# Patient Record
Sex: Female | Born: 1950 | Race: White | Hispanic: No | Marital: Married | Smoking: Current every day smoker
Health system: Southern US, Community
[De-identification: ages and names within clinical notes are randomized; demographics above are authoritative.]

## PROBLEM LIST (undated history)

## (undated) DIAGNOSIS — I809 Phlebitis and thrombophlebitis of unspecified site: Secondary | ICD-10-CM

## (undated) DIAGNOSIS — K4090 Unilateral inguinal hernia, without obstruction or gangrene, not specified as recurrent: Secondary | ICD-10-CM

## (undated) DIAGNOSIS — J449 Chronic obstructive pulmonary disease, unspecified: Secondary | ICD-10-CM

## (undated) DIAGNOSIS — R1011 Right upper quadrant pain: Secondary | ICD-10-CM

## (undated) DIAGNOSIS — Z952 Presence of prosthetic heart valve: Secondary | ICD-10-CM

## (undated) DIAGNOSIS — F419 Anxiety disorder, unspecified: Secondary | ICD-10-CM

## (undated) DIAGNOSIS — I10 Essential (primary) hypertension: Secondary | ICD-10-CM

## (undated) DIAGNOSIS — K760 Fatty (change of) liver, not elsewhere classified: Secondary | ICD-10-CM

## (undated) DIAGNOSIS — D649 Anemia, unspecified: Secondary | ICD-10-CM

## (undated) DIAGNOSIS — R011 Cardiac murmur, unspecified: Secondary | ICD-10-CM

## (undated) DIAGNOSIS — N393 Stress incontinence (female) (male): Secondary | ICD-10-CM

## (undated) DIAGNOSIS — Z720 Tobacco use: Secondary | ICD-10-CM

## (undated) DIAGNOSIS — M1990 Unspecified osteoarthritis, unspecified site: Secondary | ICD-10-CM

## (undated) DIAGNOSIS — I350 Nonrheumatic aortic (valve) stenosis: Secondary | ICD-10-CM

## (undated) DIAGNOSIS — J309 Allergic rhinitis, unspecified: Secondary | ICD-10-CM

## (undated) DIAGNOSIS — I499 Cardiac arrhythmia, unspecified: Secondary | ICD-10-CM

## (undated) HISTORY — PX: KNEE ARTHROSCOPY: SUR90

## (undated) HISTORY — DX: Stress incontinence (female) (male): N39.3

## (undated) HISTORY — PX: LAPAROSCOPIC HYSTERECTOMY: SHX1926

## (undated) HISTORY — DX: Cardiac murmur, unspecified: R01.1

## (undated) HISTORY — DX: Unspecified osteoarthritis, unspecified site: M19.90

## (undated) HISTORY — PX: OTHER SURGICAL HISTORY: SHX169

## (undated) HISTORY — PX: FOOT SURGERY: SHX648

## (undated) HISTORY — DX: Allergic rhinitis, unspecified: J30.9

## (undated) HISTORY — DX: Phlebitis and thrombophlebitis of unspecified site: I80.9

## (undated) HISTORY — PX: VARICOSE VEIN SURGERY: SHX832

## (undated) HISTORY — PX: ABDOMINAL HYSTERECTOMY: SHX81

## (undated) HISTORY — PX: BREAST BIOPSY: SHX20

## (undated) HISTORY — PX: EYE SURGERY: SHX253

---

## 2001-09-01 ENCOUNTER — Encounter (HOSPITAL_COMMUNITY): Admission: RE | Admit: 2001-09-01 | Discharge: 2001-10-01 | Payer: Self-pay | Admitting: *Deleted

## 2001-12-05 ENCOUNTER — Encounter (HOSPITAL_COMMUNITY): Admission: RE | Admit: 2001-12-05 | Discharge: 2002-01-04 | Payer: Self-pay | Admitting: Orthopedic Surgery

## 2002-01-03 ENCOUNTER — Encounter (HOSPITAL_COMMUNITY): Admission: RE | Admit: 2002-01-03 | Discharge: 2002-02-02 | Payer: Self-pay | Admitting: Orthopedic Surgery

## 2002-02-06 ENCOUNTER — Ambulatory Visit (HOSPITAL_COMMUNITY): Admission: RE | Admit: 2002-02-06 | Discharge: 2002-02-06 | Payer: Self-pay | Admitting: Orthopedic Surgery

## 2003-12-16 ENCOUNTER — Ambulatory Visit: Payer: Self-pay | Admitting: Unknown Physician Specialty

## 2003-12-18 ENCOUNTER — Ambulatory Visit: Payer: Self-pay | Admitting: Internal Medicine

## 2004-01-13 ENCOUNTER — Other Ambulatory Visit: Payer: Self-pay

## 2004-01-21 ENCOUNTER — Ambulatory Visit: Payer: Self-pay | Admitting: Obstetrics & Gynecology

## 2015-06-11 ENCOUNTER — Ambulatory Visit (INDEPENDENT_AMBULATORY_CARE_PROVIDER_SITE_OTHER): Payer: Self-pay | Admitting: Family Medicine

## 2015-06-11 ENCOUNTER — Encounter: Payer: Self-pay | Admitting: Family Medicine

## 2015-06-11 VITALS — BP 134/86 | HR 85 | Temp 98.4°F | Ht 67.5 in | Wt 213.0 lb

## 2015-06-11 DIAGNOSIS — Z87891 Personal history of nicotine dependence: Secondary | ICD-10-CM | POA: Insufficient documentation

## 2015-06-11 DIAGNOSIS — L989 Disorder of the skin and subcutaneous tissue, unspecified: Secondary | ICD-10-CM

## 2015-06-11 DIAGNOSIS — R600 Localized edema: Secondary | ICD-10-CM

## 2015-06-11 DIAGNOSIS — R51 Headache: Secondary | ICD-10-CM

## 2015-06-11 DIAGNOSIS — R519 Headache, unspecified: Secondary | ICD-10-CM | POA: Insufficient documentation

## 2015-06-11 DIAGNOSIS — G44229 Chronic tension-type headache, not intractable: Secondary | ICD-10-CM

## 2015-06-11 DIAGNOSIS — R011 Cardiac murmur, unspecified: Secondary | ICD-10-CM

## 2015-06-11 DIAGNOSIS — Z720 Tobacco use: Secondary | ICD-10-CM

## 2015-06-11 DIAGNOSIS — C4492 Squamous cell carcinoma of skin, unspecified: Secondary | ICD-10-CM | POA: Insufficient documentation

## 2015-06-11 DIAGNOSIS — N393 Stress incontinence (female) (male): Secondary | ICD-10-CM

## 2015-06-11 DIAGNOSIS — I872 Venous insufficiency (chronic) (peripheral): Secondary | ICD-10-CM | POA: Insufficient documentation

## 2015-06-11 NOTE — Assessment & Plan Note (Signed)
Reports this is been stable. Asymptomatic. We'll request records from prior physician.

## 2015-06-11 NOTE — Assessment & Plan Note (Signed)
Lesion on left lower extremity were some for actinic keratoses. Discussed dermatology referral if she wants to defer this until she has insurance on July 1. She will return in July for reevaluation and referral.

## 2015-06-11 NOTE — Assessment & Plan Note (Signed)
Minimal issue. Discussed Kegel exercises.

## 2015-06-11 NOTE — Patient Instructions (Signed)
Nice to meet you. The swelling that you get in your legs is likely related to venous insufficiency. You should try to wear compression stockings if you can tolerate. You should also try to prop your legs up as much as possible. He should start doing Kegel exercises for your stress incontinence. Once you get insurance we will refer you to dermatology for evaluation of your skin lesion. We will also obtain lab work when you get insurance. If you develop worsening swelling, pain in her calves, worsening headaches, numbness, weakness, vision changes, or any new or changing symptoms please seek medical attention.

## 2015-06-11 NOTE — Assessment & Plan Note (Signed)
Not interested in quitting. She will cut down on her own.

## 2015-06-11 NOTE — Assessment & Plan Note (Signed)
Chronic tension-type headaches. Neurologically intact. Not worsened recently. No change in headaches. She'll continue to monitor. Given return precautions.

## 2015-06-11 NOTE — Progress Notes (Signed)
Patient ID: Donna Rios, female   DOB: 21-Jun-1950, 65 y.o.   MRN: [id number]  Donna Rumps, MD Phone: [phone]  [name] is a 65 y.o. female who presents today for new patient visit.  Tobacco abuse: Smokes about a pack a day. Has tried quitting in the past gets ulcers in her mouth when she quits. Is not interested in quitting now though is interested in cutting back. Wants to do this on her own. Has tried patches and other nicotine replacement. Not interested in Chantix.  Left lower extremity skin lesion: Patient notes there is been a dry rough patch on the left lower outer aspect of her left leg for the last 1-2 years. Has not grown. Also notes likely seborrheic keratoses on her left back. Occasionally bleeds if irritated. Does place moisturizer on it.  Notes chronic intermittent left foot and ankle swelling following having 7 surgeries on her feet and ankles. Notes goes down with propping her legs up and at night. No orthopnea or PND. No history of DVT. Does have a history of phlebitis and had surgery on her right leg for this. No calf pain or unilateral calf swelling. Does not want further workup of this at this time.  Does report an occasional tension headache or sinus headache. Has had these for many years. No issues since she was recently treated for an upper respiratory infection. Typically gets them late summer due to the heat. No numbness, weakness, or vision changes.  Does report stress incontinence with coughing. Also had recent vaginal discharge relating to antibiotic use and was treated with Diflucan. No vaginal discharge at this time.  Patient additionally has a history of a heart murmur. She has had an echo previously. No lightheadedness, shortness of breath, or chest pain. States it has been stable for over many years.  Active Ambulatory Problems    Diagnosis Date Noted  . Bilateral lower extremity edema 06/11/2015  . Stress incontinence 06/11/2015  . Skin lesion  06/11/2015  . Tobacco abuse 06/11/2015  . Chronic headaches 06/11/2015  . Heart murmur 06/11/2015   Resolved Ambulatory Problems    Diagnosis Date Noted  . No Resolved Ambulatory Problems   Past Medical History  Diagnosis Date  . Arthritis   . Allergic rhinitis   . Phlebitis     Family History  Problem Relation Age of Onset  . Alcoholism    . Arthritis    . Lung cancer    . Heart disease    . Stroke    . Hypertension    . Diabetes      Social History   Social History  . Marital Status: Married    Spouse Name: N/A  . Number of Children: N/A  . Years of Education: N/A   Occupational History  . Not on file.   Social History Main Topics  . Smoking status: Current Every Day Smoker  . Smokeless tobacco: Not on file  . Alcohol Use: No  . Drug Use: No  . Sexual Activity: Not on file   Other Topics Concern  . Not on file   Social History Narrative  . No narrative on file    ROS  General:  Negative for nexplained weight loss, fever Skin: Negative for new or changing mole, sore that won't heal HEENT: Negative for trouble hearing, trouble seeing, ringing in ears, mouth sores, hoarseness, change in voice, dysphagia. CV:  Positive for edema, Negative for chest pain, dyspnea, palpitations Resp: Positive for cough, negative  for dyspnea, hemoptysis GI: Negative for nausea, vomiting, diarrhea, constipation, abdominal pain, melena, hematochezia. GU: Positive for stress incontinence, vaginal discharge, Negative for dysuria, urinary hesitance, hematuria, polyuria, sexual difficulty, lumps in testicle or breasts MSK: Negative for muscle cramps or aches, joint pain or swelling Neuro: Positive for headaches, negative for weakness, numbness, dizziness, passing out/fainting Psych: Negative for depression, anxiety, memory problems  Objective  Physical Exam Filed Vitals:   06/11/15 0926  BP: 134/86  Pulse: 85  Temp: 98.4 F (36.9 C)    BP Readings from Last 3  Encounters:  06/11/15 134/86   Wt Readings from Last 3 Encounters:  06/11/15 213 lb (96.616 kg)    Physical Exam  Constitutional: She is well-developed, well-nourished, and in no distress.  HENT:  Head: Normocephalic and atraumatic.  Right Ear: External ear normal.  Left Ear: External ear normal.  Mouth/Throat: Oropharynx is clear and moist. No oropharyngeal exudate.  Eyes: Conjunctivae are normal. Pupils are equal, round, and reactive to light.  Neck: Neck supple.  Cardiovascular: Normal rate and regular rhythm.   Murmur heard.  Systolic murmur is present with a grade of 3/6   No diastolic murmur is present  Pulmonary/Chest: Effort normal and breath sounds normal.  Abdominal: Soft. Bowel sounds are normal. She exhibits no distension. There is no tenderness. There is no rebound and no guarding.  Musculoskeletal:  Left ankle with 1+ pitting edema, right ankle with trace edema, ankles are nontender, hemosiderin deposition rash noted in bilateral ankles left greater than right No calf tenderness or swelling, no calf cords  Lymphadenopathy:    She has no cervical adenopathy.  Neurological: She is alert. Gait normal.  Skin: Skin is warm and dry. She is not diaphoretic.     Psychiatric: Mood and affect normal.     Assessment/Plan:   Bilateral lower extremity edema Bilateral lower extremity edema left greater than right. Suspect venous insufficiency given characteristic skin changes. Left ankle and foot could be related to prior surgeries. No CHF symptoms. Doubt DVT given bilateral swelling and lack of calf symptoms. Once patient obtains insurance we will proceed with further laboratory workup of this to ensure no other causes. She defers this workup at this time due to cost concerns.  Stress incontinence Minimal issue. Discussed Kegel exercises.  Skin lesion Lesion on left lower extremity were some for actinic keratoses. Discussed dermatology referral if she wants to defer this  until she has insurance on July 1. She will return in July for reevaluation and referral.  Tobacco abuse Not interested in quitting. She will cut down on her own.  Chronic headaches Chronic tension-type headaches. Neurologically intact. Not worsened recently. No change in headaches. She'll continue to monitor. Given return precautions.  Heart murmur Reports this is been stable. Asymptomatic. We'll request records from prior physician.    Donna Rumps, MD [place]

## 2015-06-11 NOTE — Assessment & Plan Note (Signed)
Bilateral lower extremity edema left greater than right. Suspect venous insufficiency given characteristic skin changes. Left ankle and foot could be related to prior surgeries. No CHF symptoms. Doubt DVT given bilateral swelling and lack of calf symptoms. Once patient obtains insurance we will proceed with further laboratory workup of this to ensure no other causes. She defers this workup at this time due to cost concerns.

## 2015-06-11 NOTE — Progress Notes (Signed)
Pre visit review using our clinic review tool, if applicable. No additional management support is needed unless otherwise documented below in the visit note. 

## 2015-08-06 ENCOUNTER — Ambulatory Visit (INDEPENDENT_AMBULATORY_CARE_PROVIDER_SITE_OTHER): Payer: Medicare Other | Admitting: Family Medicine

## 2015-08-06 ENCOUNTER — Encounter: Payer: Self-pay | Admitting: Family Medicine

## 2015-08-06 VITALS — BP 118/66 | HR 87 | Temp 98.2°F | Ht 67.5 in | Wt 208.2 lb

## 2015-08-06 DIAGNOSIS — R600 Localized edema: Secondary | ICD-10-CM | POA: Diagnosis not present

## 2015-08-06 DIAGNOSIS — L989 Disorder of the skin and subcutaneous tissue, unspecified: Secondary | ICD-10-CM | POA: Diagnosis not present

## 2015-08-06 DIAGNOSIS — Z13220 Encounter for screening for lipoid disorders: Secondary | ICD-10-CM | POA: Diagnosis not present

## 2015-08-06 DIAGNOSIS — E669 Obesity, unspecified: Secondary | ICD-10-CM

## 2015-08-06 DIAGNOSIS — J309 Allergic rhinitis, unspecified: Secondary | ICD-10-CM

## 2015-08-06 NOTE — Assessment & Plan Note (Signed)
Unsure of cause. Possibly benign though has had significant sun exposure in the past. We will refer to dermatology.

## 2015-08-06 NOTE — Patient Instructions (Signed)
Nice to see you. We will have a return for fasting lab work to evaluate your edema. We will refer you to dermatology for your skin lesion on her leg. You should add Flonase 2 sprays each nostril daily to your Claritin. She should take Claritin daily. If you develop shortness of breath, cough productive of blood, fevers, or any new or changing symptoms please seek medical attention.

## 2015-08-06 NOTE — Progress Notes (Signed)
Pre visit review using our clinic review tool, if applicable. No additional management support is needed unless otherwise documented below in the visit note. 

## 2015-08-06 NOTE — Assessment & Plan Note (Signed)
Suspect upper respiratory symptoms are related to allergic rhinitis. Doubt bacterial cause given persistence and lack of severe symptoms and fever. We'll treat with Flonase. Can continue Claritin as needed. Advised if Claritin dries her out too much she should let us know we can try an alternative medication. She will continue to monitor.

## 2015-08-06 NOTE — Assessment & Plan Note (Signed)
Suspect related to venous insufficiency given hemosiderin changes. No CHF symptoms. We'll obtain lab work to evaluate further now that she has insurance. She will return for fasting labs. Given return precautions.

## 2015-08-06 NOTE — Progress Notes (Signed)
Patient ID: Donna Rios, female   DOB: Sep 29, 1950, 65 y.o.   MRN: [id number]  [name], MD Phone: [phone]  Donna Rios is a 65 y.o. female who presents today for follow-up.  Patient notes a skin lesion on her left lower outer leg is the same. No pain. It is dry and crusty. Notes she had severe sun exposure when younger.  Bilateral lower extremity edema: Still some. She is watching her salt intake. Gets her feet propped up. No shortness of breath or orthopnea. Needs lab work today.  Recurrent upper respiratory issues: Patient notes persistent sinus pressure and drainage. Notes this is only mild sinus pressure. Some clear drainage out of her right eye as well. Was prescribed eyedrops by another doctor to treat the eye drainage. Green stuff out of her nose. Has been intermittently going on for 3 months. Previously treated with antibiotics. Taking Claritin intermittently. Some cough. Some sneezing.  PMH: Smoker   ROS see history of present illness  Objective  Physical Exam Filed Vitals:   08/06/15 0951  BP: 118/66  Pulse: 87  Temp: 98.2 F (36.8 C)    BP Readings from Last 3 Encounters:  08/06/15 118/66  06/11/15 134/86   Wt Readings from Last 3 Encounters:  08/06/15 208 lb 3.2 oz (94.439 kg)  06/11/15 213 lb (96.616 kg)    Physical Exam  Constitutional: She is well-developed, well-nourished, and in no distress.  HENT:  Head: Normocephalic and atraumatic.  Right Ear: External ear normal.  Left Ear: External ear normal.  Mouth/Throat: Oropharynx is clear and moist. No oropharyngeal exudate.  Normal TMs bilaterally  Eyes: Conjunctivae are normal. Pupils are equal, round, and reactive to light. Right eye exhibits no discharge. Left eye exhibits no discharge.  Cardiovascular: Normal rate, regular rhythm and normal heart sounds.   Trace edema bilaterally  Pulmonary/Chest: Effort normal and breath sounds normal.  Neurological: She is alert. Gait normal.  Skin:  Skin is warm and dry. She is not diaphoretic.  Left lateral lower leg with 1-2 cm raised patch that is crusty and mildly erythematous, nontender, Hemosiderin deposition changes to the skin medially above bilateral ankles worse on the left compared to the right     Assessment/Plan: Please see individual problem list.  Bilateral lower extremity edema Suspect related to venous insufficiency given hemosiderin changes. No CHF symptoms. We'll obtain lab work to evaluate further now that she has insurance. She will return for fasting labs. Given return precautions.  Skin lesion Unsure of cause. Possibly benign though has had significant sun exposure in the past. We will refer to dermatology.  Allergic rhinitis Suspect upper respiratory symptoms are related to allergic rhinitis. Doubt bacterial cause given persistence and lack of severe symptoms and fever. We'll treat with Flonase. Can continue Claritin as needed. Advised if Claritin dries her out too much she should let us know we can try an alternative medication. She will continue to monitor.    Orders Placed This Encounter  Procedures  . Lipid Profile    Standing Status: Future     Number of Occurrences:      Standing Expiration Date: 08/05/2016  . Comp Met (CMET)    Standing Status: Future     Number of Occurrences:      Standing Expiration Date: 08/05/2016  . CBC    Standing Status: Future     Number of Occurrences:      Standing Expiration Date: 08/05/2016  . TSH    Standing Status: Future  Number of Occurrences:      Standing Expiration Date: 08/05/2016  . HgB A1c    Standing Status: Future     Number of Occurrences:      Standing Expiration Date: 08/05/2016  . Ambulatory referral to Dermatology    Referral Priority:  Routine    Referral Type:  Consultation    Referral Reason:  Specialty Services Required    Requested Specialty:  Dermatology    Number of Visits Requested:  1    [name], MD [place]

## 2015-08-08 ENCOUNTER — Other Ambulatory Visit (INDEPENDENT_AMBULATORY_CARE_PROVIDER_SITE_OTHER): Payer: Medicare Other

## 2015-08-08 DIAGNOSIS — R600 Localized edema: Secondary | ICD-10-CM | POA: Diagnosis not present

## 2015-08-08 DIAGNOSIS — E669 Obesity, unspecified: Secondary | ICD-10-CM

## 2015-08-08 DIAGNOSIS — Z13220 Encounter for screening for lipoid disorders: Secondary | ICD-10-CM

## 2015-08-08 LAB — CBC
HCT: 39.2 % (ref 36.0–46.0)
Hemoglobin: 13.2 g/dL (ref 12.0–15.0)
MCHC: 33.6 g/dL (ref 30.0–36.0)
MCV: 90.7 fl (ref 78.0–100.0)
Platelets: 265 10*3/uL (ref 150.0–400.0)
RBC: 4.32 Mil/uL (ref 3.87–5.11)
RDW: 12.9 % (ref 11.5–15.5)
WBC: 10.9 10*3/uL — ABNORMAL HIGH (ref 4.0–10.5)

## 2015-08-08 LAB — COMPREHENSIVE METABOLIC PANEL
ALK PHOS: 103 U/L (ref 39–117)
ALT: 15 U/L (ref 0–35)
AST: 16 U/L (ref 0–37)
Albumin: 4 g/dL (ref 3.5–5.2)
BILIRUBIN TOTAL: 0.3 mg/dL (ref 0.2–1.2)
BUN: 14 mg/dL (ref 6–23)
CO2: 27 mEq/L (ref 19–32)
CREATININE: 0.72 mg/dL (ref 0.40–1.20)
Calcium: 9.6 mg/dL (ref 8.4–10.5)
Chloride: 101 mEq/L (ref 96–112)
GFR: 86.42 mL/min (ref >60.00)
GLUCOSE: 101 mg/dL — AB (ref 70–99)
Potassium: 4.1 mEq/L (ref 3.5–5.1)
SODIUM: 137 meq/L (ref 135–145)
TOTAL PROTEIN: 7.1 g/dL (ref 6.0–8.3)

## 2015-08-08 LAB — LIPID PANEL
Cholesterol: 267 mg/dL — ABNORMAL HIGH (ref 0–200)
HDL: 45.4 mg/dL (ref >39.00)
NONHDL: 221.79
TRIGLYCERIDES: 308 mg/dL — AB (ref 0.0–149.0)
Total CHOL/HDL Ratio: 6
VLDL: 61.6 mg/dL — ABNORMAL HIGH (ref 0.0–40.0)

## 2015-08-08 LAB — HEMOGLOBIN A1C: Hgb A1c MFr Bld: 5.9 % (ref 4.6–6.5)

## 2015-08-08 LAB — LDL CHOLESTEROL, DIRECT: LDL DIRECT: 187 mg/dL

## 2015-08-08 LAB — TSH: TSH: 1.44 u[IU]/mL (ref 0.35–4.50)

## 2015-08-13 ENCOUNTER — Telehealth: Payer: Self-pay | Admitting: Family Medicine

## 2015-08-13 NOTE — Telephone Encounter (Signed)
She will need evaluation in the office to determine if she requires an antibiotic. Thanks.

## 2015-08-13 NOTE — Telephone Encounter (Signed)
Please advise 

## 2015-08-13 NOTE — Telephone Encounter (Signed)
Patient needs an appt schedule for treatment, thanks

## 2015-08-13 NOTE — Telephone Encounter (Signed)
The patient called to inform the physician that she would like to do diet and exercise to control her cholesterol and she will need another round of antibiotics for her upper respiratory infection . She informed me that she has a low grade temp and the Flonase and Claritin helps with her cough , but she thinks that the URI is the cause for her elevated white cell count.  If the physician calls an antibiotic to the pharmacy she stated that she will need something for yeast.

## 2015-08-15 ENCOUNTER — Ambulatory Visit (INDEPENDENT_AMBULATORY_CARE_PROVIDER_SITE_OTHER): Payer: Medicare Other | Admitting: Family Medicine

## 2015-08-15 ENCOUNTER — Encounter: Payer: Self-pay | Admitting: Family Medicine

## 2015-08-15 VITALS — BP 122/72 | HR 77 | Temp 98.5°F | Wt 208.0 lb

## 2015-08-15 DIAGNOSIS — D72829 Elevated white blood cell count, unspecified: Secondary | ICD-10-CM | POA: Diagnosis not present

## 2015-08-15 DIAGNOSIS — R350 Frequency of micturition: Secondary | ICD-10-CM | POA: Diagnosis not present

## 2015-08-15 DIAGNOSIS — J309 Allergic rhinitis, unspecified: Secondary | ICD-10-CM

## 2015-08-15 LAB — POCT URINALYSIS DIPSTICK
Bilirubin, UA: NEGATIVE
Glucose, UA: NEGATIVE
KETONES UA: NEGATIVE
Leukocytes, UA: NEGATIVE
Nitrite, UA: NEGATIVE
PH UA: 7
Protein, UA: NEGATIVE
SPEC GRAV UA: 1.01
Urobilinogen, UA: 0.2

## 2015-08-15 LAB — URINALYSIS, MICROSCOPIC ONLY

## 2015-08-15 LAB — CBC
HEMATOCRIT: 40 % (ref 36.0–46.0)
Hemoglobin: 13.4 g/dL (ref 12.0–15.0)
MCHC: 33.6 g/dL (ref 30.0–36.0)
MCV: 90.9 fl (ref 78.0–100.0)
Platelets: 282 10*3/uL (ref 150.0–400.0)
RBC: 4.4 Mil/uL (ref 3.87–5.11)
RDW: 13.1 % (ref 11.5–15.5)
WBC: 10.4 10*3/uL (ref 4.0–10.5)

## 2015-08-15 NOTE — Assessment & Plan Note (Signed)
I still suspect patient's upper respiratory symptoms are likely related to allergic rhinitis. They have improved with Flonase and she has not been taking Claritin consistently. Discussed taking Flonase on a full stomach to see if that will help with the nausea. She had a benign abdominal exam. Benign upper respiratory exam. Benign lung exam. We will recheck her CBC given her leukocytosis. If white blood cell count is more elevated could consider treating with antibiotics for possible bacterial component. She will continue to monitor her symptoms and take Claritin daily. In return precautions.

## 2015-08-15 NOTE — Progress Notes (Signed)
  Donna Rumps, MD Phone: [phone]  Donna Rios is a 65 y.o. female who presents today for follow-up.  Patient notes cough has improved. Minimal phlegm. Some rhinorrhea that this is improved. Congestion is improved. Sore throat is resolved. States temperatures run as high as 61F and she feels this is a fever for her. She notes the Flonase has made her nauseous after using it. No vomiting or diarrhea. No abdominal pain. She reports having had 2 rounds of antibiotics previously. Had minimal leukocytosis on last check. We will recheck that today. At the end of the visit she reports that she has maybe had some increased urinary frequency and urgency and wonders if she needs her urine checked. No dysuria.  ROS see history of present illness  Objective  Physical Exam Filed Vitals:   08/15/15 1043  BP: 122/72  Pulse: 77  Temp: 98.5 F (36.9 C)    BP Readings from Last 3 Encounters:  08/15/15 122/72  08/06/15 118/66  06/11/15 134/86   Wt Readings from Last 3 Encounters:  08/15/15 208 lb (94.348 kg)  08/06/15 208 lb 3.2 oz (94.439 kg)  06/11/15 213 lb (96.616 kg)    Physical Exam  Constitutional: She is well-developed, well-nourished, and in no distress.  HENT:  Head: Normocephalic and atraumatic.  Right Ear: External ear normal.  Left Ear: External ear normal.  Mouth/Throat: Oropharynx is clear and moist. No oropharyngeal exudate.  Normal TMs bilaterally  Eyes: Conjunctivae are normal. Pupils are equal, round, and reactive to light.  Cardiovascular: Normal rate and regular rhythm.   Pulmonary/Chest: Effort normal and breath sounds normal.  Abdominal: Soft. Bowel sounds are normal. She exhibits no distension. There is no tenderness. There is no rebound and no guarding.  Neurological: She is alert. Gait normal.  Skin: Skin is warm and dry. She is not diaphoretic.     Assessment/Plan: Please see individual problem list.  Allergic rhinitis I still suspect patient's  upper respiratory symptoms are likely related to allergic rhinitis. They have improved with Flonase and she has not been taking Claritin consistently. Discussed taking Flonase on a full stomach to see if that will help with the nausea. She had a benign abdominal exam. Benign upper respiratory exam. Benign lung exam. We will recheck her CBC given her leukocytosis. If white blood cell count is more elevated could consider treating with antibiotics for possible bacterial component. She will continue to monitor her symptoms and take Claritin daily. In return precautions.  Urine frequency Patient with possible increased urinary frequency recently. We will check a UA and call her with the results. If signs of infection would consider antibiotic therapy.    Orders Placed This Encounter  Procedures  . CBC  . POCT Urinalysis Dipstick    Donna Rumps, MD [place]

## 2015-08-15 NOTE — Patient Instructions (Signed)
Nice to see you. I would ask that you eat immediately before using her Flonase to see if this also with the nausea. Please take Claritin daily. If you develop fevers, worsening congestion, abdominal pain, vomiting, diarrhea, or any new or changing symptoms please seek medical attention.

## 2015-08-15 NOTE — Addendum Note (Signed)
Addended by: Frutoso Chase A on: 08/15/2015 02:23 PM   Modules accepted: Orders

## 2015-08-15 NOTE — Assessment & Plan Note (Signed)
Patient with possible increased urinary frequency recently. We will check a UA and call her with the results. If signs of infection would consider antibiotic therapy.

## 2015-08-17 LAB — URINE CULTURE: Organism ID, Bacteria: 10000

## 2015-09-17 DIAGNOSIS — D489 Neoplasm of uncertain behavior, unspecified: Secondary | ICD-10-CM | POA: Diagnosis not present

## 2015-09-17 DIAGNOSIS — D485 Neoplasm of uncertain behavior of skin: Secondary | ICD-10-CM | POA: Diagnosis not present

## 2015-09-17 DIAGNOSIS — I781 Nevus, non-neoplastic: Secondary | ICD-10-CM | POA: Diagnosis not present

## 2015-09-17 DIAGNOSIS — L821 Other seborrheic keratosis: Secondary | ICD-10-CM | POA: Diagnosis not present

## 2015-09-17 DIAGNOSIS — L988 Other specified disorders of the skin and subcutaneous tissue: Secondary | ICD-10-CM | POA: Diagnosis not present

## 2015-11-06 ENCOUNTER — Ambulatory Visit (INDEPENDENT_AMBULATORY_CARE_PROVIDER_SITE_OTHER): Payer: Medicare Other | Admitting: Family Medicine

## 2015-11-06 ENCOUNTER — Encounter: Payer: Self-pay | Admitting: Family Medicine

## 2015-11-06 DIAGNOSIS — C44729 Squamous cell carcinoma of skin of left lower limb, including hip: Secondary | ICD-10-CM

## 2015-11-06 DIAGNOSIS — R600 Localized edema: Secondary | ICD-10-CM

## 2015-11-06 DIAGNOSIS — Z23 Encounter for immunization: Secondary | ICD-10-CM | POA: Diagnosis not present

## 2015-11-06 DIAGNOSIS — R7303 Prediabetes: Secondary | ICD-10-CM | POA: Diagnosis not present

## 2015-11-06 NOTE — Progress Notes (Signed)
Pre visit review using our clinic review tool, if applicable. No additional management support is needed unless otherwise documented below in the visit note. 

## 2015-11-06 NOTE — Patient Instructions (Signed)
Nice to see you. Please monitor the biopsy site on your left leg. If you develop increasing redness, warmth, tenderness, fevers, chills, or any new symptoms please let us and your dermatologist know. Please continue to work on diet and exercise. You should prop your legs up to help with the swelling.

## 2015-11-06 NOTE — Progress Notes (Signed)
  [name], MD Phone: [phone]  Donna Rios is a 65 y.o. female who presents today for follow-up.  Squamous cell carcinoma of the skin: Patient found to have this on her left lower leg at the site of concern from previous visit. Notes they removed it. They're doing fluorouracil for this. Notes some discomfort at the site. Some irritation. No spreading redness. No warmth. No fevers. She sees them in late November for follow-up.  Prediabetes: Patient notes no polyuria or polydipsia. She's walking lots. She does try to contain her diet better. Eating less sweets.  Venous insufficiency: Patient notes still gets some swelling in her legs. Notes it does improve with propping her legs up. Also worsens with salty food intake. No shortness of breath or orthopnea.  PMH: Smoker   ROS see history of present illness  Objective  Physical Exam Vitals:   11/06/15 0910  BP: 128/84  Pulse: 78  Temp: 98.5 F (36.9 C)    BP Readings from Last 3 Encounters:  11/06/15 128/84  08/15/15 122/72  08/06/15 118/66   Wt Readings from Last 3 Encounters:  11/06/15 206 lb 9.6 oz (93.7 kg)  08/15/15 208 lb (94.3 kg)  08/06/15 208 lb 3.2 oz (94.4 kg)    Physical Exam  Constitutional: She is well-developed, well-nourished, and in no distress.  Cardiovascular: Normal rate, regular rhythm and normal heart sounds.   Pulmonary/Chest: Effort normal and breath sounds normal.  Neurological: She is alert. Gait normal.  Skin: Skin is warm and dry.  Left lateral lower leg with nickel to quarter-sized area of biopsy with scab and cream on it, there is some mild erythema immediately surrounding this with no tenderness or induration     Assessment/Plan: Please see individual problem list.  Squamous cell carcinoma of skin Found on recent biopsy. She's currently completing fluorouracil treatment. She'll finish this and follow-up with dermatology as scheduled. Advised to monitor for signs of infection  and if these occur she'll seek medical attention.  Venous insufficiency Swelling in her legs likely related to venous insufficiency given hemosiderin changes. No CHF symptoms. Lab work previously with no cause. Advised on propping her legs up. Given open lesion on leg compression stockings are not a good idea. She'll continue to monitor at this time.  Prediabetes A1c 5.9 on last check. Asymptomatic. Advised on continued diet and exercise.   Orders Placed This Encounter  Procedures  . Flu vaccine HIGH DOSE PF    [name], MD [place]

## 2015-11-06 NOTE — Assessment & Plan Note (Signed)
Swelling in her legs likely related to venous insufficiency given hemosiderin changes. No CHF symptoms. Lab work previously with no cause. Advised on propping her legs up. Given open lesion on leg compression stockings are not a good idea. She'll continue to monitor at this time.

## 2015-11-06 NOTE — Assessment & Plan Note (Addendum)
Found on recent biopsy. She's currently completing fluorouracil treatment. She'll finish this and follow-up with dermatology as scheduled. Advised to monitor for signs of infection and if these occur she'll seek medical attention.

## 2015-11-06 NOTE — Assessment & Plan Note (Signed)
A1c 5.9 on last check. Asymptomatic. Advised on continued diet and exercise.

## 2015-12-10 ENCOUNTER — Telehealth: Payer: Self-pay | Admitting: *Deleted

## 2015-12-10 NOTE — Telephone Encounter (Signed)
Spoke with patient and she stated that the site where the dermatology did the procedure has got red with some heat in it. She stated that she has had some pain at the sight, but ibuprofen relieves the pain. There is no drainage at the site. I have offered the patient an appointment for today, but patient has stated that she is in [place] so can not come in. I have scheduled her for tomorrow at 8:30 AM with Dr. [name]. I advised patient that she should be seen today to have the site evaluated per Dr. [name]. Patient stated that she would rather just come see Dr. [name] tomorrow. I advised that if it continues to get worse she would need to be evaluated before then. I explained that there is [facility] offices in [facility] and patient stated that her husband is in [place] and would rather wait til in the morning.

## 2015-12-10 NOTE — Telephone Encounter (Signed)
Agree with this. I advised CMA to tell the patient she should be evaluated today.

## 2015-12-10 NOTE — Telephone Encounter (Signed)
Pt was advised to call the office if her left leg was not improving, She requested a call to discuss the issue . Pt contact [phone]

## 2015-12-11 ENCOUNTER — Ambulatory Visit (INDEPENDENT_AMBULATORY_CARE_PROVIDER_SITE_OTHER): Payer: Medicare Other | Admitting: Family Medicine

## 2015-12-11 ENCOUNTER — Encounter: Payer: Self-pay | Admitting: Family Medicine

## 2015-12-11 VITALS — BP 120/84 | HR 82 | Temp 98.4°F | Wt 211.2 lb

## 2015-12-11 DIAGNOSIS — L03116 Cellulitis of left lower limb: Secondary | ICD-10-CM

## 2015-12-11 DIAGNOSIS — J020 Streptococcal pharyngitis: Secondary | ICD-10-CM

## 2015-12-11 DIAGNOSIS — J309 Allergic rhinitis, unspecified: Secondary | ICD-10-CM | POA: Diagnosis not present

## 2015-12-11 LAB — POCT RAPID STREP A (OFFICE): Rapid Strep A Screen: NEGATIVE

## 2015-12-11 MED ORDER — DOXYCYCLINE HYCLATE 100 MG PO TABS: 100.0000 mg | ORAL_TABLET | Freq: Two times a day (BID) | ORAL | 0 refills | Status: DC

## 2015-12-11 NOTE — Assessment & Plan Note (Signed)
Suspect upper respiratory symptoms are related to allergic rhinitis versus viral illness. Doubt bacterial cause. Rapid strep test today given recent exposure. Discussed continuing current allergy medications and continuing to monitor.

## 2015-12-11 NOTE — Patient Instructions (Signed)
Nice to see you.  You likely have cellulitis. We will start you on doxycycline for this. Please take a probiotic with this. Please continue your allergy medicines for your upper respiratory symptoms. If you develop spreading redness, increasing pain, fevers, chills, or any new or changing symptoms please seek medical attention. If you have any worsening in your cellulitis prior to following up on Monday you need to seek medical attention immediately.

## 2015-12-11 NOTE — Assessment & Plan Note (Signed)
Patient with cellulitis of left lower leg. Biopsy site appears well healing with no purulent drainage. There is no fluctuance to indicate abscess. We will start on doxycycline to cover for cellulitis. She will follow-up on Monday for recheck. She is advised to seek medical attention if there is any worsening or changes in her symptoms prior to Monday. Given return precautions.

## 2015-12-11 NOTE — Progress Notes (Signed)
  [name], MD Phone: [phone]  Donna Rios is a 65 y.o. female who presents today for same day visit.  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 She notes over the last several days she's developed redness around the distal aspect of the biopsy that has spread distally. Notes there is pain and warmth to the area. No purulent drainage. She's been taking ibuprofen with relief of her discomfort. MAXIMUM TEMPERATURE 19F. No chills.  Patient additionally notes sore throat last night with some postnasal drip and rhinorrhea. Some mild cough. She has a history of allergies. She's been using her allergy medication. She notes a sick contact with strep throat.  PMH: Smoker   ROS see history of present illness  Objective  Physical Exam Vitals:   12/11/15 0818  BP: 120/84  Pulse: 82  Temp: 98.4 F (36.9 C)    BP Readings from Last 3 Encounters:  12/11/15 120/84  11/06/15 128/84  08/15/15 122/72   Wt Readings from Last 3 Encounters:  12/11/15 211 lb 3.2 oz (95.8 kg)  11/06/15 206 lb 9.6 oz (93.7 kg)  08/15/15 208 lb (94.3 kg)    Physical Exam  Constitutional: She is well-developed, well-nourished, and in no distress.  HENT:  Head: Normocephalic and atraumatic.  Mouth/Throat: Oropharynx is clear and moist. No oropharyngeal exudate.  Normal TMs bilaterally  Eyes: Conjunctivae are normal. Pupils are equal, round, and reactive to light.  Cardiovascular: Normal rate and regular rhythm.  Exam reveals no gallop and no friction rub.   Murmur (3/6) heard. Pulmonary/Chest: Effort normal and breath sounds normal.  Musculoskeletal: She exhibits no edema.  Neurological: She is alert. Gait normal.  Skin: Skin is warm and dry.        Assessment/Plan: Please see individual problem list.  Allergic rhinitis Suspect upper respiratory symptoms are related to allergic rhinitis versus viral illness. Doubt bacterial cause. Rapid  strep test today given recent exposure. Discussed continuing current allergy medications and continuing to monitor.  Cellulitis of leg, left Patient with cellulitis of left lower leg. Biopsy site appears well healing with no purulent drainage. There is no fluctuance to indicate abscess. We will start on doxycycline to cover for cellulitis. She will follow-up on Monday for recheck. She is advised to seek medical attention if there is any worsening or changes in her symptoms prior to Monday. Given return precautions.  Strep negative. Orders Placed This Encounter  Procedures  . POCT rapid strep A    Meds ordered this encounter  Medications  . doxycycline (VIBRA-TABS) 100 MG tablet    Sig: Take 1 tablet (100 mg total) by mouth 2 (two) times daily.    Dispense:  14 tablet    Refill:  0    [name], MD [place]

## 2015-12-11 NOTE — Progress Notes (Signed)
Pre visit review using our clinic review tool, if applicable. No additional management support is needed unless otherwise documented below in the visit note. 

## 2015-12-15 ENCOUNTER — Ambulatory Visit (INDEPENDENT_AMBULATORY_CARE_PROVIDER_SITE_OTHER): Payer: Medicare Other | Admitting: Family Medicine

## 2015-12-15 ENCOUNTER — Encounter: Payer: Self-pay | Admitting: Family Medicine

## 2015-12-15 DIAGNOSIS — L03116 Cellulitis of left lower limb: Secondary | ICD-10-CM | POA: Diagnosis not present

## 2015-12-15 MED ORDER — FLUCONAZOLE 150 MG PO TABS: 150.0000 mg | ORAL_TABLET | ORAL | 0 refills | Status: DC

## 2015-12-15 NOTE — Progress Notes (Signed)
  [name], MD Phone: [phone]  Donna Rios is a 65 y.o. female who presents today for f/u.  Cellulitis: Patient reports this is improved. Erythema much improved. The area of erythema is not tender anymore. She notes chills last night though states she spent a fair amount of time outside walking and has had no other chills. No documented fevers. No sweats. She feels well today. She is on day 5 of doxycycline. She reports she typically she gets a yeast infection with antibiotics though does not have any symptoms now. She would like Diflucan sent to her pharmacy. She reports using Vaseline on her biopsy wound. She does report that there is a odor to the wound at times. No purulent drainage.   ROS see history of present illness  Objective  Physical Exam Vitals:   12/15/15 0935  BP: 132/86  Pulse: 85  Temp: 98 F (36.7 C)    BP Readings from Last 3 Encounters:  12/15/15 132/86  12/11/15 120/84  11/06/15 128/84   Wt Readings from Last 3 Encounters:  12/15/15 211 lb 12.8 oz (96.1 kg)  12/11/15 211 lb 3.2 oz (95.8 kg)  11/06/15 206 lb 9.6 oz (93.7 kg)    Physical Exam  Constitutional: No distress.  Cardiovascular: Normal rate, regular rhythm and normal heart sounds.   Pulmonary/Chest: Effort normal and breath sounds normal.  Musculoskeletal: She exhibits no edema.  Skin: She is not diaphoretic.      no odor to the wound noted.   Assessment/Plan: Please see individual problem list.  Cellulitis of leg, left Improving with antibiotics. Vital signs stable. She will complete her course of doxycycline. We will provide her with Diflucan to take in case she gets a yeast infection. Discussed yogurt and a probiotic to prevent diarrhea though she reports that the medication insert advised not to use these things with doxycycline. She will not use probiotics and yogurt at her request. If she develops worsening symptoms of cellulitis or any systemic symptoms she'll seek  medical attention. She is given return precautions.   No orders of the defined types were placed in this encounter.   Meds ordered this encounter  Medications  . fluconazole (DIFLUCAN) 150 MG tablet    Sig: Take 1 tablet (150 mg total) by mouth every 3 (three) days.    Dispense:  2 tablet    Refill:  0   [name], MD [place]

## 2015-12-15 NOTE — Progress Notes (Signed)
Pre visit review using our clinic review tool, if applicable. No additional management support is needed unless otherwise documented below in the visit note. 

## 2015-12-15 NOTE — Patient Instructions (Signed)
Nice to see you. We will have you finish your course of antibiotics. Your cellulitis appears to be improving. If you develop spreading redness, increased pain, swelling, fevers, chills, sweats, or any new or changing symptoms please seek medical attention immediately.

## 2015-12-15 NOTE — Assessment & Plan Note (Signed)
Improving with antibiotics. Vital signs stable. She will complete her course of doxycycline. We will provide her with Diflucan to take in case she gets a yeast infection. Discussed yogurt and a probiotic to prevent diarrhea though she reports that the medication insert advised not to use these things with doxycycline. She will not use probiotics and yogurt at her request. If she develops worsening symptoms of cellulitis or any systemic symptoms she'll seek medical attention. She is given return precautions.

## 2015-12-24 DIAGNOSIS — L298 Other pruritus: Secondary | ICD-10-CM | POA: Diagnosis not present

## 2015-12-24 DIAGNOSIS — R238 Other skin changes: Secondary | ICD-10-CM | POA: Diagnosis not present

## 2015-12-24 DIAGNOSIS — L570 Actinic keratosis: Secondary | ICD-10-CM | POA: Diagnosis not present

## 2015-12-24 DIAGNOSIS — Z85828 Personal history of other malignant neoplasm of skin: Secondary | ICD-10-CM | POA: Diagnosis not present

## 2016-02-25 DIAGNOSIS — Z85828 Personal history of other malignant neoplasm of skin: Secondary | ICD-10-CM | POA: Diagnosis not present

## 2016-02-25 DIAGNOSIS — L905 Scar conditions and fibrosis of skin: Secondary | ICD-10-CM | POA: Diagnosis not present

## 2016-04-27 LAB — COLOGUARD: COLOGUARD: NEGATIVE

## 2016-04-29 ENCOUNTER — Telehealth: Payer: Self-pay | Admitting: Family Medicine

## 2016-04-29 NOTE — Telephone Encounter (Signed)
Left pt message asking to call Donna Rios back directly at [phone] to schedule Welcome to [facility] exam. Thanks!

## 2016-05-06 ENCOUNTER — Encounter: Payer: Self-pay | Admitting: Family Medicine

## 2016-05-06 ENCOUNTER — Ambulatory Visit (INDEPENDENT_AMBULATORY_CARE_PROVIDER_SITE_OTHER): Payer: Medicare Other

## 2016-05-06 ENCOUNTER — Ambulatory Visit (INDEPENDENT_AMBULATORY_CARE_PROVIDER_SITE_OTHER): Payer: Medicare Other | Admitting: Family Medicine

## 2016-05-06 VITALS — BP 132/82 | HR 81 | Temp 98.5°F | Wt 213.8 lb

## 2016-05-06 DIAGNOSIS — Z13220 Encounter for screening for lipoid disorders: Secondary | ICD-10-CM

## 2016-05-06 DIAGNOSIS — G8929 Other chronic pain: Secondary | ICD-10-CM

## 2016-05-06 DIAGNOSIS — R351 Nocturia: Secondary | ICD-10-CM

## 2016-05-06 DIAGNOSIS — Z13820 Encounter for screening for osteoporosis: Secondary | ICD-10-CM

## 2016-05-06 DIAGNOSIS — Z0000 Encounter for general adult medical examination without abnormal findings: Secondary | ICD-10-CM | POA: Diagnosis not present

## 2016-05-06 DIAGNOSIS — Z1159 Encounter for screening for other viral diseases: Secondary | ICD-10-CM

## 2016-05-06 DIAGNOSIS — Z1231 Encounter for screening mammogram for malignant neoplasm of breast: Secondary | ICD-10-CM

## 2016-05-06 DIAGNOSIS — M25561 Pain in right knee: Secondary | ICD-10-CM

## 2016-05-06 DIAGNOSIS — Z720 Tobacco use: Secondary | ICD-10-CM | POA: Diagnosis not present

## 2016-05-06 DIAGNOSIS — M1711 Unilateral primary osteoarthritis, right knee: Secondary | ICD-10-CM | POA: Diagnosis not present

## 2016-05-06 DIAGNOSIS — Z1239 Encounter for other screening for malignant neoplasm of breast: Secondary | ICD-10-CM

## 2016-05-06 LAB — LDL CHOLESTEROL, DIRECT: LDL DIRECT: 168 mg/dL

## 2016-05-06 LAB — POCT URINALYSIS DIPSTICK
Bilirubin, UA: NEGATIVE
Glucose, UA: NEGATIVE
KETONES UA: NEGATIVE
LEUKOCYTES UA: NEGATIVE
Nitrite, UA: NEGATIVE
PH UA: 7 (ref 5.0–8.0)
Protein, UA: NEGATIVE
Spec Grav, UA: 1.01 (ref 1.010–1.025)
Urobilinogen, UA: 0.2 E.U./dL

## 2016-05-06 LAB — BASIC METABOLIC PANEL WITH GFR
BUN: 12 mg/dL (ref 7–25)
CALCIUM: 9.6 mg/dL (ref 8.6–10.4)
CHLORIDE: 103 mmol/L (ref 98–110)
CO2: 28 mmol/L (ref 20–31)
CREATININE: 0.75 mg/dL (ref 0.50–0.99)
GFR, Est Non African American: 84 mL/min (ref >=60)
Glucose, Bld: 96 mg/dL (ref 65–99)
Potassium: 4.9 mmol/L (ref 3.5–5.3)
Sodium: 139 mmol/L (ref 135–146)

## 2016-05-06 LAB — HEPATIC FUNCTION PANEL
ALT: 12 U/L (ref 0–35)
AST: 13 U/L (ref 0–37)
Albumin: 4.4 g/dL (ref 3.5–5.2)
Alkaline Phosphatase: 96 U/L (ref 39–117)
BILIRUBIN DIRECT: 0.1 mg/dL (ref 0.0–0.3)
Total Bilirubin: 0.4 mg/dL (ref 0.2–1.2)
Total Protein: 7 g/dL (ref 6.0–8.3)

## 2016-05-06 LAB — LIPID PANEL
CHOLESTEROL: 257 mg/dL — AB (ref 0–200)
HDL: 46.6 mg/dL (ref >39.00)
NonHDL: 210.1
TRIGLYCERIDES: 343 mg/dL — AB (ref 0.0–149.0)
Total CHOL/HDL Ratio: 6
VLDL: 68.6 mg/dL — AB (ref 0.0–40.0)

## 2016-05-06 LAB — URINALYSIS, MICROSCOPIC ONLY

## 2016-05-06 LAB — HEMOGLOBIN A1C: HEMOGLOBIN A1C: 5.9 % (ref 4.6–6.5)

## 2016-05-06 LAB — HEPATITIS C ANTIBODY: HCV Ab: NEGATIVE

## 2016-05-06 MED ORDER — PNEUMOCOCCAL 13-VAL CONJ VACC IM SUSP: 0.5000 mL | Freq: Once | INTRAMUSCULAR | 0 refills | Status: AC

## 2016-05-06 MED ORDER — TETANUS-DIPHTH-ACELL PERTUSSIS 5-2.5-18.5 LF-MCG/0.5 IM SUSP: 0.5000 mL | Freq: Once | INTRAMUSCULAR | 0 refills | Status: AC

## 2016-05-06 NOTE — Progress Notes (Addendum)
Subjective:    Donna Rios is a 66 y.o. female who presents for a Welcome to Medicare exam.   Risk factors for cardiac disease include postmenopausal female at age 82.  Patient additionally notes right knee pain that has been chronic but worsened somewhat recently. She did a deep knee bend to look under the bed and has had some discomfort in her knees with going up stairs and doing deep knee bend since then. Has a history of patella surgery for realignment and bone spurs in her knees.  Patient has chronic swelling in her feet. This is venous insufficiency related. She had cough that has improved. She reports nocturia 2 times nightly and also some increased thirst compared to previously. She has a history of prediabetes though no diabetes history. She has decreased fatty food intake. Does still eat some sweets. She plans to start exercising possibly through Medicare provided exercise program. She reports chronic history of migraine headaches. Had a slight headache this morning though this has gone away. In the past she has had blurred vision and aura with them. They resolve on their own typically. No numbness or weakness. No headache at this time. She reports some anxiety related to March being the month of her parents death. She notes no depression.  Review of Systems  General:  Negative for nexplained weight loss, fever Skin: Negative for new or changing mole, sore that won't heal HEENT: Positive for trouble seeing, Negative for trouble hearing, ringing in ears, mouth sores, hoarseness, change in voice, dysphagia. CV:  Positive for edema in feet, Negative for chest pain, dyspnea, palpitations Resp: Positive for cough that has improved, negative for dyspnea, hemoptysis GI: Negative for nausea, vomiting, diarrhea, constipation, abdominal pain, melena, hematochezia. GU: Positive for frequent urination, Negative for dysuria, incontinence, urinary hesitance, hematuria, vaginal or penile  discharge, polyuria, sexual difficulty, lumps in testicle or breasts MSK: Positive for muscle cramps or aches, joint pain or swelling Neuro: Positive for headaches, negative for weakness, numbness, dizziness, passing out/fainting Psych: Positive for anxiety, Negative for depression, memory problems         Objective:    Today's Vitals   05/06/16 0912  BP: 132/82  Pulse: 81  Temp: 98.5 F (36.9 C)  TempSrc: Oral  SpO2: 97%  Weight: 213 lb 12.8 oz (97 kg)  Body mass index is 32.99 kg/m.  Medications Outpatient Encounter Prescriptions as of 05/06/2016  Medication Sig  . fluticasone (FLONASE) 50 MCG/ACT nasal spray Place 1 spray into both nostrils daily.  Marland Kitchen loratadine (CLARITIN) 10 MG tablet Take 10 mg by mouth daily.  . [DISCONTINUED] fluorouracil (EFUDEX) 5 % cream Apply topically.  . [DISCONTINUED] ibuprofen (ADVIL,MOTRIN) 200 MG tablet Take 200 mg by mouth every 6 (six) hours as needed.  . pneumococcal 13-valent conjugate vaccine (PREVNAR 13) SUSP injection Inject 0.5 mLs into the muscle once.  . Tdap (BOOSTRIX) 5-2.5-18.5 LF-MCG/0.5 injection Inject 0.5 mLs into the muscle once.  . [DISCONTINUED] doxycycline (VIBRA-TABS) 100 MG tablet Take 1 tablet (100 mg total) by mouth 2 (two) times daily.  . [DISCONTINUED] fluconazole (DIFLUCAN) 150 MG tablet Take 1 tablet (150 mg total) by mouth every 3 (three) days.   No facility-administered encounter medications on file as of 05/06/2016.      History: Past Medical History:  Diagnosis Date  . Allergic rhinitis   . Arthritis   . Heart murmur   . Phlebitis   . Stress incontinence    Past Surgical History:  Procedure Laterality Date  .  Cataract surgery    . FOOT SURGERY     7  . LAPAROSCOPIC HYSTERECTOMY    . VARICOSE VEIN SURGERY      Family History  Problem Relation Age of Onset  . Alcoholism    . Arthritis    . Lung cancer    . Heart disease    . Stroke    . Hypertension    . Diabetes     Social History    Occupational History  . Not on file.   Social History Main Topics  . Smoking status: Current Every Day Smoker  . Smokeless tobacco: Never Used  . Alcohol use No  . Drug use: No  . Sexual activity: Not on file    Tobacco Counseling Ready to quit: No Counseling given: Yes   Immunizations and Health Maintenance Immunization History  Administered Date(s) Administered  . Influenza, High Dose Seasonal PF 11/06/2015   Health Maintenance Due  Topic Date Due  . Hepatitis C Screening  [birth year]  . TETANUS/TDAP  08/23/1969  . MAMMOGRAM  08/23/2000  . COLONOSCOPY  08/23/2000  . DEXA SCAN  08/24/2015  . PNA vac Low Risk Adult (1 of 2 - PCV13) 08/24/2015    Activities of Daily Living In your present state of health, do you have any difficulty performing the following activities: 05/06/2016  Hearing? N  Vision? Y  Difficulty concentrating or making decisions? N  Walking or climbing stairs? Y  Dressing or bathing? N  Some recent data might be hidden    Physical Exam  Physical Exam  Constitutional: She is well-developed, well-nourished, and in no distress.  HENT:  Head: Normocephalic and atraumatic.  Mouth/Throat: Oropharynx is clear and moist. No oropharyngeal exudate.  Eyes: Conjunctivae are normal. Pupils are equal, round, and reactive to light.  Cardiovascular: Normal rate, regular rhythm and normal heart sounds.   Pulmonary/Chest: Effort normal and breath sounds normal.  Abdominal: Soft. Bowel sounds are normal. She exhibits no distension. There is no tenderness. There is no rebound and no guarding.  Genitourinary:  Genitourinary Comments: Pelvic deferred given hysterectomy for non-cancerous reasons  Musculoskeletal: She exhibits no edema.  Neurological: She is alert.  CN 2-12 intact, 5/5 strength in bilateral biceps, triceps, grip, quads, hamstrings, plantar and dorsiflexion, sensation to light touch intact in bilateral UE and LE, normal gait  Skin: Skin is warm and  dry.  Psychiatric:  Mood anxious, affect normal     Advanced Directives: Patient does not have an advanced directive in place. She was given information on this and will complete this prior to her next visit.   Assessment:    This is a routine wellness examination for this patient . Overall patient is doing well. I did discuss diet and exercise with her. We will obtain a right knee film for her right knee pain. She does have chronic headaches and I did discuss if she has any change to these or any new symptoms she should be evaluated. She has chronic swelling in her feet which is related to venous insufficiency. Anxiety is related to the time of year as this is when her parents died. Discussed monitoring this. Vision was checked and was reassuring. Hearing intact to finger rub. We'll check lab work as outlined in the order sectioned. Osteoporosis screening, lung cancer screening, and mammography ordered. We'll send a message to [facility] to set up the CT scan of her chest. Prescriptions given for Prevnar and tetanus vaccinations.  Vision/Hearing screen  Visual Acuity  Screening   Right eye Left eye Both eyes  Without correction: 20/30 20/30 20/20   With correction:     Hearing Screening Comments: Intact to finger rubHearing intact to finger rub.  Dietary issues and exercise activities discussed:  Discussed adding low intensity exercise. Also discussed decreasing sweet intake.   Depression Screen PHQ 2/9 Scores 05/06/2016  PHQ - 2 Score 0     Fall Risk Fall Risk  05/06/2016  Falls in the past year? No    Cognitive Function: Appears intact and she is alert and oriented. Also normal score on MMSE.  Patient Care Team: [name], MD as PCP - General (Family Medicine)     Plan:     During the course of the visit the patient was educated and counseled about the following appropriate screening and preventive services:   Vaccines to include Pneumoccal, Influenza,  Tdap  Electrocardiogram  Cardiovascular Disease  Colorectal cancer screening  Bone density screening  Diabetes screening  Mammography/PAP  Nutrition counseling  Her current medications and allergies were reviewed and needed refills of her chronic medications were ordered. The plan for yearly health maintenance was discussed and all orders and referrals were made as appropriate.  Patient Instructions were given to the patient.   [name], MD 05/06/2016

## 2016-05-06 NOTE — Progress Notes (Signed)
Pre visit review using our clinic review tool, if applicable. No additional management support is needed unless otherwise documented below in the visit note. 

## 2016-05-06 NOTE — Patient Instructions (Signed)
Health Maintenance, Female Adopting a healthy lifestyle and getting preventive care can go a long way to promote health and wellness. Talk with your health care provider about what schedule of regular examinations is right for you. This is a good chance for you to check in with your provider about disease prevention and staying healthy. In between checkups, there are plenty of things you can do on your own. Experts have done a lot of research about which lifestyle changes and preventive measures are most likely to keep you healthy. Ask your health care provider for more information. Weight and diet Eat a healthy diet  Be sure to include plenty of vegetables, fruits, low-fat dairy products, and lean protein.  Do not eat a lot of foods high in solid fats, added sugars, or salt.  Get regular exercise. This is one of the most important things you can do for your health.  Most adults should exercise for at least 150 minutes each week. The exercise should increase your heart rate and make you sweat (moderate-intensity exercise).  Most adults should also do strengthening exercises at least twice a week. This is in addition to the moderate-intensity exercise. Maintain a healthy weight  Body mass index (BMI) is a measurement that can be used to identify possible weight problems. It estimates body fat based on height and weight. Your health care provider can help determine your BMI and help you achieve or maintain a healthy weight.  For females 62 years of age and older:  A BMI below 18.5 is considered underweight.  A BMI of 18.5 to 24.9 is normal.  A BMI of 25 to 29.9 is considered overweight.  A BMI of 30 and above is considered obese. Watch levels of cholesterol and blood lipids  You should start having your blood tested for lipids and cholesterol at 66 years of age, then have this test every 5 years.  You may need to have your cholesterol levels checked more often if:  Your lipid or  cholesterol levels are high.  You are older than 66 years of age.  You are at high risk for heart disease. Cancer screening Lung Cancer  Lung cancer screening is recommended for adults 59-21 years old who are at high risk for lung cancer because of a history of smoking.  A yearly low-dose CT scan of the lungs is recommended for people who:  Currently smoke.  Have quit within the past 15 years.  Have at least a 30-pack-year history of smoking. A pack year is smoking an average of one pack of cigarettes a day for 1 year.  Yearly screening should continue until it has been 15 years since you quit.  Yearly screening should stop if you develop a health problem that would prevent you from having lung cancer treatment. Breast Cancer  Practice breast self-awareness. This means understanding how your breasts normally appear and feel.  It also means doing regular breast self-exams. Let your health care provider know about any changes, no matter how small.  If you are in your 20s or 30s, you should have a clinical breast exam (CBE) by a health care provider every 1-3 years as part of a regular health exam.  If you are 25 or older, have a CBE every year. Also consider having a breast X-ray (mammogram) every year.  If you have a family history of breast cancer, talk to your health care provider about genetic screening.  If you are at high risk for breast cancer, talk  to your health care provider about having an MRI and a mammogram every year.  Breast cancer gene (BRCA) assessment is recommended for women who have family members with BRCA-related cancers. BRCA-related cancers include:  Breast.  Ovarian.  Tubal.  Peritoneal cancers.  Results of the assessment will determine the need for genetic counseling and BRCA1 and BRCA2 testing. Cervical Cancer  Your health care provider may recommend that you be screened regularly for cancer of the pelvic organs (ovaries, uterus, and vagina).  This screening involves a pelvic examination, including checking for microscopic changes to the surface of your cervix (Pap test). You may be encouraged to have this screening done every 3 years, beginning at age 24.  For women ages 66-65, health care providers may recommend pelvic exams and Pap testing every 3 years, or they may recommend the Pap and pelvic exam, combined with testing for human papilloma virus (HPV), every 5 years. Some types of HPV increase your risk of cervical cancer. Testing for HPV may also be done on women of any age with unclear Pap test results.  Other health care providers may not recommend any screening for nonpregnant women who are considered low risk for pelvic cancer and who do not have symptoms. Ask your health care provider if a screening pelvic exam is right for you.  If you have had past treatment for cervical cancer or a condition that could lead to cancer, you need Pap tests and screening for cancer for at least 20 years after your treatment. If Pap tests have been discontinued, your risk factors (such as having a new sexual partner) need to be reassessed to determine if screening should resume. Some women have medical problems that increase the chance of getting cervical cancer. In these cases, your health care provider may recommend more frequent screening and Pap tests. Colorectal Cancer  This type of cancer can be detected and often prevented.  Routine colorectal cancer screening usually begins at 66 years of age and continues through 66 years of age.  Your health care provider may recommend screening at an earlier age if you have risk factors for colon cancer.  Your health care provider may also recommend using home test kits to check for hidden blood in the stool.  A small camera at the end of a tube can be used to examine your colon directly (sigmoidoscopy or colonoscopy). This is done to check for the earliest forms of colorectal cancer.  Routine  screening usually begins at age 41.  Direct examination of the colon should be repeated every 5-10 years through 66 years of age. However, you may need to be screened more often if early forms of precancerous polyps or small growths are found. Skin Cancer  Check your skin from head to toe regularly.  Tell your health care provider about any new moles or changes in moles, especially if there is a change in a mole's shape or color.  Also tell your health care provider if you have a mole that is larger than the size of a pencil eraser.  Always use sunscreen. Apply sunscreen liberally and repeatedly throughout the day.  Protect yourself by wearing long sleeves, pants, a wide-brimmed hat, and sunglasses whenever you are outside. Heart disease, diabetes, and high blood pressure  High blood pressure causes heart disease and increases the risk of stroke. High blood pressure is more likely to develop in:  People who have blood pressure in the high end of the normal range (130-139/85-89 mm Hg).  People who are overweight or obese.  People who are African American.  If you are 59-24 years of age, have your blood pressure checked every 3-5 years. If you are 34 years of age or older, have your blood pressure checked every year. You should have your blood pressure measured twice-once when you are at a hospital or clinic, and once when you are not at a hospital or clinic. Record the average of the two measurements. To check your blood pressure when you are not at a hospital or clinic, you can use:  An automated blood pressure machine at a pharmacy.  A home blood pressure monitor.  If you are between 29 years and 60 years old, ask your health care provider if you should take aspirin to prevent strokes.  Have regular diabetes screenings. This involves taking a blood sample to check your fasting blood sugar level.  If you are at a normal weight and have a low risk for diabetes, have this test once  every three years after 66 years of age.  If you are overweight and have a high risk for diabetes, consider being tested at a younger age or more often. Preventing infection Hepatitis B  If you have a higher risk for hepatitis B, you should be screened for this virus. You are considered at high risk for hepatitis B if:  You were born in a country where hepatitis B is common. Ask your health care provider which countries are considered high risk.  Your parents were born in a high-risk country, and you have not been immunized against hepatitis B (hepatitis B vaccine).  You have HIV or AIDS.  You use needles to inject street drugs.  You live with someone who has hepatitis B.  You have had sex with someone who has hepatitis B.  You get hemodialysis treatment.  You take certain medicines for conditions, including cancer, organ transplantation, and autoimmune conditions. Hepatitis C  Blood testing is recommended for:  Everyone born from [birth year] through 1965.  Anyone with known risk factors for hepatitis C. Sexually transmitted infections (STIs)  You should be screened for sexually transmitted infections (STIs) including gonorrhea and chlamydia if:  You are sexually active and are younger than 66 years of age.  You are older than 66 years of age and your health care provider tells you that you are at risk for this type of infection.  Your sexual activity has changed since you were last screened and you are at an increased risk for chlamydia or gonorrhea. Ask your health care provider if you are at risk.  If you do not have HIV, but are at risk, it may be recommended that you take a prescription medicine daily to prevent HIV infection. This is called pre-exposure prophylaxis (PrEP). You are considered at risk if:  You are sexually active and do not regularly use condoms or know the HIV status of your partner(s).  You take drugs by injection.  You are sexually active with a partner  who has HIV. Talk with your health care provider about whether you are at high risk of being infected with HIV. If you choose to begin PrEP, you should first be tested for HIV. You should then be tested every 3 months for as long as you are taking PrEP. Pregnancy  If you are premenopausal and you may become pregnant, ask your health care provider about preconception counseling.  If you may become pregnant, take 400 to 800 micrograms (mcg) of folic acid  every day.  If you want to prevent pregnancy, talk to your health care provider about birth control (contraception). Osteoporosis and menopause  Osteoporosis is a disease in which the bones lose minerals and strength with aging. This can result in serious bone fractures. Your risk for osteoporosis can be identified using a bone density scan.  If you are 47 years of age or older, or if you are at risk for osteoporosis and fractures, ask your health care provider if you should be screened.  Ask your health care provider whether you should take a calcium or vitamin D supplement to lower your risk for osteoporosis.  Menopause may have certain physical symptoms and risks.  Hormone replacement therapy may reduce some of these symptoms and risks. Talk to your health care provider about whether hormone replacement therapy is right for you. Follow these instructions at home:  Schedule regular health, dental, and eye exams.  Stay current with your immunizations.  Do not use any tobacco products including cigarettes, chewing tobacco, or electronic cigarettes.  If you are pregnant, do not drink alcohol.  If you are breastfeeding, limit how much and how often you drink alcohol.  Limit alcohol intake to no more than 1 drink per day for nonpregnant women. One drink equals 12 ounces of beer, 5 ounces of wine, or 1 ounces of hard liquor.  Do not use street drugs.  Do not share needles.  Ask your health care provider for help if you need support  or information about quitting drugs.  Tell your health care provider if you often feel depressed.  Tell your health care provider if you have ever been abused or do not feel safe at home. This information is not intended to replace advice given to you by your health care provider. Make sure you discuss any questions you have with your health care provider. Document Released: 07/27/2010 Document Revised: 06/19/2015 Document Reviewed: 10/15/2014 Elsevier Interactive Patient Education  2017 [employer].   Steps to Quit Smoking Smoking tobacco can be harmful to your health and can affect almost every organ in your body. Smoking puts you, and those around you, at risk for developing many serious chronic diseases. Quitting smoking is difficult, but it is one of the best things that you can do for your health. It is never too late to quit. What are the benefits of quitting smoking? When you quit smoking, you lower your risk of developing serious diseases and conditions, such as:  Lung cancer or lung disease, such as COPD.  Heart disease.  Stroke.  Heart attack.  Infertility.  Osteoporosis and bone fractures. Additionally, symptoms such as coughing, wheezing, and shortness of breath may get better when you quit. You may also find that you get sick less often because your body is stronger at fighting off colds and infections. If you are pregnant, quitting smoking can help to reduce your chances of having a baby of low birth weight. How do I get ready to quit? When you decide to quit smoking, create a plan to make sure that you are successful. Before you quit:  Pick a date to quit. Set a date within the next two weeks to give you time to prepare.  Write down the reasons why you are quitting. Keep this list in places where you will see it often, such as on your bathroom mirror or in your car or wallet.  Identify the people, places, things, and activities that make you want to smoke  (triggers) and  avoid them. Make sure to take these actions:  Throw away all cigarettes at home, at work, and in your car.  Throw away smoking accessories, such as Scientist, research (medical).  Clean your car and make sure to empty the ashtray.  Clean your home, including curtains and carpets.  Tell your family, friends, and coworkers that you are quitting. Support from your loved ones can make quitting easier.  Talk with your health care provider about your options for quitting smoking.  Find out what treatment options are covered by your health insurance. What strategies can I use to quit smoking? Talk with your healthcare provider about different strategies to quit smoking. Some strategies include:  Quitting smoking altogether instead of gradually lessening how much you smoke over a period of time. Research shows that quitting "cold [place]" is more successful than gradually quitting.  Attending in-person counseling to help you build problem-solving skills. You are more likely to have success in quitting if you attend several counseling sessions. Even short sessions of 10 minutes can be effective.  Finding resources and support systems that can help you to quit smoking and remain smoke-free after you quit. These resources are most helpful when you use them often. They can include:  Online chats with a Social worker.  Telephone quitlines.  Printed Furniture conservator/restorer.  Support groups or group counseling.  Text messaging programs.  Mobile phone applications.  Taking medicines to help you quit smoking. (If you are pregnant or breastfeeding, talk with your health care provider first.) Some medicines contain nicotine and some do not. Both types of medicines help with cravings, but the medicines that include nicotine help to relieve withdrawal symptoms. Your health care provider may recommend:  Nicotine patches, gum, or lozenges.  Nicotine inhalers or sprays.  Non-nicotine medicine that is  taken by mouth. Talk with your health care provider about combining strategies, such as taking medicines while you are also receiving in-person counseling. Using these two strategies together makes you more likely to succeed in quitting than if you used either strategy on its own. If you are pregnant or breastfeeding, talk with your health care provider about finding counseling or other support strategies to quit smoking. Do not take medicine to help you quit smoking unless told to do so by your health care provider. What things can I do to make it easier to quit? Quitting smoking might feel overwhelming at first, but there is a lot that you can do to make it easier. Take these important actions:  Reach out to your family and friends and ask that they support and encourage you during this time. Call telephone quitlines, reach out to support groups, or work with a counselor for support.  Ask people who smoke to avoid smoking around you.  Avoid places that trigger you to smoke, such as bars, parties, or smoke-break areas at work.  Spend time around people who do not smoke.  Lessen stress in your life, because stress can be a smoking trigger for some people. To lessen stress, try:  Exercising regularly.  Deep-breathing exercises.  Yoga.  Meditating.  Performing a body scan. This involves closing your eyes, scanning your body from head to toe, and noticing which parts of your body are particularly tense. Purposefully relax the muscles in those areas.  Download or purchase mobile phone or tablet apps (applications) that can help you stick to your quit plan by providing reminders, tips, and encouragement. There are many free apps, such as QuitGuide from the CDC (  Centers for Disease Control and Prevention). You can find other support for quitting smoking (smoking cessation) through smokefree.gov and other websites. How will I feel when I quit smoking? Within the first 24 hours of quitting  smoking, you may start to feel some withdrawal symptoms. These symptoms are usually most noticeable 2-3 days after quitting, but they usually do not last beyond 2-3 weeks. Changes or symptoms that you might experience include:  Mood swings.  Restlessness, anxiety, or irritation.  Difficulty concentrating.  Dizziness.  Strong cravings for sugary foods in addition to nicotine.  Mild weight gain.  Constipation.  Nausea.  Coughing or a sore throat.  Changes in how your medicines work in your body.  A depressed mood.  Difficulty sleeping (insomnia). After the first 2-3 weeks of quitting, you may start to notice more positive results, such as:  Improved sense of smell and taste.  Decreased coughing and sore throat.  Slower heart rate.  Lower blood pressure.  Clearer skin.  The ability to breathe more easily.  Fewer sick days. Quitting smoking is very challenging for most people. Do not get discouraged if you are not successful the first time. Some people need to make many attempts to quit before they achieve long-term success. Do your best to stick to your quit plan, and talk with your health care provider if you have any questions or concerns. This information is not intended to replace advice given to you by your health care provider. Make sure you discuss any questions you have with your health care provider. Document Released: 01/05/2001 Document Revised: 09/09/2015 Document Reviewed: 05/28/2014 Elsevier Interactive Patient Education  2017 [employer].

## 2016-05-07 ENCOUNTER — Other Ambulatory Visit: Payer: Self-pay | Admitting: Family Medicine

## 2016-05-07 DIAGNOSIS — E785 Hyperlipidemia, unspecified: Secondary | ICD-10-CM | POA: Insufficient documentation

## 2016-05-07 DIAGNOSIS — M1712 Unilateral primary osteoarthritis, left knee: Secondary | ICD-10-CM

## 2016-05-07 LAB — URINE CULTURE

## 2016-05-07 MED ORDER — ROSUVASTATIN CALCIUM 20 MG PO TABS: 20.0000 mg | ORAL_TABLET | Freq: Every day | ORAL | 3 refills | Status: DC

## 2016-05-10 ENCOUNTER — Encounter: Payer: Self-pay | Admitting: Family Medicine

## 2016-05-12 ENCOUNTER — Telehealth: Payer: Self-pay | Admitting: *Deleted

## 2016-05-12 DIAGNOSIS — Z87891 Personal history of nicotine dependence: Secondary | ICD-10-CM

## 2016-05-12 NOTE — Telephone Encounter (Signed)
Received referral for initial lung cancer screening scan. Contacted patient and obtained smoking history,(current 46 pack year) as well as answering questions related to screening process. Patient denies signs of lung cancer such as weight loss or hemoptysis. Patient denies comorbidity that would prevent curative treatment if lung cancer were found. Patient is tentatively scheduled for shared decision making visit and CT scan on 05/18/16, pending insurance approval from business office.

## 2016-05-12 NOTE — Telephone Encounter (Signed)
Received referral for low dose lung cancer screening CT scan. Voicemail left at phone number listed in EMR for patient to call me back to facilitate scheduling scan.  

## 2016-05-13 DIAGNOSIS — M1711 Unilateral primary osteoarthritis, right knee: Secondary | ICD-10-CM | POA: Diagnosis not present

## 2016-05-17 ENCOUNTER — Telehealth: Payer: Self-pay | Admitting: Family Medicine

## 2016-05-18 ENCOUNTER — Inpatient Hospital Stay: Payer: Medicare Other | Admitting: Oncology

## 2016-05-18 ENCOUNTER — Ambulatory Visit: Payer: Medicare Other

## 2016-05-24 ENCOUNTER — Other Ambulatory Visit: Payer: Self-pay | Admitting: *Deleted

## 2016-05-24 DIAGNOSIS — Z87891 Personal history of nicotine dependence: Secondary | ICD-10-CM

## 2016-05-25 ENCOUNTER — Ambulatory Visit: Payer: Medicare Other

## 2016-05-25 ENCOUNTER — Encounter: Payer: Self-pay | Admitting: Oncology

## 2016-05-25 ENCOUNTER — Ambulatory Visit
Admission: RE | Admit: 2016-05-25 | Discharge: 2016-05-25 | Disposition: A | Discharge status: Home / Self Care | Payer: Medicare Other | Source: Ambulatory Visit | Attending: Oncology | Admitting: Oncology

## 2016-05-25 ENCOUNTER — Inpatient Hospital Stay: Payer: Medicare Other | Attending: Oncology | Admitting: Oncology

## 2016-05-25 DIAGNOSIS — J439 Emphysema, unspecified: Secondary | ICD-10-CM | POA: Diagnosis not present

## 2016-05-25 DIAGNOSIS — Z122 Encounter for screening for malignant neoplasm of respiratory organs: Secondary | ICD-10-CM

## 2016-05-25 DIAGNOSIS — I700 Atherosclerosis of aorta: Secondary | ICD-10-CM | POA: Diagnosis not present

## 2016-05-25 DIAGNOSIS — Z87891 Personal history of nicotine dependence: Secondary | ICD-10-CM

## 2016-05-26 DIAGNOSIS — Z87891 Personal history of nicotine dependence: Secondary | ICD-10-CM | POA: Insufficient documentation

## 2016-05-26 NOTE — Progress Notes (Signed)
In accordance with CMS guidelines, patient has met eligibility criteria including age, absence of signs or symptoms of lung cancer.  Social History  Substance Use Topics  . Smoking status: Current Every Day Smoker    Packs/day: 1.00    Years: 46.00  . Smokeless tobacco: Never Used  . Alcohol use No     A shared decision-making session was conducted prior to the performance of CT scan. This includes one or more decision aids, includes benefits and harms of screening, follow-up diagnostic testing, over-diagnosis, false positive rate, and total radiation exposure.  Counseling on the importance of adherence to annual lung cancer LDCT screening, impact of co-morbidities, and ability or willingness to undergo diagnosis and treatment is imperative for compliance of the program.  Counseling on the importance of continued smoking cessation for former smokers; the importance of smoking cessation for current smokers, and information about tobacco cessation interventions have been given to patient including [place] and [facility] quit [place] programs.  Written order for lung cancer screening with LDCT has been given to the patient and any and all questions have been answered to the best of my abilities.   Yearly follow up will be coordinated by Burgess Estelle, Thoracic Navigator.

## 2016-05-27 DIAGNOSIS — M25661 Stiffness of right knee, not elsewhere classified: Secondary | ICD-10-CM | POA: Diagnosis not present

## 2016-05-27 DIAGNOSIS — M1711 Unilateral primary osteoarthritis, right knee: Secondary | ICD-10-CM | POA: Diagnosis not present

## 2016-05-27 DIAGNOSIS — M25561 Pain in right knee: Secondary | ICD-10-CM | POA: Diagnosis not present

## 2016-05-27 DIAGNOSIS — M25461 Effusion, right knee: Secondary | ICD-10-CM | POA: Diagnosis not present

## 2016-05-28 ENCOUNTER — Encounter: Payer: Self-pay | Admitting: *Deleted

## 2016-05-28 NOTE — Telephone Encounter (Signed)
Scheduled 06/25/16

## 2016-06-10 ENCOUNTER — Encounter: Payer: Self-pay | Admitting: Family Medicine

## 2016-06-10 ENCOUNTER — Other Ambulatory Visit: Payer: Medicare Other

## 2016-06-10 DIAGNOSIS — M1711 Unilateral primary osteoarthritis, right knee: Secondary | ICD-10-CM | POA: Diagnosis not present

## 2016-06-11 ENCOUNTER — Other Ambulatory Visit (INDEPENDENT_AMBULATORY_CARE_PROVIDER_SITE_OTHER): Payer: Medicare Other

## 2016-06-11 ENCOUNTER — Telehealth: Payer: Self-pay | Admitting: *Deleted

## 2016-06-11 DIAGNOSIS — E785 Hyperlipidemia, unspecified: Secondary | ICD-10-CM | POA: Diagnosis not present

## 2016-06-11 LAB — HEPATIC FUNCTION PANEL
ALT: 13 U/L (ref 0–35)
AST: 15 U/L (ref 0–37)
Albumin: 4.3 g/dL (ref 3.5–5.2)
Alkaline Phosphatase: 79 U/L (ref 39–117)
BILIRUBIN TOTAL: 0.5 mg/dL (ref 0.2–1.2)
Bilirubin, Direct: 0.1 mg/dL (ref 0.0–0.3)
Total Protein: 7 g/dL (ref 6.0–8.3)

## 2016-06-11 LAB — LDL CHOLESTEROL, DIRECT: LDL DIRECT: 69 mg/dL

## 2016-06-11 NOTE — Telephone Encounter (Signed)
Will patient need her 06/25/16 appt.for welcometo medicare , pt stated that Dr.Sonnenberg has taken care of this appt in April .  If this can be cancelled, I will call pt to cancel. Pt contact [phone]  A message can be left on voicemail

## 2016-06-11 NOTE — Telephone Encounter (Signed)
notified

## 2016-06-11 NOTE — Telephone Encounter (Signed)
She does not need another one please cancel

## 2016-06-15 DIAGNOSIS — Z85828 Personal history of other malignant neoplasm of skin: Secondary | ICD-10-CM | POA: Diagnosis not present

## 2016-06-15 DIAGNOSIS — L905 Scar conditions and fibrosis of skin: Secondary | ICD-10-CM | POA: Diagnosis not present

## 2016-06-15 DIAGNOSIS — L814 Other melanin hyperpigmentation: Secondary | ICD-10-CM | POA: Diagnosis not present

## 2016-06-22 ENCOUNTER — Ambulatory Visit
Admission: RE | Admit: 2016-06-22 | Discharge: 2016-06-22 | Disposition: A | Discharge status: Home / Self Care | Payer: Medicare Other | Source: Ambulatory Visit | Attending: Family Medicine | Admitting: Family Medicine

## 2016-06-22 DIAGNOSIS — Z1239 Encounter for other screening for malignant neoplasm of breast: Secondary | ICD-10-CM

## 2016-06-22 DIAGNOSIS — Z1231 Encounter for screening mammogram for malignant neoplasm of breast: Secondary | ICD-10-CM | POA: Insufficient documentation

## 2016-06-25 ENCOUNTER — Encounter: Payer: Medicare Other | Admitting: Family Medicine

## 2016-12-13 ENCOUNTER — Ambulatory Visit (INDEPENDENT_AMBULATORY_CARE_PROVIDER_SITE_OTHER)
Admission: RE | Admit: 2016-12-13 | Discharge: 2016-12-13 | Disposition: A | Discharge status: Home / Self Care | Payer: Medicare Other | Source: Ambulatory Visit | Attending: Family Medicine | Admitting: Family Medicine

## 2016-12-13 ENCOUNTER — Encounter: Payer: Self-pay | Admitting: Family Medicine

## 2016-12-13 ENCOUNTER — Ambulatory Visit (INDEPENDENT_AMBULATORY_CARE_PROVIDER_SITE_OTHER): Payer: Medicare Other | Admitting: Family Medicine

## 2016-12-13 VITALS — BP 152/90 | HR 83 | Temp 97.7°F | Wt 218.0 lb

## 2016-12-13 DIAGNOSIS — S99922D Unspecified injury of left foot, subsequent encounter: Secondary | ICD-10-CM

## 2016-12-13 DIAGNOSIS — M545 Low back pain, unspecified: Secondary | ICD-10-CM

## 2016-12-13 DIAGNOSIS — M79672 Pain in left foot: Secondary | ICD-10-CM | POA: Diagnosis not present

## 2016-12-13 DIAGNOSIS — M7989 Other specified soft tissue disorders: Secondary | ICD-10-CM | POA: Diagnosis not present

## 2016-12-13 DIAGNOSIS — S99922A Unspecified injury of left foot, initial encounter: Secondary | ICD-10-CM | POA: Diagnosis not present

## 2016-12-13 NOTE — Progress Notes (Signed)
Subjective:    Patient ID: Donna Rios, female    DOB: October 31, 1950, 66 y.o.   MRN: [id number]  HPI This is a 66 yo female who presents today with left foot pain and bruising following a fall 3 days ago. She slipped on some ice and her right foot went forward and her left foot went back. Did not hit her head, no LOC. Low back pain, worse on left, no sciatica. Has been taking acetaminophen and using [facility]. Has history of bunion and hammer toe surgery. Wore a post op boot for two days, but made her calf hurt. Takes meloxicam daily.  Stomach gurgling last night, went 2 days without BM, had normal BM yesterday. She is able to ambulate with her cane and has slept ok. Just wants to have everything checked out.   Past Medical History:  Diagnosis Date  . Allergic rhinitis   . Arthritis   . Heart murmur   . Phlebitis   . Stress incontinence    Past Surgical History:  Procedure Laterality Date  . BREAST BIOPSY Left    ducts removed  . Cataract surgery    . FOOT SURGERY     7  . LAPAROSCOPIC HYSTERECTOMY    . VARICOSE VEIN SURGERY     Family History  Problem Relation Age of Onset  . Alcoholism Unknown   . Arthritis Unknown   . Lung cancer Unknown   . Heart disease Unknown   . Stroke Unknown   . Hypertension Unknown   . Diabetes Unknown    Social History   Tobacco Use  . Smoking status: Current Every Day Smoker    Packs/day: 1.00    Years: 46.00    Pack years: 46.00  . Smokeless tobacco: Never Used  Substance Use Topics  . Alcohol use: No    Alcohol/week: 0.0 oz  . Drug use: No      Review of Systems  Respiratory: Negative for cough.   Cardiovascular: Negative for chest pain and leg swelling.  Gastrointestinal: Positive for constipation (two days ago). Negative for abdominal pain (gurgling, no pain), nausea and vomiting.  Musculoskeletal: Positive for arthralgias (foot pain) and back pain.       Objective:   Physical Exam  Constitutional: She is oriented to  person, place, and time. She appears well-developed and well-nourished. No distress.  HENT:  Head: Normocephalic and atraumatic.  Eyes: Conjunctivae are normal.  Cardiovascular: Normal rate.  Pulmonary/Chest: Effort normal.  Musculoskeletal:       Left ankle: She exhibits no swelling and no ecchymosis. Achilles tendon exhibits no pain and no defect.       Lumbar back: She exhibits tenderness (left paraspinal, small resolving bruise). She exhibits normal range of motion, no swelling and no edema.       Left foot: There is decreased range of motion (great toe), tenderness (accross top, with flexion) and deformity (3,4 toes appear chronically misshapen).  Neurological: She is alert and oriented to person, place, and time.  Skin: Skin is warm and dry. She is not diaphoretic.  Psychiatric: Her behavior is normal. Judgment and thought content normal.  Vitals reviewed.     BP (!) 152/90 (BP Location: Right Arm, Patient Position: Standing, Cuff Size: Normal)   Pulse 83   Temp 97.7 F (36.5 C) (Oral)   Wt 218 lb (98.9 kg)   SpO2 97%   BMI 33.15 kg/m  Wt Readings from Last 3 Encounters:  12/13/16 218 lb (98.9 kg)  05/25/16 211 lb (95.7 kg)  05/06/16 213 lb 12.8 oz (97 kg)       Assessment & Plan:  1. Foot injury, left, initial encounter - given history of surgery and pain following fall, will get xray - encouraged her to keep elevated when sitting - DG Foot 2 Views Left; Future  2. Acute left-sided low back pain without sciatica - acute on chronic, continue Meloxicam - she has done exercises with PT in past, encouraged her to resume, provided some written exercises to do - RTC precautions reveiwed   Clarene Reamer, FNP-BC  [facility], [place]  12/15/2016 6:15 AM

## 2016-12-13 NOTE — Patient Instructions (Signed)
I'll notify you of xray results  Back Exercises If you have pain in your back, do these exercises 2-3 times each day or as told by your doctor. When the pain goes away, do the exercises once each day, but repeat the steps more times for each exercise (do more repetitions). If you do not have pain in your back, do these exercises once each day or as told by your doctor. Exercises Single Knee to Chest  Do these steps 3-5 times in a row for each leg: 1. Lie on your back on a firm bed or the floor with your legs stretched out. 2. Bring one knee to your chest. 3. Hold your knee to your chest by grabbing your knee or thigh. 4. Pull on your knee until you feel a gentle stretch in your lower back. 5. Keep doing the stretch for 10-30 seconds. 6. Slowly let go of your leg and straighten it.  Pelvic Tilt  Do these steps 5-10 times in a row: 1. Lie on your back on a firm bed or the floor with your legs stretched out. 2. Bend your knees so they point up to the ceiling. Your feet should be flat on the floor. 3. Tighten your lower belly (abdomen) muscles to press your lower back against the floor. This will make your tailbone point up to the ceiling instead of pointing down to your feet or the floor. 4. Stay in this position for 5-10 seconds while you gently tighten your muscles and breathe evenly.  Cat-Cow  Do these steps until your lower back bends more easily: 1. Get on your hands and knees on a firm surface. Keep your hands under your shoulders, and keep your knees under your hips. You may put padding under your knees. 2. Let your head hang down, and make your tailbone point down to the floor so your lower back is round like the back of a cat. 3. Stay in this position for 5 seconds. 4. Slowly lift your head and make your tailbone point up to the ceiling so your back hangs low (sags) like the back of a cow. 5. Stay in this position for 5 seconds.  Press-Ups  Do these steps 5-10 times in a  row: 1. Lie on your belly (face-down) on the floor. 2. Place your hands near your head, about shoulder-width apart. 3. While you keep your back relaxed and keep your hips on the floor, slowly straighten your arms to raise the top half of your body and lift your shoulders. Do not use your back muscles. To make yourself more comfortable, you may change where you place your hands. 4. Stay in this position for 5 seconds. 5. Slowly return to lying flat on the floor.  Bridges  Do these steps 10 times in a row: 1. Lie on your back on a firm surface. 2. Bend your knees so they point up to the ceiling. Your feet should be flat on the floor. 3. Tighten your butt muscles and lift your butt off of the floor until your waist is almost as high as your knees. If you do not feel the muscles working in your butt and the back of your thighs, slide your feet 1-2 inches farther away from your butt. 4. Stay in this position for 3-5 seconds. 5. Slowly lower your butt to the floor, and let your butt muscles relax.  If this exercise is too easy, try doing it with your arms crossed over your chest. Belly Crunches  Do these steps 5-10 times in a row: 1. Lie on your back on a firm bed or the floor with your legs stretched out. 2. Bend your knees so they point up to the ceiling. Your feet should be flat on the floor. 3. Cross your arms over your chest. 4. Tip your chin a little bit toward your chest but do not bend your neck. 5. Tighten your belly muscles and slowly raise your chest just enough to lift your shoulder blades a tiny bit off of the floor. 6. Slowly lower your chest and your head to the floor.  Back Lifts Do these steps 5-10 times in a row: 1. Lie on your belly (face-down) with your arms at your sides, and rest your forehead on the floor. 2. Tighten the muscles in your legs and your butt. 3. Slowly lift your chest off of the floor while you keep your hips on the floor. Keep the back of your head in  line with the curve in your back. Look at the floor while you do this. 4. Stay in this position for 3-5 seconds. 5. Slowly lower your chest and your face to the floor.  Contact a doctor if:  Your back pain gets a lot worse when you do an exercise.  Your back pain does not lessen 2 hours after you exercise. If you have any of these problems, stop doing the exercises. Do not do them again unless your doctor says it is okay. Get help right away if:  You have sudden, very bad back pain. If this happens, stop doing the exercises. Do not do them again unless your doctor says it is okay. This information is not intended to replace advice given to you by your health care provider. Make sure you discuss any questions you have with your health care provider. Document Released: 02/13/2010 Document Revised: 06/19/2015 Document Reviewed: 03/07/2014 Elsevier Interactive Patient Education  [employer].

## 2016-12-15 ENCOUNTER — Encounter: Payer: Self-pay | Admitting: Family Medicine

## 2016-12-20 ENCOUNTER — Telehealth: Payer: Self-pay

## 2016-12-20 DIAGNOSIS — L570 Actinic keratosis: Secondary | ICD-10-CM | POA: Diagnosis not present

## 2016-12-20 DIAGNOSIS — D229 Melanocytic nevi, unspecified: Secondary | ICD-10-CM | POA: Diagnosis not present

## 2016-12-20 DIAGNOSIS — L821 Other seborrheic keratosis: Secondary | ICD-10-CM | POA: Diagnosis not present

## 2016-12-20 DIAGNOSIS — M79673 Pain in unspecified foot: Principal | ICD-10-CM

## 2016-12-20 DIAGNOSIS — L820 Inflamed seborrheic keratosis: Secondary | ICD-10-CM | POA: Diagnosis not present

## 2016-12-20 DIAGNOSIS — Z85828 Personal history of other malignant neoplasm of skin: Secondary | ICD-10-CM | POA: Diagnosis not present

## 2016-12-20 DIAGNOSIS — G8929 Other chronic pain: Secondary | ICD-10-CM

## 2016-12-20 NOTE — Telephone Encounter (Signed)
Can we place referral?   Copied from [place]. Topic: General - Other >> Dec 20, 2016  3:18 PM Yvette Rack wrote: Reason for CRM: patient states that she had fell on ice on 12-10-16 and saw Tor Netters at [facility] she wanted to refer fer to a bone doctor and she need that referral to be within network with her insurance she would like to go to [place] or another pediatrist  within her network please call patient she wanted Dr

## 2016-12-21 NOTE — Telephone Encounter (Signed)
I am happy to place a referral though I will forward to Tor Netters to see the exact reason for referral.

## 2016-12-22 NOTE — Addendum Note (Signed)
Addended by: [name] on: 12/22/2016 09:05 AM   Modules accepted: Orders

## 2016-12-22 NOTE — Telephone Encounter (Signed)
Yes, that is correct, chronic pain of foot with history of multiple surgeries.

## 2016-12-22 NOTE — Telephone Encounter (Signed)
Thanks. I will place referral.

## 2016-12-23 ENCOUNTER — Ambulatory Visit (HOSPITAL_COMMUNITY)
Admission: RE | Admit: 2016-12-23 | Discharge: 2016-12-23 | Disposition: A | Discharge status: Home / Self Care | Payer: Medicare Other | Source: Ambulatory Visit | Attending: Orthopedic Surgery | Admitting: Orthopedic Surgery

## 2016-12-23 ENCOUNTER — Other Ambulatory Visit (HOSPITAL_COMMUNITY): Payer: Self-pay | Admitting: Orthopedic Surgery

## 2016-12-23 DIAGNOSIS — M25562 Pain in left knee: Secondary | ICD-10-CM | POA: Diagnosis not present

## 2016-12-23 DIAGNOSIS — M7989 Other specified soft tissue disorders: Secondary | ICD-10-CM | POA: Diagnosis not present

## 2016-12-23 DIAGNOSIS — M79605 Pain in left leg: Secondary | ICD-10-CM | POA: Diagnosis not present

## 2016-12-23 NOTE — Progress Notes (Signed)
*  PRELIMINARY RESULTS* Vascular Ultrasound Left lower extremity venous duplex has been completed.  Preliminary findings: No evidence of deep vein thrombosis or baker's cyst in the left lower extremity.  Preliminary results called to Dr. French Ana @ 14:14, given to [facility].   Everrett Coombe 12/23/2016, 2:13 PM

## 2017-04-01 ENCOUNTER — Other Ambulatory Visit: Payer: Self-pay

## 2017-04-01 ENCOUNTER — Ambulatory Visit (INDEPENDENT_AMBULATORY_CARE_PROVIDER_SITE_OTHER): Payer: Medicare Other | Admitting: Family Medicine

## 2017-04-01 ENCOUNTER — Encounter: Payer: Self-pay | Admitting: Family Medicine

## 2017-04-01 VITALS — BP 150/90 | HR 85 | Temp 98.7°F | Ht 68.0 in | Wt 219.5 lb

## 2017-04-01 DIAGNOSIS — H029 Unspecified disorder of eyelid: Secondary | ICD-10-CM | POA: Insufficient documentation

## 2017-04-01 MED ORDER — ERYTHROMYCIN 5 MG/GM OP OINT: 1.0000 "application " | TOPICAL_OINTMENT | Freq: Every day | OPHTHALMIC | 0 refills | Status: AC

## 2017-04-01 NOTE — Assessment & Plan Note (Signed)
No conjunctivitis, no foreign body.  Area of redness and swelling in internal upper eye lid. Warm compresses 2-3 times daily.  Apply topical antibiotics.  if not improving in next week.. Follow up with PCP.

## 2017-04-01 NOTE — Progress Notes (Signed)
   Subjective:    Patient ID: Donna Rios, female    DOB: 1951/01/13, 67 y.o.   MRN: [id number]  HPI   67 year old female pt with allergic rhinitis and chronic headaches who presents with new onset eyelid redness x 1 week.   She reports occ sharp pain in right eyelid .Marland Kitchen Not in eye.  right eyelid sore and itchy.  No redness in conjunctiva.  Ninimal discharge.  No vision change.  No fever.   No new eye makeup. No new soap change.   She feels like she may have gotten something in eye on widy day. No foreign body sensation.   Has history of cataracts.    Blood pressure (!) 150/90, pulse 85, temperature 98.7 F (37.1 C), temperature source Oral, height 5\' 8"  (1.727 m), weight 219 lb 8 oz (99.6 kg).   Review of Systems  Constitutional: Negative for fatigue and fever.  HENT: Negative for ear pain.   Eyes: Negative for pain.  Respiratory: Negative for chest tightness and shortness of breath.   Cardiovascular: Negative for chest pain, palpitations and leg swelling.  Gastrointestinal: Negative for abdominal pain.  Genitourinary: Negative for dysuria.       Objective:   Physical Exam  Constitutional: Vital signs are normal. She appears well-developed and well-nourished. She is cooperative.  Non-toxic appearance. She does not appear ill. No distress.  HENT:  Head: Normocephalic.  Right Ear: Hearing, tympanic membrane, external ear and ear canal normal. Tympanic membrane is not erythematous, not retracted and not bulging.  Left Ear: Hearing, tympanic membrane, external ear and ear canal normal. Tympanic membrane is not erythematous, not retracted and not bulging.  Nose: No mucosal edema or rhinorrhea. Right sinus exhibits no maxillary sinus tenderness and no frontal sinus tenderness. Left sinus exhibits no maxillary sinus tenderness and no frontal sinus tenderness.  Mouth/Throat: Uvula is midline, oropharynx is clear and moist and mucous membranes are normal. No posterior  oropharyngeal erythema.  Eyes: Conjunctivae and EOM are normal. Pupils are equal, round, and reactive to light. Lids are everted and swept, no foreign bodies found. Right eye exhibits hordeolum. Right eye exhibits no discharge and no exudate. Left eye exhibits no chemosis, no discharge, no exudate and no hordeolum. No foreign body present in the left eye.  No conjunctivitis, no foreign body.  Area of redness and swelling in internal upper eye lid.   Neck: Trachea normal and normal range of motion. Neck supple. Carotid bruit is not present. No thyroid mass and no thyromegaly present.  Cardiovascular: Normal rate, regular rhythm, S1 normal, S2 normal, normal heart sounds, intact distal pulses and normal pulses. Exam reveals no gallop and no friction rub.  No murmur heard. Pulmonary/Chest: Effort normal and breath sounds normal. No tachypnea. No respiratory distress. She has no decreased breath sounds. She has no wheezes. She has no rhonchi. She has no rales.  Abdominal: Soft. Normal appearance and bowel sounds are normal. There is no tenderness.  Neurological: She is alert.  Skin: Skin is warm, dry and intact. No rash noted.  Psychiatric: Her speech is normal and behavior is normal. Judgment and thought content normal. Her mood appears not anxious. Cognition and memory are normal. She does not exhibit a depressed mood.          Assessment & Plan:

## 2017-04-01 NOTE — Patient Instructions (Signed)
Warm compresses 2-3 times daily.  Apply topical antibiotics.  if not improving in next week.. Follow up with PCP.

## 2017-04-23 ENCOUNTER — Other Ambulatory Visit: Payer: Self-pay | Admitting: Family Medicine

## 2017-05-09 ENCOUNTER — Ambulatory Visit (INDEPENDENT_AMBULATORY_CARE_PROVIDER_SITE_OTHER): Payer: Medicare Other | Admitting: Family Medicine

## 2017-05-09 ENCOUNTER — Encounter: Payer: Self-pay | Admitting: Family Medicine

## 2017-05-09 ENCOUNTER — Other Ambulatory Visit: Payer: Self-pay

## 2017-05-09 VITALS — BP 146/88 | HR 78 | Temp 98.2°F | Wt 219.8 lb

## 2017-05-09 DIAGNOSIS — R002 Palpitations: Secondary | ICD-10-CM

## 2017-05-09 DIAGNOSIS — E785 Hyperlipidemia, unspecified: Secondary | ICD-10-CM

## 2017-05-09 DIAGNOSIS — R1011 Right upper quadrant pain: Secondary | ICD-10-CM | POA: Diagnosis not present

## 2017-05-09 DIAGNOSIS — R011 Cardiac murmur, unspecified: Secondary | ICD-10-CM | POA: Diagnosis not present

## 2017-05-09 DIAGNOSIS — Z1239 Encounter for other screening for malignant neoplasm of breast: Secondary | ICD-10-CM

## 2017-05-09 DIAGNOSIS — Z1231 Encounter for screening mammogram for malignant neoplasm of breast: Secondary | ICD-10-CM

## 2017-05-09 DIAGNOSIS — F419 Anxiety disorder, unspecified: Secondary | ICD-10-CM | POA: Diagnosis not present

## 2017-05-09 DIAGNOSIS — J309 Allergic rhinitis, unspecified: Secondary | ICD-10-CM

## 2017-05-09 DIAGNOSIS — R7303 Prediabetes: Secondary | ICD-10-CM

## 2017-05-09 DIAGNOSIS — R030 Elevated blood-pressure reading, without diagnosis of hypertension: Secondary | ICD-10-CM

## 2017-05-09 DIAGNOSIS — F32A Depression, unspecified: Secondary | ICD-10-CM | POA: Insufficient documentation

## 2017-05-09 DIAGNOSIS — G8929 Other chronic pain: Secondary | ICD-10-CM

## 2017-05-09 DIAGNOSIS — F329 Major depressive disorder, single episode, unspecified: Secondary | ICD-10-CM | POA: Diagnosis not present

## 2017-05-09 DIAGNOSIS — R51 Headache: Secondary | ICD-10-CM

## 2017-05-09 LAB — COMPREHENSIVE METABOLIC PANEL
ALK PHOS: 124 U/L — AB (ref 39–117)
ALT: 93 U/L — ABNORMAL HIGH (ref 0–35)
AST: 21 U/L (ref 0–37)
Albumin: 4.3 g/dL (ref 3.5–5.2)
BILIRUBIN TOTAL: 0.4 mg/dL (ref 0.2–1.2)
BUN: 14 mg/dL (ref 6–23)
CO2: 29 mEq/L (ref 19–32)
CREATININE: 0.69 mg/dL (ref 0.40–1.20)
Calcium: 9.5 mg/dL (ref 8.4–10.5)
Chloride: 105 mEq/L (ref 96–112)
GFR: 90.28 mL/min (ref >60.00)
Glucose, Bld: 106 mg/dL — ABNORMAL HIGH (ref 70–99)
POTASSIUM: 5 meq/L (ref 3.5–5.1)
SODIUM: 139 meq/L (ref 135–145)
Total Protein: 7 g/dL (ref 6.0–8.3)

## 2017-05-09 LAB — LIPID PANEL
CHOL/HDL RATIO: 3
CHOLESTEROL: 152 mg/dL (ref 0–200)
HDL: 45.8 mg/dL (ref >39.00)
NonHDL: 106.35
Triglycerides: 243 mg/dL — ABNORMAL HIGH (ref 0.0–149.0)
VLDL: 48.6 mg/dL — ABNORMAL HIGH (ref 0.0–40.0)

## 2017-05-09 LAB — CBC
HEMATOCRIT: 38.9 % (ref 36.0–46.0)
Hemoglobin: 13.6 g/dL (ref 12.0–15.0)
MCHC: 35 g/dL (ref 30.0–36.0)
MCV: 91.8 fl (ref 78.0–100.0)
PLATELETS: 211 10*3/uL (ref 150.0–400.0)
RBC: 4.24 Mil/uL (ref 3.87–5.11)
RDW: 13.5 % (ref 11.5–15.5)
WBC: 8.3 10*3/uL (ref 4.0–10.5)

## 2017-05-09 LAB — TSH: TSH: 0.87 u[IU]/mL (ref 0.35–4.50)

## 2017-05-09 LAB — LDL CHOLESTEROL, DIRECT: Direct LDL: 84 mg/dL

## 2017-05-09 LAB — HEMOGLOBIN A1C: HEMOGLOBIN A1C: 6 % (ref 4.6–6.5)

## 2017-05-09 MED ORDER — BUPROPION HCL ER (SR) 150 MG PO TB12: ORAL_TABLET | ORAL | 3 refills | Status: DC

## 2017-05-09 NOTE — Assessment & Plan Note (Signed)
Slightly more prominent on exam.  Will order an echo.

## 2017-05-09 NOTE — Patient Instructions (Signed)
Nice to see you. We will check lab work today and contact you with the results. We will place you on Wellbutrin for smoking cessation as well as your depression. We will get you set up for an ultrasound of your stomach as well as an echo of your heart given your heart murmur. We will get you set up for mammogram. If you develop persistent abdominal pain or you develop thoughts of harming yourself or others please seek medical attention immediately.

## 2017-05-09 NOTE — Assessment & Plan Note (Signed)
Elevated in the office though normal at home.  She will continue to monitor at home and if it gets above goal she will let us know.

## 2017-05-09 NOTE — Assessment & Plan Note (Signed)
Appears to be worsening.  No SI or HI.  She would be interested in Wellbutrin given potential for smoking cessation with this as well.  We will start her on this.  I suspect her heart racing is related to her anxiety though we will check lab work and determine if cardiology evaluation is needed after that.

## 2017-05-09 NOTE — Assessment & Plan Note (Signed)
Continue current regimen

## 2017-05-09 NOTE — Assessment & Plan Note (Signed)
Check A1c. 

## 2017-05-09 NOTE — Assessment & Plan Note (Signed)
More frequent migrainous type headaches.  Neurologically intact.  Will refer to neurology.

## 2017-05-09 NOTE — Assessment & Plan Note (Signed)
Continue Crestor.  Lab work as outlined below.
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
## 2017-05-11 ENCOUNTER — Telehealth: Payer: Self-pay | Admitting: Radiology

## 2017-05-11 ENCOUNTER — Telehealth: Payer: Self-pay

## 2017-05-11 DIAGNOSIS — R7989 Other specified abnormal findings of blood chemistry: Secondary | ICD-10-CM

## 2017-05-11 DIAGNOSIS — R945 Abnormal results of liver function studies: Principal | ICD-10-CM

## 2017-05-11 NOTE — Telephone Encounter (Signed)
Message sent to Dr. [name] asking per patient if its ok for her to take her Welbutrin since her labs results are in.

## 2017-05-11 NOTE — Telephone Encounter (Signed)
She can start the wellbutrin.

## 2017-05-11 NOTE — Telephone Encounter (Signed)
-----   Message from [name], MD sent at 05/09/2017  5:53 PM EDT ----- Please let the patient know that 2 of her liver function tests were slightly elevated.  I would suggest rechecking those later this week. Please place an order for a hepatic function panel for elevated LFTs. We will also get an ultrasound of her right upper quadrant to evaluate her gallbladder.  She should avoid fatty foods.  Her A1c is in the prediabetic range.  Her triglycerides are improved from prior.  Her cholesterol otherwise is adequately controlled.  Thanks.

## 2017-05-11 NOTE — Telephone Encounter (Signed)
Pt coming in for labs tomorrow, please place future orders. Thank you.  

## 2017-05-11 NOTE — Telephone Encounter (Signed)
Patient wants to know that since her labs are back if she can take Welburtrin?

## 2017-05-11 NOTE — Addendum Note (Signed)
Addended by: [name] on: 05/11/2017 08:21 PM   Modules accepted: Orders

## 2017-05-11 NOTE — Telephone Encounter (Signed)
Ordered

## 2017-05-11 NOTE — Telephone Encounter (Signed)
Pt given lab results per notes of Dr. [name] on 05/09/17. Pt verbalized understanding. Unable to document in result note due to result note not routed to [facility].  Patient asks if it is ok to take her Wellbutrin since her lab results are in. I advised this question would be sent to Dr. [name] and she will be called with his recommendation, she asks to be called on her cell [phone]. Lab appointment scheduled for tomorrow at 0830.

## 2017-05-11 NOTE — Telephone Encounter (Signed)
Left voicemail for patient to call office back about lab results. Ok for [facility] to given patient lab results.

## 2017-05-12 ENCOUNTER — Other Ambulatory Visit (INDEPENDENT_AMBULATORY_CARE_PROVIDER_SITE_OTHER): Payer: Medicare Other

## 2017-05-12 ENCOUNTER — Telehealth: Payer: Self-pay | Admitting: Family Medicine

## 2017-05-12 DIAGNOSIS — R7989 Other specified abnormal findings of blood chemistry: Secondary | ICD-10-CM

## 2017-05-12 DIAGNOSIS — R945 Abnormal results of liver function studies: Secondary | ICD-10-CM

## 2017-05-12 LAB — HEPATIC FUNCTION PANEL
ALBUMIN: 4.3 g/dL (ref 3.5–5.2)
ALT: 41 U/L — AB (ref 0–35)
AST: 17 U/L (ref 0–37)
Alkaline Phosphatase: 109 U/L (ref 39–117)
Bilirubin, Direct: 0.1 mg/dL (ref 0.0–0.3)
Total Bilirubin: 0.5 mg/dL (ref 0.2–1.2)
Total Protein: 6.8 g/dL (ref 6.0–8.3)

## 2017-05-12 NOTE — Telephone Encounter (Signed)
Pt.notified

## 2017-05-12 NOTE — Telephone Encounter (Signed)
Pt would like to have her lab work results mailed to her please and thank you. Address is current.

## 2017-05-12 NOTE — Telephone Encounter (Signed)
It is ok to mail a copy of her Labs to her.

## 2017-05-12 NOTE — Telephone Encounter (Signed)
Copy of patients labs have been mailed.

## 2017-05-12 NOTE — Telephone Encounter (Signed)
I have forwarded to the correct provider. In the future, please verify you are sending to the correct person.    Dr. [name], I believe these are for you and your CMA.

## 2017-05-13 ENCOUNTER — Ambulatory Visit
Admission: RE | Admit: 2017-05-13 | Discharge: 2017-05-13 | Disposition: A | Discharge status: Home / Self Care | Payer: Medicare Other | Source: Ambulatory Visit | Attending: Family Medicine | Admitting: Family Medicine

## 2017-05-13 DIAGNOSIS — R1011 Right upper quadrant pain: Secondary | ICD-10-CM | POA: Diagnosis not present

## 2017-05-13 DIAGNOSIS — K760 Fatty (change of) liver, not elsewhere classified: Secondary | ICD-10-CM | POA: Diagnosis not present

## 2017-05-16 ENCOUNTER — Telehealth: Payer: Self-pay

## 2017-05-16 NOTE — Progress Notes (Signed)
Left voicemail for patient that we have scheduled her for a mammogram at [facility] on 06-01-17 @ 11:40 and if she needs to reschedule then she can contact [place].

## 2017-05-16 NOTE — Telephone Encounter (Signed)
Voicemail left for patient that mammogram has been scheduled at [facility] on 06-01-17 @11 :40 and if she needs to reschedule she can contact [place] and to call our office if she has any further questions or concerns.

## 2017-05-16 NOTE — Telephone Encounter (Signed)
-----   Message from [name], MD sent at 05/09/2017  5:11 PM EDT ----- Patient needs a mammogram scheduled. Can you contact [facility] to schedule this and then let the patient know? Order already placed. Thanks. Eric.

## 2017-05-16 NOTE — Progress Notes (Signed)
Voicemail left for patient that mammogram has been scheduled at [facility] on 06-01-17 @11 :28 and if she needs to reschedule she can contact [place] and to call our office if she has any further questions or concerns.

## 2017-05-19 ENCOUNTER — Other Ambulatory Visit: Payer: Self-pay | Admitting: Family Medicine

## 2017-05-19 DIAGNOSIS — R945 Abnormal results of liver function studies: Principal | ICD-10-CM

## 2017-05-19 DIAGNOSIS — R1011 Right upper quadrant pain: Secondary | ICD-10-CM

## 2017-05-19 DIAGNOSIS — R7989 Other specified abnormal findings of blood chemistry: Secondary | ICD-10-CM

## 2017-05-27 ENCOUNTER — Other Ambulatory Visit: Payer: Self-pay | Admitting: Family Medicine

## 2017-06-01 ENCOUNTER — Ambulatory Visit
Admission: RE | Admit: 2017-06-01 | Discharge: 2017-06-01 | Disposition: A | Discharge status: Home / Self Care | Payer: Medicare Other | Source: Ambulatory Visit | Attending: Family Medicine | Admitting: Family Medicine

## 2017-06-01 DIAGNOSIS — Z1239 Encounter for other screening for malignant neoplasm of breast: Secondary | ICD-10-CM

## 2017-06-01 DIAGNOSIS — Z1231 Encounter for screening mammogram for malignant neoplasm of breast: Secondary | ICD-10-CM | POA: Diagnosis not present

## 2017-06-03 ENCOUNTER — Telehealth: Payer: Self-pay

## 2017-06-03 NOTE — Telephone Encounter (Signed)
Copied from [place] [id number]. Topic: Quick Communication - See Telephone Encounter >> Jun 03, 2017  8:48 AM Hewitt Shorts wrote: Amado Coe from [facility] service [facility] needing a precert for a hida scan scheduled for tomrorrow 1000    Best number for [place] [phone]

## 2017-06-06 ENCOUNTER — Telehealth: Payer: Self-pay | Admitting: *Deleted

## 2017-06-06 NOTE — Telephone Encounter (Signed)
Left message for patient to notify them that it is time to schedule annual low dose lung cancer screening CT scan. Instructed patient to call back to verify information prior to the scan being scheduled.  

## 2017-06-07 ENCOUNTER — Ambulatory Visit (INDEPENDENT_AMBULATORY_CARE_PROVIDER_SITE_OTHER): Payer: Medicare Other

## 2017-06-07 VITALS — BP 142/78 | HR 85 | Temp 98.8°F | Resp 14 | Ht 68.0 in | Wt 216.8 lb

## 2017-06-07 DIAGNOSIS — Z23 Encounter for immunization: Secondary | ICD-10-CM

## 2017-06-07 DIAGNOSIS — R945 Abnormal results of liver function studies: Secondary | ICD-10-CM | POA: Diagnosis not present

## 2017-06-07 DIAGNOSIS — R7989 Other specified abnormal findings of blood chemistry: Secondary | ICD-10-CM

## 2017-06-07 DIAGNOSIS — Z0000 Encounter for general adult medical examination without abnormal findings: Secondary | ICD-10-CM

## 2017-06-07 LAB — HEPATIC FUNCTION PANEL
ALT: 11 U/L (ref 0–35)
AST: 14 U/L (ref 0–37)
Albumin: 4.2 g/dL (ref 3.5–5.2)
Alkaline Phosphatase: 88 U/L (ref 39–117)
Bilirubin, Direct: 0.1 mg/dL (ref 0.0–0.3)
TOTAL PROTEIN: 7.2 g/dL (ref 6.0–8.3)
Total Bilirubin: 0.5 mg/dL (ref 0.2–1.2)

## 2017-06-07 NOTE — Progress Notes (Signed)
Subjective:   Donna Rios is a 67 y.o. female who presents for an Initial Medicare Annual Wellness Visit.  Review of Systems    No ROS.  Medicare Wellness Visit. Additional risk factors are reflected in the social history.  Cardiac Risk Factors include: advanced age (>74men, >66 women)     Objective:    Today's Vitals   06/07/17 0941  BP: (!) 142/78  Pulse: 85  Resp: 14  Temp: 98.8 F (37.1 C)  TempSrc: Oral  SpO2: 95%  Weight: 216 lb 12.8 oz (98.3 kg)  Height: 5\' 8"  (1.727 m)   Body mass index is 32.96 kg/m.  Advanced Directives 06/07/2017  Does Patient Have a Medical Advance Directive? No  Would patient like information on creating a medical advance directive? Yes (MAU/Ambulatory/Procedural Areas - Information given)    Current Medications (verified) Outpatient Encounter Medications as of 06/07/2017  Medication Sig  . acetaminophen (TYLENOL) 650 MG CR tablet Take 650 mg by mouth.  . APPLE CIDER VINEGAR PO Take 1 tablet by mouth daily as needed.  Marland Kitchen buPROPion (WELLBUTRIN SR) 150 MG 12 hr tablet Take 150 mg (1 tablet) by mouth daily for 3 days, then take 150 mg (1 tablet) by mouth twice daily (Patient taking differently: 150 mg daily. Take 150 mg (1 tablet) by mouth daily for 3 days, then take 150 mg (1 tablet) by mouth twice daily)  . cholecalciferol (VITAMIN D) 400 units TABS tablet Take 2,000 Units by mouth.  . fluticasone (FLONASE) 50 MCG/ACT nasal spray Place 1 spray into both nostrils daily.  Marland Kitchen loratadine (CLARITIN) 10 MG tablet Take 10 mg by mouth daily.  . meloxicam (MOBIC) 15 MG tablet   . naphazoline-glycerin (CLEAR EYES) 0.012-0.2 % SOLN Apply to eye.  . rosuvastatin (CRESTOR) 20 MG tablet TAKE 1 TABLET BY MOUTH EVERY DAY   No facility-administered encounter medications on file as of 06/07/2017.     Allergies (verified) Ibuprofen and Asa [aspirin]   History: Past Medical History:  Diagnosis Date  . Allergic rhinitis   . Arthritis   . Heart murmur    . Phlebitis   . Stress incontinence    Past Surgical History:  Procedure Laterality Date  . BREAST BIOPSY Left    ducts removed  . Cataract surgery    . FOOT SURGERY     7  . LAPAROSCOPIC HYSTERECTOMY    . VARICOSE VEIN SURGERY     Family History  Problem Relation Age of Onset  . Alcoholism Unknown   . Arthritis Unknown   . Lung cancer Unknown   . Heart disease Unknown   . Stroke Unknown   . Hypertension Unknown   . Diabetes Unknown   . Breast cancer Neg Hx    Social History   Socioeconomic History  . Marital status: Married    Spouse name: Not on file  . Number of children: Not on file  . Years of education: Not on file  . Highest education level: Not on file  Occupational History  . Not on file  Social Needs  . Financial resource strain: Not hard at all  . Food insecurity:    Worry: Never true    Inability: Never true  . Transportation needs:    Medical: No    Non-medical: No  Tobacco Use  . Smoking status: Current Every Day Smoker    Packs/day: 0.50    Years: 46.00    Pack years: 23.00    Types: Cigarettes  .  Smokeless tobacco: Never Used  Substance and Sexual Activity  . Alcohol use: No    Alcohol/week: 0.0 oz  . Drug use: No  . Sexual activity: Not on file  Lifestyle  . Physical activity:    Days per week: Not on file    Minutes per session: Not on file  . Stress: Not on file  Relationships  . Social connections:    Talks on phone: Not on file    Gets together: Not on file    Attends religious service: Not on file    Active member of club or organization: Not on file    Attends meetings of clubs or organizations: Not on file    Relationship status: Not on file  Other Topics Concern  . Not on file  Social History Narrative  . Not on file    Tobacco Counseling Ready to quit: Not Answered Counseling given: Not Answered   Clinical Intake:  Pre-visit preparation completed: Yes  Pain : No/denies pain     Nutritional Status: BMI >  30  Obese Diabetes: No(Prediabetes; followed by PCP)  How often do you need to have someone help you when you read instructions, pamphlets, or other written materials from your doctor or pharmacy?: 1 - Never  Interpreter Needed?: No      Activities of Daily Living In your present state of health, do you have any difficulty performing the following activities: 06/07/2017  Hearing? N  Vision? N  Difficulty concentrating or making decisions? N  Walking or climbing stairs? Y  Comment R knee pain, intermittent  Dressing or bathing? N  Doing errands, shopping? N  Preparing Food and eating ? N  Using the Toilet? N  In the past six months, have you accidently leaked urine? N  Do you have problems with loss of bowel control? N  Managing your Medications? N  Managing your Finances? N  Housekeeping or managing your Housekeeping? N  Some recent data might be hidden     Immunizations and Health Maintenance Immunization History  Administered Date(s) Administered  . Influenza, High Dose Seasonal PF 11/06/2015, 10/06/2016  . Pneumococcal Conjugate-13 05/06/2016  . Pneumococcal Polysaccharide-23 06/07/2017  . Tdap 05/06/2016   Health Maintenance Due  Topic Date Due  . DEXA SCAN  08/24/2015    Patient Care Team: [name], MD as PCP - General (Family Medicine)  Indicate any recent Medical Services you may have received from other than [facility] providers in the past year (date may be approximate).     Assessment:   This is a routine wellness examination for Donna Rios.  The goal of the wellness visit is to assist the patient how to close the gaps in care and create a preventative care plan for the patient.   The roster of all physicians providing medical care to patient is listed in the Snapshot section of the chart.  Taking calcium VIT D as appropriate/Osteoporosis risk reviewed.    Safety issues reviewed; Smoke and carbon monoxide detectors in the home. No firearms in the  home. Wears seatbelts when driving or riding with others. No violence in the home.  They do not have excessive sun exposure.  Discussed the need for sun protection: hats, long sleeves and the use of sunscreen if there is significant sun exposure.  Patient is alert, normal appearance, oriented to person/place/and time.  Correctly identified the president of the [place] and recalls of 4/5 words. Performs simple calculations and can read correct time from watch face.  Displays appropriate judgement.  No new identified risk were noted.  No failures at ADL's or IADL's.    BMI- discussed the importance of a healthy diet, water intake and the benefits of aerobic exercise. Educational material provided.   24 hour diet recall: Low cholesterol diet  Dental- UTD.  Medications- Taking bupropion 150mg , 1 tablet per day for the last 2 weeks.  Not taking as directed BID due to unforseen stressors with her husbands health.  Smoking about a half pack of cigarettes daily. Encouraged to follow up with her pcp.    Eye- Visual acuity not assessed per patient preference since they have regular follow up with the ophthalmologist.  Wears corrective lenses.  Sleep patterns- Sleeps 6-7 hours at night.  Wakes feeling rested.    PNA 23 vaccine administered L deltoid, tolerated well. Educational material provided.  Fasting labs completed.   Hearing/Vision screen  Visual Acuity Screening   Right eye Left eye Both eyes  Without correction:     With correction:   20/20  Comments: Wears glasses when reading Cataract extraction, bilateral Visual acuity not assessed per patient preference since they have regular follow up with the ophthalmologist  Hearing Screening Comments: Patient is able to hear conversational tones without difficulty.  No issues reported.   Dietary issues and exercise activities discussed: Current Exercise Habits: Home exercise routine, Type of exercise: walking, Time (Minutes): 30, Frequency  (Times/Week): 7, Weekly Exercise (Minutes/Week): 210, Intensity: Mild  Goals    . Increase physical activity     Silver sneaker program      Depression Screen PHQ 2/9 Scores 06/07/2017 05/06/2016  PHQ - 2 Score 0 0    Fall Risk Fall Risk  06/07/2017 05/06/2016  Falls in the past year? Yes No   Cognitive Function: MMSE - Mini Mental State Exam 06/07/2017 05/06/2016  Orientation to time 5 5  Orientation to Place 5 5  Registration 3 3  Attention/ Calculation 5 5  Recall 3 2  Language- name 2 objects 2 2  Language- repeat 1 1  Language- follow 3 step command 3 3  Language- read & follow direction 1 1  Write a sentence 1 1  Copy design 1 1  Total score 30 29        Screening Tests Health Maintenance  Topic Date Due  . DEXA SCAN  08/24/2015  . INFLUENZA VACCINE  08/25/2017  . Fecal DNA (Cologuard)  04/28/2019  . MAMMOGRAM  06/02/2019  . TETANUS/TDAP  05/07/2026  . Hepatitis C Screening  Completed  . PNA vac Low Risk Adult  Completed     Plan:    End of life planning; Advance aging; Advanced directives discussed. Copy of current HCPOA/Living Will discussed.    Schedule Dexa Scan.   Follow up with pcp regarding medication management of bupropion.   I have personally reviewed and noted the following in the patient's chart:   . Medical and social history . Use of alcohol, tobacco or illicit drugs  . Current medications and supplements . Functional ability and status . Nutritional status . Physical activity . Advanced directives . List of other physicians . Hospitalizations, surgeries, and ER visits in previous 12 months . Vitals . Screenings to include cognitive, depression, and falls . Referrals and appointments  In addition, I have reviewed and discussed with patient certain preventive protocols, quality metrics, and best practice recommendations. A written personalized care plan for preventive services as well as general preventive health recommendations were  provided to  patient.     Varney Biles, LPN   08/12/5972

## 2017-06-07 NOTE — Patient Instructions (Addendum)
  Donna Rios , Thank you for taking time to come for your Medicare Wellness Visit. I appreciate your ongoing commitment to your health goals. Please review the following plan we discussed and let me know if I can assist you in the future.   Follow up as needed.    Bring a copy of your [place] and/or Living Will to be scanned into chart once completed.   CALL ABOUT YOUR BONE DENSITY  Have a great day!  These are the goals we discussed: Goals    . Increase physical activity     Silver sneaker program       This is a list of the screening recommended for you and due dates:  Health Maintenance  Topic Date Due  . DEXA scan (bone density measurement)  08/24/2015  . Flu Shot  08/25/2017  . Cologuard (Stool DNA test)  04/28/2019  . Mammogram  06/02/2019  . Tetanus Vaccine  05/07/2026  .  Hepatitis C: One time screening is recommended by Center for Disease Control  (CDC) for  adults born from [birth year] through 1965.   Completed  . Pneumonia vaccines  Completed

## 2017-06-08 ENCOUNTER — Other Ambulatory Visit: Payer: Self-pay | Admitting: Family Medicine

## 2017-06-08 DIAGNOSIS — Z13820 Encounter for screening for osteoporosis: Secondary | ICD-10-CM

## 2017-06-10 ENCOUNTER — Telehealth: Payer: Self-pay | Admitting: *Deleted

## 2017-06-10 DIAGNOSIS — Z122 Encounter for screening for malignant neoplasm of respiratory organs: Secondary | ICD-10-CM

## 2017-06-10 DIAGNOSIS — Z87891 Personal history of nicotine dependence: Secondary | ICD-10-CM

## 2017-06-10 NOTE — Telephone Encounter (Signed)
Notified patient that annual lung cancer screening low dose CT scan is due currently or will be in near future. Confirmed that patient is within the age range of 55-77, and asymptomatic, (no signs or symptoms of lung cancer). Patient denies illness that would prevent curative treatment for lung cancer if found. Verified smoking history, (current, 46.5 pack year). The shared decision making visit was done 05/25/16. Patient is agreeable for CT scan being scheduled.

## 2017-06-14 ENCOUNTER — Ambulatory Visit
Admission: RE | Admit: 2017-06-14 | Discharge: 2017-06-14 | Disposition: A | Discharge status: Home / Self Care | Payer: Medicare Other | Source: Ambulatory Visit | Attending: Family Medicine | Admitting: Family Medicine

## 2017-06-14 DIAGNOSIS — I350 Nonrheumatic aortic (valve) stenosis: Secondary | ICD-10-CM | POA: Diagnosis not present

## 2017-06-14 DIAGNOSIS — R011 Cardiac murmur, unspecified: Secondary | ICD-10-CM

## 2017-06-14 NOTE — Progress Notes (Signed)
*  PRELIMINARY RESULTS* Echocardiogram 2D Echocardiogram has been performed.  Donna Rios 06/14/2017, 11:42 AM

## 2017-06-15 ENCOUNTER — Other Ambulatory Visit: Payer: Self-pay | Admitting: Family Medicine

## 2017-06-15 DIAGNOSIS — I350 Nonrheumatic aortic (valve) stenosis: Secondary | ICD-10-CM

## 2017-06-16 ENCOUNTER — Other Ambulatory Visit
Admission: RE | Admit: 2017-06-16 | Discharge: 2017-06-16 | Disposition: A | Discharge status: Home / Self Care | Payer: Medicare Other | Source: Ambulatory Visit | Attending: Gastroenterology | Admitting: Gastroenterology

## 2017-06-16 ENCOUNTER — Encounter: Payer: Self-pay | Admitting: Gastroenterology

## 2017-06-16 ENCOUNTER — Ambulatory Visit: Payer: Medicare Other | Admitting: Gastroenterology

## 2017-06-16 VITALS — BP 122/72 | HR 86 | Resp 17 | Ht 68.0 in | Wt 213.8 lb

## 2017-06-16 DIAGNOSIS — R1011 Right upper quadrant pain: Secondary | ICD-10-CM | POA: Diagnosis not present

## 2017-06-16 DIAGNOSIS — G8929 Other chronic pain: Secondary | ICD-10-CM | POA: Diagnosis not present

## 2017-06-16 DIAGNOSIS — R1013 Epigastric pain: Secondary | ICD-10-CM | POA: Diagnosis not present

## 2017-06-16 NOTE — Progress Notes (Signed)
Donna Darby, MD [address]  [place]  [place], [place] [address]  Main: [phone]  Fax: [phone]    Gastroenterology Consultation  Referring Provider:     [name], MD Primary Care Physician:  Donna Haven, MD Primary Gastroenterologist:  Dr. Cephas Rios Reason for Consultation:     Fatty liver, chronic right upper quadrant pain        HPI:   Donna Rios is a 67 y.o. female referred by Dr. Caryl Rios, Donna Adam, MD  for consultation & management of fatty liver. Patient had an episode of right upper quadrant pain lasted for about a week after a fatty meal in 04/2017. She saw Dr. Caryl Rios who checked LFTs which revealed elevated alkaline phosphatase and AST. She subsequently underwent ultrasound liver which revealed hepatic steatosis. Normal gallbladder and normal CBD. Repeat LFTs came back normal as of 06/07/2017. Apparently, patient reports that she has been dealing with several years history of mild right upper quadrant discomfort after eating fatty meals and radiates to right subcostal region. She has intentionally cut back on high carbs diet, red meat, fatty foods that resulted in some weight loss. She also reports mild epigastric discomfort. She denies nausea, vomiting, fever, chills. She stopped meloxicam about a month ago when she found out about elevated LFTs. HCV antibody negative. She reports having had an upper endoscopy and a colonoscopy about 15 years ago by Dr. Jake Rios for evaluation of right upper quadrant pain as well as screening for colon cancer.These procedures were reportedly normal according to the patient She is a chronic smoker, started since age of 89 She is also recently found to have severe aortic stenosis and awaiting to see cardiologist.  NSAIDs: none  Antiplts/Anticoagulants/Anti thrombotics: none  GI Procedures: EGD and colonoscopy by Dr. Jake Rios, reportedly normal Colo-guard test negative in 2018  Past Medical History:    Diagnosis Date  . Allergic rhinitis   . Arthritis   . Heart murmur   . Phlebitis   . Stress incontinence     Past Surgical History:  Procedure Laterality Date  . BREAST BIOPSY Left    ducts removed  . Cataract surgery    . FOOT SURGERY     7  . LAPAROSCOPIC HYSTERECTOMY    . VARICOSE VEIN SURGERY      Current Outpatient Medications:  .  acetaminophen (TYLENOL) 650 MG CR tablet, Take 650 mg by mouth., Disp: , Rfl:  .  APPLE CIDER VINEGAR PO, Take 1 tablet by mouth daily as needed., Disp: , Rfl:  .  buPROPion (WELLBUTRIN SR) 150 MG 12 hr tablet, Take 150 mg (1 tablet) by mouth daily for 3 days, then take 150 mg (1 tablet) by mouth twice daily (Patient taking differently: 150 mg daily. Take 150 mg (1 tablet) by mouth daily for 3 days, then take 150 mg (1 tablet) by mouth twice daily), Disp: 60 tablet, Rfl: 3 .  cholecalciferol (VITAMIN D) 400 units TABS tablet, Take 2,000 Units by mouth., Disp: , Rfl:  .  fluticasone (FLONASE) 50 MCG/ACT nasal spray, Place 1 spray into both nostrils daily., Disp: , Rfl:  .  loratadine (CLARITIN) 10 MG tablet, Take 10 mg by mouth daily., Disp: , Rfl:  .  naphazoline-glycerin (CLEAR EYES) 0.012-0.2 % SOLN, Apply to eye., Disp: , Rfl:  .  rosuvastatin (CRESTOR) 20 MG tablet, TAKE 1 TABLET BY MOUTH EVERY DAY, Disp: 30 tablet, Rfl: 0 .  meloxicam (MOBIC) 15 MG tablet, ,  Disp: , Rfl:    Family History  Problem Relation Age of Onset  . Alcoholism Unknown   . Arthritis Unknown   . Lung cancer Unknown   . Heart disease Unknown   . Stroke Unknown   . Hypertension Unknown   . Diabetes Unknown   . Breast cancer Neg Hx      Social History   Tobacco Use  . Smoking status: Current Every Day Smoker    Packs/day: 0.50    Years: 46.00    Pack years: 23.00    Types: Cigarettes  . Smokeless tobacco: Never Used  Substance Use Topics  . Alcohol use: No    Alcohol/week: 0.0 oz  . Drug use: No    Allergies as of 06/16/2017 - Review Complete  06/16/2017  Allergen Reaction Noted  . Ibuprofen Other (See Comments) 09/17/2015  . Asa [aspirin] Other (See Comments) 06/11/2015    Review of Systems:    All systems reviewed and negative except where noted in HPI.   Physical Exam:  BP 122/72 (BP Location: Left Arm, Patient Position: Sitting, Cuff Size: Large)   Pulse 86   Resp 17   Ht 5\' 8"  (1.727 m)   Wt 213 lb 12.8 oz (97 kg)   BMI 32.51 kg/m  No LMP recorded. Patient has had a hysterectomy.  General:   Alert,  Well-developed, well-nourished, pleasant and cooperative in NAD Head:  Normocephalic and atraumatic. Eyes:  Sclera clear, no icterus.   Conjunctiva pink. Ears:  Normal auditory acuity. Nose:  No deformity, discharge, or lesions. Mouth:  No deformity or lesions,oropharynx pink & moist. Neck:  Supple; no masses or thyromegaly. Lungs:  Respirations even and unlabored.  Clear throughout to auscultation.   No wheezes, crackles, or rhonchi. No acute distress. Heart:  Regular rate and rhythm; systolic murmurs, clicks, rubs, or gallops. Abdomen:  Normal bowel sounds. Soft, mild epigastric discomfort, reproducible pain in the right lateral subcostal area and non-distended without masses, hepatosplenomegaly or hernias noted.  No guarding or rebound tenderness.   Rectal: Not performed Msk:  Symmetrical without gross deformities. Good, equal movement & strength bilaterally. Pulses:  Normal pulses noted. Extremities:  No clubbing or edema.  No cyanosis. Neurologic:  Alert and oriented x3;  grossly normal neurologically. Skin:  Intact without significant lesions or rashes. No jaundice. Psych:  Alert and cooperative. Normal mood and affect.  Imaging Studies: Abdominal imaging reviewed  Assessment and Plan:   [name] is a 67 y.o. female with history of obesity, hyperlipidemia, chronic tobacco use, severe aortic stenosis, with intermittent right upper quadrant discomfort precipitated with fatty meals, epigastric pain, fatty  liver with transiently elevated LFTs after a fatty meal. Right upper quadrant ultrasound did not reveal evidence of cholelithiasis or choledocholithiasis.  Chronic right upper quadrant pain: Patient is scheduled for HIDA scan by Dr. Caryl Rios LFTs are currently normal She may had a mild episode of biliary colic Very less likely pancreatic etiology such as pancreatic cancer given the chronicity of pain which patient has been experiencing for the last 15 years and nonprogressive other than intermittent episodes  Epigastric pain: Schedule EGD for further evaluation after seeing cardiologist for aortic stenosis  Colon cancer screening: Negative Cologaurd test in 2018   Follow up in 3 months   Donna Darby, MD

## 2017-06-17 LAB — H. PYLORI BREATH TEST: H. PYLORI UBIT: NEGATIVE

## 2017-06-21 ENCOUNTER — Ambulatory Visit
Admission: RE | Admit: 2017-06-21 | Discharge: 2017-06-21 | Disposition: A | Discharge status: Home / Self Care | Payer: Medicare Other | Source: Ambulatory Visit | Attending: Oncology | Admitting: Oncology

## 2017-06-21 ENCOUNTER — Telehealth: Payer: Self-pay | Admitting: Family Medicine

## 2017-06-21 ENCOUNTER — Encounter
Admission: RE | Admit: 2017-06-21 | Discharge: 2017-06-21 | Disposition: A | Discharge status: Home / Self Care | Payer: Medicare Other | Source: Ambulatory Visit | Attending: Family Medicine | Admitting: Family Medicine

## 2017-06-21 DIAGNOSIS — I700 Atherosclerosis of aorta: Secondary | ICD-10-CM | POA: Insufficient documentation

## 2017-06-21 DIAGNOSIS — J432 Centrilobular emphysema: Secondary | ICD-10-CM | POA: Diagnosis not present

## 2017-06-21 DIAGNOSIS — R1011 Right upper quadrant pain: Secondary | ICD-10-CM

## 2017-06-21 DIAGNOSIS — I358 Other nonrheumatic aortic valve disorders: Secondary | ICD-10-CM | POA: Diagnosis not present

## 2017-06-21 DIAGNOSIS — Z122 Encounter for screening for malignant neoplasm of respiratory organs: Secondary | ICD-10-CM | POA: Diagnosis not present

## 2017-06-21 DIAGNOSIS — I350 Nonrheumatic aortic (valve) stenosis: Secondary | ICD-10-CM

## 2017-06-21 DIAGNOSIS — Z87891 Personal history of nicotine dependence: Secondary | ICD-10-CM | POA: Diagnosis not present

## 2017-06-21 HISTORY — DX: Right upper quadrant pain: R10.11

## 2017-06-21 MED ORDER — TECHNETIUM TC 99M MEBROFENIN IV KIT
5.0000 | PACK | Freq: Once | INTRAVENOUS | Status: AC | PRN
  Administered 2017-06-21: 5.495 via INTRAVENOUS

## 2017-06-21 NOTE — Telephone Encounter (Signed)
Request for referral

## 2017-06-21 NOTE — Telephone Encounter (Signed)
Pt went  To gastro appt last Wednesday. She needs a referral to a cardiologist. Please call pt at [phone], after 4:30 today or call cell. She will be going to other doctor's appts today.

## 2017-06-21 NOTE — Telephone Encounter (Signed)
Referral placed to cardiology 

## 2017-06-27 ENCOUNTER — Encounter: Payer: Self-pay | Admitting: *Deleted

## 2017-07-04 ENCOUNTER — Other Ambulatory Visit: Payer: Self-pay | Admitting: Family Medicine

## 2017-07-07 ENCOUNTER — Other Ambulatory Visit: Payer: Medicare Other

## 2017-07-12 ENCOUNTER — Encounter: Payer: Self-pay | Admitting: Family Medicine

## 2017-07-12 ENCOUNTER — Ambulatory Visit (INDEPENDENT_AMBULATORY_CARE_PROVIDER_SITE_OTHER): Payer: Medicare Other | Admitting: Family Medicine

## 2017-07-12 VITALS — BP 124/80 | HR 80 | Temp 98.7°F | Wt 207.5 lb

## 2017-07-12 DIAGNOSIS — J0140 Acute pansinusitis, unspecified: Secondary | ICD-10-CM | POA: Diagnosis not present

## 2017-07-12 MED ORDER — AMOXICILLIN-POT CLAVULANATE 875-125 MG PO TABS: 1.0000 | ORAL_TABLET | Freq: Two times a day (BID) | ORAL | 0 refills | Status: DC

## 2017-07-12 MED ORDER — PREDNISONE 10 MG PO TABS: ORAL_TABLET | ORAL | 0 refills | Status: DC

## 2017-07-12 NOTE — Patient Instructions (Signed)
Get rest, drink plenty of fluids, and use nasal saline rinses. A probiotic can be used when taking antibiotic and two weeks after. Mucinex DM can be used for symptoms if needed. You can continue Claritin also.  Follow up if fever >101, if symptoms worsen or if symptoms are not improving.    Sinusitis, Adult Sinusitis is soreness and inflammation of your sinuses. Sinuses are hollow spaces in the bones around your face. They are located:  Around your eyes.  In the middle of your forehead.  Behind your nose.  In your cheekbones.  Your sinuses and nasal passages are lined with a stringy fluid (mucus). Mucus normally drains out of your sinuses. When your nasal tissues get inflamed or swollen, the mucus can get trapped or blocked so air cannot flow through your sinuses. This lets bacteria, viruses, and funguses grow, and that leads to infection. Follow these instructions at home: Medicines  Take, use, or apply over-the-counter and prescription medicines only as told by your doctor. These may include nasal sprays.  If you were prescribed an antibiotic medicine, take it as told by your doctor. Do not stop taking the antibiotic even if you start to feel better. Hydrate and Humidify  Drink enough water to keep your pee (urine) clear or pale yellow.  Use a cool mist humidifier to keep the humidity level in your home above 50%.  Breathe in steam for 10-15 minutes, 3-4 times a day or as told by your doctor. You can do this in the bathroom while a hot shower is running.  Try not to spend time in cool or dry air. Rest  Rest as much as possible.  Sleep with your head raised (elevated).  Make sure to get enough sleep each night. General instructions  Put a warm, moist washcloth on your face 3-4 times a day or as told by your doctor. This will help with discomfort.  Wash your hands often with soap and water. If there is no soap and water, use hand sanitizer.  Do not smoke. Avoid being  around people who are smoking (secondhand smoke).  Keep all follow-up visits as told by your doctor. This is important. Contact a doctor if:  You have a fever.  Your symptoms get worse.  Your symptoms do not get better within 10 days. Get help right away if:  You have a very bad headache.  You cannot stop throwing up (vomiting).  You have pain or swelling around your face or eyes.  You have trouble seeing.  You feel confused.  Your neck is stiff.  You have trouble breathing. This information is not intended to replace advice given to you by your health care provider. Make sure you discuss any questions you have with your health care provider. Document Released: 06/30/2007 Document Revised: 09/07/2015 Document Reviewed: 11/06/2014 Elsevier Interactive Patient Education  [employer].

## 2017-07-12 NOTE — Progress Notes (Signed)
Patient ID: Donna Rios, female   DOB: 03-30-1950, 67 y.o.   MRN: [id number]  PCP: [name], MD  Subjective:  Donna Rios is a 67 y.o. year old very pleasant female patient who presents with symptoms including nasal congestion, sinus pressure, nasal purulence, cough that is productive of green sputum. Associated tooth discomfort. Sinus pressure is significant per patient.  -started: greater than one week ago , symptoms are not improving -previous treatments: Sudafed PE  has provided limited benefit -sick contacts/travel/risks: denies flu exposure.  -Hx of: allergies She is a daily smoker with 23 pack year history She completed Lung cancer CT 06/21/17 that indicated Lung-RADS 2, benign appearance or behavior. Advised annual screening.  No recent antibiotic use. Recent sick contact exposure of husband with similar symptoms.  ROS-denies fever, SOB, NVD  Pertinent Past Medical History-  Tobacco abuse, Venous insufficiency, chronic headaches.   Medications- reviewed  Current Outpatient Medications  Medication Sig Dispense Refill  . acetaminophen (TYLENOL) 650 MG CR tablet Take 650 mg by mouth.    . APPLE CIDER VINEGAR PO Take 1 tablet by mouth daily as needed.    Marland Kitchen buPROPion (WELLBUTRIN SR) 150 MG 12 hr tablet Take 150 mg (1 tablet) by mouth daily for 3 days, then take 150 mg (1 tablet) by mouth twice daily (Patient taking differently: 150 mg daily. Take 150 mg (1 tablet) by mouth daily for 3 days, then take 150 mg (1 tablet) by mouth twice daily) 60 tablet 3  . cholecalciferol (VITAMIN D) 400 units TABS tablet Take 2,000 Units by mouth.    . fluticasone (FLONASE) 50 MCG/ACT nasal spray Place 1 spray into both nostrils daily.    Marland Kitchen loratadine (CLARITIN) 10 MG tablet Take 10 mg by mouth daily.    . naphazoline-glycerin (CLEAR EYES) 0.012-0.2 % SOLN Apply to eye.    . rosuvastatin (CRESTOR) 20 MG tablet TAKE 1 TABLET BY MOUTH EVERY DAY 30 tablet 3   No current  facility-administered medications for this visit.     Objective: BP 124/80 (BP Location: Left Arm, Patient Position: Sitting, Cuff Size: Normal)   Pulse 80   Temp 98.7 F (37.1 C) (Oral)   Wt 207 lb 8 oz (94.1 kg)   SpO2 95%   BMI 31.55 kg/m  Gen: NAD, resting comfortably HEENT: Turbinates erythematous, TMs normal bilaterally, pharynx mildly erythematous with no exudate or edema, + maxillary and frontal sinus tenderness CV: RRR; murmur present;  Lungs: CTAB no crackles, wheeze, rhonchi Abdomen: soft/nontender/nondistended/normal bowel sounds. No rebound or guarding.  Ext: no edema Skin: warm, dry, no rash Neuro: grossly normal, moves all extremities  Assessment/Plan: 1. Acute pansinusitis, recurrence not specified Patient's symptoms most consistent with sinusitis. With significant sinus pressure and symptom duration, opted to treat with Augmentin today. She has a history of elevated liver enzymes so we opted to avoid acetaminophen. Provided a short taper of prednisone for symptom relief. Advised supportive measure such as hydration, nasal saline rinses, and mucinex DM if needed for symptoms. She will continue OTC Claritin. Discussed tobacco cessation and she is not interested in quitting at this time.  Advised long term use of decongestants. Follow up if no improvement, worsening symptoms, or fever >101. Return precautions provided.   - amoxicillin-clavulanate (AUGMENTIN) 875-125 MG tablet; Take 1 tablet by mouth 2 (two) times daily.  Dispense: 20 tablet; Refill: 0  Finally, we reviewed reasons to return to care including if symptoms worsen or persist or new concerns arise- once again  particularly shortness of breath or fever.   Laurita Quint, FNP

## 2017-08-10 ENCOUNTER — Ambulatory Visit (INDEPENDENT_AMBULATORY_CARE_PROVIDER_SITE_OTHER): Payer: Medicare Other

## 2017-08-10 ENCOUNTER — Ambulatory Visit
Admission: RE | Admit: 2017-08-10 | Discharge: 2017-08-10 | Disposition: A | Discharge status: Home / Self Care | Payer: Medicare Other | Source: Ambulatory Visit | Attending: Family Medicine | Admitting: Family Medicine

## 2017-08-10 ENCOUNTER — Ambulatory Visit (INDEPENDENT_AMBULATORY_CARE_PROVIDER_SITE_OTHER): Payer: Medicare Other | Admitting: Family Medicine

## 2017-08-10 ENCOUNTER — Encounter: Payer: Self-pay | Admitting: Family Medicine

## 2017-08-10 VITALS — BP 120/72 | HR 87 | Temp 98.2°F | Ht 68.0 in | Wt 205.4 lb

## 2017-08-10 DIAGNOSIS — M1612 Unilateral primary osteoarthritis, left hip: Secondary | ICD-10-CM | POA: Diagnosis not present

## 2017-08-10 DIAGNOSIS — J0110 Acute frontal sinusitis, unspecified: Secondary | ICD-10-CM | POA: Diagnosis not present

## 2017-08-10 DIAGNOSIS — M8580 Other specified disorders of bone density and structure, unspecified site: Secondary | ICD-10-CM | POA: Diagnosis not present

## 2017-08-10 DIAGNOSIS — M7542 Impingement syndrome of left shoulder: Secondary | ICD-10-CM

## 2017-08-10 DIAGNOSIS — Z13820 Encounter for screening for osteoporosis: Secondary | ICD-10-CM | POA: Diagnosis not present

## 2017-08-10 DIAGNOSIS — Z87891 Personal history of nicotine dependence: Secondary | ICD-10-CM | POA: Insufficient documentation

## 2017-08-10 DIAGNOSIS — J329 Chronic sinusitis, unspecified: Secondary | ICD-10-CM | POA: Insufficient documentation

## 2017-08-10 DIAGNOSIS — G8929 Other chronic pain: Secondary | ICD-10-CM

## 2017-08-10 DIAGNOSIS — M810 Age-related osteoporosis without current pathological fracture: Secondary | ICD-10-CM | POA: Insufficient documentation

## 2017-08-10 DIAGNOSIS — Z780 Asymptomatic menopausal state: Secondary | ICD-10-CM | POA: Insufficient documentation

## 2017-08-10 DIAGNOSIS — M25552 Pain in left hip: Secondary | ICD-10-CM | POA: Diagnosis not present

## 2017-08-10 DIAGNOSIS — F419 Anxiety disorder, unspecified: Secondary | ICD-10-CM

## 2017-08-10 DIAGNOSIS — F329 Major depressive disorder, single episode, unspecified: Secondary | ICD-10-CM

## 2017-08-10 DIAGNOSIS — M85851 Other specified disorders of bone density and structure, right thigh: Secondary | ICD-10-CM | POA: Diagnosis not present

## 2017-08-10 DIAGNOSIS — E785 Hyperlipidemia, unspecified: Secondary | ICD-10-CM | POA: Diagnosis not present

## 2017-08-10 MED ORDER — DOXYCYCLINE HYCLATE 100 MG PO TABS: 100.0000 mg | ORAL_TABLET | Freq: Two times a day (BID) | ORAL | 0 refills | Status: DC

## 2017-08-10 NOTE — Patient Instructions (Addendum)
Nice to see you. I have provided you with a doxycycline prescription to fill if you continue to have symptoms of a sinus infection after 3 to 4 days.  If you develop cough productive of blood, shortness of breath, or fevers you should be evaluated rather than filling the prescription.  We will get an x-ray of your left hip. I have provided you with some exercises to do for your shoulder.  If it is not improving please let us know.   Shoulder Impingement Syndrome Rehab Ask your health care provider which exercises are safe for you. Do exercises exactly as told by your health care provider and adjust them as directed. It is normal to feel mild stretching, pulling, tightness, or discomfort as you do these exercises, but you should stop right away if you feel sudden pain or your pain gets worse.Do not begin these exercises until told by your health care provider. Stretching and range of motion exercise This exercise warms up your muscles and joints and improves the movement and flexibility of your shoulder. This exercise also helps to relieve pain and stiffness. Exercise A: Passive horizontal adduction  1. Sit or stand and pull your left / right elbow across your chest, toward your other shoulder. Stop when you feel a gentle stretch in the back of your shoulder and upper arm. ? Keep your arm at shoulder height. ? Keep your arm as close to your body as you comfortably can. 2. Hold for __________ seconds. 3. Slowly return to the starting position. Repeat __________ times. Complete this exercise __________ times a day. Strengthening exercises These exercises build strength and endurance in your shoulder. Endurance is the ability to use your muscles for a long time, even after they get tired. Exercise B: External rotation, isometric 1. Stand or sit in a doorway, facing the door frame. 2. Bend your left / right elbow and place the back of your wrist against the door frame. Only your wrist should be  touching the frame. Keep your upper arm at your side. 3. Gently press your wrist against the door frame, as if you are trying to push your arm away from your abdomen. ? Avoid shrugging your shoulder while you press your hand against the door frame. Keep your shoulder blade tucked down toward the middle of your back. 4. Hold for __________ seconds. 5. Slowly release the tension, and relax your muscles completely before you do the exercise again. Repeat __________ times. Complete this exercise __________ times a day. Exercise C: Internal rotation, isometric  1. Stand or sit in a doorway, facing the door frame. 2. Bend your left / right elbow and place the inside of your wrist against the door frame. Only your wrist should be touching the frame. Keep your upper arm at your side. 3. Gently press your wrist against the door frame, as if you are trying to push your arm toward your abdomen. ? Avoid shrugging your shoulder while you press your hand against the door frame. Keep your shoulder blade tucked down toward the middle of your back. 4. Hold for __________ seconds. 5. Slowly release the tension, and relax your muscles completely before you do the exercise again. Repeat __________ times. Complete this exercise __________ times a day. Exercise D: Scapular protraction, supine  1. Lie on your back on a firm surface. Hold a __________ weight in your left / right hand. 2. Raise your left / right arm straight into the air so your hand is directly above your  shoulder joint. 3. Push the weight into the air so your shoulder lifts off of the surface that you are lying on. Do not move your head, neck, or back. 4. Hold for __________ seconds. 5. Slowly return to the starting position. Let your muscles relax completely before you repeat this exercise. Repeat __________ times. Complete this exercise __________ times a day. Exercise E: Scapular retraction  1. Sit in a stable chair without armrests, or  stand. 2. Secure an exercise band to a stable object in front of you so the band is at shoulder height. 3. Hold one end of the exercise band in each hand. Your palms should face down. 4. Squeeze your shoulder blades together and move your elbows slightly behind you. Do not shrug your shoulders while you do this. 5. Hold for __________ seconds. 6. Slowly return to the starting position. Repeat __________ times. Complete this exercise __________ times a day. Exercise F: Shoulder extension  1. Sit in a stable chair without armrests, or stand. 2. Secure an exercise band to a stable object in front of you where the band is above shoulder height. 3. Hold one end of the exercise band in each hand. 4. Straighten your elbows and lift your hands up to shoulder height. 5. Squeeze your shoulder blades together and pull your hands down to the sides of your thighs. Stop when your hands are straight down by your sides. Do not let your hands go behind your body. 6. Hold for __________ seconds. 7. Slowly return to the starting position. Repeat __________ times. Complete this exercise __________ times a day. This information is not intended to replace advice given to you by your health care provider. Make sure you discuss any questions you have with your health care provider. Document Released: 01/11/2005 Document Revised: 09/18/2015 Document Reviewed: 12/14/2014 Elsevier Interactive Patient Education  [employer].

## 2017-08-10 NOTE — Assessment & Plan Note (Signed)
Patient with prior sinusitis.  Current symptoms have not been going on long enough to be considered sinusitis though she has progressed in the past.  I provided her with a prescription of doxycycline to use after the next 3 or 4 days if her symptoms progress to purulent mucus and sinusitis symptoms.  If they do not progress she will not take the antibiotic.  I discussed return precautions and advised if those recurred she should be evaluated instead of taking the antibiotic.

## 2017-08-10 NOTE — Assessment & Plan Note (Signed)
Suspect likely arthritis.  Exam would seem to indicate this.  No hernia findings with chaperoned exam.  We will obtain an x-ray of her left hip.  Consider evaluation with ultrasound for hernia if the x-ray is unremarkable.

## 2017-08-10 NOTE — Assessment & Plan Note (Signed)
Suspect left shoulder discomfort is related to rotator cuff impingement.  Discussed exercises for this.  If not improving she will let us know.

## 2017-08-10 NOTE — Assessment & Plan Note (Signed)
Continue Crestor 

## 2017-08-10 NOTE — Progress Notes (Signed)
[name], MD Phone: [phone]  Donna Rios is a 67 y.o. female who presents today for follow-up.  CC: Depression/anxiety, hyperlipidemia, left shoulder pain, sinusitis, left hip pain  Sinusitis: Patient was treated for sinusitis with Augmentin several weeks ago.  She had improved though over the last day or so has had recurrence of symptoms with postnasal drip, some cough related to that, temperature of 99.6 F.  She is yellow mucus out of her nose.  Has not started to have a significant amount of congestion.  She tried Mucinex.  She did feel like her heart rate went up while coughing when she was sick.  Left anterior hip discomfort: She notes this is been going on for some time now.  She will get a discomfort sometimes when she is lying down and sometimes when she is walking around.  No abdominal discomfort.  She wondered if she has a hernia.  Left shoulder pain: Patient notes since she got the pneumonia shot she has had a discomfort in her left shoulder particularly when she moves it in certain directions.  No right shoulder symptoms.  Hyperlipidemia: Taking Crestor.  No chest pain or shortness of breath.  No myalgias.  Depression anxiety: Her depression has improved.  Her niece is not moving and that was causing some of her issues.  She does have some anxiety as her husband was hospitalized for A. fib.  She is taking Wellbutrin.  No SI.  Social History   Tobacco Use  Smoking Status Current Every Day Smoker  . Packs/day: 0.50  . Years: 46.00  . Pack years: 23.00  . Types: Cigarettes  Smokeless Tobacco Never Used     ROS see history of present illness  Objective  Physical Exam Vitals:   08/10/17 1019  BP: 120/72  Pulse: 87  Temp: 98.2 F (36.8 C)  SpO2: 98%    BP Readings from Last 3 Encounters:  08/10/17 120/72  07/12/17 124/80  06/16/17 122/72   Wt Readings from Last 3 Encounters:  08/10/17 205 lb 6.4 oz (93.2 kg)  07/12/17 207 lb 8 oz (94.1 kg)    06/21/17 213 lb (96.6 kg)    Physical Exam  Constitutional: No distress.  HENT:  Head: Normocephalic and atraumatic.  Mouth/Throat: Oropharynx is clear and moist.  Normal TMs  Eyes: Pupils are equal, round, and reactive to light. Conjunctivae are normal.  Neck: Neck supple.  Cardiovascular: Normal rate, regular rhythm and normal heart sounds.  Pulmonary/Chest: Effort normal and breath sounds normal.  Musculoskeletal: She exhibits no edema.  Left shoulder with no tenderness, there is discomfort on external rotation and abduction actively and passively and she has discomfort on internal rotation actively, positive empty can on the left, right shoulder with no tenderness, no discomfort on active or passive range of motion, negative empty can, slight discomfort over anterior left hip with no palpable underlying abnormalities, no palpable hernias, she does have discomfort on internal rotation and has decreased range of motion on internal rotation, no tenderness of the left lower quadrant or suprapubic area  Lymphadenopathy:    She has no cervical adenopathy.  Neurological: She is alert.  Skin: Skin is warm and dry. She is not diaphoretic.     Assessment/Plan: Please see individual problem list.  Hyperlipidemia Continue Crestor.  Sinusitis Patient with prior sinusitis.  Current symptoms have not been going on long enough to be considered sinusitis though she has progressed in the past.  I provided her with a prescription of  doxycycline to use after the next 3 or 4 days if her symptoms progress to purulent mucus and sinusitis symptoms.  If they do not progress she will not take the antibiotic.  I discussed return precautions and advised if those recurred she should be evaluated instead of taking the antibiotic.  Chronic left hip pain Suspect likely arthritis.  Exam would seem to indicate this.  No hernia findings with chaperoned exam.  We will obtain an x-ray of her left hip.  Consider  evaluation with ultrasound for hernia if the x-ray is unremarkable.  Rotator cuff impingement syndrome of left shoulder Suspect left shoulder discomfort is related to rotator cuff impingement.  Discussed exercises for this.  If not improving she will let us know.  Anxiety and depression Relatively well-controlled.  She will continue Wellbutrin.  Given return precautions.   Orders Placed This Encounter  Procedures  . DG HIP UNILAT WITH PELVIS 2-3 VIEWS LEFT    Standing Status:   Future    Number of Occurrences:   1    Standing Expiration Date:   10/12/2018    Order Specific Question:   Reason for Exam (SYMPTOM  OR DIAGNOSIS REQUIRED)    Answer:   chronic left hip pain, no recent injury    Order Specific Question:   Preferred imaging location?    Answer:   [facility] Specific Question:   Radiology Contrast Protocol - do NOT remove file path    Answer:   [email or web address]    Meds ordered this encounter  Medications  . doxycycline (VIBRA-TABS) 100 MG tablet    Sig: Take 1 tablet (100 mg total) by mouth 2 (two) times daily.    Dispense:  14 tablet    Refill:  0     [name], MD [place]

## 2017-08-10 NOTE — Assessment & Plan Note (Signed)
Relatively well-controlled.  She will continue Wellbutrin.  Given return precautions.

## 2017-08-13 ENCOUNTER — Other Ambulatory Visit: Payer: Self-pay | Admitting: Family Medicine

## 2017-08-13 DIAGNOSIS — M1612 Unilateral primary osteoarthritis, left hip: Secondary | ICD-10-CM

## 2017-08-16 DIAGNOSIS — I350 Nonrheumatic aortic (valve) stenosis: Secondary | ICD-10-CM | POA: Insufficient documentation

## 2017-08-16 NOTE — Progress Notes (Signed)
Cardiology Office Note  Date:  08/17/2017   ID:  REISA COPPOLA, DOB Aug 07, 1950, MRN [id number]  PCP:  [name], MD   Chief Complaint  Patient presents with  . other    Ref by Dr. [name] for severe aortic stenosis from Echo done on 06/08/2017. Pt. needs a cardiac clearance to have a colonoscopy and endoscopy. Meds reviewed by the pt. verbally. Pt. c/o rapid heart beats at times.     HPI:  Ms. Ia Leeb is a 46 short woman with past medical history of Smoking hyperlipidemia Referred by Dr. [name] for severe aortic valve stenosis seen on echocardiogram  Recent echocardiogram results reviewed with her Echocardiogram May 2019 Normal ejection fraction Mean gradient 46 mmHg Peak gradient 73 mmHg Take velocity 432 cm per sec and Valve area less than 1 cm  Retired, Does garden work, Worked in a [employer] for 32 yrs Does  Hoeing in the garden,  occassional stars getting up to fast Having problems with feet, 6 surgery on left foot, walks slower but still very active Has not really noted major sx from valve Denies significant shortness of breath or chest pressure on exertion Always been told she had a murmur  Smokes <1 pp  Recently Been sick with sinusitis,  Needs EGD or colo, Dr. Sherri Sear, per the patient needs routine surveillance Had some flank pain , etiology unclear  EKG personally reviewed by myself on todays visit Shows NSR with rate 75 bpm no significant ST or T-wave changes   PMH:   has a past medical history of Allergic rhinitis, Arthritis, Heart murmur, Phlebitis, RUQ pain (06/21/2017), and Stress incontinence.  Aortic valve stenosis Smoker  PSH:    Past Surgical History:  Procedure Laterality Date  . BREAST BIOPSY Left    ducts removed  . Cataract surgery    . FOOT SURGERY     7  . LAPAROSCOPIC HYSTERECTOMY    . VARICOSE VEIN SURGERY      Current Outpatient Medications  Medication Sig Dispense Refill  . acetaminophen (TYLENOL) 650  MG CR tablet Take 650 mg by mouth.    . APPLE CIDER VINEGAR PO Take 1 tablet by mouth daily as needed.    Marland Kitchen buPROPion (WELLBUTRIN SR) 150 MG 12 hr tablet Take 150 mg (1 tablet) by mouth daily for 3 days, then take 150 mg (1 tablet) by mouth twice daily (Patient taking differently: 150 mg daily. Take 150 mg (1 tablet) by mouth daily for 3 days, then take 150 mg (1 tablet) by mouth twice daily) 60 tablet 3  . cholecalciferol (VITAMIN D) 400 units TABS tablet Take 2,000 Units by mouth.    . doxycycline (VIBRA-TABS) 100 MG tablet Take 1 tablet (100 mg total) by mouth 2 (two) times daily. 14 tablet 0  . fluticasone (FLONASE) 50 MCG/ACT nasal spray Place 1 spray into both nostrils daily.    Marland Kitchen loratadine (CLARITIN) 10 MG tablet Take 10 mg by mouth daily.    . naphazoline-glycerin (CLEAR EYES) 0.012-0.2 % SOLN Apply to eye.    . Probiotic Product (PROBIOTIC ADVANCED PO) Take by mouth.    . rosuvastatin (CRESTOR) 20 MG tablet TAKE 1 TABLET BY MOUTH EVERY DAY 30 tablet 3  . sodium chloride (OCEAN) 0.65 % SOLN nasal spray Place 1 spray into both nostrils as needed for congestion.     No current facility-administered medications for this visit.      Allergies:   Ibuprofen and Asa [aspirin]   Social  History:  The patient  reports that she has been smoking cigarettes.  She has a 23.00 pack-year smoking history. She has never used smokeless tobacco. She reports that she does not drink alcohol or use drugs.   Family History:   family history includes Alcoholism in her unknown relative; Arthritis in her unknown relative; Diabetes in her mother and unknown relative; Heart disease in her unknown relative; Heart failure in her father and mother; Hypertension in her mother and unknown relative; Hypotension in her father; Lung cancer in her unknown relative; Stroke in her unknown relative.    Review of Systems: Review of Systems  Constitutional: Negative.   Respiratory: Negative.   Cardiovascular: Negative.    Gastrointestinal: Negative.   Musculoskeletal: Negative.   Neurological: Negative.   Psychiatric/Behavioral: Negative.   All other systems reviewed and are negative.   PHYSICAL EXAM: VS:  BP 132/76 (BP Location: Right Arm, Patient Position: Sitting, Cuff Size: Normal)   Pulse 75   Ht 5' 8.5" (1.74 m)   Wt 205 lb 12 oz (93.3 kg)   BMI 30.83 kg/m  , BMI Body mass index is 30.83 kg/m. GEN: Well nourished, well developed, in no acute distress  HEENT: normal  Neck: no JVD, carotid bruits, or masses Cardiac: RRR; 2-3/6 SEM RSB, no rubs, or gallops,no edema  Respiratory:  clear to auscultation bilaterally, normal work of breathing GI: soft, nontender, nondistended, + BS MS: no deformity or atrophy  Skin: warm and dry, no rash Neuro:  Strength and sensation are intact Psych: euthymic mood, full affect    Recent Labs: 05/09/2017: BUN 14; Creatinine, Ser 0.69; Hemoglobin 13.6; Platelets 211.0; Potassium 5.0; Sodium 139; TSH 0.87 06/07/2017: ALT 11    Lipid Panel Lab Results  Component Value Date   CHOL 152 05/09/2017   HDL 45.80 05/09/2017   TRIG 243.0 (H) 05/09/2017      Wt Readings from Last 3 Encounters:  08/17/17 205 lb 12 oz (93.3 kg)  08/10/17 205 lb 6.4 oz (93.2 kg)  07/12/17 207 lb 8 oz (94.1 kg)       ASSESSMENT AND PLAN:  Severe aortic valve stenosis - Plan: EKG 12-Lead Long discussion with her concerning recent echocardiogram results She reports that she is relatively asymptomatic and still active We discussed various treatment options for her aortic valve She has indicated she would prefer TAVR if available As she is asymptomatic we have recommended follow-up in 6 months time We have recommended cardiac catheterization in the near future even before her next visit if possible Discussed possibilities if she does have underlying coronary artery disease and the need for open heart surgery Discuss the team in [facility] that would need to evaluate her prior  to any intervention Need for CT scans if indicated --we did discuss that she is not currently a candidate for TAVR as she is a low risk individual, though this could change in the next 6 months or so and TAVR might be offered a later date  Tobacco abuse We have encouraged her to continue to work on weaning her cigarettes and smoking cessation. She will continue to work on this and does not want any assistance with chantix.   Prediabetes We have encouraged continued exercise, careful diet management in an effort to lose weight.  Mixed hyperlipidemia Recommended that she continue on her Crestor Total cholesterol 150  Disposition:   F/U  6 months   Total encounter time more than 60 minutes  Greater than 50% was spent in counseling  and coordination of care with the patient    Orders Placed This Encounter  Procedures  . EKG 12-Lead     Signed, Esmond Plants, M.D., Ph.D. 08/17/2017  [place], [place]

## 2017-08-17 ENCOUNTER — Encounter: Payer: Self-pay | Admitting: Cardiovascular Disease

## 2017-08-17 ENCOUNTER — Encounter

## 2017-08-17 ENCOUNTER — Ambulatory Visit (INDEPENDENT_AMBULATORY_CARE_PROVIDER_SITE_OTHER): Payer: Medicare Other | Admitting: Cardiovascular Disease

## 2017-08-17 VITALS — BP 132/76 | HR 75 | Ht 68.5 in | Wt 205.8 lb

## 2017-08-17 DIAGNOSIS — Z720 Tobacco use: Secondary | ICD-10-CM

## 2017-08-17 DIAGNOSIS — E782 Mixed hyperlipidemia: Secondary | ICD-10-CM

## 2017-08-17 DIAGNOSIS — I350 Nonrheumatic aortic (valve) stenosis: Secondary | ICD-10-CM

## 2017-08-17 DIAGNOSIS — R7303 Prediabetes: Secondary | ICD-10-CM | POA: Diagnosis not present

## 2017-08-17 NOTE — Patient Instructions (Addendum)
Call when you are ready for cardiac catheter in preparation for valve workup  TAVR   Medication Instructions:   No medication changes made  Labwork:  No new labs needed  Testing/Procedures:  No further testing at this time   Follow-Up: It was a pleasure seeing you in the office today. Please call us if you have new issues that need to be addressed before your next appt.  [phone]  Your physician wants you to follow-up in: 6 months.  You will receive a reminder letter in the mail two months in advance. If you don't receive a letter, please call our office to schedule the follow-up appointment.  If you need a refill on your cardiac medications before your next appointment, please call your pharmacy.  For educational health videos Log in to : [email or web address] Or : [email or web address], password : [email or web address]  Transcatheter Aortic Valve Replacement Transcatheter aortic valve replacement (TAVR) is a procedure to place an artificial aortic valve in the heart. The aortic valve controls blood flow between the heart and the main blood vessel that supplies blood to the body (aorta). During TAVR, a flexible tube (catheter) is placed through an incision in the groin, chest, or neck and is guided to the aortic valve. Then, the artificial valve is moved through the catheter and into the aortic valve, where it is expanded. The new valve takes over the function of the old valve, and it improves blood flow through the heart. This procedure is also called transcatheter aortic valve implantation (TAVI). You may need TAVR if you have a stiff aortic valve (aortic stenosis) that is causing symptoms and open heart valve replacement surgery is not an option for you. Tell a health care provider about:  Any allergies you have.  All medicines you are taking, including vitamins, herbs, eye drops, creams, and over-the-counter medicines.  Any problems you or family members have had with anesthetic  medicines.  Any blood disorders you have.  Any surgeries you have had.  Any medical conditions you have.  Whether you are pregnant or may be pregnant. What are the risks? Generally, this is a safe procedure. However, problems may occur, including:  Bleeding.  Damage to blood vessels or the heart.  Stroke.  Blood clots.  Abnormal heart rhythms (arrhythmia).  Heart attack.  Allergic reactions to medicines or dyes.  Infection.  Kidney failure.  Failure of the artificial valve.  What happens before the procedure? Medicines  Ask your health care provider about: ? Changing or stopping your regular medicines. This is especially important if you are taking diabetes medicines or blood thinners. ? Taking medicines such as aspirin and ibuprofen. These medicines can thin your blood. Do not take these medicines before your procedure if your health care provider instructs you not to.  You may be given antibiotic medicine to help prevent infection.  You may need to take blood thinners before surgery. If so, take these medicines as directed. Staying hydrated Follow instructions from your health care provider about hydration, which may include:  Up to 2 hours before the procedure - you may continue to drink clear liquids, such as water, clear fruit juice, black coffee, and plain tea.  Eating and drinking restrictions Follow instructions from your health care provider about eating and drinking, which may include:  8 hours before the procedure - stop eating heavy meals or foods such as meat, fried foods, or fatty foods.  6 hours before the procedure - stop eating light meals or foods, such as  toast or cereal.  6 hours before the procedure - stop drinking milk or drinks that contain milk.  2 hours before the procedure - stop drinking clear liquids.  General instructions  Do not use any products that contain nicotine or tobacco for as long as possible before your procedure.  These include cigarettes and e-cigarettes. If you need help quitting, ask your health care provider.  You may have a blood or urine sample taken.  You may have tests, such as: ? Echocardiogram. This is an ultrasound scan of the heart. ? CT scan. ? Cardiac catheterization. During this procedure, a catheter is inserted into a blood vessel and guided into your heart to check pressures and take images of the heart. ? Lung function tests.  Plan to have someone take you home from the hospital. What happens during the procedure?  To lower your risk of infection: ? Your health care team will wash or sanitize their hands. ? Your skin will be washed with soap.  IV tubes will be inserted into veins in your arms.  You will be given one or more of the following: ? A medicine to help you relax (sedative). ? A medicine to numb the area (local anesthetic). ? A medicine to make you fall asleep (general anesthetic).  Small incisions will be made in one or more of the following places: ? Your groin. This is the most common. ? Your neck. ? In your chest, over your breastbone (sternum). ? The left side of your chest.  Catheters will be threaded into your incisions and into blood vessels that lead to your heart. If your incision is in the left side of your chest, a catheter will be placed directly into your heart. Catheters will be used to monitor your heartbeat and regulate it if necessary (pacemaker).  Ongoing X-ray images (fluoroscopy) will be used to guide the catheters to the right places in your heart.  A wire may be placed through the catheter inside your aortic valve. A balloon at the end of this catheter may be inflated to open your aortic valve. This wire will then be removed.  Another wire will be placed through the catheter inside your aortic valve. The wire will be used to help position and inflate your artificial valve. Then, the wire will be removed.  Tests will be done to make sure  your new valve is working and your heart is functioning properly.  Your incisions will be closed with stitches (sutures), skin glue, or adhesive strips.  If you had an incision in your chest wall, you may have a chest tube placed to keep your lung from collapsing.  Bandages (dressings) will be placed over your incisions. The procedure may vary among health care providers and hospitals. What happens after the procedure?  Your blood pressure, heart rate, breathing rate, and blood oxygen level will be monitored.  You may have to wear compression stockings. These stockings help to prevent blood clots and reduce swelling in your legs.  You may need to lie still in bed for several hours. Once you are allowed to get up, you will be encouraged to move around.  If you had a chest incision, you will have some chest pain. You will be given pain medicine as needed.  You may continue to receive fluids and medicines through an IV tube.  You will be given blood thinners.  You will be shown how to do breathing exercises to prevent lung infection.  You may continue to  have a chest tube draining fluid from the surgical area.  Do not drive for 24 hours if you were given a sedative. Summary  Transcatheter aortic valve replacement (TAVR) is a procedure to place an artificial aortic valve in the heart.  You may need TAVR if you have a stiff aortic valve (aortic stenosis) that is causing symptoms and open heart valve replacement surgery is not an option for you.  After the procedure, you may need to lie still in bed for several hours. Once you are allowed to get up, you will be encouraged to move around. This information is not intended to replace advice given to you by your health care provider. Make sure you discuss any questions you have with your health care provider. Document Released: 12/01/2015 Document Revised: 12/01/2015 Document Reviewed: 12/01/2015 Elsevier Interactive Patient Education   [employer].

## 2017-09-05 ENCOUNTER — Other Ambulatory Visit: Payer: Self-pay | Admitting: Family Medicine

## 2017-09-05 NOTE — Telephone Encounter (Signed)
Last OV 08/10/17 last filled 05/09/17 60 [id number]

## 2017-09-13 ENCOUNTER — Ambulatory Visit: Payer: Medicare Other | Admitting: Gastroenterology

## 2017-09-13 ENCOUNTER — Other Ambulatory Visit: Payer: Self-pay

## 2017-09-13 ENCOUNTER — Encounter: Payer: Self-pay | Admitting: Gastroenterology

## 2017-09-13 VITALS — BP 112/75 | HR 90 | Resp 17 | Ht 68.5 in | Wt 209.4 lb

## 2017-09-13 DIAGNOSIS — Z1211 Encounter for screening for malignant neoplasm of colon: Secondary | ICD-10-CM

## 2017-09-13 NOTE — Progress Notes (Signed)
[name], MD [address]  [place]  [place], [place] [address]  Main: [phone]  Fax: [phone]    Gastroenterology Consultation  Referring Provider:     [name], MD Primary Care Physician:  [name], MD Primary Gastroenterologist:  Dr. [name] Reason for Consultation:     Fatty liver, chronic right upper quadrant pain        HPI:   Donna Rios is a 67 y.o. female referred by Dr. [name], [name], MD  for consultation & management of fatty liver. Patient had an episode of right upper quadrant pain lasted for about a week after a fatty meal in 04/2017. She saw Dr. [name] who checked LFTs which revealed elevated alkaline phosphatase and AST. She subsequently underwent ultrasound liver which revealed hepatic steatosis. Normal gallbladder and normal CBD. Repeat LFTs came back normal as of 06/07/2017. Apparently, patient reports that she has been dealing with several years history of mild right upper quadrant discomfort after eating fatty meals and radiates to right subcostal region. She has intentionally cut back on high carbs diet, red meat, fatty foods that resulted in some weight loss. She also reports mild epigastric discomfort. She denies nausea, vomiting, fever, chills. She stopped meloxicam about a month ago when she found out about elevated LFTs. HCV antibody negative. She reports having had an upper endoscopy and a colonoscopy about 15 years ago by Dr. [name] for evaluation of right upper quadrant pain as well as screening for colon cancer.These procedures were reportedly normal according to the patient She is a chronic smoker, started since age of 32 She is also recently found to have severe aortic stenosis and awaiting to see cardiologist.  Follow up visit 09/13/2017 She was seen by the cardiologist, Dr.Gollan who recommended observation/watchful waiting for next 6 months as patient is asymptomatic and otherwise healthy. She does  report mild, ongoing right upper quadrant/right lateral discomfort. She had HIDA scan which is unremarkable. She reports that eating fatty foods recently made the pain worse. Her symptoms are associated with mild bloating and gas. She takes probiotics daily which helps to keep her bowels regular. She lost about 10 pounds but gained 4 pounds back.  NSAIDs: none  Antiplts/Anticoagulants/Anti thrombotics: none  GI Procedures: EGD and colonoscopy by Dr. [name], reportedly normal Colo-guard test negative in 2018  Past Medical History:  Diagnosis Date  . Allergic rhinitis   . Arthritis   . Heart murmur   . Phlebitis   . RUQ pain 06/21/2017   From the NM Hepato w Eject Fract order  . Stress incontinence     Past Surgical History:  Procedure Laterality Date  . BREAST BIOPSY Left    ducts removed  . Cataract surgery    . FOOT SURGERY     7  . LAPAROSCOPIC HYSTERECTOMY    . VARICOSE VEIN SURGERY      Current Outpatient Medications:  .  APPLE CIDER VINEGAR PO, Take 1 tablet by mouth daily as needed., Disp: , Rfl:  .  buPROPion (WELLBUTRIN SR) 150 MG 12 hr tablet, Take 1 tablet (150 mg total) by mouth 2 (two) times daily., Disp: 60 tablet, Rfl: 3 .  cholecalciferol (VITAMIN D) 400 units TABS tablet, Take 2,000 Units by mouth., Disp: , Rfl:  .  fluticasone (FLONASE) 50 MCG/ACT nasal spray, Place 1 spray into both nostrils daily., Disp: , Rfl:  .  loratadine (CLARITIN) 10 MG tablet, Take 10 mg by mouth daily.,  Disp: , Rfl:  .  naphazoline-glycerin (CLEAR EYES) 0.012-0.2 % SOLN, Apply to eye., Disp: , Rfl:  .  Probiotic Product (PROBIOTIC ADVANCED PO), Take by mouth., Disp: , Rfl:  .  rosuvastatin (CRESTOR) 20 MG tablet, TAKE 1 TABLET BY MOUTH EVERY DAY, Disp: 30 tablet, Rfl: 3 .  sodium chloride (OCEAN) 0.65 % SOLN nasal spray, Place 1 spray into both nostrils as needed for congestion., Disp: , Rfl:  .  acetaminophen (TYLENOL) 650 MG CR tablet, Take 650 mg by mouth., Disp: , Rfl:  .   doxycycline (VIBRA-TABS) 100 MG tablet, Take 1 tablet (100 mg total) by mouth 2 (two) times daily. (Patient not taking: Reported on 09/13/2017), Disp: 14 tablet, Rfl: 0   Family History  Problem Relation Age of Onset  . Alcoholism Unknown   . Arthritis Unknown   . Lung cancer Unknown   . Heart disease Unknown   . Stroke Unknown   . Hypertension Unknown   . Diabetes Unknown   . Heart failure Mother   . Hypertension Mother   . Diabetes Mother   . Heart failure Father   . Hypotension Father   . Breast cancer Neg Hx      Social History   Tobacco Use  . Smoking status: Current Every Day Smoker    Packs/day: 0.50    Years: 46.00    Pack years: 23.00    Types: Cigarettes  . Smokeless tobacco: Never Used  Substance Use Topics  . Alcohol use: No    Alcohol/week: 0.0 standard drinks  . Drug use: No    Allergies as of 09/13/2017 - Review Complete 09/13/2017  Allergen Reaction Noted  . Ibuprofen Other (See Comments) 09/17/2015  . Asa [aspirin] Other (See Comments) 06/11/2015    Review of Systems:    All systems reviewed and negative except where noted in HPI.   Physical Exam:  BP 112/75 (BP Location: Left Arm, Patient Position: Sitting, Cuff Size: Large)   Pulse 90   Resp 17   Ht 5' 8.5" (1.74 m)   Wt 209 lb 6.4 oz (95 kg)   BMI 31.38 kg/m  No LMP recorded. Patient has had a hysterectomy.  General:   Alert,  Well-developed, well-nourished, pleasant and cooperative in NAD Head:  Normocephalic and atraumatic. Eyes:  Sclera clear, no icterus.   Conjunctiva pink. Ears:  Normal auditory acuity. Nose:  No deformity, discharge, or lesions. Mouth:  No deformity or lesions,oropharynx pink & moist. Neck:  Supple; no masses or thyromegaly. Lungs:  Respirations even and unlabored.  Clear throughout to auscultation.   No wheezes, crackles, or rhonchi. No acute distress. Heart:  Regular rate and rhythm; systolic murmurs, clicks, rubs, or gallops. Abdomen:  Normal bowel sounds.  Soft, reproducible pain in the right lateral subcostal area and non-distended without masses, hepatosplenomegaly or hernias noted.  No guarding or rebound tenderness.   Rectal: Not performed Msk:  Symmetrical without gross deformities. Good, equal movement & strength bilaterally. Pulses:  Normal pulses noted. Extremities:  No clubbing or edema.  No cyanosis. Neurologic:  Alert and oriented x3;  grossly normal neurologically. Skin:  Intact without significant lesions or rashes. No jaundice. Psych:  Alert and cooperative. Normal mood and affect.  Imaging Studies: Abdominal imaging reviewed  Assessment and Plan:   KAMBRI DISMORE is a 67 y.o. female with history of obesity, hyperlipidemia, chronic tobacco use, severe aortic stenosis, with intermittent right upper quadrant discomfort precipitated with fatty meals, epigastric pain, fatty liver with transiently  elevated LFTs after a fatty meal. Right upper quadrant ultrasound did not reveal evidence of cholelithiasis or choledocholithiasis.  Chronic right upper quadrant pain:reproducible pain in right lateral/subcostal area Likely musculoskeletal in origin such as costochondritis EGD in the past was negative Right upper quadrant ultrasound showed fatty liver only HIDA scan normal LFTs are currently normal Very less likely pancreatic etiology such as pancreatic cancer given the chronicity of pain which patient has been experiencing for the last 15 years and nonprogressive other than intermittent episodes Had EGD a few years ago, it was negative per patient  Colon cancer screening: Negative Cologaurd test in 2018 Schedule colonoscopy   Follow up based on the colonoscopy results   [name], MD

## 2017-09-28 ENCOUNTER — Encounter: Payer: Self-pay | Admitting: Student

## 2017-09-29 ENCOUNTER — Ambulatory Visit: Payer: Medicare Other | Admitting: Anesthesiology

## 2017-09-29 ENCOUNTER — Encounter
Admission: RE | Disposition: A | Discharge status: Home / Self Care | Payer: Self-pay | Source: Ambulatory Visit | Attending: Gastroenterology

## 2017-09-29 ENCOUNTER — Ambulatory Visit
Admission: RE | Admit: 2017-09-29 | Discharge: 2017-09-29 | Disposition: A | Discharge status: Home / Self Care | Payer: Medicare Other | Source: Ambulatory Visit | Attending: Gastroenterology | Admitting: Gastroenterology

## 2017-09-29 DIAGNOSIS — R011 Cardiac murmur, unspecified: Secondary | ICD-10-CM | POA: Insufficient documentation

## 2017-09-29 DIAGNOSIS — Z79899 Other long term (current) drug therapy: Secondary | ICD-10-CM | POA: Insufficient documentation

## 2017-09-29 DIAGNOSIS — F419 Anxiety disorder, unspecified: Secondary | ICD-10-CM | POA: Diagnosis not present

## 2017-09-29 DIAGNOSIS — M1990 Unspecified osteoarthritis, unspecified site: Secondary | ICD-10-CM | POA: Insufficient documentation

## 2017-09-29 DIAGNOSIS — F17210 Nicotine dependence, cigarettes, uncomplicated: Secondary | ICD-10-CM | POA: Insufficient documentation

## 2017-09-29 DIAGNOSIS — Z1211 Encounter for screening for malignant neoplasm of colon: Secondary | ICD-10-CM | POA: Diagnosis not present

## 2017-09-29 DIAGNOSIS — Z886 Allergy status to analgesic agent status: Secondary | ICD-10-CM | POA: Insufficient documentation

## 2017-09-29 HISTORY — PX: COLONOSCOPY WITH PROPOFOL: SHX5780

## 2017-09-29 SURGERY — COLONOSCOPY WITH PROPOFOL
Anesthesia: General

## 2017-09-29 MED ORDER — SODIUM CHLORIDE 0.9 % IV SOLN
INTRAVENOUS | Status: DC
  Administered 2017-09-29: 11:00:00 via INTRAVENOUS
  Administered 2017-09-29: 1000 mL via INTRAVENOUS

## 2017-09-29 MED ORDER — PROPOFOL 500 MG/50ML IV EMUL
INTRAVENOUS | Status: DC | PRN
  Administered 2017-09-29: 60 ug/kg/min via INTRAVENOUS

## 2017-09-29 MED ORDER — PROPOFOL 500 MG/50ML IV EMUL
INTRAVENOUS | Status: AC
  Filled 2017-09-29: qty 50

## 2017-09-29 MED ORDER — PROPOFOL 10 MG/ML IV BOLUS
INTRAVENOUS | Status: DC | PRN
  Administered 2017-09-29: 50 mg via INTRAVENOUS
  Administered 2017-09-29: 30 mg via INTRAVENOUS

## 2017-09-29 NOTE — Transfer of Care (Signed)
Immediate Anesthesia Transfer of Care Note  Patient: Donna Rios  Procedure(s) Performed: COLONOSCOPY WITH PROPOFOL (N/A )  Patient Location: PACU  Anesthesia Type:General  Level of Consciousness: awake  Airway & Oxygen Therapy: Patient Spontanous Breathing  Post-op Assessment: Report given to RN  Post vital signs: stable  Last Vitals:  Vitals Value Taken Time  BP 123/68 09/29/2017 11:45 AM  Temp 35.9 C 09/29/2017 11:45 AM  Pulse 69 09/29/2017 11:45 AM  Resp 14 09/29/2017 11:45 AM  SpO2 99 % 09/29/2017 11:45 AM    Last Pain:  Vitals:   09/29/17 1145  TempSrc: Tympanic  PainSc: 0-No pain         Complications: No apparent anesthesia complications

## 2017-09-29 NOTE — Anesthesia Postprocedure Evaluation (Signed)
Anesthesia Post Note  Patient: Donna Rios  Procedure(s) Performed: COLONOSCOPY WITH PROPOFOL (N/A )  Patient location during evaluation: Endoscopy Anesthesia Type: General Level of consciousness: awake and alert Pain management: pain level controlled Vital Signs Assessment: post-procedure vital signs reviewed and stable Respiratory status: spontaneous breathing and respiratory function stable Cardiovascular status: stable Anesthetic complications: no     Last Vitals:  Vitals:   09/29/17 0956 09/29/17 1145  BP: (!) 144/83 123/68  Pulse: 78 69  Resp: 16 14  Temp: (!) 35.9 C (!) 35.9 C  SpO2: 97% 99%    Last Pain:  Vitals:   09/29/17 1145  TempSrc: Tympanic  PainSc: 0-No pain                 Carmella Kees K

## 2017-09-29 NOTE — Anesthesia Preprocedure Evaluation (Signed)
Anesthesia Evaluation  Patient identified by MRN, date of birth, ID band Patient awake    Reviewed: Allergy & Precautions, NPO status , Patient's Chart, lab work & pertinent test results  History of Anesthesia Complications Negative for: history of anesthetic complications  Airway Mallampati: II       Dental   Pulmonary neg sleep apnea, neg COPD, Current Smoker,           Cardiovascular (-) hypertension(-) Past MI and (-) CHF (-) dysrhythmias + Valvular Problems/Murmurs (murmur)      Neuro/Psych neg Seizures Anxiety    GI/Hepatic Neg liver ROS, neg GERD  ,  Endo/Other  neg diabetes  Renal/GU negative Renal ROS     Musculoskeletal   Abdominal   Peds  Hematology   Anesthesia Other Findings   Reproductive/Obstetrics                             Anesthesia Physical Anesthesia Plan  ASA: II  Anesthesia Plan: General   Post-op Pain Management:    Induction:   PONV Risk Score and Plan: 2 and Propofol infusion and TIVA  Airway Management Planned: Nasal Cannula  Additional Equipment:   Intra-op Plan:   Post-operative Plan:   Informed Consent: I have reviewed the patients History and Physical, chart, labs and discussed the procedure including the risks, benefits and alternatives for the proposed anesthesia with the patient or authorized representative who has indicated his/her understanding and acceptance.     Plan Discussed with:   Anesthesia Plan Comments:         Anesthesia Quick Evaluation

## 2017-09-29 NOTE — H&P (Signed)
[name], MD [address]  [place]  [place], [place] [address]  Main: [phone]  Fax: [phone] Pager: [phone]  Primary Care Physician:  [name], MD Primary Gastroenterologist:  Dr. [name]  Pre-Procedure History & Physical: HPI:  Donna Rios is a 67 y.o. female is here for an colonoscopy.   Past Medical History:  Diagnosis Date  . Allergic rhinitis   . Arthritis   . Heart murmur   . Phlebitis   . RUQ pain 06/21/2017   From the NM Hepato w Eject Fract order  . Stress incontinence     Past Surgical History:  Procedure Laterality Date  . BREAST BIOPSY Left    ducts removed  . Cataract surgery    . EYE SURGERY    . FOOT SURGERY     7  . KNEE ARTHROSCOPY Right   . LAPAROSCOPIC HYSTERECTOMY    . VARICOSE VEIN SURGERY      Prior to Admission medications   Medication Sig Start Date End Date Taking? Authorizing Provider  acetaminophen (TYLENOL) 650 MG CR tablet Take 650 mg by mouth.    [provider]  APPLE CIDER VINEGAR PO Take 1 tablet by mouth daily as needed.    [provider]  buPROPion (WELLBUTRIN SR) 150 MG 12 hr tablet Take 1 tablet (150 mg total) by mouth 2 (two) times daily. 09/05/17   [name], MD  cholecalciferol (VITAMIN D) 400 units TABS tablet Take 2,000 Units by mouth.    [provider]  doxycycline (VIBRA-TABS) 100 MG tablet Take 1 tablet (100 mg total) by mouth 2 (two) times daily. Patient not taking: Reported on 09/13/2017 08/10/17   [name], MD  fluticasone [facility]) 50 MCG/ACT nasal spray Place 1 spray into both nostrils daily.    [provider]  loratadine (CLARITIN) 10 MG tablet Take 10 mg by mouth daily.    [provider]  naphazoline-glycerin (CLEAR EYES) 0.012-0.2 % SOLN Apply to eye.    [provider]  Probiotic Product (PROBIOTIC ADVANCED PO) Take by mouth.    [provider]  rosuvastatin (CRESTOR) 20 MG tablet  TAKE 1 TABLET BY MOUTH EVERY DAY 07/04/17   [name], MD  sodium chloride (OCEAN) 0.65 % SOLN nasal spray Place 1 spray into both nostrils as needed for congestion.    [provider]    Allergies as of 09/14/2017 - Review Complete 09/13/2017  Allergen Reaction Noted  . Ibuprofen Other (See Comments) 09/17/2015  . Asa [aspirin] Other (See Comments) 06/11/2015    Family History  Problem Relation Age of Onset  . Alcoholism Unknown   . Arthritis Unknown   . Lung cancer Unknown   . Heart disease Unknown   . Stroke Unknown   . Hypertension Unknown   . Diabetes Unknown   . Heart failure Mother   . Hypertension Mother   . Diabetes Mother   . Heart failure Father   . Hypotension Father   . Breast cancer Neg Hx     Social History   Socioeconomic History  . Marital status: Married    Spouse name: Not on file  . Number of children: Not on file  . Years of education: Not on file  . Highest education level: Not on file  Occupational History  . Not on file  Social Needs  . Financial resource strain: Not hard at all  . Food insecurity:    Worry: Never  true    Inability: Never true  . Transportation needs:    Medical: No    Non-medical: No  Tobacco Use  . Smoking status: Current Every Day Smoker    Packs/day: 0.50    Years: 46.00    Pack years: 23.00    Types: Cigarettes  . Smokeless tobacco: Never Used  Substance and Sexual Activity  . Alcohol use: Yes    Alcohol/week: 0.0 standard drinks  . Drug use: No  . Sexual activity: Not on file  Lifestyle  . Physical activity:    Days per week: Not on file    Minutes per session: Not on file  . Stress: Not on file  Relationships  . Social connections:    Talks on phone: Not on file    Gets together: Not on file    Attends religious service: Not on file    Active member of club or organization: Not on file    Attends meetings of clubs or organizations: Not on file    Relationship status: Not on file  .  Intimate partner violence:    Fear of current or ex partner: No    Emotionally abused: No    Physically abused: No    Forced sexual activity: No  Other Topics Concern  . Not on file  Social History Narrative  . Not on file    Review of Systems: See HPI, otherwise negative ROS  Physical Exam: BP (!) 144/83   Pulse 78   Temp (!) 96.7 F (35.9 C) (Tympanic)   Resp 16   Ht 5' 8.5" (1.74 m)   Wt 93.1 kg   SpO2 97%   BMI 30.76 kg/m  General:   Alert,  pleasant and cooperative in NAD Head:  Normocephalic and atraumatic. Neck:  Supple; no masses or thyromegaly. Lungs:  Clear throughout to auscultation.    Heart:  Regular rate and rhythm. Abdomen:  Soft, nontender and nondistended. Normal bowel sounds, without guarding, and without rebound.   Neurologic:  Alert and  oriented x4;  grossly normal neurologically.  Impression/Plan: Donna Rios is here for an colonoscopy to be performed for colon cancer screening  Risks, benefits, limitations, and alternatives regarding  colonoscopy have been reviewed with the patient.  Questions have been answered.  All parties agreeable.   Sherri Sear, MD  09/29/2017, 10:00 AM

## 2017-09-29 NOTE — Anesthesia Post-op Follow-up Note (Signed)
Anesthesia QCDR form completed.        

## 2017-09-29 NOTE — Op Note (Signed)
[facility] Gastroenterology Patient Name: Donna Rios Procedure Date: 09/29/2017 11:15 AM MRN: [id number] Account #: [id number] Date of Birth: [birth year] Admit Type: Outpatient Age: 67 Room: [facility] ENDO ROOM 4 Gender: Female Note Status: Finalized Procedure:            Colonoscopy Indications:          Screening for colorectal malignant neoplasm, Last                        colonoscopy: February 2005 Providers:            Lin Landsman MD, MD Medicines:            Monitored Anesthesia Care Complications:        No immediate complications. Estimated blood loss: None. Procedure:            Pre-Anesthesia Assessment:                       - Prior to the procedure, a History and Physical was                        performed, and patient medications and allergies were                        reviewed. The patient is competent. The risks and                        benefits of the procedure and the sedation options and                        risks were discussed with the patient. All questions                        were answered and informed consent was obtained.                        Patient identification and proposed procedure were                        verified by the physician, the nurse, the                        anesthesiologist, the anesthetist and the technician in                        the pre-procedure area in the procedure room in the                        endoscopy suite. Mental Status Examination: alert and                        oriented. Airway Examination: normal oropharyngeal                        airway and neck mobility. Respiratory Examination:                        clear to auscultation. CV Examination: normal.  Prophylactic Antibiotics: The patient does not require                        prophylactic antibiotics. Prior Anticoagulants: The                        patient has taken no previous anticoagulant or                 antiplatelet agents. ASA Grade Assessment: II - A                        patient with mild systemic disease. After reviewing the                        risks and benefits, the patient was deemed in                        satisfactory condition to undergo the procedure. The                        anesthesia plan was to use monitored anesthesia care                        (MAC). Immediately prior to administration of                        medications, the patient was re-assessed for adequacy                        to receive sedatives. The heart rate, respiratory rate,                        oxygen saturations, blood pressure, adequacy of                        pulmonary ventilation, and response to care were                        monitored throughout the procedure. The physical status                        of the patient was re-assessed after the procedure.                       After obtaining informed consent, the colonoscope was                        passed under direct vision. Throughout the procedure,                        the patient's blood pressure, pulse, and oxygen                        saturations were monitored continuously. The                        Colonoscope was introduced through the anus and                        advanced to the the cecum, identified by appendiceal  orifice and ileocecal valve. The patient tolerated the                        procedure well. The quality of the bowel preparation                        was evaluated using the BBPS [facility] Bowel Preparation                        Scale) with scores of: Right Colon = 3, Transverse                        Colon = 3 and Left Colon = 3 (entire mucosa seen well                        with no residual staining, small fragments of stool or                        opaque liquid). The total BBPS score equals 9. The                        colonoscopy was technically difficult and  complex due                        to significant looping and the patient's body habitus.                        Successful completion of the procedure was aided by                        applying abdominal pressure. The patient tolerated the                        procedure fairly well. Findings:      The entire examined colon appeared normal.      The retroflexed view of the distal rectum and anal verge was normal and       showed no anal or rectal abnormalities.      The terminal ileum appeared normal. Impression:           - The entire examined colon is normal.                       - No specimens collected. Recommendation:       - Discharge patient to home (with escort).                       - Resume previous diet.                       - Continue present medications.                       - Repeat colonoscopy in 10 years for surveillance.                       - Do not recommend other screening tests for colon                        cancer surveillance in the  interim Procedure Code(s):    --- Professional ---                       S8110, Colorectal cancer screening; colonoscopy on                        individual not meeting criteria for high risk Diagnosis Code(s):    --- Professional ---                       Z12.11, Encounter for screening for malignant neoplasm                        of colon CPT copyright 2017 American Medical Association. All rights reserved. The codes documented in this report are preliminary and upon coder review may  be revised to meet current compliance requirements. Dr. Ulyess Mort Lin Landsman MD, MD 09/29/2017 11:44:59 AM This report has been signed electronically. Number of Addenda: 0 Note Initiated On: 09/29/2017 11:15 AM Scope Withdrawal Time: 0 hours 10 minutes 1 second  Total Procedure Duration: 0 hours 21 minutes 15 seconds       [facility]

## 2017-09-30 ENCOUNTER — Encounter: Payer: Self-pay | Admitting: Gastroenterology

## 2017-10-01 ENCOUNTER — Other Ambulatory Visit: Payer: Self-pay | Admitting: Family Medicine

## 2017-10-03 ENCOUNTER — Other Ambulatory Visit: Payer: Self-pay | Admitting: Family Medicine

## 2017-12-02 DIAGNOSIS — H524 Presbyopia: Secondary | ICD-10-CM | POA: Diagnosis not present

## 2017-12-14 ENCOUNTER — Ambulatory Visit: Payer: Medicare Other | Admitting: Gastroenterology

## 2017-12-14 ENCOUNTER — Encounter: Payer: Self-pay | Admitting: Gastroenterology

## 2017-12-14 VITALS — BP 154/85 | HR 92 | Resp 18 | Ht 68.0 in | Wt 211.8 lb

## 2017-12-14 DIAGNOSIS — G8929 Other chronic pain: Secondary | ICD-10-CM

## 2017-12-14 DIAGNOSIS — R1013 Epigastric pain: Secondary | ICD-10-CM | POA: Diagnosis not present

## 2017-12-14 DIAGNOSIS — R1011 Right upper quadrant pain: Secondary | ICD-10-CM | POA: Diagnosis not present

## 2017-12-14 NOTE — Progress Notes (Signed)
[name], MD [address]  [place]  [place], [place] [address]  Main: [phone]  Fax: [phone]    Gastroenterology Consultation  Referring Provider:     [name], MD Primary Care Physician:  [name], MD Primary Gastroenterologist:  Dr. [name] Reason for Consultation:     Fatty liver, chronic right upper quadrant pain        HPI:   Donna Rios is a 67 y.o. female referred by Dr. [name], [name], MD  for consultation & management of fatty liver. Patient had an episode of right upper quadrant pain lasted for about a week after a fatty meal in 04/2017. She saw Dr. [name] who checked LFTs which revealed elevated alkaline phosphatase and AST. She subsequently underwent ultrasound liver which revealed hepatic steatosis. Normal gallbladder and normal CBD. Repeat LFTs came back normal as of 06/07/2017. Apparently, patient reports that she has been dealing with several years history of mild right upper quadrant discomfort after eating fatty meals and radiates to right subcostal region. She has intentionally cut back on high carbs diet, red meat, fatty foods that resulted in some weight loss. She also reports mild epigastric discomfort. She denies nausea, vomiting, fever, chills. She stopped meloxicam about a month ago when she found out about elevated LFTs. HCV antibody negative. She reports having had an upper endoscopy and a colonoscopy about 15 years ago by Dr. [name] for evaluation of right upper quadrant pain as well as screening for colon cancer.These procedures were reportedly normal according to the patient She is a chronic smoker, started since age of 31 She is also recently found to have severe aortic stenosis and awaiting to see cardiologist.  Follow up visit 09/13/2017 She was seen by the cardiologist, Dr.Gollan who recommended observation/watchful waiting for next 6 months as patient is asymptomatic and otherwise healthy. She does  report mild, ongoing right upper quadrant/right lateral discomfort. She had HIDA scan which is unremarkable. She reports that eating fatty foods recently made the pain worse. Her symptoms are associated with mild bloating and gas. She takes probiotics daily which helps to keep her bowels regular. She lost about 10 pounds but gained 4 pounds back.  Follow-up visit 12/14/2017 She continues to gain weight.  Has intermittent right upper quadrant discomfort particularly after eating bigger meal.  She does have epigastric discomfort as well.   NSAIDs: none  Antiplts/Anticoagulants/Anti thrombotics: none  GI Procedures: EGD and colonoscopy by Dr. [name], reportedly normal Colo-guard test negative in 2018 Colonoscopy 09/29/2017 Normal  Past Medical History:  Diagnosis Date  . Allergic rhinitis   . Arthritis   . Heart murmur   . Phlebitis   . RUQ pain 06/21/2017   From the NM Hepato w Eject Fract order  . Stress incontinence     Past Surgical History:  Procedure Laterality Date  . BREAST BIOPSY Left    ducts removed  . Cataract surgery    . COLONOSCOPY WITH PROPOFOL N/A 09/29/2017   Procedure: COLONOSCOPY WITH PROPOFOL;  Surgeon: Lin Landsman, MD;  Location: [facility] ENDOSCOPY;  Service: Gastroenterology;  Laterality: N/A;  . EYE SURGERY    . FOOT SURGERY     7  . KNEE ARTHROSCOPY Right   . LAPAROSCOPIC HYSTERECTOMY    . VARICOSE VEIN SURGERY      Current Outpatient Medications:  .  acetaminophen (TYLENOL) 650 MG CR tablet, Take 650 mg by mouth., Disp: , Rfl:  .  APPLE CIDER  VINEGAR PO, Take 1 tablet by mouth daily as needed., Disp: , Rfl:  .  cholecalciferol (VITAMIN D) 400 units TABS tablet, Take 2,000 Units by mouth., Disp: , Rfl:  .  Cyanocobalamin (B-12) 100 MCG TABS, Take by mouth., Disp: , Rfl:  .  fluticasone (FLONASE) 50 MCG/ACT nasal spray, Place 1 spray into both nostrils daily., Disp: , Rfl:  .  loratadine (CLARITIN) 10 MG tablet, Take 10 mg by mouth daily., Disp: ,  Rfl:  .  naphazoline-glycerin (CLEAR EYES) 0.012-0.2 % SOLN, Apply to eye., Disp: , Rfl:  .  Probiotic Product (PROBIOTIC ADVANCED PO), Take by mouth., Disp: , Rfl:  .  rosuvastatin (CRESTOR) 20 MG tablet, TAKE 1 TABLET BY MOUTH EVERY DAY, Disp: 90 tablet, Rfl: 1 .  sodium chloride (OCEAN) 0.65 % SOLN nasal spray, Place 1 spray into both nostrils as needed for congestion., Disp: , Rfl:  .  buPROPion (WELLBUTRIN SR) 150 MG 12 hr tablet, TAKE 1 TABLET BY MOUTH TWICE A DAY (Patient not taking: Reported on 12/14/2017), Disp: 180 tablet, Rfl: 1   Family History  Problem Relation Age of Onset  . Alcoholism Unknown   . Arthritis Unknown   . Lung cancer Unknown   . Heart disease Unknown   . Stroke Unknown   . Hypertension Unknown   . Diabetes Unknown   . Heart failure Mother   . Hypertension Mother   . Diabetes Mother   . Heart failure Father   . Hypotension Father   . Breast cancer Neg Hx      Social History   Tobacco Use  . Smoking status: Current Every Day Smoker    Packs/day: 0.50    Years: 46.00    Pack years: 23.00    Types: Cigarettes  . Smokeless tobacco: Never Used  Substance Use Topics  . Alcohol use: Yes    Alcohol/week: 0.0 standard drinks  . Drug use: No    Allergies as of 12/14/2017 - Review Complete 12/14/2017  Allergen Reaction Noted  . Ibuprofen Other (See Comments) 09/17/2015  . Asa [aspirin] Other (See Comments) 06/11/2015    Review of Systems:    All systems reviewed and negative except where noted in HPI.   Physical Exam:  BP (!) 154/85 (BP Location: Left Arm, Patient Position: Sitting, Cuff Size: Normal)   Pulse 92   Resp 18   Ht 5\' 8"  (1.727 m)   Wt 211 lb 12.8 oz (96.1 kg)   BMI 32.20 kg/m  No LMP recorded. Patient has had a hysterectomy.  General:   Alert,  Well-developed, well-nourished, pleasant and cooperative in NAD Head:  Normocephalic and atraumatic. Eyes:  Sclera clear, no icterus.   Conjunctiva pink. Ears:  Normal auditory  acuity. Nose:  No deformity, discharge, or lesions. Mouth:  No deformity or lesions,oropharynx pink & moist. Neck:  Supple; no masses or thyromegaly. Lungs:  Respirations even and unlabored.  Clear throughout to auscultation.   No wheezes, crackles, or rhonchi. No acute distress. Heart:  Regular rate and rhythm; systolic murmurs, clicks, rubs, or gallops. Abdomen:  Normal bowel sounds. Soft, tenderness in the epigastric area and non-distended without masses, hepatosplenomegaly or hernias noted.  No guarding or rebound tenderness.   Rectal: Not performed Msk:  Symmetrical without gross deformities. Good, equal movement & strength bilaterally. Pulses:  Normal pulses noted. Extremities:  No clubbing or edema.  No cyanosis. Neurologic:  Alert and oriented x3;  grossly normal neurologically. Skin:  Intact without significant lesions or rashes.  No jaundice. Psych:  Alert and cooperative. Normal mood and affect.  Imaging Studies: Abdominal imaging reviewed  Assessment and Plan:   Donna Rios is a 67 y.o. female with history of obesity, hyperlipidemia, chronic tobacco use, severe aortic stenosis, with intermittent right upper quadrant discomfort precipitated with fatty meals, epigastric pain, fatty liver with transiently elevated LFTs after a fatty meal. Right upper quadrant ultrasound did not reveal evidence of cholelithiasis or choledocholithiasis.  Chronic right upper quadrant pain:reproducible pain in right lateral/subcostal area Likely musculoskeletal in origin such as costochondritis EGD in the past was negative Right upper quadrant ultrasound showed fatty liver only HIDA scan normal Recheck LFTs Had EGD a few years ago, it was negative per patient  Epigastric pain H. pylori breath test negative Less likely pancreatic etiology, however given her age and chronicity of pain, recommend CT pancreas protocol  Colon cancer screening: Negative Cologaurd test in 2018 Colonoscopy 09/2017  normal Recommend surveillance in 10years   Follow up after the above tests if abnormal   [name], MD

## 2017-12-15 LAB — HEPATIC FUNCTION PANEL
ALBUMIN: 4.4 g/dL (ref 3.6–4.8)
ALT: 13 IU/L (ref 0–32)
AST: 16 IU/L (ref 0–40)
Alkaline Phosphatase: 103 IU/L (ref 39–117)
BILIRUBIN TOTAL: 0.3 mg/dL (ref 0.0–1.2)
BILIRUBIN, DIRECT: 0.09 mg/dL (ref 0.00–0.40)
Total Protein: 6.9 g/dL (ref 6.0–8.5)

## 2017-12-20 DIAGNOSIS — H26492 Other secondary cataract, left eye: Secondary | ICD-10-CM | POA: Diagnosis not present

## 2017-12-20 DIAGNOSIS — Z961 Presence of intraocular lens: Secondary | ICD-10-CM | POA: Diagnosis not present

## 2018-01-02 ENCOUNTER — Ambulatory Visit (INDEPENDENT_AMBULATORY_CARE_PROVIDER_SITE_OTHER): Payer: Medicare Other | Admitting: Family Medicine

## 2018-01-02 ENCOUNTER — Encounter: Payer: Self-pay | Admitting: Family Medicine

## 2018-01-02 VITALS — BP 172/92 | HR 95 | Temp 98.6°F | Resp 18 | Wt 216.2 lb

## 2018-01-02 DIAGNOSIS — J989 Respiratory disorder, unspecified: Secondary | ICD-10-CM | POA: Diagnosis not present

## 2018-01-02 DIAGNOSIS — R350 Frequency of micturition: Secondary | ICD-10-CM

## 2018-01-02 DIAGNOSIS — R300 Dysuria: Secondary | ICD-10-CM | POA: Diagnosis not present

## 2018-01-02 DIAGNOSIS — R030 Elevated blood-pressure reading, without diagnosis of hypertension: Secondary | ICD-10-CM

## 2018-01-02 LAB — POCT URINALYSIS DIPSTICK
Bilirubin, UA: NEGATIVE
Glucose, UA: NEGATIVE
KETONES UA: NEGATIVE
Leukocytes, UA: NEGATIVE
NITRITE UA: NEGATIVE
PH UA: 6 (ref 5.0–8.0)
PROTEIN UA: NEGATIVE
Spec Grav, UA: 1.01 (ref 1.010–1.025)
Urobilinogen, UA: 0.2 E.U./dL

## 2018-01-02 LAB — BASIC METABOLIC PANEL
BUN: 13 mg/dL (ref 6–23)
CALCIUM: 9.8 mg/dL (ref 8.4–10.5)
CO2: 29 mEq/L (ref 19–32)
Chloride: 105 mEq/L (ref 96–112)
Creatinine, Ser: 0.75 mg/dL (ref 0.40–1.20)
GFR: 81.83 mL/min (ref >60.00)
GLUCOSE: 103 mg/dL — AB (ref 70–99)
POTASSIUM: 4.5 meq/L (ref 3.5–5.1)
Sodium: 141 mEq/L (ref 135–145)

## 2018-01-02 LAB — URINALYSIS, MICROSCOPIC ONLY: WBC UA: NONE SEEN (ref 0–?)

## 2018-01-02 MED ORDER — AMOXICILLIN-POT CLAVULANATE 875-125 MG PO TABS: 1.0000 | ORAL_TABLET | Freq: Two times a day (BID) | ORAL | 0 refills | Status: DC

## 2018-01-02 NOTE — Assessment & Plan Note (Signed)
Symptoms concerning for UTI.  Dipstick with blood.  Will send for culture and microscopy.  We will provide empiric coverage with Augmentin once her renal function returns.

## 2018-01-02 NOTE — Assessment & Plan Note (Signed)
Symptoms could be viral respiratory illness, sinusitis, or allergy related.  We are going to treat her for a UTI empirically and will cover with Augmentin as that will provide treatment for sinusitis.  If not improving she will let us know.  Discussed discontinuing over-the-counter sinus medication.

## 2018-01-02 NOTE — Progress Notes (Signed)
[name], MD Phone: [phone]  Donna Rios is a 67 y.o. female who presents today for same day visit.   CC: Urinary frequency, upper respiratory infection, elevated blood pressure  Urinary frequency: Patient notes for the last week or so she is had symptoms of frequency and urinary urgency.  She notes an odor to her urine.  She denies dysuria.  She notes some mild low back discomfort and suprapubic discomfort that is described as a pressure that relieves after urination.  She notes no vaginal discharge.  No fevers.  Upper respiratory infection: Patient notes for a week or so she has had sneezing with cough and postnasal drip and frontal sinus congestion.  She is been blowing yellow/green/clear mucus out of her nose.  No fevers.  She is tried over-the-counter medications and taking her allergy medicine.  She notes her husband had similar symptoms previously.  Elevated blood pressure: She has not been checking this at home.  She denies chest pain or shortness of breath.  She notes she has been taking an over-the-counter sinus medication.  Social History   Tobacco Use  Smoking Status Current Every Day Smoker  . Packs/day: 0.50  . Years: 46.00  . Pack years: 23.00  . Types: Cigarettes  Smokeless Tobacco Never Used     ROS see history of present illness  Objective  Physical Exam Vitals:   01/02/18 1128 01/02/18 1235  BP: (!) 170/98 (!) 172/92  Pulse: 95   Resp: 18   Temp: 98.6 F (37 C)   SpO2: 97%     BP Readings from Last 3 Encounters:  01/02/18 (!) 172/92  12/14/17 (!) 154/85  09/29/17 126/71   Wt Readings from Last 3 Encounters:  01/02/18 216 lb 4 oz (98.1 kg)  12/14/17 211 lb 12.8 oz (96.1 kg)  09/29/17 205 lb 5 oz (93.1 kg)    Physical Exam  Constitutional: No distress.  HENT:  Head: Normocephalic and atraumatic.  Mouth/Throat: Oropharynx is clear and moist.  Eyes: Pupils are equal, round, and reactive to light. Conjunctivae are normal.  Neck:  Neck supple.  Cardiovascular: Normal rate and regular rhythm.  Murmur (2/6 SEM) heard. Pulmonary/Chest: Effort normal and breath sounds normal.  Abdominal: Soft. Bowel sounds are normal. She exhibits no distension. There is no tenderness.  Musculoskeletal: She exhibits no edema.  Lymphadenopathy:    She has no cervical adenopathy.  Neurological: She is alert.  Skin: Skin is warm and dry. She is not diaphoretic.     Assessment/Plan: Please see individual problem list.  Respiratory illness Symptoms could be viral respiratory illness, sinusitis, or allergy related.  We are going to treat her for a UTI empirically and will cover with Augmentin as that will provide treatment for sinusitis.  If not improving she will let us know.  Discussed discontinuing over-the-counter sinus medication.  Urine frequency Symptoms concerning for UTI.  Dipstick with blood.  Will send for culture and microscopy.  We will provide empiric coverage with Augmentin once her renal function returns.  Elevated BP without diagnosis of hypertension Elevated today in the office.  Had been normal previously.  I suspect this is related to possible decongestant use as part of her over-the-counter sinus medication.  She has been asked to discontinue that.  We will check renal function.  She will return in 1 week for repeat blood pressure check.  She will start checking at home as well.  Given return precautions.   Orders Placed This Encounter  Procedures  .  Urine Culture  . Urine Microscopic Only  . Basic Metabolic Panel (BMET)  . POCT Urinalysis Dipstick    Meds ordered this encounter  Medications  . amoxicillin-clavulanate (AUGMENTIN) 875-125 MG tablet    Sig: Take 1 tablet by mouth 2 (two) times daily.    Dispense:  14 tablet    Refill:  0     [name], MD [place]

## 2018-01-02 NOTE — Assessment & Plan Note (Addendum)
Elevated today in the office.  Had been normal previously.  I suspect this is related to possible decongestant use as part of her over-the-counter sinus medication.  She has been asked to discontinue that.  We will check renal function.  She will return in 1 week for repeat blood pressure check.  She will start checking at home as well.  Given return precautions.

## 2018-01-02 NOTE — Patient Instructions (Signed)
Nice to see you. We will get lab work today and contact you with the results. We will place you on an antibiotic once we get your lab results back. If you develop worsening symptoms please be reevaluated.

## 2018-01-03 ENCOUNTER — Ambulatory Visit
Admission: RE | Admit: 2018-01-03 | Discharge: 2018-01-03 | Disposition: A | Discharge status: Home / Self Care | Payer: Medicare Other | Source: Ambulatory Visit | Attending: Gastroenterology | Admitting: Gastroenterology

## 2018-01-03 DIAGNOSIS — R1011 Right upper quadrant pain: Secondary | ICD-10-CM | POA: Diagnosis not present

## 2018-01-03 DIAGNOSIS — R1013 Epigastric pain: Secondary | ICD-10-CM | POA: Diagnosis not present

## 2018-01-03 DIAGNOSIS — G8929 Other chronic pain: Secondary | ICD-10-CM | POA: Insufficient documentation

## 2018-01-03 HISTORY — DX: Essential (primary) hypertension: I10

## 2018-01-03 LAB — URINE CULTURE
MICRO NUMBER:: 91470755
SPECIMEN QUALITY:: ADEQUATE

## 2018-01-03 LAB — POCT I-STAT CREATININE: Creatinine, Ser: 0.7 mg/dL (ref 0.44–1.00)

## 2018-01-03 MED ORDER — IOPAMIDOL (ISOVUE-300) INJECTION 61%
100.0000 mL | Freq: Once | INTRAVENOUS | Status: AC | PRN
  Administered 2018-01-03: 100 mL via INTRAVENOUS

## 2018-01-11 ENCOUNTER — Ambulatory Visit (INDEPENDENT_AMBULATORY_CARE_PROVIDER_SITE_OTHER): Payer: Medicare Other | Admitting: *Deleted

## 2018-01-11 VITALS — BP 142/88 | HR 83 | Resp 18

## 2018-01-11 DIAGNOSIS — R030 Elevated blood-pressure reading, without diagnosis of hypertension: Secondary | ICD-10-CM | POA: Diagnosis not present

## 2018-01-12 ENCOUNTER — Encounter: Payer: Self-pay | Admitting: *Deleted

## 2018-01-12 NOTE — Progress Notes (Signed)
Patient here for nurse visit BP check per order from 01/02/18.   Patient reports compliance with prescribed BP medications: yes,no medication prescribed  Last dose of BP medication:   BP Readings from Last 3 Encounters:  01/11/18 (!) 138/92  01/02/18 (!) 172/92  12/14/17 (!) 154/85   Pulse Readings from Last 3 Encounters:  01/11/18 85  01/02/18 95  12/14/17 92    Placed list of home reading on desk.  Patient verbalized understanding of instructions.    Kerin Salen, LPN

## 2018-01-26 NOTE — Progress Notes (Signed)
Home blood pressures do reveal some elevated blood pressures.  I would like for the patient to start on amlodipine 5 mg daily.  If she is willing please send this to her pharmacy.  She would need a blood pressure check in 1 month.  She should continue to monitor her blood pressure.

## 2018-02-03 NOTE — Progress Notes (Signed)
Patient refused BP medication til she can see PCP at [place] 01/27/18, patient has had some lower readings and has had a fall. Patient refused UC .

## 2018-02-06 ENCOUNTER — Ambulatory Visit (INDEPENDENT_AMBULATORY_CARE_PROVIDER_SITE_OTHER): Payer: Medicare Other | Admitting: Family Medicine

## 2018-02-06 ENCOUNTER — Encounter: Payer: Self-pay | Admitting: Family Medicine

## 2018-02-06 ENCOUNTER — Ambulatory Visit
Admission: RE | Admit: 2018-02-06 | Discharge: 2018-02-06 | Disposition: A | Discharge status: Home / Self Care | Payer: Medicare Other | Source: Ambulatory Visit | Attending: Family Medicine | Admitting: Family Medicine

## 2018-02-06 ENCOUNTER — Telehealth: Payer: Self-pay | Admitting: Family Medicine

## 2018-02-06 ENCOUNTER — Ambulatory Visit (INDEPENDENT_AMBULATORY_CARE_PROVIDER_SITE_OTHER): Payer: Medicare Other

## 2018-02-06 VITALS — BP 150/98 | HR 79 | Temp 98.4°F | Ht 68.0 in | Wt 214.0 lb

## 2018-02-06 DIAGNOSIS — S0990XA Unspecified injury of head, initial encounter: Secondary | ICD-10-CM

## 2018-02-06 DIAGNOSIS — G44301 Post-traumatic headache, unspecified, intractable: Secondary | ICD-10-CM

## 2018-02-06 DIAGNOSIS — R030 Elevated blood-pressure reading, without diagnosis of hypertension: Secondary | ICD-10-CM | POA: Diagnosis not present

## 2018-02-06 DIAGNOSIS — R51 Headache: Secondary | ICD-10-CM | POA: Diagnosis not present

## 2018-02-06 DIAGNOSIS — H8111 Benign paroxysmal vertigo, right ear: Secondary | ICD-10-CM

## 2018-02-06 DIAGNOSIS — M542 Cervicalgia: Secondary | ICD-10-CM | POA: Diagnosis not present

## 2018-02-06 DIAGNOSIS — H8110 Benign paroxysmal vertigo, unspecified ear: Secondary | ICD-10-CM

## 2018-02-06 DIAGNOSIS — M50323 Other cervical disc degeneration at C6-C7 level: Secondary | ICD-10-CM | POA: Diagnosis not present

## 2018-02-06 NOTE — Telephone Encounter (Signed)
Donna Rios from [place] with CT results:  IMPRESSION: No acute intracranial abnormalities. Results are in Epic and routing result to PCP office.

## 2018-02-06 NOTE — Patient Instructions (Signed)
Nice to see you. We will get a CT scan of your head. We will get an x-ray of your neck.  If you develop worsening headaches, numbness, weakness, vision changes, persistent vertigo, or any new or changing symptoms please seek medical attention.

## 2018-02-07 ENCOUNTER — Telehealth: Payer: Self-pay | Admitting: Family Medicine

## 2018-02-07 NOTE — Telephone Encounter (Signed)
Copied from [place] [id number]. Topic: Quick Communication - See Telephone Encounter >> Feb 07, 2018  9:50 AM Robina Ade, Helene Kelp D wrote: CRM for notification. See Telephone encounter for: 02/07/18. Patient called wanting her x-ray results from yesterday. Please call pt back, thanks.

## 2018-02-07 NOTE — Telephone Encounter (Signed)
Called and spoke with patient. Pt advised. Pt stated that she is willing to start BP medication if need and what should she does about the vertigo symptoms? Start medication or start PT?  Pt would like a call back today not knowing worries her pt stated  Sent to PCP

## 2018-02-07 NOTE — Telephone Encounter (Signed)
Please see other phone note

## 2018-02-07 NOTE — Telephone Encounter (Signed)
Please call patient on Cell phone when X-ray and CT are resulted by PCP.

## 2018-02-07 NOTE — Telephone Encounter (Signed)
Sent to PCP ?

## 2018-02-07 NOTE — Telephone Encounter (Signed)
Please let the patient know that her x-ray of her neck revealed degenerative disc disease though no apparent acute changes.  Her CT of her head revealed no acute changes.  There is some atherosclerosis in her carotid arteries.  Treatment for this includes controlling her cholesterol and blood pressure.  Thanks.

## 2018-02-07 NOTE — Telephone Encounter (Signed)
I placed a referral for vestibular rehab.  Somebody should be contacting her.  Her BP at home when I saw her yesterday had been well controlled.  We discussed holding off on blood pressure medication.  She should monitor her blood pressure and if it starts to trend higher than 140/90 consistently she should let us know.

## 2018-02-07 NOTE — Telephone Encounter (Signed)
Sent to PCP as an Juluis Rainier will call patient's cell once reviewed by you.

## 2018-02-07 NOTE — Addendum Note (Signed)
Addended by: [name] on: 02/07/2018 03:34 PM   Modules accepted: Orders

## 2018-02-08 DIAGNOSIS — S0990XA Unspecified injury of head, initial encounter: Secondary | ICD-10-CM | POA: Insufficient documentation

## 2018-02-08 DIAGNOSIS — H8110 Benign paroxysmal vertigo, unspecified ear: Secondary | ICD-10-CM | POA: Insufficient documentation

## 2018-02-08 DIAGNOSIS — M542 Cervicalgia: Secondary | ICD-10-CM | POA: Insufficient documentation

## 2018-02-08 NOTE — Progress Notes (Signed)
[name], MD Phone: [phone]  Donna Rios is a 68 y.o. female who presents today for follow-up.  CC: Hypertension, fall, vertigo  Her blood pressures at home are typically running 103-138/60s-70s.  Better control at home than in the office.  No chest pain or shortness of breath.  She has chronic edema that is unchanged.  No orthopnea or PND.  Patient reports she fell about a month ago.  She stepped wrong coming down landing and hit the right side of her head on a beam.  She had no loss of consciousness.  She noted no bruising.  She did note tenderness in the area.  She does note since then she has had some headaches.  She reports 1 to 2 weeks after hitting her head she started to have vertigo.  This is described as a spinning sensation when she shifts her head or leans her head back.  Particularly bothers her if she flips over in bed onto the right side.  She has chronic left-sided tinnitus.  She notes chronic numbness in her left hand that is unchanged.  No weakness or vision changes.  She does note her neck has been bothering her posteriorly on the left side prior to hitting her head though possibly worse since hitting her head.  Notes it hurts with rotational movements.  She also notes her left shoulder anteriorly and left upper chest bothers her if she moves wrong.  Social History   Tobacco Use  Smoking Status Current Every Day Smoker  . Packs/day: 0.50  . Years: 46.00  . Pack years: 23.00  . Types: Cigarettes  Smokeless Tobacco Never Used     ROS see history of present illness  Objective  Physical Exam Vitals:   02/06/18 1339  BP: (!) 150/98  Pulse: 79  Temp: 98.4 F (36.9 C)  SpO2: 98%    BP Readings from Last 3 Encounters:  02/06/18 (!) 150/98  01/11/18 (!) 142/88  01/02/18 (!) 172/92   Wt Readings from Last 3 Encounters:  02/06/18 214 lb (97.1 kg)  01/02/18 216 lb 4 oz (98.1 kg)  12/14/17 211 lb 12.8 oz (96.1 kg)    Physical  Exam Constitutional:      General: She is not in acute distress.    Appearance: She is not diaphoretic.  HENT:     Head: Normocephalic and atraumatic.     Comments: Negative battle sign, no raccoon eyes    Right Ear: Tympanic membrane normal. No hemotympanum.     Left Ear: Tympanic membrane normal. No hemotympanum.  Cardiovascular:     Rate and Rhythm: Normal rate and regular rhythm.     Heart sounds: Murmur (3/6 right upper sternal border systolic ejection murmur) present.  Pulmonary:     Effort: Pulmonary effort is normal.     Breath sounds: Normal breath sounds.  Chest:     Chest wall: Tenderness (Left upper chest near her left shoulder with tenderness, chaperone used) present.  Musculoskeletal:     Comments: No midline neck tenderness, no midline neck step-off, there is left-sided proximal trapezius tenderness  Skin:    General: Skin is warm and dry.  Neurological:     Mental Status: She is alert.     Comments: CN 2-12 intact, 5/5 strength in bilateral biceps, triceps, grip, quads, hamstrings, plantar and dorsiflexion, sensation to light touch intact in bilateral UE and LE, normal gait, 2+ patellar reflexes, positive Dix-Hallpike on the right, resolved quickly, patient deferred completing left-sided Dix-Hallpike  Assessment/Plan: Please see individual problem list.  Elevated BP without diagnosis of hypertension Adequately controlled at home.  She will continue current medications.  Head injury Related to fall with striking her head on a beam.  No loss of consciousness.  She has had intermittent headaches.  I believe the vertigo symptoms are unrelated though given persistent intermittent headaches and vertigo we will obtain a stat CT scan of her head to rule out intracranial pathology.  No external signs of pathology.  Neck pain This has been a somewhat chronic issue though slightly worse since hitting her head.  I suspect muscular strain.  X-ray cervical spine to be  completed.  BPPV (benign paroxysmal positional vertigo) Symptoms and exam most consistent with BPPV.  Once her CT head returns we will consider vestibular rehab.  Suspect muscular strain of left upper chest.  Discussed possible left shoulder x-ray though this is deferred.  Symptoms could represent rotator cuff impingement as well.  She will monitor.  Orders Placed This Encounter  Procedures  . CT Head Wo Contrast    Standing Status:   Future    Number of Occurrences:   1    Standing Expiration Date:   05/08/2019    Order Specific Question:   ** REASON FOR EXAM (FREE TEXT)    Answer:   Head injury 1 month ago, persistent headache and vertigo mildly worsening,    Order Specific Question:   Preferred imaging location?    Answer:   [facility]    Order Specific Question:   Call Results- Best Contact Number?    Answer:   BEFORE 5pm  [phone] .Marland KitchenMarland KitchenMarland KitchenAFTER 5pm   [phone]..........HOLD PATIENT    Order Specific Question:   Radiology Contrast Protocol - do NOT remove file path    Answer:   \\[email or web address].pdf  . DG Cervical Spine Complete    Standing Status:   Future    Number of Occurrences:   1    Standing Expiration Date:   04/07/2019    Order Specific Question:   Reason for Exam (SYMPTOM  OR DIAGNOSIS REQUIRED)    Answer:   neck pain, left sided, chronic    Order Specific Question:   Preferred imaging location?    Answer:   [facility] Specific Question:   Radiology Contrast Protocol - do NOT remove file path    Answer:   \\[email or web address]\[email or web address]\DXFluoroContrastProtocols.pdf    No orders of the defined types were placed in this encounter.    [name], MD [place]

## 2018-02-08 NOTE — Assessment & Plan Note (Signed)
Related to fall with striking her head on a beam.  No loss of consciousness.  She has had intermittent headaches.  I believe the vertigo symptoms are unrelated though given persistent intermittent headaches and vertigo we will obtain a stat CT scan of her head to rule out intracranial pathology.  No external signs of pathology.

## 2018-02-08 NOTE — Assessment & Plan Note (Addendum)
This has been a somewhat chronic issue though slightly worse since hitting her head.  I suspect muscular strain.  X-ray cervical spine to be completed.

## 2018-02-08 NOTE — Assessment & Plan Note (Signed)
Adequately controlled at home.  She will continue current medications.

## 2018-02-08 NOTE — Assessment & Plan Note (Signed)
Symptoms and exam most consistent with BPPV.  Once her CT head returns we will consider vestibular rehab.

## 2018-02-09 NOTE — Telephone Encounter (Signed)
Called and spoke with patient. Pt advised and voiced understanding.  

## 2018-02-10 ENCOUNTER — Encounter: Payer: Self-pay | Admitting: Family Medicine

## 2018-02-10 ENCOUNTER — Ambulatory Visit (INDEPENDENT_AMBULATORY_CARE_PROVIDER_SITE_OTHER): Payer: Medicare Other | Admitting: Family Medicine

## 2018-02-10 VITALS — BP 126/80 | HR 83 | Temp 98.0°F | Resp 17 | Ht 68.0 in | Wt 214.5 lb

## 2018-02-10 DIAGNOSIS — H8110 Benign paroxysmal vertigo, unspecified ear: Secondary | ICD-10-CM | POA: Diagnosis not present

## 2018-02-10 DIAGNOSIS — R51 Headache: Secondary | ICD-10-CM

## 2018-02-10 DIAGNOSIS — M79672 Pain in left foot: Secondary | ICD-10-CM

## 2018-02-10 DIAGNOSIS — E782 Mixed hyperlipidemia: Secondary | ICD-10-CM

## 2018-02-10 DIAGNOSIS — R519 Headache, unspecified: Secondary | ICD-10-CM

## 2018-02-10 DIAGNOSIS — R011 Cardiac murmur, unspecified: Secondary | ICD-10-CM | POA: Diagnosis not present

## 2018-02-10 DIAGNOSIS — M7542 Impingement syndrome of left shoulder: Secondary | ICD-10-CM | POA: Diagnosis not present

## 2018-02-10 DIAGNOSIS — M79671 Pain in right foot: Secondary | ICD-10-CM | POA: Insufficient documentation

## 2018-02-10 DIAGNOSIS — R030 Elevated blood-pressure reading, without diagnosis of hypertension: Secondary | ICD-10-CM

## 2018-02-10 LAB — LIPID PANEL
CHOL/HDL RATIO: 4
CHOLESTEROL: 191 mg/dL (ref 0–200)
HDL: 52.3 mg/dL (ref >39.00)
NonHDL: 138.35
TRIGLYCERIDES: 286 mg/dL — AB (ref 0.0–149.0)
VLDL: 57.2 mg/dL — AB (ref 0.0–40.0)

## 2018-02-10 LAB — COMPREHENSIVE METABOLIC PANEL
ALBUMIN: 4.2 g/dL (ref 3.5–5.2)
ALT: 14 U/L (ref 0–35)
AST: 16 U/L (ref 0–37)
Alkaline Phosphatase: 83 U/L (ref 39–117)
BUN: 10 mg/dL (ref 6–23)
CALCIUM: 9.9 mg/dL (ref 8.4–10.5)
CHLORIDE: 102 meq/L (ref 96–112)
CO2: 30 mEq/L (ref 19–32)
Creatinine, Ser: 0.77 mg/dL (ref 0.40–1.20)
GFR: 74.67 mL/min (ref >60.00)
Glucose, Bld: 106 mg/dL — ABNORMAL HIGH (ref 70–99)
POTASSIUM: 4.5 meq/L (ref 3.5–5.1)
SODIUM: 140 meq/L (ref 135–145)
Total Bilirubin: 0.4 mg/dL (ref 0.2–1.2)
Total Protein: 6.8 g/dL (ref 6.0–8.3)

## 2018-02-10 LAB — LDL CHOLESTEROL, DIRECT: Direct LDL: 110 mg/dL

## 2018-02-10 NOTE — Patient Instructions (Addendum)
Nice to see you. We will check lab work today and contact you with the results. Please do the Epley maneuvers for your vertigo. Please do the exercises for your left shoulder. If your shoulder does not improve please let us know.  If your vertigo does not improve please do the physical therapy that has already been ordered.   Shoulder Impingement Syndrome Rehab Ask your health care provider which exercises are safe for you. Do exercises exactly as told by your health care provider and adjust them as directed. It is normal to feel mild stretching, pulling, tightness, or discomfort as you do these exercises, but you should stop right away if you feel sudden pain or your pain gets worse.Do not begin these exercises until told by your health care provider. Stretching and range of motion exercise This exercise warms up your muscles and joints and improves the movement and flexibility of your shoulder. This exercise also helps to relieve pain and stiffness. Exercise A: Passive horizontal adduction  1. Sit or stand and pull your left / right elbow across your chest, toward your other shoulder. Stop when you feel a gentle stretch in the back of your shoulder and upper arm. ? Keep your arm at shoulder height. ? Keep your arm as close to your body as you comfortably can. 2. Hold for __________ seconds. 3. Slowly return to the starting position. Repeat __________ times. Complete this exercise __________ times a day. Strengthening exercises These exercises build strength and endurance in your shoulder. Endurance is the ability to use your muscles for a long time, even after they get tired. Exercise B: External rotation, isometric 1. Stand or sit in a doorway, facing the door frame. 2. Bend your left / right elbow and place the back of your wrist against the door frame. Only your wrist should be touching the frame. Keep your upper arm at your side. 3. Gently press your wrist against the door frame, as if  you are trying to push your arm away from your abdomen. ? Avoid shrugging your shoulder while you press your hand against the door frame. Keep your shoulder blade tucked down toward the middle of your back. 4. Hold for __________ seconds. 5. Slowly release the tension, and relax your muscles completely before you do the exercise again. Repeat __________ times. Complete this exercise __________ times a day. Exercise C: Internal rotation, isometric  1. Stand or sit in a doorway, facing the door frame. 2. Bend your left / right elbow and place the inside of your wrist against the door frame. Only your wrist should be touching the frame. Keep your upper arm at your side. 3. Gently press your wrist against the door frame, as if you are trying to push your arm toward your abdomen. ? Avoid shrugging your shoulder while you press your hand against the door frame. Keep your shoulder blade tucked down toward the middle of your back. 4. Hold for __________ seconds. 5. Slowly release the tension, and relax your muscles completely before you do the exercise again. Repeat __________ times. Complete this exercise __________ times a day. Exercise D: Scapular protraction, supine  1. Lie on your back on a firm surface. Hold a __________ weight in your left / right hand. 2. Raise your left / right arm straight into the air so your hand is directly above your shoulder joint. 3. Push the weight into the air so your shoulder lifts off of the surface that you are lying on. Do not  move your head, neck, or back. 4. Hold for __________ seconds. 5. Slowly return to the starting position. Let your muscles relax completely before you repeat this exercise. Repeat __________ times. Complete this exercise __________ times a day. Exercise E: Scapular retraction  1. Sit in a stable chair without armrests, or stand. 2. Secure an exercise band to a stable object in front of you so the band is at shoulder height. 3. Hold one  end of the exercise band in each hand. Your palms should face down. 4. Squeeze your shoulder blades together and move your elbows slightly behind you. Do not shrug your shoulders while you do this. 5. Hold for __________ seconds. 6. Slowly return to the starting position. Repeat __________ times. Complete this exercise __________ times a day. Exercise F: Shoulder extension  1. Sit in a stable chair without armrests, or stand. 2. Secure an exercise band to a stable object in front of you where the band is above shoulder height. 3. Hold one end of the exercise band in each hand. 4. Straighten your elbows and lift your hands up to shoulder height. 5. Squeeze your shoulder blades together and pull your hands down to the sides of your thighs. Stop when your hands are straight down by your sides. Do not let your hands go behind your body. 6. Hold for __________ seconds. 7. Slowly return to the starting position. Repeat __________ times. Complete this exercise __________ times a day. This information is not intended to replace advice given to you by your health care provider. Make sure you discuss any questions you have with your health care provider. Document Released: 01/11/2005 Document Revised: 09/18/2015 Document Reviewed: 12/14/2014 Elsevier Interactive Patient Education  2019 [employer].

## 2018-02-10 NOTE — Assessment & Plan Note (Signed)
Well-controlled. Continue to monitor. 

## 2018-02-10 NOTE — Assessment & Plan Note (Signed)
Patient with severe aortic stenosis.  Refer to cardiology in [place] to establish care.

## 2018-02-10 NOTE — Assessment & Plan Note (Signed)
Continue Crestor. Check labs. 

## 2018-02-10 NOTE — Assessment & Plan Note (Signed)
Symptoms improving.  She will complete Epley maneuvers at home.

## 2018-02-10 NOTE — Assessment & Plan Note (Signed)
Seems to be consistent with migraines.  Recent CT scan reassuring.  She will monitor and if they become more frequent we could refer to neurology.

## 2018-02-10 NOTE — Assessment & Plan Note (Signed)
I suspect strain of the muscles and tendons of the dorsum of her foot.  She will monitor this.  We will check labs given report of cramping.

## 2018-02-10 NOTE — Assessment & Plan Note (Signed)
Symptoms are most consistent with rotator cuff impingement.  She will complete exercises.  If not improving we could refer to orthopedics.

## 2018-02-10 NOTE — Progress Notes (Signed)
Donna Rumps, MD Phone: [phone]  Donna Rios is a 68 y.o. female who presents today for follow-up.  CC: Elevated blood pressure, hyperlipidemia, left shoulder pain, vertigo, foot pain, migraines  Hypertension: Running 120s-130s/70s since she saw me earlier this week.  Hyperlipidemia: Taking Crestor.  No abdominal pain or myalgias.  Patient reports she did quite a bit of walking yesterday and developed dorsal foot discomfort bilaterally.  She put icy hot on it and it did improve somewhat.  Felt like cramps.  She notes her left shoulder is still bothering her.  Her neck has improved.  Notes discomfort in her posterior shoulder.  She had no injury.  Hurts with certain movements.  Taking some ibuprofen for this.  Vertigo has improved to some degree.  Only had mild symptoms yesterday.  CT scan previously reassuring.  Patient has a history of migraines with aura.  She typically would see a jagged line in her right eye and then subsequently developed a migraine.  This last occurred yesterday.  Improved with ibuprofen.  No neurological deficits with this.  Severe aortic stenosis: Patient reports she would like to establish care in [place] given that that would be where she would have to have a procedure.  She did see Dr. Rockey Situ previously.  Social History   Tobacco Use  Smoking Status Current Every Day Smoker  . Packs/day: 0.50  . Years: 46.00  . Pack years: 23.00  . Types: Cigarettes  Smokeless Tobacco Never Used     ROS see history of present illness  Objective  Physical Exam Vitals:   02/10/18 1000  BP: 126/80  Pulse: 83  Resp: 17  Temp: 98 F (36.7 C)  SpO2: 98%    BP Readings from Last 3 Encounters:  02/10/18 126/80  02/06/18 (!) 150/98  01/11/18 (!) 142/88   Wt Readings from Last 3 Encounters:  02/10/18 214 lb 8 oz (97.3 kg)  02/06/18 214 lb (97.1 kg)  01/02/18 216 lb 4 oz (98.1 kg)    Physical Exam Constitutional:      General: She is not in  acute distress.    Appearance: She is not diaphoretic.  Cardiovascular:     Rate and Rhythm: Normal rate and regular rhythm.     Heart sounds: Murmur (3/6 systolic ejection murmur right upper sternal border) present.  Pulmonary:     Effort: Pulmonary effort is normal.     Breath sounds: Normal breath sounds.  Musculoskeletal:     Comments: Full range of motion bilateral shoulders, left shoulder with slight tenderness over the lateral portion of the trapezius muscle, no other tenderness, no bony tenderness, there is discomfort on active and passive internal rotation, no discomfort on external rotation or abduction, positive empty can on the left, right shoulder with no discomfort on range of motion, slight tenderness over the musculature of the dorsum of the right foot, no bony tenderness, no tenderness of the left foot  Skin:    General: Skin is warm and dry.  Neurological:     Mental Status: She is alert.     Comments: CN 2-12 intact, 5/5 strength in bilateral biceps, triceps, grip, quads, hamstrings, plantar and dorsiflexion, sensation to light touch intact in bilateral UE and LE, normal gait      Assessment/Plan: Please see individual problem list.  Hyperlipidemia Continue Crestor.  Check labs.  Heart murmur Patient with severe aortic stenosis.  Refer to cardiology in [place] to establish care.  BPPV (benign paroxysmal positional vertigo) Symptoms improving.  She will complete Epley maneuvers at home.  Rotator cuff impingement syndrome of left shoulder Symptoms are most consistent with rotator cuff impingement.  She will complete exercises.  If not improving we could refer to orthopedics.  Chronic headaches Seems to be consistent with migraines.  Recent CT scan reassuring.  She will monitor and if they become more frequent we could refer to neurology.  Foot pain, bilateral I suspect strain of the muscles and tendons of the dorsum of her foot.  She will monitor this.  We  will check labs given report of cramping.  Elevated BP without diagnosis of hypertension Well-controlled.  Continue to monitor.   Orders Placed This Encounter  Procedures  . Comp Met (CMET)  . Lipid panel  . Ambulatory referral to Cardiology    Referral Priority:   Routine    Referral Type:   Consultation    Referral Reason:   Specialty Services Required    Requested Specialty:   Cardiology    Number of Visits Requested:   1    No orders of the defined types were placed in this encounter.    Donna Rumps, MD [place]

## 2018-02-14 ENCOUNTER — Other Ambulatory Visit: Payer: Self-pay

## 2018-02-14 MED ORDER — ROSUVASTATIN CALCIUM 40 MG PO TABS: 40.0000 mg | ORAL_TABLET | Freq: Every day | ORAL | 2 refills | Status: DC

## 2018-03-16 ENCOUNTER — Telehealth: Payer: Self-pay | Admitting: Radiology

## 2018-03-16 DIAGNOSIS — E785 Hyperlipidemia, unspecified: Secondary | ICD-10-CM

## 2018-03-16 NOTE — Telephone Encounter (Signed)
Ordered

## 2018-03-16 NOTE — Addendum Note (Signed)
Addended by: [name] on: 03/16/2018 03:24 PM   Modules accepted: Orders

## 2018-03-16 NOTE — Telephone Encounter (Signed)
Pt coming in for labs tomorrow, please place future orders. Thank you.  

## 2018-03-17 ENCOUNTER — Other Ambulatory Visit: Payer: Medicare Other

## 2018-03-20 ENCOUNTER — Encounter: Payer: Self-pay | Admitting: Cardiology

## 2018-03-20 ENCOUNTER — Other Ambulatory Visit (INDEPENDENT_AMBULATORY_CARE_PROVIDER_SITE_OTHER): Payer: Medicare Other

## 2018-03-20 DIAGNOSIS — E785 Hyperlipidemia, unspecified: Secondary | ICD-10-CM

## 2018-03-20 LAB — LDL CHOLESTEROL, DIRECT: Direct LDL: 93 mg/dL

## 2018-03-20 LAB — HEPATIC FUNCTION PANEL
ALT: 15 U/L (ref 0–35)
AST: 17 U/L (ref 0–37)
Albumin: 4.2 g/dL (ref 3.5–5.2)
Alkaline Phosphatase: 79 U/L (ref 39–117)
BILIRUBIN DIRECT: 0.1 mg/dL (ref 0.0–0.3)
BILIRUBIN TOTAL: 0.5 mg/dL (ref 0.2–1.2)
TOTAL PROTEIN: 6.7 g/dL (ref 6.0–8.3)

## 2018-03-29 ENCOUNTER — Other Ambulatory Visit: Payer: Self-pay | Admitting: Family Medicine

## 2018-04-03 ENCOUNTER — Telehealth: Payer: Self-pay | Admitting: Cardiovascular Disease

## 2018-04-03 DIAGNOSIS — R011 Cardiac murmur, unspecified: Secondary | ICD-10-CM

## 2018-04-03 NOTE — Telephone Encounter (Signed)
Patient ready to discuss and schedule cardiac cath at [facility] only .    Please call patient to schedule.

## 2018-04-06 ENCOUNTER — Ambulatory Visit: Payer: Medicare Other | Admitting: Interventional Cardiology

## 2018-04-06 NOTE — Telephone Encounter (Signed)
She needs echo, last was in 05/2017 She needs clinic visit for new H&P before procedure, requirement We can set up cath anywhere she likes at that visit, Would need labs on that visit precath

## 2018-04-07 NOTE — Telephone Encounter (Signed)
Returned call to patient with suggestions from provider. Pt agreeable to POC. Scheduled for Echo and OV with Dr. Rockey Situ.    Order placed for Echo.   As a side note, pt reports that she does not want to have cath at [facility] but wants to go to [facility] for procedure.   Message routed to provider as Juluis Rainier.

## 2018-04-10 ENCOUNTER — Ambulatory Visit: Payer: Medicare Other | Admitting: Podiatry

## 2018-04-10 ENCOUNTER — Encounter: Payer: Self-pay | Admitting: Podiatry

## 2018-04-10 ENCOUNTER — Ambulatory Visit (INDEPENDENT_AMBULATORY_CARE_PROVIDER_SITE_OTHER): Payer: Medicare Other

## 2018-04-10 ENCOUNTER — Other Ambulatory Visit: Payer: Self-pay

## 2018-04-10 VITALS — BP 135/82 | HR 84

## 2018-04-10 DIAGNOSIS — L608 Other nail disorders: Secondary | ICD-10-CM

## 2018-04-10 DIAGNOSIS — M79675 Pain in left toe(s): Secondary | ICD-10-CM

## 2018-04-10 DIAGNOSIS — M79674 Pain in right toe(s): Secondary | ICD-10-CM | POA: Diagnosis not present

## 2018-04-10 DIAGNOSIS — B351 Tinea unguium: Secondary | ICD-10-CM

## 2018-04-10 DIAGNOSIS — R011 Cardiac murmur, unspecified: Secondary | ICD-10-CM | POA: Diagnosis not present

## 2018-04-10 LAB — ECHOCARDIOGRAM COMPLETE

## 2018-04-10 NOTE — Progress Notes (Signed)
Complaint:  Visit Type: Patient presents  to my office for  preventative foot care services. Complaint: Patient states" my nails have grown long and thick and become painful to walk and wear shoes" Patient has history of foot surgery on her left foot. The patient presents for preventative foot care services. No changes to ROS  Podiatric Exam: Vascular: dorsalis pedis and posterior tibial pulses are palpable bilateral. Capillary return is immediate. Temperature gradient is WNL. Skin turgor WNL  Sensorium: Normal Semmes Weinstein monofilament test. Normal tactile sensation bilaterally. Nail Exam: Pt has thick disfigured discolored nails with subungual debris noted bilateral entire nail hallux through fifth toenails Ulcer Exam: There is no evidence of ulcer or pre-ulcerative changes or infection. Orthopedic Exam: Muscle tone and strength are WNL. No limitations in general ROM. No crepitus or effusions noted. HAV 1st MPJ  B/L.  Hammer toes 2-4  B/L. Skin: No Porokeratosis. No infection or ulcers  Diagnosis:  Onychomycosis, , Pain in right toe, pain in left toes  Treatment & Plan Procedures and Treatment: Consent by patient was obtained for treatment procedures.   Debridement of mycotic and hypertrophic toenails, 1 through 5 bilateral and clearing of subungual debris. No ulceration, no infection noted.  Return Visit-Office Procedure: Patient instructed to return to the office for a follow up visit 3 months for continued evaluation and treatment.    Gardiner Barefoot DPM

## 2018-04-13 ENCOUNTER — Telehealth: Payer: Self-pay | Admitting: *Deleted

## 2018-04-13 NOTE — Telephone Encounter (Signed)
-----   Message from Minna Merritts, MD sent at 04/11/2018  8:12 AM EDT ----- Echo shows severe aortic valve stenosis Options include come in for a visit with me and we can set up cardiac catheterization wherever she would like Second option would be we can set up office visit with TAVR team, and they can guide everything, they would do catheterization after their consultation

## 2018-04-13 NOTE — Telephone Encounter (Signed)
Results called to pt. Pt verbalized understanding. Patient is able to come see Dr Rockey Situ tomorrow.  Appt scheduled and COVID19 prescreen done.        COVID-19 Pre-Screening Questions:  . Do you currently have a fever? NO (yes = cancel and refer to pcp for e-visit) . Have you recently travelled on a cruise, internationally, or to [place], [place], [place], [place], [place], or [place], [place] [employer]) ? NO (yes = cancel, stay home, monitor symptoms, and contact pcp or initiate e-visit if symptoms develop) . Have you been in contact with someone that is currently pending confirmation of Covid19 testing or has been confirmed to have the [place] virus?  NO (yes = cancel, stay home, away from tested individual, monitor symptoms, and contact pcp or initiate e-visit if symptoms develop) . Are you currently experiencing fatigue or cough? YES (yes = pt should be prepared to have a mask placed at the time of their visit).

## 2018-04-14 ENCOUNTER — Encounter: Payer: Self-pay | Admitting: Cardiovascular Disease

## 2018-04-14 ENCOUNTER — Other Ambulatory Visit: Payer: Self-pay

## 2018-04-14 ENCOUNTER — Ambulatory Visit: Payer: Medicare Other | Admitting: Cardiovascular Disease

## 2018-04-14 VITALS — BP 140/72 | HR 93 | Ht 68.0 in | Wt 216.0 lb

## 2018-04-14 DIAGNOSIS — R011 Cardiac murmur, unspecified: Secondary | ICD-10-CM

## 2018-04-14 NOTE — Patient Instructions (Addendum)
Medication Instructions:  No changes  Try zyrtec/cetirazine 1/2 to whole pill  If you need a refill on your cardiac medications before your next appointment, please call your pharmacy.   Lab work: No new labs needed  If you have labs (blood work) drawn today and your tests are completely normal, you will receive your results only by: Marland Kitchen MyChart Message (if you have MyChart) OR . A paper copy in the mail If you have any lab test that is abnormal or we need to change your treatment, we will call you to review the results.   Testing/Procedures:  We will send a message to the TAVR team about timing of cardiac cath For TAVR consultation   Follow-Up: At [facility], you and your health needs are our priority.  As part of our continuing mission to provide you with exceptional heart care, we have created designated Provider Care Teams.  These Care Teams include your primary Cardiologist (physician) and Advanced Practice Providers (APPs -  Physician Assistants and Nurse Practitioners) who all work together to provide you with the care you need, when you need it.  . You will need a follow up appointment in 6 months .   Please call our office 2 months in advance to schedule this appointment.    . Providers on your designated Care Team:   . Murray Hodgkins, NP . Christell Faith, PA-C . Marrianne Mood, PA-C  Any Other Special Instructions Will Be Listed Below (If Applicable).  For educational health videos Log in to : [email or web address] Or : [email or web address], password : [email or web address]

## 2018-04-14 NOTE — Progress Notes (Signed)
Cardiology Office Note  Date:  04/14/2018   ID:  HEAVENLY CHRISTINE, DOB [birth year], MRN [id number]  PCP:  [name], MD   Chief Complaint  Patient presents with  . other    Echo results, poss needing a cath.CP /slight discomfort in chest comes and goes. Medications reviewed verbally with the patient.    HPI:  Ms. Osie Merkin is a 68 y.o. woman with past medical history of Hyperlipidemia Smoking Severe aortic valve stenosis Venous insufficiency  Who presents to the office today for severe aortic valve stenosis, shortness of breath.  INTERVAL HISTORY: In follow-up today she reports that she is having some shortness of breath on exertion, not very active at baseline Limited by some orthopedic issues, SI joint, neuropathy, sciatica  Most of this visit spent discussing her recent echocardiogram results from 04/10/2018.   ejection fraction is normal (60 - 65%). The right side was also normal. Her aortic valve appears to have two leaflets rather than three. It had a valve area of 0.79 cm.  Markedly elevated mean and peak systolic pressure  She continues to smoke, currently less than 1 pack/day She has been told that she's in early stages of developing COPD. She is aware that she needs to quit smoking.  She is concerned about shortness of breath and will be monitoring herself in case she experiences it.   She brought a notebook with recent blood pressure readings. Most of them are 120-130, with the occasional 140-150 reading.    Had some intermittent chest pain a few months ago. Felt the pain was deep in her chest. She is unaware of pain with palpation of the left chest  She has been having seasonal allergies recently and has been taking Claritin, Flonase but still with symptoms  She is compliant with her Crestor. Aware that she needs to lose weight and asked for recommendations on diet changes.   Blood pressure 140/72 Total Chol 191/ LDL 93 CR 0.77 Glucose 106  EKG personally  reviewed by myself on todays visit Shows normal sinus rhythm. 93 bpm. ST & T wave abnormality, consider lateral ischemia.   OTHER PAST MEDICAL HISTORY REVIEWED BY ME FOR TODAY'S VISIT: Most recent echocardiogram in March 2020 Normal ejection fraction, 60 - 65% Left ventricle normal Right ventricle normal Mean gradient 60.5 mmHg Peak gradient 96.4 mmHg Aortic valve is bicuspid rather than tricuspid Aortic valve area 0.79 cm  Echocardiogram May 2019 Normal ejection fraction Mean gradient 46 mmHg Peak gradient 73 mmHg Take velocity 432 cm per sec and Valve area less than 1 cm  6 surgeries on left foot, walks slower but still very active.   Retired, does garden work, worked in a [employer] for 32 yrs.   PMH:   has a past medical history of Allergic rhinitis, Arthritis, Heart murmur, Hypertension, Phlebitis, RUQ pain (06/21/2017), and Stress incontinence.  Aortic valve stenosis Smoker  PSH:    Past Surgical History:  Procedure Laterality Date  . BREAST BIOPSY Left    ducts removed  . Cataract surgery    . COLONOSCOPY WITH PROPOFOL N/A 09/29/2017   Procedure: COLONOSCOPY WITH PROPOFOL;  Surgeon: Lin Landsman, MD;  Location: [facility] ENDOSCOPY;  Service: Gastroenterology;  Laterality: N/A;  . EYE SURGERY    . FOOT SURGERY     7  . KNEE ARTHROSCOPY Right   . LAPAROSCOPIC HYSTERECTOMY    . VARICOSE VEIN SURGERY      Current Outpatient Medications  Medication Sig Dispense Refill  .  acetaminophen (TYLENOL) 650 MG CR tablet Take 650 mg by mouth.    . APPLE CIDER VINEGAR PO Take 1 tablet by mouth daily as needed.    . cholecalciferol (VITAMIN D) 400 units TABS tablet Take 2,000 Units by mouth.    . Cyanocobalamin (B-12) 100 MCG TABS Take by mouth.    . fluticasone (FLONASE) 50 MCG/ACT nasal spray Place 1 spray into both nostrils daily.    Marland Kitchen ibuprofen (ADVIL,MOTRIN) 200 MG tablet Take 200 mg by mouth every 8 (eight) hours as needed.    . loratadine (CLARITIN) 10 MG tablet Take 10  mg by mouth daily.    . Menthol, Topical Analgesic, (ICY HOT EX) Apply topically as needed (muscle pain).    . naphazoline-glycerin (CLEAR EYES) 0.012-0.2 % SOLN Apply to eye.    . Probiotic Product (PROBIOTIC ADVANCED PO) Take by mouth.    . rosuvastatin (CRESTOR) 40 MG tablet Take 1 tablet (40 mg total) by mouth daily. 90 tablet 2  . sodium chloride (OCEAN) 0.65 % SOLN nasal spray Place 1 spray into both nostrils as needed for congestion.     No current facility-administered medications for this visit.      Allergies:   Ibuprofen and Asa [aspirin]   Social History:  The patient  reports that she has been smoking cigarettes. She has a 23.00 pack-year smoking history. She has never used smokeless tobacco. She reports current alcohol use. She reports that she does not use drugs.   Family History:   family history includes Alcoholism in an other family member; Arthritis in an other family member; Diabetes in her mother and another family member; Heart disease in an other family member; Heart failure in her father and mother; Hypertension in her mother and another family member; Hypotension in her father; Lung cancer in an other family member; Stroke in an other family member.    Review of Systems: Review of Systems  Constitutional: Negative.   Eyes: Negative.   Respiratory: Negative.   Cardiovascular: Positive for chest pain.  Gastrointestinal: Negative.   Genitourinary: Negative.   Musculoskeletal: Negative.   Neurological: Negative.   Psychiatric/Behavioral: Negative.   All other systems reviewed and are negative.   PHYSICAL EXAM: VS:  BP 140/72 (BP Location: Left Arm, Patient Position: Sitting, Cuff Size: Normal)   Pulse 93   Ht 5\' 8"  (1.727 m)   Wt 216 lb (98 kg)   BMI 32.84 kg/m  , BMI Body mass index is 32.84 kg/m.  Constitutional:  oriented to person, place, and time. No distress.  HENT: normal Head: Grossly normal Eyes:  no discharge. No scleral icterus.  Neck: No  JVD, no carotid bruits  Cardiovascular: Regular rate and rhythm, 3/6 systolic ejection murmur appreciated right sternal border Pulmonary/Chest: Clear to auscultation bilaterally, no wheezes or rales Abdominal: Soft.  no distension.  no tenderness.  Musculoskeletal: Normal range of motion Neurological:  normal muscle tone. Coordination normal. No atrophy Skin: Skin warm and dry Psychiatric: normal affect, pleasant  Recent Labs: 05/09/2017: Hemoglobin 13.6; Platelets 211.0; TSH 0.87 02/10/2018: BUN 10; Creatinine, Ser 0.77; Potassium 4.5; Sodium 140 03/20/2018: ALT 15    Lipid Panel Lab Results  Component Value Date   CHOL 191 02/10/2018   HDL 52.30 02/10/2018   TRIG 286.0 (H) 02/10/2018      Wt Readings from Last 3 Encounters:  04/14/18 216 lb (98 kg)  02/10/18 214 lb 8 oz (97.3 kg)  02/06/18 214 lb (97.1 kg)     ASSESSMENT  AND PLAN:  Severe aortic valve stenosis Plan: EKG 12-Lead Long discussion with her concerning recent echocardiogram results We discussed various treatment options for her aortic valve We have recommended cardiac catheterization, She prefers to have this done in [place] -We will contact the team in [place] to have this arranged with follow-up in TAVR clinic to discuss her options -We did discuss sternotomy, minimally invasive surgical as well as TAVR Discussed each procedure in detail She did not commit one way or another -We did recommend she start metoprolol but she does not want any medication changes at this time as she feels she does not need any despite some shortness of breath on exertion -We did discuss risk and benefit of cardiac catheterization I have reviewed the risks, indications, and alternatives to cardiac catheterization, possible angioplasty, and stenting with the patient. Risks include but are not limited to bleeding, infection, vascular injury, stroke, myocardial infection, arrhythmia, kidney injury, radiation-related injury in the  case of prolonged fluoroscopy use, emergency cardiac surgery, and death. The patient understands the risks of serious complication is 1-2 in 7322 with diagnostic cardiac cath and 1-2% or less with angioplasty/stenting.   Tobacco abuse Difficulty reducing her smoking, still smoking 25-minute spent discussing smoking cessation goals  Prediabetes Unable to exercise, weight running higher Recommended low carbohydrate diet, dietary guide provided and discussed with her in detail  Mixed hyperlipidemia Continue Crestor Goal LDL less than 70, high risk of coronary disease   Disposition:   F/U  6 months   Total encounter time more than 45 minutes  Greater than 50% was spent in counseling and coordination of care with the patient    No orders of the defined types were placed in this encounter.  I, Jesus Reyes am acting as a Education administrator for [name], M.D., Ph.D.  I, [name], M.D. Ph.D., have reviewed the above documentation for accuracy and completeness, and I agree with the above.    Signed, Esmond Plants, M.D., Ph.D. 04/14/2018  [place], [place]

## 2018-04-29 IMAGING — DX DG KNEE COMPLETE 4+V*R*
5 series · 5 of 5 positions shown · non-contrast
Comparison: None.

CLINICAL DATA: Chronic right knee pain, worse stepping up stairs.

EXAM:
RIGHT KNEE - COMPLETE 4+ VIEW

[knee standing ap]
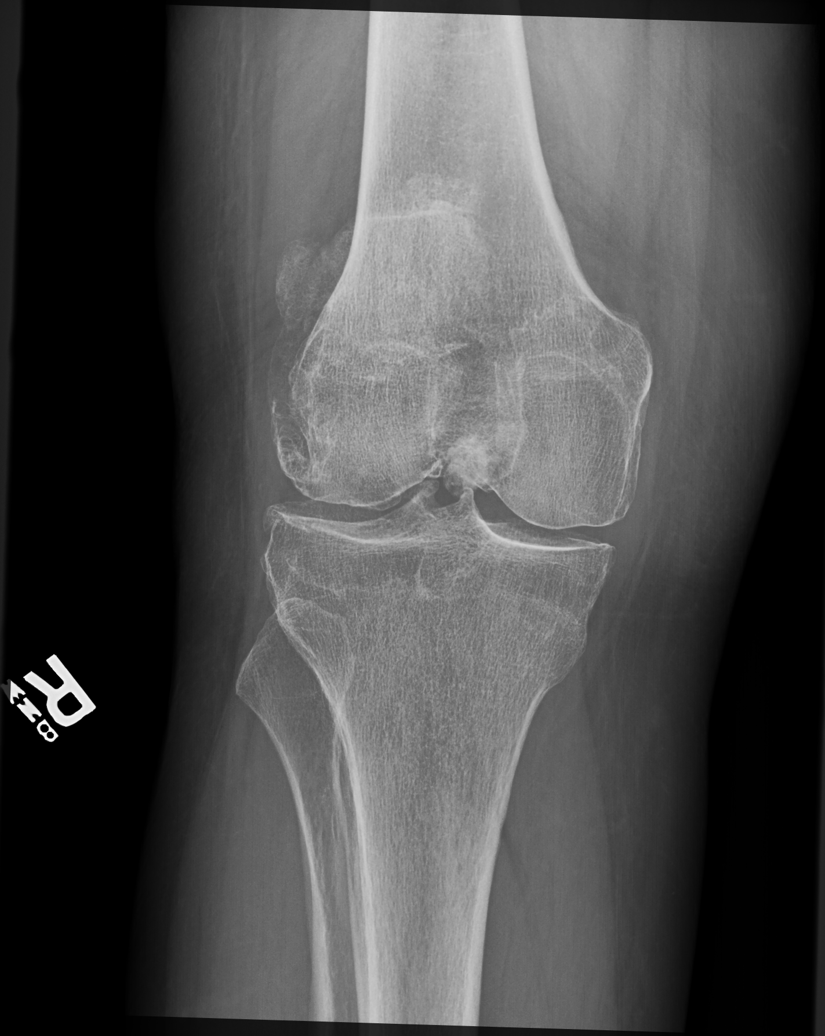

[knee standing external ap]
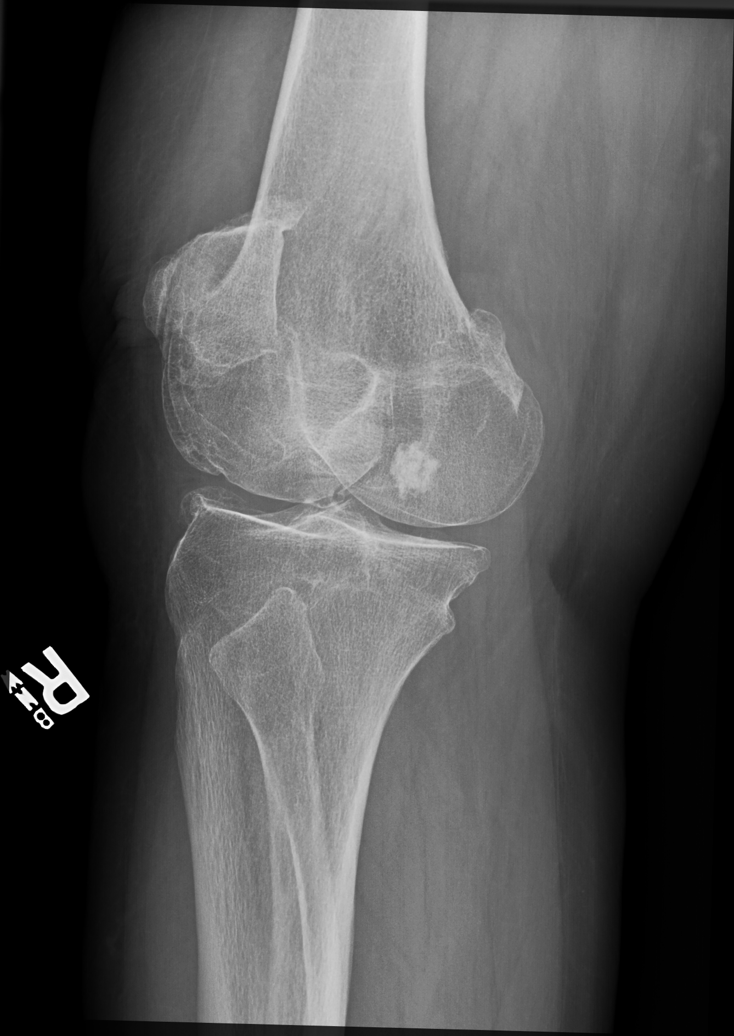

[knee standing internal ap]
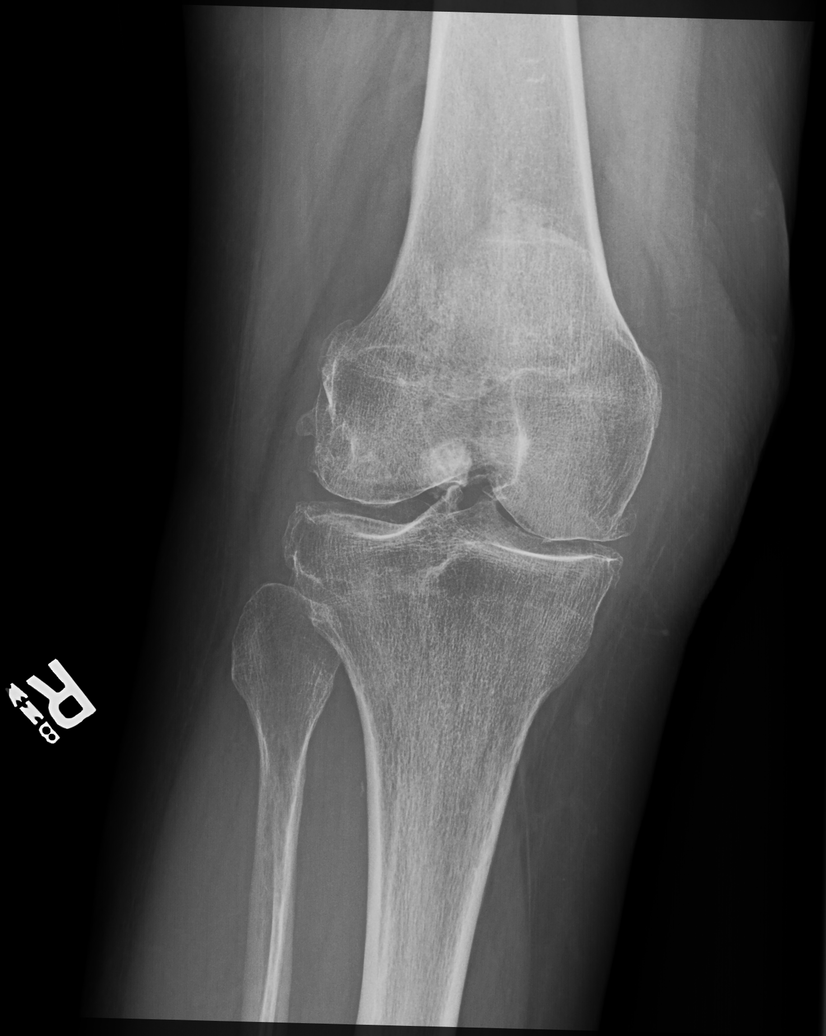

[knee standing lat]
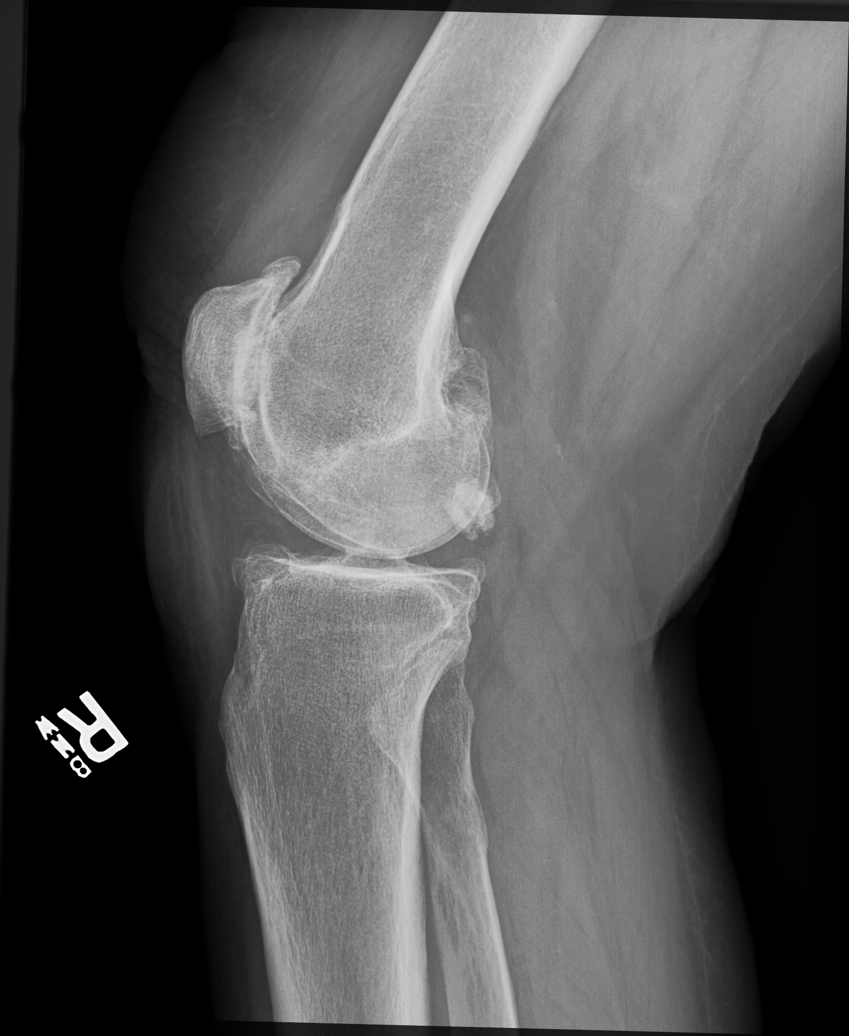

[knee [person_name] view pa]
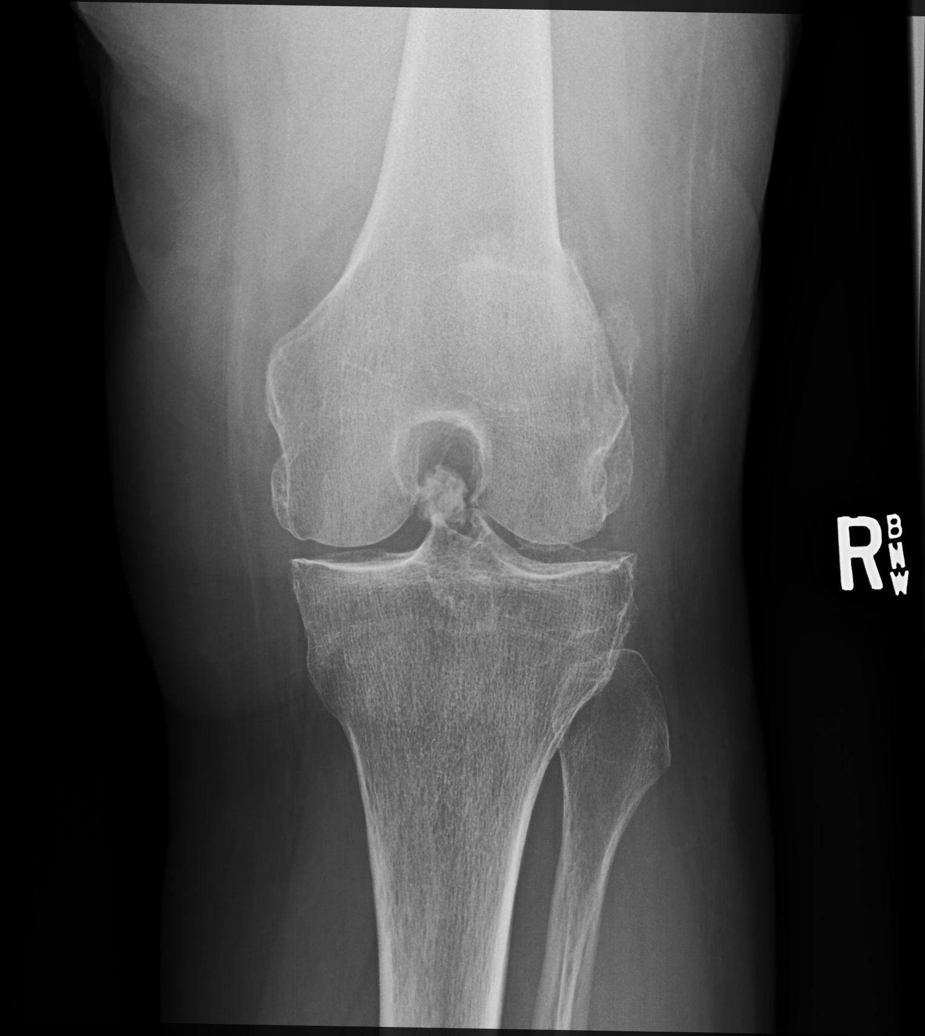

[5 of 5 positions shown; findings below may reference images not displayed]

FINDINGS: Diffuse osteoarthritis with marginal spurring that is bulky. Medial
and lateral compartment narrowing is mild, patellofemoral
compartment narrowing is advanced. 18 mm intra-articular body in the
posterior joint line. Osteopenia. No superimposed fracture or
erosion. Negative for joint effusion.
IMPRESSION: Advanced osteoarthritis with posterior articular body. Joint
narrowing is worst at the patellofemoral compartment.

Osteopenia.

## 2018-05-01 ENCOUNTER — Ambulatory Visit: Payer: Medicare Other | Admitting: Cardiovascular Disease

## 2018-05-17 ENCOUNTER — Institutional Professional Consult (permissible substitution): Payer: Medicare Other | Admitting: Cardiovascular Disease

## 2018-06-06 ENCOUNTER — Encounter: Payer: Self-pay | Admitting: Cardiovascular Disease

## 2018-06-06 ENCOUNTER — Telehealth (INDEPENDENT_AMBULATORY_CARE_PROVIDER_SITE_OTHER): Payer: Medicare Other | Admitting: Cardiovascular Disease

## 2018-06-06 ENCOUNTER — Other Ambulatory Visit: Payer: Self-pay

## 2018-06-06 VITALS — BP 144/84 | HR 82 | Ht 68.0 in | Wt 216.2 lb

## 2018-06-06 DIAGNOSIS — I350 Nonrheumatic aortic (valve) stenosis: Secondary | ICD-10-CM

## 2018-06-06 NOTE — Progress Notes (Signed)
Virtual Visit via Video Note   This visit type was conducted due to national recommendations for restrictions regarding the COVID-19 Pandemic (e.g. social distancing) in an effort to limit this patient's exposure and mitigate transmission in our community.  Due to her co-morbid illnesses, this patient is at least at moderate risk for complications without adequate follow up.  This format is felt to be most appropriate for this patient at this time.  All issues noted in this document were discussed and addressed.  A limited physical exam was performed with this format.  Please refer to the patient's chart for her consent to telehealth for [facility].   Date:  06/06/2018   ID:  Donna Rios, DOB March 02, 1950, MRN [id number]  Patient Location: Home Provider Location: Home  PCP:  [name], MD  Cardiologist:  No primary care provider on file.  Electrophysiologist:  None   Evaluation Performed:  New Patient Evaluation  Chief Complaint:  Shortness of breath  History of Present Illness:    Donna Rios is a 68 y.o. female with severe aortic stenosis, referred for further evaluation of treatment options by Dr. Rockey Situ.  The patient does not have symptoms concerning for COVID-19 infection (fever, chills, cough, or new shortness of breath).   The patient is interviewed via Arts administrator in light of the current COVID-19 pandemic. She was first told of a heart murmur by her physician at [place] approximately 20 years ago. States that her father had a 'heart murmur' too, but he lived until his late 73's.   She recently had an echocardiogram suggesting bicuspid aortic valve morphology.  Other notable findings include preserved LV systolic function with normal LVEF of 65%, normal RV size and function, and Doppler findings consistent with very severe aortic stenosis with a mean transvalvular gradient of 60 mmHg.  The patient is married with one stepson.  She is retired from the [employer] for 32 years. She's had a lot of trouble with her feet over the years and this has kept her from being more active. She also has arthritis-related limitations with pain in her knees and hips. She does admit to mild shortness of breath with physical activity but doesn't feel particularly limited with her normal routine. She also has occasional chest pain, but hasn't related this to physical exertion. No lightheadedness or syncope. No orthopnea or PND. The patient is a longtime smoker and is trying to cut back/quit.   Past Medical History:  Diagnosis Date   Allergic rhinitis    Arthritis    Heart murmur    Hypertension    Phlebitis    RUQ pain 06/21/2017   From the NM Hepato w Eject Fract order   Stress incontinence    Past Surgical History:  Procedure Laterality Date   BREAST BIOPSY Left    ducts removed   Cataract surgery     COLONOSCOPY WITH PROPOFOL N/A 09/29/2017   Procedure: COLONOSCOPY WITH PROPOFOL;  Surgeon: Lin Landsman, MD;  Location: [facility] ENDOSCOPY;  Service: Gastroenterology;  Laterality: N/A;   EYE SURGERY     FOOT SURGERY     7   KNEE ARTHROSCOPY Right    LAPAROSCOPIC HYSTERECTOMY     VARICOSE VEIN SURGERY       Current Meds  Medication Sig   acetaminophen (TYLENOL) 650 MG CR tablet Take 650 mg by mouth as needed for fever.    APPLE CIDER VINEGAR PO Take 1 tablet by mouth daily  as needed.   b complex vitamins tablet Take 1 tablet by mouth every other day.   cetirizine (ZYRTEC) 10 MG tablet Take 10 mg by mouth daily.   cholecalciferol (VITAMIN D) 400 units TABS tablet Take 2,000 Units by mouth.   fluticasone (FLONASE) 50 MCG/ACT nasal spray Place 1 spray into both nostrils daily as needed.    ibuprofen (ADVIL,MOTRIN) 200 MG tablet Take 200 mg by mouth every 8 (eight) hours as needed.   Menthol, Topical Analgesic, (ICY HOT EX) Apply topically as needed (muscle pain).   Multiple Vitamins-Minerals  (COMPLETE WOMENS PO) Take 1 tablet by mouth daily.   naphazoline-glycerin (CLEAR EYES) 0.012-0.2 % SOLN Place 1 drop into both eyes as needed.    Probiotic Product (PROBIOTIC ADVANCED PO) Take 1 tablet by mouth as needed.    Propylene Glycol (SYSTANE COMPLETE OP) Apply 1 drop to eye as needed.   rosuvastatin (CRESTOR) 40 MG tablet Take 1 tablet (40 mg total) by mouth daily.   sodium chloride (OCEAN) 0.65 % SOLN nasal spray Place 1 spray into both nostrils as needed for congestion.     Allergies:   Ibuprofen and Asa [aspirin]   Social History   Tobacco Use   Smoking status: Current Every Day Smoker    Packs/day: 0.50    Years: 46.00    Pack years: 23.00    Types: Cigarettes   Smokeless tobacco: Never Used  Substance Use Topics   Alcohol use: Yes    Alcohol/week: 0.0 standard drinks   Drug use: No     Family Hx: The patient's family history includes Alcoholism in an other family member; Arthritis in an other family member; Diabetes in her mother and another family member; Heart disease in an other family member; Heart failure in her father and mother; Hypertension in her mother and another family member; Hypotension in her father; Lung cancer in an other family member; Stroke in an other family member. There is no history of Breast cancer.  ROS:   Please see the history of present illness.    All other systems reviewed and are negative.   Prior CV studies:   The following studies were reviewed today:  2D echocardiogram 04/10/2018: IMPRESSIONS    1. The left ventricle has normal systolic function with an ejection fraction of 60-65%. The cavity size was normal. Left ventricular diastolic Doppler parameters are consistent with impaired relaxation.  2. The right ventricle has normal systolic function. The cavity was normal. There is no increase in right ventricular wall thickness.  3. Possible bicuspid aortic valve. Moderate thickening of the aortic valve Severe  calcifcation of the aortic valve. Aortic valve regurgitation is trivial by color flow Doppler. severe stenosis of the aortic valve. Mean gradient of 59 mm Hg with valve  area of 0.73 .  4. The mitral valve is grossly normal.  5. The tricuspid valve is grossly normal.  LEFT VENTRICLE PLAX 2D LVIDd:         5.20 cm       Diastology LVIDs:         2.90 cm       LV e' lateral:   6.53 cm/s LV PW:         1.10 cm       LV E/e' lateral: 12.9 LV IVS:        1.00 cm       LV e' medial:    5.77 cm/s LVOT diam:     1.80 cm  LV E/e' medial:  14.6 LV SV:         97 ml LV SV Index:   44.37 LVOT Area:     2.54 cm   LV Volumes (MOD) LV area d, A2C:    21.30 cm LV area d, A4C:    23.70 cm LV area s, A2C:    11.60 cm LV area s, A4C:    13.10 cm LV major d, A2C:   7.60 cm LV major d, A4C:   7.53 cm LV major s, A2C:   6.54 cm LV major s, A4C:   6.56 cm LV vol d, MOD A2C: 51.1 ml LV vol d, MOD A4C: 62.7 ml LV vol s, MOD A2C: 17.8 ml LV vol s, MOD A4C: 23.1 ml LV SV MOD A2C:     33.3 ml LV SV MOD A4C:     62.7 ml LV SV MOD BP:      36.6 ml  RIGHT VENTRICLE RV S prime:     12.60 cm/s TAPSE (M-mode): 2.8 cm RVSP:           20.5 mmHg  LEFT ATRIUM             Index       RIGHT ATRIUM           Index LA diam:        3.40 cm 1.61 cm/m  RA Pressure: 10 mmHg LA Vol (A2C):   59.9 ml 28.45 ml/m RA Area:     11.60 cm LA Vol (A4C):   45.8 ml 21.75 ml/m RA Volume:   27.10 ml  12.87 ml/m LA Biplane Vol: 55.9 ml 26.55 ml/m  AORTIC VALVE                    PULMONIC VALVE AV Area (Vmean):   0.69 cm     PV Vmax:       1.06 m/s AV Area (VTI):     0.79 cm     PV Vmean:      84.700 cm/s AV Vmax:           491.00 cm/s  PV VTI:        0.235 m AV Vmean:          368.000 cm/s PV Peak grad:  4.5 mmHg AV VTI:            1.037 m      PV Mean grad:  3.0 mmHg AV Peak Grad:      96.4 mmHg AV Mean Grad:      60.5 mmHg LVOT Vmean:        100.000 cm/s LVOT VTI:          0.321 m LVOT/AV VTI ratio:  0.31 AR PHT:            360 msec   AORTA Ao Root diam: 3.20 cm  MITRAL VALVE              TRICUSPID VALVE MV Area (PHT): 2.99 cm   TR Peak grad:   10.5 mmHg MV PHT:        73.66 msec TR Vmax:        162.00 cm/s MV Decel Time: 254 msec   RVSP:           20.5 mmHg MV E velocity: 84.50 cm/s MV A velocity: 99.90 cm/s SHUNTS MV E/A ratio:  0.85       Systemic VTI:  0.32 m  Systemic Diam: 1.80 cm  Labs/Other Tests and Data Reviewed:    EKG:  An ECG dated 04/14/2018 was personally reviewed today and demonstrated:  Normal sinus rhythm 93 bpm, nonspecific IVCD, LVH with repolarization abnormality  Recent Labs: 02/10/2018: BUN 10; Creatinine, Ser 0.77; Potassium 4.5; Sodium 140 03/20/2018: ALT 15   Recent Lipid Panel Lab Results  Component Value Date/Time   CHOL 191 02/10/2018 10:24 AM   TRIG 286.0 (H) 02/10/2018 10:24 AM   HDL 52.30 02/10/2018 10:24 AM   CHOLHDL 4 02/10/2018 10:24 AM   LDLDIRECT 93.0 03/20/2018 09:11 AM    Wt Readings from Last 3 Encounters:  06/06/18 216 lb 3.2 oz (98.1 kg)  04/14/18 216 lb (98 kg)  02/10/18 214 lb 8 oz (97.3 kg)     Objective:    Vital Signs:  BP (!) 144/84    Pulse 82    Ht 5\' 8"  (1.727 m)    Wt 216 lb 3.2 oz (98.1 kg)    BMI 32.87 kg/m    VITAL SIGNS:  reviewed The patient is alert, oriented, in no distress.  She is breathing comfortably in normal conversation.  Remaining exam deferred as this is a virtual office visit with video conferencing  STS Risk Calculator (Isolated AVR): Risk of Mortality:  1.041% Renal Failure:  1.109% Permanent Stroke:  0.818% Prolonged Ventilation:  3.745% DSW Infection:  0.096% Reoperation:  2.176% Morbidity or Mortality:  6.441% Short Length of Stay:  51.827% Long Length of Stay:  2.275%  ASSESSMENT & PLAN:    Severe, stage D1 aortic stenosis.  I have personally reviewed the patient's echo images which are suggestive of a bicuspid aortic valve morphology.  The  patient has severely elevated transvalvular gradients with fairly marked progression from last year's echo study, now with a mean transvalvular gradient of 60 mmHg.  LV function is vigorous and there does not appear to be any other significant valvular disease.  The patient has stable New York Heart Association functional class II symptoms of exertional dyspnea.  I have reviewed the natural history of aortic stenosis with the patient today.  This is done via Arts administrator in light of the COVID-19 pandemic.  There are no family members present for our discussion.  We have discussed the limitations of medical therapy and the poor prognosis associated with symptomatic aortic stenosis. We have reviewed potential treatment options, including palliative medical therapy, conventional surgical aortic valve replacement, and transcatheter aortic valve replacement. We discussed treatment options in the context of the patient's specific comorbid medical conditions.   I discussed specific pros and cons of transcatheter aortic valve replacement compared with conventional surgical aortic valve replacement.  I think it is important to proceed with further testing to evaluate treatment options further.  I have recommended a right and left heart catheterization to characterize coronary anatomy and hemodynamics further.  I have reviewed the risks, indications, and alternatives to cardiac catheterization, possible angioplasty, and stenting with the patient. Risks include but are not limited to bleeding, infection, vascular injury, stroke, myocardial infection, arrhythmia, kidney injury, radiation-related injury in the case of prolonged fluoroscopy use, emergency cardiac surgery, and death. The patient understands the risks of serious complication is 1-2 in 3716 with diagnostic cardiac cath and 1-2% or less with angioplasty/stenting.  I have also recommended CT studies to include a gated cardiac CTA and a CTA of the  chest, abdomen, and pelvis to evaluate aortic valve anatomy further and its relationship to the coronary arteries.  This will also better evaluate the patient's aorta in the setting of bicuspid aortic valve disease.  She understands that after her studies are completed, she will be referred for a formal cardiac surgical consultation as part of a multidisciplinary team approach to her care.  COVID-19 Education: The signs and symptoms of COVID-19 were discussed with the patient and how to seek care for testing (follow up with PCP or arrange E-visit).  The importance of social distancing was discussed today.  Time:   Today, I have spent 60 minutes with the patient with telehealth technology discussing the above problems.     Medication Adjustments/Labs and Tests Ordered: Current medicines are reviewed at length with the patient today.  Concerns regarding medicines are outlined above.   Tests Ordered: No orders of the defined types were placed in this encounter.   Medication Changes: No orders of the defined types were placed in this encounter.   Disposition:  Follow up as outlined  Signed, Sherren Mocha, MD  06/06/2018 4:16 PM    [facility]

## 2018-06-06 NOTE — H&P (View-Only) (Signed)
Virtual Visit via Video Note   This visit type was conducted due to national recommendations for restrictions regarding the COVID-19 Pandemic (e.g. social distancing) in an effort to limit this patient's exposure and mitigate transmission in our community.  Due to her co-morbid illnesses, this patient is at least at moderate risk for complications without adequate follow up.  This format is felt to be most appropriate for this patient at this time.  All issues noted in this document were discussed and addressed.  A limited physical exam was performed with this format.  Please refer to the patient's chart for her consent to telehealth for [facility].   Date:  06/06/2018   ID:  Donna Rios, DOB 1950/03/04, MRN [id number]  Patient Location: Home Provider Location: Home  PCP:  [name], MD  Cardiologist:  No primary care provider on file.  Electrophysiologist:  None   Evaluation Performed:  New Patient Evaluation  Chief Complaint:  Shortness of breath  History of Present Illness:    Donna Rios is a 68 y.o. female with severe aortic stenosis, referred for further evaluation of treatment options by Dr. Rockey Situ.  The patient does not have symptoms concerning for COVID-19 infection (fever, chills, cough, or new shortness of breath).   The patient is interviewed via Arts administrator in light of the current COVID-19 pandemic. She was first told of a heart murmur by her physician at [place] approximately 20 years ago. States that her father had a 'heart murmur' too, but he lived until his late 12's.   She recently had an echocardiogram suggesting bicuspid aortic valve morphology.  Other notable findings include preserved LV systolic function with normal LVEF of 65%, normal RV size and function, and Doppler findings consistent with very severe aortic stenosis with a mean transvalvular gradient of 60 mmHg.  The patient is married with one stepson.  She is retired from the [employer] for 32 years. She's had a lot of trouble with her feet over the years and this has kept her from being more active. She also has arthritis-related limitations with pain in her knees and hips. She does admit to mild shortness of breath with physical activity but doesn't feel particularly limited with her normal routine. She also has occasional chest pain, but hasn't related this to physical exertion. No lightheadedness or syncope. No orthopnea or PND. The patient is a longtime smoker and is trying to cut back/quit.   Past Medical History:  Diagnosis Date   Allergic rhinitis    Arthritis    Heart murmur    Hypertension    Phlebitis    RUQ pain 06/21/2017   From the NM Hepato w Eject Fract order   Stress incontinence    Past Surgical History:  Procedure Laterality Date   BREAST BIOPSY Left    ducts removed   Cataract surgery     COLONOSCOPY WITH PROPOFOL N/A 09/29/2017   Procedure: COLONOSCOPY WITH PROPOFOL;  Surgeon: Lin Landsman, MD;  Location: [facility] ENDOSCOPY;  Service: Gastroenterology;  Laterality: N/A;   EYE SURGERY     FOOT SURGERY     7   KNEE ARTHROSCOPY Right    LAPAROSCOPIC HYSTERECTOMY     VARICOSE VEIN SURGERY       Current Meds  Medication Sig   acetaminophen (TYLENOL) 650 MG CR tablet Take 650 mg by mouth as needed for fever.    APPLE CIDER VINEGAR PO Take 1 tablet by mouth daily  as needed.   b complex vitamins tablet Take 1 tablet by mouth every other day.   cetirizine (ZYRTEC) 10 MG tablet Take 10 mg by mouth daily.   cholecalciferol (VITAMIN D) 400 units TABS tablet Take 2,000 Units by mouth.   fluticasone (FLONASE) 50 MCG/ACT nasal spray Place 1 spray into both nostrils daily as needed.    ibuprofen (ADVIL,MOTRIN) 200 MG tablet Take 200 mg by mouth every 8 (eight) hours as needed.   Menthol, Topical Analgesic, (ICY HOT EX) Apply topically as needed (muscle pain).   Multiple Vitamins-Minerals  (COMPLETE WOMENS PO) Take 1 tablet by mouth daily.   naphazoline-glycerin (CLEAR EYES) 0.012-0.2 % SOLN Place 1 drop into both eyes as needed.    Probiotic Product (PROBIOTIC ADVANCED PO) Take 1 tablet by mouth as needed.    Propylene Glycol (SYSTANE COMPLETE OP) Apply 1 drop to eye as needed.   rosuvastatin (CRESTOR) 40 MG tablet Take 1 tablet (40 mg total) by mouth daily.   sodium chloride (OCEAN) 0.65 % SOLN nasal spray Place 1 spray into both nostrils as needed for congestion.     Allergies:   Ibuprofen and Asa [aspirin]   Social History   Tobacco Use   Smoking status: Current Every Day Smoker    Packs/day: 0.50    Years: 46.00    Pack years: 23.00    Types: Cigarettes   Smokeless tobacco: Never Used  Substance Use Topics   Alcohol use: Yes    Alcohol/week: 0.0 standard drinks   Drug use: No     Family Hx: The patient's family history includes Alcoholism in an other family member; Arthritis in an other family member; Diabetes in her mother and another family member; Heart disease in an other family member; Heart failure in her father and mother; Hypertension in her mother and another family member; Hypotension in her father; Lung cancer in an other family member; Stroke in an other family member. There is no history of Breast cancer.  ROS:   Please see the history of present illness.    All other systems reviewed and are negative.   Prior CV studies:   The following studies were reviewed today:  2D echocardiogram 04/10/2018: IMPRESSIONS    1. The left ventricle has normal systolic function with an ejection fraction of 60-65%. The cavity size was normal. Left ventricular diastolic Doppler parameters are consistent with impaired relaxation.  2. The right ventricle has normal systolic function. The cavity was normal. There is no increase in right ventricular wall thickness.  3. Possible bicuspid aortic valve. Moderate thickening of the aortic valve Severe  calcifcation of the aortic valve. Aortic valve regurgitation is trivial by color flow Doppler. severe stenosis of the aortic valve. Mean gradient of 59 mm Hg with valve  area of 0.73 .  4. The mitral valve is grossly normal.  5. The tricuspid valve is grossly normal.  LEFT VENTRICLE PLAX 2D LVIDd:         5.20 cm       Diastology LVIDs:         2.90 cm       LV e' lateral:   6.53 cm/s LV PW:         1.10 cm       LV E/e' lateral: 12.9 LV IVS:        1.00 cm       LV e' medial:    5.77 cm/s LVOT diam:     1.80 cm  LV E/e' medial:  14.6 LV SV:         97 ml LV SV Index:   44.37 LVOT Area:     2.54 cm   LV Volumes (MOD) LV area d, A2C:    21.30 cm LV area d, A4C:    23.70 cm LV area s, A2C:    11.60 cm LV area s, A4C:    13.10 cm LV major d, A2C:   7.60 cm LV major d, A4C:   7.53 cm LV major s, A2C:   6.54 cm LV major s, A4C:   6.56 cm LV vol d, MOD A2C: 51.1 ml LV vol d, MOD A4C: 62.7 ml LV vol s, MOD A2C: 17.8 ml LV vol s, MOD A4C: 23.1 ml LV SV MOD A2C:     33.3 ml LV SV MOD A4C:     62.7 ml LV SV MOD BP:      36.6 ml  RIGHT VENTRICLE RV S prime:     12.60 cm/s TAPSE (M-mode): 2.8 cm RVSP:           20.5 mmHg  LEFT ATRIUM             Index       RIGHT ATRIUM           Index LA diam:        3.40 cm 1.61 cm/m  RA Pressure: 10 mmHg LA Vol (A2C):   59.9 ml 28.45 ml/m RA Area:     11.60 cm LA Vol (A4C):   45.8 ml 21.75 ml/m RA Volume:   27.10 ml  12.87 ml/m LA Biplane Vol: 55.9 ml 26.55 ml/m  AORTIC VALVE                    PULMONIC VALVE AV Area (Vmean):   0.69 cm     PV Vmax:       1.06 m/s AV Area (VTI):     0.79 cm     PV Vmean:      84.700 cm/s AV Vmax:           491.00 cm/s  PV VTI:        0.235 m AV Vmean:          368.000 cm/s PV Peak grad:  4.5 mmHg AV VTI:            1.037 m      PV Mean grad:  3.0 mmHg AV Peak Grad:      96.4 mmHg AV Mean Grad:      60.5 mmHg LVOT Vmean:        100.000 cm/s LVOT VTI:          0.321 m LVOT/AV VTI ratio:  0.31 AR PHT:            360 msec   AORTA Ao Root diam: 3.20 cm  MITRAL VALVE              TRICUSPID VALVE MV Area (PHT): 2.99 cm   TR Peak grad:   10.5 mmHg MV PHT:        73.66 msec TR Vmax:        162.00 cm/s MV Decel Time: 254 msec   RVSP:           20.5 mmHg MV E velocity: 84.50 cm/s MV A velocity: 99.90 cm/s SHUNTS MV E/A ratio:  0.85       Systemic VTI:  0.32 m  Systemic Diam: 1.80 cm  Labs/Other Tests and Data Reviewed:    EKG:  An ECG dated 04/14/2018 was personally reviewed today and demonstrated:  Normal sinus rhythm 93 bpm, nonspecific IVCD, LVH with repolarization abnormality  Recent Labs: 02/10/2018: BUN 10; Creatinine, Ser 0.77; Potassium 4.5; Sodium 140 03/20/2018: ALT 15   Recent Lipid Panel Lab Results  Component Value Date/Time   CHOL 191 02/10/2018 10:24 AM   TRIG 286.0 (H) 02/10/2018 10:24 AM   HDL 52.30 02/10/2018 10:24 AM   CHOLHDL 4 02/10/2018 10:24 AM   LDLDIRECT 93.0 03/20/2018 09:11 AM    Wt Readings from Last 3 Encounters:  06/06/18 216 lb 3.2 oz (98.1 kg)  04/14/18 216 lb (98 kg)  02/10/18 214 lb 8 oz (97.3 kg)     Objective:    Vital Signs:  BP (!) 144/84    Pulse 82    Ht 5\' 8"  (1.727 m)    Wt 216 lb 3.2 oz (98.1 kg)    BMI 32.87 kg/m    VITAL SIGNS:  reviewed The patient is alert, oriented, in no distress.  She is breathing comfortably in normal conversation.  Remaining exam deferred as this is a virtual office visit with video conferencing  STS Risk Calculator (Isolated AVR): Risk of Mortality:  1.041% Renal Failure:  1.109% Permanent Stroke:  0.818% Prolonged Ventilation:  3.745% DSW Infection:  0.096% Reoperation:  2.176% Morbidity or Mortality:  6.441% Short Length of Stay:  51.827% Long Length of Stay:  2.275%  ASSESSMENT & PLAN:    Severe, stage D1 aortic stenosis.  I have personally reviewed the patient's echo images which are suggestive of a bicuspid aortic valve morphology.  The  patient has severely elevated transvalvular gradients with fairly marked progression from last year's echo study, now with a mean transvalvular gradient of 60 mmHg.  LV function is vigorous and there does not appear to be any other significant valvular disease.  The patient has stable New York Heart Association functional class II symptoms of exertional dyspnea.  I have reviewed the natural history of aortic stenosis with the patient today.  This is done via Arts administrator in light of the COVID-19 pandemic.  There are no family members present for our discussion.  We have discussed the limitations of medical therapy and the poor prognosis associated with symptomatic aortic stenosis. We have reviewed potential treatment options, including palliative medical therapy, conventional surgical aortic valve replacement, and transcatheter aortic valve replacement. We discussed treatment options in the context of the patient's specific comorbid medical conditions.   I discussed specific pros and cons of transcatheter aortic valve replacement compared with conventional surgical aortic valve replacement.  I think it is important to proceed with further testing to evaluate treatment options further.  I have recommended a right and left heart catheterization to characterize coronary anatomy and hemodynamics further.  I have reviewed the risks, indications, and alternatives to cardiac catheterization, possible angioplasty, and stenting with the patient. Risks include but are not limited to bleeding, infection, vascular injury, stroke, myocardial infection, arrhythmia, kidney injury, radiation-related injury in the case of prolonged fluoroscopy use, emergency cardiac surgery, and death. The patient understands the risks of serious complication is 1-2 in 9390 with diagnostic cardiac cath and 1-2% or less with angioplasty/stenting.  I have also recommended CT studies to include a gated cardiac CTA and a CTA of the  chest, abdomen, and pelvis to evaluate aortic valve anatomy further and its relationship to the coronary arteries.  This will also better evaluate the patient's aorta in the setting of bicuspid aortic valve disease.  She understands that after her studies are completed, she will be referred for a formal cardiac surgical consultation as part of a multidisciplinary team approach to her care.  COVID-19 Education: The signs and symptoms of COVID-19 were discussed with the patient and how to seek care for testing (follow up with PCP or arrange E-visit).  The importance of social distancing was discussed today.  Time:   Today, I have spent 60 minutes with the patient with telehealth technology discussing the above problems.     Medication Adjustments/Labs and Tests Ordered: Current medicines are reviewed at length with the patient today.  Concerns regarding medicines are outlined above.   Tests Ordered: No orders of the defined types were placed in this encounter.   Medication Changes: No orders of the defined types were placed in this encounter.   Disposition:  Follow up as outlined  Signed, Sherren Mocha, MD  06/06/2018 4:16 PM    [facility]

## 2018-06-09 ENCOUNTER — Encounter: Payer: Self-pay | Admitting: Family Medicine

## 2018-06-09 ENCOUNTER — Ambulatory Visit (INDEPENDENT_AMBULATORY_CARE_PROVIDER_SITE_OTHER): Payer: Medicare Other

## 2018-06-09 ENCOUNTER — Other Ambulatory Visit: Payer: Self-pay

## 2018-06-09 ENCOUNTER — Ambulatory Visit (INDEPENDENT_AMBULATORY_CARE_PROVIDER_SITE_OTHER): Payer: Medicare Other | Admitting: Family Medicine

## 2018-06-09 ENCOUNTER — Telehealth: Payer: Self-pay | Admitting: Family Medicine

## 2018-06-09 DIAGNOSIS — R7303 Prediabetes: Secondary | ICD-10-CM | POA: Diagnosis not present

## 2018-06-09 DIAGNOSIS — Z0000 Encounter for general adult medical examination without abnormal findings: Secondary | ICD-10-CM | POA: Diagnosis not present

## 2018-06-09 DIAGNOSIS — K219 Gastro-esophageal reflux disease without esophagitis: Secondary | ICD-10-CM

## 2018-06-09 DIAGNOSIS — M7542 Impingement syndrome of left shoulder: Secondary | ICD-10-CM | POA: Diagnosis not present

## 2018-06-09 DIAGNOSIS — I350 Nonrheumatic aortic (valve) stenosis: Secondary | ICD-10-CM

## 2018-06-09 DIAGNOSIS — E782 Mixed hyperlipidemia: Secondary | ICD-10-CM | POA: Diagnosis not present

## 2018-06-09 DIAGNOSIS — Z720 Tobacco use: Secondary | ICD-10-CM

## 2018-06-09 NOTE — Patient Instructions (Addendum)
Ms. Uriarte , Thank you for taking time to come for your Medicare Wellness Visit. I appreciate your ongoing commitment to your health goals. Please review the following plan we discussed and let me know if I can assist you in the future.   These are the goals we discussed: Goals      Patient Stated   . DIET - REDUCE PORTION SIZE (pt-stated)     Monitor sugar intake  Weight goal at 175lb    . Increase physical activity (pt-stated)     Silver sneaker program or walking once shoulder starts to feel better.        This is a list of the screening recommended for you and due dates:  Health Maintenance  Topic Date Due  . Flu Shot  08/26/2018  . Cologuard (Stool DNA test)  04/28/2019  . Mammogram  06/02/2019  . Tetanus Vaccine  05/07/2026  . DEXA scan (bone density measurement)  Completed  .  Hepatitis C: One time screening is recommended by Center for Disease Control  (CDC) for  adults born from [birth year] through 1965.   Completed  . Pneumonia vaccines  Completed

## 2018-06-09 NOTE — Progress Notes (Addendum)
Subjective:   Donna Rios is a 68 y.o. female who presents for Medicare Annual (Subsequent) preventive examination.  Review of Systems:  No ROS.  Medicare Wellness Virtual Visit.  Visual/audio telehealth visit, UTA vital signs.   See social history for additional risk factors.   Cardiac Risk Factors include: advanced age (>61men, >12 women)     Objective:     Vitals: There were no vitals taken for this visit.  There is no height or weight on file to calculate BMI.  Advanced Directives 06/09/2018 09/29/2017 06/07/2017  Does Patient Have a Medical Advance Directive? No No No  Would patient like information on creating a medical advance directive? Yes (MAU/Ambulatory/Procedural Areas - Information given) - Yes (MAU/Ambulatory/Procedural Areas - Information given)    Tobacco Social History   Tobacco Use  Smoking Status Current Every Day Smoker  . Packs/day: 0.50  . Years: 46.00  . Pack years: 23.00  . Types: Cigarettes  Smokeless Tobacco Never Used     Ready to quit: Not Answered Counseling given: Not Answered   Clinical Intake:  Pre-visit preparation completed: Yes        Diabetes: No  How often do you need to have someone help you when you read instructions, pamphlets, or other written materials from your doctor or pharmacy?: 1 - Never  Interpreter Needed?: No     Past Medical History:  Diagnosis Date  . Allergic rhinitis   . Arthritis   . Heart murmur   . Hypertension   . Phlebitis   . RUQ pain 06/21/2017   From the [place] Hepato w Eject Fract order  . Stress incontinence    Past Surgical History:  Procedure Laterality Date  . BREAST BIOPSY Left    ducts removed  . Cataract surgery    . COLONOSCOPY WITH PROPOFOL N/A 09/29/2017   Procedure: COLONOSCOPY WITH PROPOFOL;  Surgeon: Lin Landsman, MD;  Location: [facility] ENDOSCOPY;  Service: Gastroenterology;  Laterality: N/A;  . EYE SURGERY    . FOOT SURGERY     7  . KNEE ARTHROSCOPY Right   .  LAPAROSCOPIC HYSTERECTOMY    . VARICOSE VEIN SURGERY     Family History  Problem Relation Age of Onset  . Alcoholism Other   . Arthritis Other   . Lung cancer Other   . Heart disease Other   . Stroke Other   . Hypertension Other   . Diabetes Other   . Heart failure Mother   . Hypertension Mother   . Diabetes Mother   . Heart failure Father   . Hypotension Father   . Breast cancer Neg Hx    Social History   Socioeconomic History  . Marital status: Married    Spouse name: Not on file  . Number of children: Not on file  . Years of education: Not on file  . Highest education level: Not on file  Occupational History  . Not on file  Social Needs  . Financial resource strain: Not hard at all  . Food insecurity:    Worry: Never true    Inability: Never true  . Transportation needs:    Medical: No    Non-medical: No  Tobacco Use  . Smoking status: Current Every Day Smoker    Packs/day: 0.50    Years: 46.00    Pack years: 23.00    Types: Cigarettes  . Smokeless tobacco: Never Used  Substance and Sexual Activity  . Alcohol use: Not Currently  Alcohol/week: 0.0 standard drinks  . Drug use: No  . Sexual activity: Not on file  Lifestyle  . Physical activity:    Days per week: 0 days    Minutes per session: Not on file  . Stress: Not on file  Relationships  . Social connections:    Talks on phone: Not on file    Gets together: Not on file    Attends religious service: Not on file    Active member of club or organization: Not on file    Attends meetings of clubs or organizations: Not on file    Relationship status: Not on file  Other Topics Concern  . Not on file  Social History Narrative  . Not on file    Outpatient Encounter Medications as of 06/09/2018  Medication Sig  . acetaminophen (TYLENOL) 650 MG CR tablet Take 650 mg by mouth as needed for fever.   . APPLE CIDER VINEGAR PO Take 1 tablet by mouth daily as needed.  Marland Kitchen b complex vitamins tablet Take 1  tablet by mouth every other day.  . Calcium Carbonate-Vitamin D (CALCIUM 500 + D) 500-125 MG-UNIT TABS Take by mouth.  . cetirizine (ZYRTEC) 10 MG tablet Take 10 mg by mouth daily.  . cholecalciferol (VITAMIN D) 400 units TABS tablet Take 2,000 Units by mouth.  . fluticasone (FLONASE) 50 MCG/ACT nasal spray Place 1 spray into both nostrils daily as needed.   Marland Kitchen ibuprofen (ADVIL,MOTRIN) 200 MG tablet Take 200 mg by mouth every 8 (eight) hours as needed.  . Menthol, Topical Analgesic, (ICY HOT EX) Apply topically as needed (muscle pain).  . Multiple Vitamins-Minerals (COMPLETE WOMENS PO) Take 1 tablet by mouth daily.  . naphazoline-glycerin (CLEAR EYES) 0.012-0.2 % SOLN Place 1 drop into both eyes as needed.   . Probiotic Product (PROBIOTIC ADVANCED PO) Take 1 tablet by mouth as needed.   Marland Kitchen Propylene Glycol (SYSTANE COMPLETE OP) Apply 1 drop to eye as needed.  . rosuvastatin (CRESTOR) 40 MG tablet Take 1 tablet (40 mg total) by mouth daily.  . sodium chloride (OCEAN) 0.65 % SOLN nasal spray Place 1 spray into both nostrils as needed for congestion.   No facility-administered encounter medications on file as of 06/09/2018.     Activities of Daily Living In your present state of health, do you have any difficulty performing the following activities: 06/09/2018  Hearing? N  Vision? N  Difficulty concentrating or making decisions? N  Walking or climbing stairs? N  Dressing or bathing? N  Doing errands, shopping? N  Preparing Food and eating ? N  Using the Toilet? N  In the past six months, have you accidently leaked urine? Y  Comment Managed with daily liner  Do you have problems with loss of bowel control? N  Managing your Medications? N  Managing your Finances? N  Housekeeping or managing your Housekeeping? N  Some recent data might be hidden    Patient Care Team: [name], MD as PCP - General (Family Medicine)    Assessment:   This is a routine wellness examination for  [name].  I connected with patient 06/15/18 at  9:00 AM EDT by a video/audio enabled telemedicine application and verified that I am speaking with the correct person using two identifiers. Patient stated full name and DOB. Patient gave permission to continue with virtual visit. Patient's location was at home and Nurse's location was at [place] office.   Health Screenings  Mammogram - 05/2017 Colonoscopy - 09/2017  Bone Density - 07/2017 Glaucoma -none Hearing -demonstrates normal hearing during visit. Hemoglobin A1C - 04/2017 Lipid panel -01/2018 LDL cholesterol direct 02/2018 Dental- UTD Vision- visits within the last 12 months.  Social  Alcohol intake - no        Smoking history- current and thinks about quitting from time to time. Not is ready today. Smokers in home? self Illicit drug use? none Exercise -none  Diet -regular BMI- discussed the importance of a healthy diet, water intake and the benefits of aerobic exercise.  Educational material provided.   Safety  Patient feels safe at home- yes Patient does have smoke detectors at home- yes Patient does wear sunscreen or protective clothing when in direct sunlight -yes Patient does wear seat belt when in a moving vehicle -yes  Covid-19 precautions and sickness symptoms discussed.   Activities of Daily Living Patient denies needing assistance with: driving, household chores, feeding themselves, getting from bed to chair, getting to the toilet, bathing/showering, dressing, managing money, or preparing meals.  No new identified risk were noted.    Depression Screen Patient denies losing interest in daily life, feeling hopeless, or crying easily over simple problems.   Medication-taking as directed and without issues.   Fall Screen Patient denies falls since the last reported to her primary care provider almost 1 year ago.    Memory Screen Patient is alert.  Patient denies difficulty focusing, concentrating or misplacing items.  Correctly identified the president of the [place] , season and recall. Patient likes to read before bed, and play computer games up to 60 minutes daily for brain stimulation.  Immunizations The following Immunizations were discussed: Influenza, shingles, pneumonia, and tetanus. Patient plans to receive the shingles vaccine through her local pharmacy.  Other Providers Patient Care Team: [name], MD as PCP - General (Family Medicine)  Exercise Activities and Dietary recommendations Current Exercise Habits: The patient does not participate in regular exercise at present  Goals      Patient Stated   . DIET - REDUCE PORTION SIZE (pt-stated)     Monitor sugar intake  Weight goal at 175lb    . Increase physical activity (pt-stated)     Silver sneaker program or walking once shoulder starts to feel better.        Fall Risk Fall Risk  06/07/2017 05/06/2016  Falls in the past year? Yes No  Number falls in past yr: 1 -  Injury with Fall? No -  Comment She slipped on the ice.  Bruise only.  She did not seek medical attention.  -  Follow up Education provided -   Depression Screen PHQ 2/9 Scores 06/07/2017 05/06/2016  PHQ - 2 Score 0 0     Cognitive Function MMSE - Mini Mental State Exam 06/07/2017 05/06/2016  Orientation to time 5 5  Orientation to Place 5 5  Registration 3 3  Attention/ Calculation 5 5  Recall 3 2  Language- name 2 objects 2 2  Language- repeat 1 1  Language- follow 3 step command 3 3  Language- read & follow direction 1 1  Write a sentence 1 1  Copy design 1 1  Total score 30 29     6CIT Screen 06/09/2018  What Year? 0 points  What month? 0 points  What time? 0 points  Count back from 20 0 points  Months in reverse 0 points  Repeat phrase 0 points  Total Score 0    Immunization History  Administered Date(s) Administered  .  Influenza, High Dose Seasonal PF 11/06/2015, 10/06/2016  . Influenza-Unspecified 10/05/2017  . Pneumococcal  Conjugate-13 05/06/2016  . Pneumococcal Polysaccharide-23 06/07/2017  . Tdap 05/06/2016   Screening Tests Health Maintenance  Topic Date Due  . INFLUENZA VACCINE  08/26/2018  . Fecal DNA (Cologuard)  04/28/2019  . MAMMOGRAM  06/02/2019  . TETANUS/TDAP  05/07/2026  . DEXA SCAN  Completed  . Hepatitis C Screening  Completed  . PNA vac Low Risk Adult  Completed      Plan:    End of life planning; Advance aging; Advanced directives discussed.  Copy of current HCPOA/Living Will requested upon completion.    I have personally reviewed and noted the following in the patient's chart:   . Medical and social history . Use of alcohol, tobacco or illicit drugs  . Current medications and supplements . Functional ability and status . Nutritional status . Physical activity . Advanced directives . List of other physicians . Hospitalizations, surgeries, and ER visits in previous 12 months . Vitals . Screenings to include cognitive, depression, and falls . Referrals and appointments  In addition, I have reviewed and discussed with patient certain preventive protocols, quality metrics, and best practice recommendations. A written personalized care plan for preventive services as well as general preventive health recommendations were provided to patient.     Varney Biles, LPN  [id number]/61/[id number]

## 2018-06-09 NOTE — Telephone Encounter (Signed)
Pt scheduled for in person ov on 06/12/18 @ 8:30 am for shoulder injection as requested by PCP.

## 2018-06-09 NOTE — Telephone Encounter (Addendum)
Please call the patient and get her set up for an in office visit with me on 06/12/18 for a shoulder injection. If this could be placed in to the 8:30 time slot that would be great.

## 2018-06-09 NOTE — Progress Notes (Signed)
Virtual Visit via video Note  This visit type was conducted due to national recommendations for restrictions regarding the COVID-19 pandemic (e.g. social distancing).  This format is felt to be most appropriate for this patient at this time.  All issues noted in this document were discussed and addressed.  No physical exam was performed (except for noted visual exam findings with Video Visits).   I connected with Donna Rios today at  8:30 AM EDT by a video enabled telemedicine application and verified that I am speaking with the correct person using two identifiers. Location patient: home Location provider: work Persons participating in the virtual visit: patient, provider  I discussed the limitations, risks, security and privacy concerns of performing an evaluation and management service by telephone and the availability of in person appointments. I also discussed with the patient that there may be a patient responsible charge related to this service. The patient expressed understanding and agreed to proceed.   Reason for visit: Follow-up  HPI: Left shoulder pain: Patient notes she had this back in the fall and did some exercises which were beneficial though 4 weeks ago when she was weeding with a weedeater she developed discomfort in the shoulder after that.  Notes it is in the front and back of her shoulder.  Hurts with abduction and internal and external rotation.  She tries not to sleep on it.  She is taking ibuprofen though not every day.  Feels a popping sensation.  Occasional left-sided neck pain with this.  Hyperlipidemia: Taking Crestor.  No chest pressure.  No shortness of breath.  No right upper quadrant pain.  No myalgias.  GERD: Patient notes very rare indigestion with some discomfort after eating fatty foods.  She will belch and this will go away.  Describes the discomfort as an aching.  Occurs 1 to 2 hours after eating.  No exertional symptoms.  Prediabetes: Does note some  polydipsia and nocturia 3 times nightly.  Does note it has been worse last couple of weeks.  Aortic stenosis: She did see cardiology.  They are going to set her up for a catheterization and imaging in preparation for repair.  Tobacco abuse: Patient notes she needs to quit though she is unsure if she is ready.  She is smoking three quarters of a pack a day.   ROS: See pertinent positives and negatives per HPI.  Past Medical History:  Diagnosis Date  . Allergic rhinitis   . Arthritis   . Heart murmur   . Hypertension   . Phlebitis   . RUQ pain 06/21/2017   From the [place] Hepato w Eject Fract order  . Stress incontinence     Past Surgical History:  Procedure Laterality Date  . BREAST BIOPSY Left    ducts removed  . Cataract surgery    . COLONOSCOPY WITH PROPOFOL N/A 09/29/2017   Procedure: COLONOSCOPY WITH PROPOFOL;  Surgeon: Lin Landsman, MD;  Location: [facility] ENDOSCOPY;  Service: Gastroenterology;  Laterality: N/A;  . EYE SURGERY    . FOOT SURGERY     7  . KNEE ARTHROSCOPY Right   . LAPAROSCOPIC HYSTERECTOMY    . VARICOSE VEIN SURGERY      Family History  Problem Relation Age of Onset  . Alcoholism Other   . Arthritis Other   . Lung cancer Other   . Heart disease Other   . Stroke Other   . Hypertension Other   . Diabetes Other   . Heart failure Mother   .  Hypertension Mother   . Diabetes Mother   . Heart failure Father   . Hypotension Father   . Breast cancer Neg Hx     SOCIAL HX: Smoker   Current Outpatient Medications:  .  acetaminophen (TYLENOL) 650 MG CR tablet, Take 650 mg by mouth every 8 (eight) hours as needed for fever. , Disp: , Rfl:  .  APPLE CIDER VINEGAR PO, Take 1 tablet by mouth daily as needed (with fatty foods). , Disp: , Rfl:  .  b complex vitamins tablet, Take 1 tablet by mouth every other day., Disp: , Rfl:  .  Calcium Carbonate-Vitamin D (CALCIUM 500 + D) 500-125 MG-UNIT TABS, Take 1 tablet by mouth daily. , Disp: , Rfl:  .  cetirizine  (ZYRTEC) 10 MG tablet, Take 10 mg by mouth daily at 8 pm. , Disp: , Rfl:  .  fluticasone (FLONASE) 50 MCG/ACT nasal spray, Place 1 spray into both nostrils daily as needed for allergies. , Disp: , Rfl:  .  ibuprofen (ADVIL,MOTRIN) 200 MG tablet, Take 400 mg by mouth at bedtime as needed (for pain). , Disp: , Rfl:  .  Menthol, Topical Analgesic, (ICY HOT EX), Apply 1 application topically 4 (four) times daily as needed (muscle pain). , Disp: , Rfl:  .  naphazoline-glycerin (CLEAR EYES) 0.012-0.2 % SOLN, Place 1 drop into both eyes 4 (four) times daily as needed (redness.). , Disp: , Rfl:  .  Probiotic Product (PROBIOTIC ADVANCED PO), Take 1 tablet by mouth daily as needed (for digestive health.). , Disp: , Rfl:  .  Propylene Glycol (SYSTANE COMPLETE OP), Place 1 drop into both eyes 3 (three) times daily as needed (dry/irritated eyes.). , Disp: , Rfl:  .  rosuvastatin (CRESTOR) 40 MG tablet, Take 1 tablet (40 mg total) by mouth daily., Disp: 90 tablet, Rfl: 2 .  sodium chloride (OCEAN) 0.65 % SOLN nasal spray, Place 1 spray into both nostrils 4 (four) times daily as needed for congestion. , Disp: , Rfl:  .  Multiple Vitamin (MULTIVITAMIN WITH MINERALS) TABS tablet, Take 1 tablet by mouth daily. Women's Complete Multivitamin 50+, Disp: , Rfl:  .  nicotine (NICODERM CQ - DOSED IN MG/24 HOURS) 21 mg/24hr patch, Place 10.5 mg onto the skin daily., Disp: , Rfl:   EXAM:  VITALS per patient if applicable: None  GENERAL: alert, oriented, appears well and in no acute distress  HEENT: atraumatic, conjunttiva clear, no obvious abnormalities on inspection of external nose and ears  NECK: normal movements of the head and neck  LUNGS: on inspection no signs of respiratory distress, breathing rate appears normal, no obvious gross SOB, gasping or wheezing  CV: no obvious cyanosis  MS: moves all visible extremities without noticeable abnormality, patient is able to raise her bilateral arms above her head and  placing behind her head and behind her back, she does report some discomfort on all ranges of motion in the left shoulder  PSYCH/NEURO: pleasant and cooperative, no obvious depression or anxiety, speech and thought processing grossly intact  ASSESSMENT AND PLAN:  Discussed the following assessment and plan:  Rotator cuff impingement syndrome of left shoulder  Severe aortic valve stenosis  Prediabetes  Tobacco abuse  Mixed hyperlipidemia  Gastroesophageal reflux disease, esophagitis presence not specified  Rotator cuff impingement syndrome of left shoulder Patient will come in for an exam to confirm rotator cuff impingement and then likely an injection.  Severe aortic valve stenosis She will complete her evaluation through cardiology.  Prediabetes Check glucose with lab work.  Tobacco abuse Encouraged tobacco cessation.  Discussed use of patches, gums, or lozenges.  Hyperlipidemia Continue Crestor.  GERD (gastroesophageal reflux disease) Symptoms likely related to GERD.  Discussed avoidance of food triggers.  If persists we could consider medication treatment.  CMA will contact the patient and get her set up for follow-up in 4 months.  Social distancing precautions and sick precautions given regarding COVID-19.   I discussed the assessment and treatment plan with the patient. The patient was provided an opportunity to ask questions and all were answered. The patient agreed with the plan and demonstrated an understanding of the instructions.   The patient was advised to call back or seek an in-person evaluation if the symptoms worsen or if the condition fails to improve as anticipated.   [name], MD

## 2018-06-10 NOTE — Progress Notes (Signed)
I have reviewed the above note and agree.  Horace Wishon, M.D.  

## 2018-06-12 ENCOUNTER — Ambulatory Visit (INDEPENDENT_AMBULATORY_CARE_PROVIDER_SITE_OTHER): Payer: Medicare Other | Admitting: Family Medicine

## 2018-06-12 ENCOUNTER — Encounter: Payer: Self-pay | Admitting: Family Medicine

## 2018-06-12 ENCOUNTER — Other Ambulatory Visit
Admission: RE | Admit: 2018-06-12 | Discharge: 2018-06-12 | Disposition: A | Discharge status: Home / Self Care | Payer: Medicare Other | Source: Ambulatory Visit | Attending: Cardiovascular Disease | Admitting: Cardiovascular Disease

## 2018-06-12 ENCOUNTER — Other Ambulatory Visit: Payer: Self-pay

## 2018-06-12 VITALS — BP 140/82 | HR 80 | Temp 98.3°F | Ht 68.0 in | Wt 218.6 lb

## 2018-06-12 DIAGNOSIS — Z01812 Encounter for preprocedural laboratory examination: Secondary | ICD-10-CM | POA: Diagnosis not present

## 2018-06-12 DIAGNOSIS — I350 Nonrheumatic aortic (valve) stenosis: Secondary | ICD-10-CM

## 2018-06-12 DIAGNOSIS — Z1159 Encounter for screening for other viral diseases: Secondary | ICD-10-CM | POA: Diagnosis not present

## 2018-06-12 DIAGNOSIS — R030 Elevated blood-pressure reading, without diagnosis of hypertension: Secondary | ICD-10-CM

## 2018-06-12 DIAGNOSIS — M7542 Impingement syndrome of left shoulder: Secondary | ICD-10-CM

## 2018-06-12 LAB — CBC
HCT: 37 % (ref 36.0–46.0)
Hemoglobin: 13 g/dL (ref 12.0–15.0)
MCHC: 35.1 g/dL (ref 30.0–36.0)
MCV: 92.3 fl (ref 78.0–100.0)
Platelets: 229 10*3/uL (ref 150.0–400.0)
RBC: 4.01 Mil/uL (ref 3.87–5.11)
RDW: 13.6 % (ref 11.5–15.5)
WBC: 10.1 10*3/uL (ref 4.0–10.5)

## 2018-06-12 LAB — BASIC METABOLIC PANEL
BUN: 12 mg/dL (ref 6–23)
CO2: 28 mEq/L (ref 19–32)
Calcium: 9.2 mg/dL (ref 8.4–10.5)
Chloride: 103 mEq/L (ref 96–112)
Creatinine, Ser: 0.71 mg/dL (ref 0.40–1.20)
GFR: 81.91 mL/min (ref >60.00)
Glucose, Bld: 106 mg/dL — ABNORMAL HIGH (ref 70–99)
Potassium: 4.5 mEq/L (ref 3.5–5.1)
Sodium: 139 mEq/L (ref 135–145)

## 2018-06-12 MED ORDER — LIDOCAINE HCL 1 % IJ SOLN
4.0000 mL | Freq: Once | INTRAMUSCULAR | Status: AC
  Administered 2018-06-12: 4 mL

## 2018-06-12 MED ORDER — METHYLPREDNISOLONE ACETATE 40 MG/ML IJ SUSP
40.0000 mg | Freq: Once | INTRAMUSCULAR | Status: AC
  Administered 2018-06-12: 40 mg via INTRA_ARTICULAR

## 2018-06-12 NOTE — Addendum Note (Signed)
Addended by: Nanci Pina on: 06/12/2018 04:15 PM   Modules accepted: Orders

## 2018-06-12 NOTE — Assessment & Plan Note (Signed)
See procedure note below.

## 2018-06-12 NOTE — Progress Notes (Signed)
Started 4 weeks ago injured possable while weed eating her yard. Painful with curtain ROM and difficult to get dress or undress. Pt stated that some ROM she can hear a popping like sound. Pt has been taking ibuprofen to help with her pain.

## 2018-06-12 NOTE — Progress Notes (Signed)
[name], MD Phone: [phone]  Donna Rios is a 68 y.o. female who presents today for follow-up.  Left shoulder pain: unchanged from evaluation last week. Continues to bother her with internal rotation and abduction. Patient notes she has to hold her arm at a certain angle at her side to help it feel better.   History of elevated BP: reports her BPs have been 107-148/69-89. Only 2 BPs in the 140s/80s over the past week. She needs labs today in preparation for her heart cath that is for evaluation for treatment of aortic stenosis. She is having her cath on Thursday.   Social History   Tobacco Use  Smoking Status Former Smoker   Packs/day: 0.50   Years: 46.00   Pack years: 23.00   Types: Cigarettes  Smokeless Tobacco Never Used  Tobacco Comment   stopped 06/11/2018     ROS see history of present illness  Objective  Physical Exam Vitals:   06/12/18 0833  BP: 140/82  Pulse: 80  Temp: 98.3 F (36.8 C)  SpO2: 96%    BP Readings from Last 3 Encounters:  06/12/18 140/82  06/09/18 107/69  06/06/18 (!) 144/84   Wt Readings from Last 3 Encounters:  06/12/18 218 lb 9.6 oz (99.2 kg)  06/09/18 213 lb 9.6 oz (96.9 kg)  06/06/18 216 lb 3.2 oz (98.1 kg)    Physical Exam Constitutional:      General: She is not in acute distress.    Appearance: She is not ill-appearing.  Musculoskeletal:     Comments: Bilateral shoulders are symmetric with no tenderness, there is full range of motion bilateral shoulders, the patient has discomfort on active internal rotation and abduction of the left shoulder, no discomfort on active external rotation of the left shoulder, there is discomfort on passive internal rotation of the left shoulder, there is mild discomfort on passive abduction of the left shoulder, there is no discomfort on active or passive range of motion of the right shoulder, positive empty can on the left, negative empty can on the right, negative speeds  bilaterally, 5/5 strength bilateral biceps, triceps, and grip, sensation to light touch intact bilateral upper extremities, both hands are warm and well perfused  Neurological:     Mental Status: She is alert.      Assessment/Plan: Please see individual problem list.  Rotator cuff impingement syndrome of left shoulder See procedure note below.  Elevated BP without diagnosis of hypertension Mostly well controlled.  She will continue to monitor and follow with cardiology.    Severe aortic valve stenosis Preprocedure labs ordered.  She will have these completed today.   Orders Placed This Encounter  Procedures   Basic Metabolic Panel (BMET)   CBC    No orders of the defined types were placed in this encounter.  Subacromial Shoulder Injection Procedure Note  Pre-operative Diagnosis: left shoulder impingemnet syndrome  Post-operative Diagnosis: same  Indications: symptom relief, diagnostis  Anesthesia: topical numbning spray  Procedure Details   Written consent was obtained for the procedure. The shoulder was prepped with Betadine and the skin was anesthetized. A 22 gauge needle was advanced into the subacromial space through posterior approach without difficulty  The space was then injected with 4 ml 1% lidocaine and 1 ml of depomedrol 40 mg/mL. The injection site was cleansed with isopropyl alcohol and a dressing was applied.  Complications:  None; patient tolerated the procedure well.  Patient was advised of reasons to seek medical attention relating to her  shoulder injection.  [name], MD [place]

## 2018-06-12 NOTE — Assessment & Plan Note (Signed)
Preprocedure labs ordered.  She will have these completed today.

## 2018-06-12 NOTE — Assessment & Plan Note (Signed)
Mostly well controlled.  She will continue to monitor and follow with cardiology.

## 2018-06-12 NOTE — Patient Instructions (Signed)
Nice to see you. We did an injection today.  If you develop swelling, fever, redness, increased pain, or any new symptoms please be evaluated.

## 2018-06-13 ENCOUNTER — Telehealth: Payer: Self-pay | Admitting: *Deleted

## 2018-06-13 DIAGNOSIS — K219 Gastro-esophageal reflux disease without esophagitis: Secondary | ICD-10-CM | POA: Insufficient documentation

## 2018-06-13 LAB — NOVEL CORONAVIRUS, NAA (HOSP ORDER, SEND-OUT TO REF LAB; TAT 18-24 HRS): SARS-CoV-2, NAA: NOT DETECTED

## 2018-06-13 NOTE — Assessment & Plan Note (Signed)
Symptoms likely related to GERD.  Discussed avoidance of food triggers.  If persists we could consider medication treatment.

## 2018-06-13 NOTE — Assessment & Plan Note (Signed)
She will complete her evaluation through cardiology.

## 2018-06-13 NOTE — Assessment & Plan Note (Addendum)
Encouraged tobacco cessation.  Discussed use of patches, gums, or lozenges.

## 2018-06-13 NOTE — Assessment & Plan Note (Signed)
Check glucose with lab work.

## 2018-06-13 NOTE — Assessment & Plan Note (Signed)
Continue Crestor 

## 2018-06-13 NOTE — Assessment & Plan Note (Signed)
Patient will come in for an exam to confirm rotator cuff impingement and then likely an injection.

## 2018-06-13 NOTE — Telephone Encounter (Addendum)
Pt contacted pre-catheterization scheduled at [facility] for: Thursday Jun 15, 2018 1:30 PM Verified arrival time and place: [place] Entrance A at: 11:30 AM  At this time, all patients are to wear a mask in the hospital. If you do not have one on arrival, you will be given one.   Covid-19 test date: 06/12/18 not detected  No solid food after midnight prior to cath, clear liquids until 5 AM day of procedure. Contrast allergy: no   AM meds can be  taken pre-cath with sip of water including: ASA 81 mg-pt states no allergic reaction to aspirin,  hx of GI upset with aspirin in the past, okay with taking morning of procedure.  Confirmed patient has responsible person to drive home post procedure and observe 24 hours after arriving home: yes  Visitors are not allowed in the hospital. You will be dropped off and picked up at [place]. Your designated party will be called after your procedure with an update and discharge information.       COVID-19 Pre-Screening Questions:  . In the past 7 to 10 days have you had a cough,  shortness of breath, headache, congestion, fever, body aches, chills, sore throat, or sudden loss of taste or sense of smell? Chronic cough and left shoulder pain, no new symptoms in past 7-10 days . Have you been around anyone with known Covid 19? no . Have you been around anyone who is awaiting Covid 19 test results in the past 7 to 10 days? no . Have you been around anyone who has been exposed to Covid 19, or has mentioned symptoms of Covid 19 within the past 7 to 10 days? no  I reviewed procedure instructions/Covid 19 screening questions with patient.

## 2018-06-14 ENCOUNTER — Other Ambulatory Visit: Payer: Self-pay

## 2018-06-14 DIAGNOSIS — R0609 Other forms of dyspnea: Secondary | ICD-10-CM

## 2018-06-14 DIAGNOSIS — I350 Nonrheumatic aortic (valve) stenosis: Secondary | ICD-10-CM

## 2018-06-15 ENCOUNTER — Ambulatory Visit (HOSPITAL_COMMUNITY)
Admission: RE | Admit: 2018-06-15 | Discharge: 2018-06-15 | Disposition: A | Discharge status: Home / Self Care | Payer: Medicare Other | Attending: Cardiovascular Disease | Admitting: Cardiovascular Disease

## 2018-06-15 ENCOUNTER — Other Ambulatory Visit: Payer: Self-pay

## 2018-06-15 ENCOUNTER — Encounter (HOSPITAL_COMMUNITY)
Admission: RE | Disposition: A | Discharge status: Home / Self Care | Payer: Medicare Other | Source: Home / Self Care | Attending: Cardiovascular Disease

## 2018-06-15 DIAGNOSIS — I350 Nonrheumatic aortic (valve) stenosis: Secondary | ICD-10-CM | POA: Diagnosis not present

## 2018-06-15 DIAGNOSIS — M1990 Unspecified osteoarthritis, unspecified site: Secondary | ICD-10-CM | POA: Diagnosis not present

## 2018-06-15 DIAGNOSIS — Z7951 Long term (current) use of inhaled steroids: Secondary | ICD-10-CM | POA: Insufficient documentation

## 2018-06-15 DIAGNOSIS — I10 Essential (primary) hypertension: Secondary | ICD-10-CM | POA: Insufficient documentation

## 2018-06-15 DIAGNOSIS — Z79899 Other long term (current) drug therapy: Secondary | ICD-10-CM | POA: Diagnosis not present

## 2018-06-15 DIAGNOSIS — F17210 Nicotine dependence, cigarettes, uncomplicated: Secondary | ICD-10-CM | POA: Diagnosis not present

## 2018-06-15 DIAGNOSIS — Z791 Long term (current) use of non-steroidal anti-inflammatories (NSAID): Secondary | ICD-10-CM | POA: Diagnosis not present

## 2018-06-15 DIAGNOSIS — Z886 Allergy status to analgesic agent status: Secondary | ICD-10-CM | POA: Diagnosis not present

## 2018-06-15 DIAGNOSIS — Z8249 Family history of ischemic heart disease and other diseases of the circulatory system: Secondary | ICD-10-CM | POA: Diagnosis not present

## 2018-06-15 HISTORY — PX: RIGHT HEART CATH AND CORONARY ANGIOGRAPHY: CATH118264

## 2018-06-15 LAB — POCT I-STAT EG7
Acid-base deficit: 1 mmol/L (ref 0.0–2.0)
Acid-base deficit: 2 mmol/L (ref 0.0–2.0)
Bicarbonate: 23.7 mmol/L (ref 20.0–28.0)
Bicarbonate: 25.2 mmol/L (ref 20.0–28.0)
Calcium, Ion: 0.98 mmol/L — ABNORMAL LOW (ref 1.15–1.40)
Calcium, Ion: 1.14 mmol/L — ABNORMAL LOW (ref 1.15–1.40)
HCT: 29 % — ABNORMAL LOW (ref 36.0–46.0)
HCT: 31 % — ABNORMAL LOW (ref 36.0–46.0)
Hemoglobin: 10.5 g/dL — ABNORMAL LOW (ref 12.0–15.0)
Hemoglobin: 9.9 g/dL — ABNORMAL LOW (ref 12.0–15.0)
O2 Saturation: 80 %
O2 Saturation: 84 %
Potassium: 3.2 mmol/L — ABNORMAL LOW (ref 3.5–5.1)
Potassium: 3.6 mmol/L (ref 3.5–5.1)
Sodium: 144 mmol/L (ref 135–145)
Sodium: 146 mmol/L — ABNORMAL HIGH (ref 135–145)
TCO2: 25 mmol/L (ref 22–32)
TCO2: 27 mmol/L (ref 22–32)
pCO2, Ven: 43.1 mmHg — ABNORMAL LOW (ref 44.0–60.0)
pCO2, Ven: 47.3 mmHg (ref 44.0–60.0)
pH, Ven: 7.334 (ref 7.250–7.430)
pH, Ven: 7.349 (ref 7.250–7.430)
pO2, Ven: 47 mmHg — ABNORMAL HIGH (ref 32.0–45.0)
pO2, Ven: 52 mmHg — ABNORMAL HIGH (ref 32.0–45.0)

## 2018-06-15 SURGERY — RIGHT HEART CATH AND CORONARY ANGIOGRAPHY
Anesthesia: LOCAL

## 2018-06-15 MED ORDER — LIDOCAINE HCL (PF) 1 % IJ SOLN
INTRAMUSCULAR | Status: DC | PRN
  Administered 2018-06-15 (×2): 2 mL

## 2018-06-15 MED ORDER — SODIUM CHLORIDE 0.9% FLUSH: 3.0000 mL | Freq: Two times a day (BID) | INTRAVENOUS | Status: DC

## 2018-06-15 MED ORDER — SODIUM CHLORIDE 0.9 % IV SOLN: 250.0000 mL | INTRAVENOUS | Status: DC | PRN

## 2018-06-15 MED ORDER — SODIUM CHLORIDE 0.9 % WEIGHT BASED INFUSION: 1.0000 mL/kg/h | INTRAVENOUS | Status: DC

## 2018-06-15 MED ORDER — SODIUM CHLORIDE 0.9 % WEIGHT BASED INFUSION
3.0000 mL/kg/h | INTRAVENOUS | Status: AC
  Administered 2018-06-15: 3 mL/kg/h via INTRAVENOUS

## 2018-06-15 MED ORDER — FENTANYL CITRATE (PF) 100 MCG/2ML IJ SOLN
INTRAMUSCULAR | Status: DC | PRN
  Administered 2018-06-15: 25 ug via INTRAVENOUS

## 2018-06-15 MED ORDER — HEPARIN (PORCINE) IN NACL 1000-0.9 UT/500ML-% IV SOLN
INTRAVENOUS | Status: DC | PRN
  Administered 2018-06-15 (×2): 500 mL

## 2018-06-15 MED ORDER — HEPARIN SODIUM (PORCINE) 1000 UNIT/ML IJ SOLN
INTRAMUSCULAR | Status: AC
  Filled 2018-06-15: qty 1

## 2018-06-15 MED ORDER — SODIUM CHLORIDE 0.9% FLUSH: 3.0000 mL | INTRAVENOUS | Status: DC | PRN

## 2018-06-15 MED ORDER — FENTANYL CITRATE (PF) 100 MCG/2ML IJ SOLN
INTRAMUSCULAR | Status: AC
  Filled 2018-06-15: qty 2

## 2018-06-15 MED ORDER — MIDAZOLAM HCL 2 MG/2ML IJ SOLN
INTRAMUSCULAR | Status: AC
  Filled 2018-06-15: qty 2

## 2018-06-15 MED ORDER — LABETALOL HCL 5 MG/ML IV SOLN: 10.0000 mg | INTRAVENOUS | Status: DC | PRN

## 2018-06-15 MED ORDER — ASPIRIN 81 MG PO CHEW: 81.0000 mg | CHEWABLE_TABLET | ORAL | Status: DC

## 2018-06-15 MED ORDER — ACETAMINOPHEN 325 MG PO TABS: 650.0000 mg | ORAL_TABLET | ORAL | Status: DC | PRN

## 2018-06-15 MED ORDER — MIDAZOLAM HCL 2 MG/2ML IJ SOLN
INTRAMUSCULAR | Status: DC | PRN
  Administered 2018-06-15: 2 mg via INTRAVENOUS

## 2018-06-15 MED ORDER — LIDOCAINE HCL (PF) 1 % IJ SOLN
INTRAMUSCULAR | Status: AC
  Filled 2018-06-15: qty 30

## 2018-06-15 MED ORDER — IOHEXOL 350 MG/ML SOLN
INTRAVENOUS | Status: DC | PRN
  Administered 2018-06-15: 55 mL via INTRA_ARTERIAL

## 2018-06-15 MED ORDER — VERAPAMIL HCL 2.5 MG/ML IV SOLN
INTRAVENOUS | Status: DC | PRN
  Administered 2018-06-15: 15:00:00 10 mL via INTRA_ARTERIAL

## 2018-06-15 MED ORDER — ONDANSETRON HCL 4 MG/2ML IJ SOLN: 4.0000 mg | Freq: Four times a day (QID) | INTRAMUSCULAR | Status: DC | PRN

## 2018-06-15 MED ORDER — VERAPAMIL HCL 2.5 MG/ML IV SOLN
INTRAVENOUS | Status: AC
  Filled 2018-06-15: qty 2

## 2018-06-15 MED ORDER — HYDRALAZINE HCL 20 MG/ML IJ SOLN: 10.0000 mg | INTRAMUSCULAR | Status: DC | PRN

## 2018-06-15 MED ORDER — HEPARIN SODIUM (PORCINE) 1000 UNIT/ML IJ SOLN
INTRAMUSCULAR | Status: DC | PRN
  Administered 2018-06-15: 5000 [IU] via INTRAVENOUS

## 2018-06-15 MED ORDER — HEPARIN (PORCINE) IN NACL 1000-0.9 UT/500ML-% IV SOLN
INTRAVENOUS | Status: AC
  Filled 2018-06-15: qty 1000

## 2018-06-15 SURGICAL SUPPLY — 12 items
CATH 5FR JL3.5 JR4 ANG PIG MP (CATHETERS) ×1 IMPLANT
CATH BALLN WEDGE 5F 110CM (CATHETERS) ×1 IMPLANT
DEVICE RAD COMP TR BAND LRG (VASCULAR PRODUCTS) ×1 IMPLANT
GLIDESHEATH SLEND SS 6F .021 (SHEATH) ×1 IMPLANT
GUIDEWIRE INQWIRE 1.5J.035X260 (WIRE) IMPLANT
INQWIRE 1.5J .035X260CM (WIRE) ×2
KIT HEART LEFT (KITS) ×2 IMPLANT
PACK CARDIAC CATHETERIZATION (CUSTOM PROCEDURE TRAY) ×2 IMPLANT
SHEATH GLIDE SLENDER 4/5FR (SHEATH) ×1 IMPLANT
TRANSDUCER W/STOPCOCK (MISCELLANEOUS) ×2 IMPLANT
TUBING CIL FLEX 10 FLL-RA (TUBING) ×2 IMPLANT
WIRE HI TORQ VERSACORE-J 145CM (WIRE) ×1 IMPLANT

## 2018-06-15 NOTE — Interval H&P Note (Signed)
History and Physical Interval Note:  06/15/2018 2:18 PM  [place]  has presented today for surgery, with the diagnosis of Aortic stenosis.  The various methods of treatment have been discussed with the patient and family. After consideration of risks, benefits and other options for treatment, the patient has consented to  Procedure(s): RIGHT/LEFT HEART CATH AND CORONARY ANGIOGRAPHY (N/A) as a surgical intervention.  The patient's history has been reviewed, patient examined, no change in status, stable for surgery.  I have reviewed the patient's chart and labs.  Questions were answered to the patient's satisfaction.     Donna Rios

## 2018-06-15 NOTE — Discharge Instructions (Signed)
Radial Site Care ° °This sheet gives you information about how to care for yourself after your procedure. Your health care provider may also give you more specific instructions. If you have problems or questions, contact your health care provider. °What can I expect after the procedure? °After the procedure, it is common to have: °· Bruising and tenderness at the catheter insertion area. °Follow these instructions at home: °Medicines °· Take over-the-counter and prescription medicines only as told by your health care provider. °Insertion site care °· Follow instructions from your health care provider about how to take care of your insertion site. Make sure you: °? Wash your hands with soap and water before you change your bandage (dressing). If soap and water are not available, use hand sanitizer. °? Change your dressing as told by your health care provider. °? Leave stitches (sutures), skin glue, or adhesive strips in place. These skin closures may need to stay in place for 2 weeks or longer. If adhesive strip edges start to loosen and curl up, you may trim the loose edges. Do not remove adhesive strips completely unless your health care provider tells you to do that. °· Check your insertion site every day for signs of infection. Check for: °? Redness, swelling, or pain. °? Fluid or blood. °? Pus or a bad smell. °? Warmth. °· Do not take baths, swim, or use a hot tub until your health care provider approves. °· You may shower 24-48 hours after the procedure, or as directed by your health care provider. °? Remove the dressing and gently wash the site with plain soap and water. °? Pat the area dry with a clean towel. °? Do not rub the site. That could cause bleeding. °· Do not apply powder or lotion to the site. °Activity ° °· For 24 hours after the procedure, or as directed by your health care provider: °? Do not flex or bend the affected arm. °? Do not push or pull heavy objects with the affected arm. °? Do not  drive yourself home from the hospital or clinic. You may drive 24 hours after the procedure unless your health care provider tells you not to. °? Do not operate machinery or power tools. °· Do not lift anything that is heavier than 10 lb (4.5 kg), or the limit that you are told, until your health care provider says that it is safe. °· Ask your health care provider when it is okay to: °? Return to work or school. °? Resume usual physical activities or sports. °? Resume sexual activity. °General instructions °· If the catheter site starts to bleed, raise your arm and put firm pressure on the site. If the bleeding does not stop, get help right away. This is a medical emergency. °· If you went home on the same day as your procedure, a responsible adult should be with you for the first 24 hours after you arrive home. °· Keep all follow-up visits as told by your health care provider. This is important. °Contact a health care provider if: °· You have a fever. °· You have redness, swelling, or yellow drainage around your insertion site. °Get help right away if: °· You have unusual pain at the radial site. °· The catheter insertion area swells very fast. °· The insertion area is bleeding, and the bleeding does not stop when you hold steady pressure on the area. °· Your arm or hand becomes pale, cool, tingly, or numb. °These symptoms may represent a serious problem   that is an emergency. Do not wait to see if the symptoms will go away. Get medical help right away. Call your local emergency services (911 in the [place] Do not drive yourself to the hospital. °Summary °· After the procedure, it is common to have bruising and tenderness at the site. °· Follow instructions from your health care provider about how to take care of your radial site wound. Check the wound every day for signs of infection. °· Do not lift anything that is heavier than 10 lb (4.5 kg), or the limit that you are told, until your health care provider says  that it is safe. °This information is not intended to replace advice given to you by your health care provider. Make sure you discuss any questions you have with your health care provider. °Document Released: 02/13/2010 Document Revised: 02/16/2017 Document Reviewed: 02/16/2017 °Elsevier Interactive Patient Education © 2019 [employer] ° °

## 2018-06-15 NOTE — Progress Notes (Signed)
Report given to Irving Burton

## 2018-06-16 ENCOUNTER — Encounter (HOSPITAL_COMMUNITY): Payer: Self-pay | Admitting: Cardiovascular Disease

## 2018-06-16 ENCOUNTER — Other Ambulatory Visit: Payer: Self-pay

## 2018-06-16 MED ORDER — METOPROLOL TARTRATE 50 MG PO TABS: ORAL_TABLET | ORAL | 0 refills | Status: DC

## 2018-06-21 ENCOUNTER — Ambulatory Visit (HOSPITAL_COMMUNITY)
Admission: RE | Admit: 2018-06-21 | Discharge: 2018-06-21 | Disposition: A | Discharge status: Home / Self Care | Payer: Medicare Other | Source: Ambulatory Visit | Attending: Cardiovascular Disease | Admitting: Cardiovascular Disease

## 2018-06-21 ENCOUNTER — Encounter: Payer: Self-pay | Admitting: Physical Therapy

## 2018-06-21 ENCOUNTER — Ambulatory Visit: Payer: Medicare Other | Attending: Cardiovascular Disease | Admitting: Physical Therapy

## 2018-06-21 ENCOUNTER — Ambulatory Visit (HOSPITAL_BASED_OUTPATIENT_CLINIC_OR_DEPARTMENT_OTHER)
Admission: RE | Admit: 2018-06-21 | Discharge: 2018-06-21 | Disposition: A | Discharge status: Home / Self Care | Payer: Medicare Other | Source: Ambulatory Visit | Attending: Cardiovascular Disease | Admitting: Cardiovascular Disease

## 2018-06-21 ENCOUNTER — Ambulatory Visit (HOSPITAL_COMMUNITY): Payer: Medicare Other

## 2018-06-21 ENCOUNTER — Other Ambulatory Visit: Payer: Self-pay

## 2018-06-21 DIAGNOSIS — R262 Difficulty in walking, not elsewhere classified: Secondary | ICD-10-CM

## 2018-06-21 DIAGNOSIS — I350 Nonrheumatic aortic (valve) stenosis: Secondary | ICD-10-CM | POA: Insufficient documentation

## 2018-06-21 DIAGNOSIS — I700 Atherosclerosis of aorta: Secondary | ICD-10-CM | POA: Diagnosis not present

## 2018-06-21 DIAGNOSIS — R0609 Other forms of dyspnea: Secondary | ICD-10-CM

## 2018-06-21 MED ORDER — IOHEXOL 350 MG/ML SOLN
100.0000 mL | Freq: Once | INTRAVENOUS | Status: AC | PRN
  Administered 2018-06-21: 11:00:00 100 mL via INTRAVENOUS

## 2018-06-21 NOTE — Therapy (Signed)
[place], [place], [address] Phone: [phone]   Fax:  [phone]  Physical Therapy Evaluation/Pre-TAVR  Patient Details  Name: Donna Rios MRN: [id number] Date of Birth: [birth year] Referring Provider (PT): Sherren Mocha, MD   Encounter Date: 06/21/2018  PT End of Session - 06/21/18 1240    Visit Number  1    PT Start Time  6720    PT Stop Time  1320    PT Time Calculation (min)  40 min    Activity Tolerance  Patient tolerated treatment well    Behavior During Therapy  [facility] for tasks assessed/performed       Past Medical History:  Diagnosis Date  . Allergic rhinitis   . Arthritis   . Heart murmur   . Hypertension   . Phlebitis   . RUQ pain 06/21/2017   From the NM Hepato w Eject Fract order  . Stress incontinence     Past Surgical History:  Procedure Laterality Date  . BREAST BIOPSY Left    ducts removed  . Cataract surgery    . COLONOSCOPY WITH PROPOFOL N/A 09/29/2017   Procedure: COLONOSCOPY WITH PROPOFOL;  Surgeon: Lin Landsman, MD;  Location: [facility] ENDOSCOPY;  Service: Gastroenterology;  Laterality: N/A;  . EYE SURGERY    . FOOT SURGERY     7  . KNEE ARTHROSCOPY Right   . LAPAROSCOPIC HYSTERECTOMY    . RIGHT HEART CATH AND CORONARY ANGIOGRAPHY N/A 06/15/2018   Procedure: RIGHT HEART CATH AND CORONARY ANGIOGRAPHY;  Surgeon: Sherren Mocha, MD;  Location: [facility];  Service: Cardiovascular;  Laterality: N/A;  . VARICOSE VEIN SURGERY      There were no vitals filed for this visit.   Subjective Assessment - 06/21/18 1244    Subjective  I have had a heart murmur for years. Recently seeing a worsening of stenosis. I do what I want to until I get tired and stop. I am not working and that is how I have been doing it.     Currently in Pain?  Yes    Pain Score  --   0 at rest, 10 if I move it the wrong way   Pain Location  Shoulder    Pain Orientation  Left    Pain  Descriptors / Indicators  Aching    Aggravating Factors   moving the wrong way    Pain Relieving Factors  rest         [facility] PT Assessment - 06/21/18 0001      Assessment   Medical Diagnosis  severe aortic stenosis    Referring Provider (PT)  Sherren Mocha, MD    Hand Dominance  Right      Precautions   Precautions  None      Restrictions   Weight Bearing Restrictions  No      Balance Screen   Has the patient fallen in the past 6 months  --   a few times in last 2 years   Has the patient had a decrease in activity level because of a fear of falling?   No    Is the patient reluctant to leave their home because of a fear of falling?   No      Home Environment   Living Environment  Private residence    Living Arrangements  Spouse/significant other    Additional Comments  3 steps to enter home, rails on either side  Prior Function   Vocation  Unemployed      Cognition   Overall Cognitive Status  Within Functional Limits for tasks assessed      Sensation   Additional Comments  carpal tunnel symptoms in Lt hand      Posture/Postural Control   Posture Comments  forward head, increased kyphosis, rounded shoulders      ROM / Strength   AROM / PROM / Strength  AROM;Strength      AROM   Overall AROM Comments  WFL      Strength   Overall Strength Comments  gross 5/5    Strength Assessment Site  Hand    Right/Left hand  Right;Left    Right Hand Grip (lbs)  25    Left Hand Grip (lbs)  30       [facility] Pre-Surgical Assessment - 06/21/18 0001    5 Meter Walk Test- trial 1  3 sec    5 Meter Walk Test- trial 2  3 sec.     5 Meter Walk Test- trial 3  4 sec.    5 meter walk test average  3.33 sec    4 Stage Balance Test Position  4    Sit To Stand Test- trial 1  21 sec.    6 Minute Walk- Baseline  yes    BP (mmHg)  149/80    HR (bpm)  77    02 Sat (%RA)  96 %    Modified Borg Scale for Dyspnea  0- Nothing at all    Perceived Rate of Exertion (Borg)  6-    6 Minute  Walk Post Test  yes    BP (mmHg)  158/71    HR (bpm)  86    02 Sat (%RA)  99 %    Modified Borg Scale for Dyspnea  5- Strong or hard breathing    Perceived Rate of Exertion (Borg)  11- Fairly light    Aerobic Endurance Distance Walked  1155    Endurance additional comments  34% disability compared to age related norm              Objective measurements completed on examination: See above findings.                           Plan - 06/21/18 1330    PT Frequency  --   one time pre-TAVR evaluation   Consulted and Agree with Plan of Care  Patient       Clinical Impression Statement: Pt is a 68 yo F presenting to OP PT for evaluation prior to possible TAVR surgery due to severe aortic stenosis. Pt denies SOB that limits her functional ability. Pt presents with WFL ROM and strength and occasional musculoskeletal pain in Left shoulder when she moves it "the wrong way". Pt ambulated a total of 1155 feet in 6 minute walk and reported 5/10 SOB on modified scale for dyspena and 11/20 RPE on Borg's perceived exertion and pain scale at the end of the walk. During the 6 minute walk test, patient's HR increased to 101 BPM and O2 saturation decreased to 98%. Based on the Short Physical Performance Battery, patient has a frailty rating of 9/12 with </= 5/12 considered frail.  Visit Diagnosis: Difficulty in walking, not elsewhere classified     Problem List Patient Active Problem List   Diagnosis Date Noted  . GERD (gastroesophageal reflux disease) 06/13/2018  . Foot pain,  bilateral 02/10/2018  . Neck pain 02/08/2018  . Head injury 02/08/2018  . BPPV (benign paroxysmal positional vertigo) 02/08/2018  . Respiratory illness 01/02/2018  . Colon cancer screening   . Severe aortic valve stenosis 08/16/2017  . Sinusitis 08/10/2017  . Chronic left hip pain 08/10/2017  . Rotator cuff impingement syndrome of left shoulder 08/10/2017  . Elevated BP without diagnosis of  hypertension 05/09/2017  . Anxiety and depression 05/09/2017  . Lesion of right eyelid 04/01/2017  . Hyperlipidemia 05/07/2016  . Prediabetes 11/06/2015  . Urine frequency 08/15/2015  . Allergic rhinitis 08/06/2015  . Venous insufficiency 06/11/2015  . Stress incontinence 06/11/2015  . Squamous cell carcinoma of skin 06/11/2015  . Tobacco abuse 06/11/2015  . Chronic headaches 06/11/2015  . Heart murmur 06/11/2015    Raquon Milledge C. Ebert Forrester PT, DPT 06/21/18 1:32 PM   [facility] [address] [place], [place], [address] Phone: [phone]   Fax:  [phone]  Name: Donna Rios MRN: [id number] Date of Birth: 1950-11-27

## 2018-06-21 NOTE — Addendum Note (Signed)
Addended by: Jonna Coup on: 06/21/2018 01:34 PM   Modules accepted: Orders

## 2018-06-21 NOTE — Progress Notes (Signed)
Bilateral carotid duplex completed. Results in Chart review CV Proc. [place] Aubriegh Minch,RVS 06/21/2018 9:41

## 2018-06-22 ENCOUNTER — Institutional Professional Consult (permissible substitution): Payer: Medicare Other | Admitting: Cardiovascular Disease

## 2018-06-23 ENCOUNTER — Telehealth: Payer: Self-pay | Admitting: Physician Assistant

## 2018-06-23 NOTE — Telephone Encounter (Addendum)
  [place] VALVE TEAM  Patient had pre TAVR CT scans this week and has had an itchy rash since. Likely due to contrast dye used ( 100 mL). Interestingly, she had no reaction with cath but dye load was lower around 55 mL. She had been using benadryl which I have recommended she continue. If it becomes worse we can call in prednisone. She will continue to watch for now.   Angelena Form PA-C  [facility]

## 2018-06-23 NOTE — Telephone Encounter (Signed)
If there is question of dye allergy, I would just premedicate her. To my knowledge contrast load is not a big factor.

## 2018-06-26 ENCOUNTER — Encounter: Payer: Medicare Other | Admitting: Surgery

## 2018-06-26 ENCOUNTER — Other Ambulatory Visit: Payer: Self-pay | Admitting: Physician Assistant

## 2018-06-26 DIAGNOSIS — I350 Nonrheumatic aortic (valve) stenosis: Secondary | ICD-10-CM

## 2018-06-26 MED ORDER — PREDNISONE 10 MG (21) PO TBPK: 10.0000 mg | ORAL_TABLET | Freq: Every day | ORAL | 0 refills | Status: DC

## 2018-06-26 NOTE — Telephone Encounter (Signed)
Sounds good

## 2018-06-28 ENCOUNTER — Other Ambulatory Visit: Payer: Self-pay

## 2018-06-29 ENCOUNTER — Institutional Professional Consult (permissible substitution): Payer: Medicare Other | Admitting: Surgery

## 2018-06-29 ENCOUNTER — Encounter: Payer: Self-pay | Admitting: Surgery

## 2018-06-29 VITALS — BP 140/74 | HR 78 | Temp 97.5°F | Resp 16 | Ht 68.0 in | Wt 218.0 lb

## 2018-06-29 DIAGNOSIS — I350 Nonrheumatic aortic (valve) stenosis: Secondary | ICD-10-CM | POA: Diagnosis not present

## 2018-06-29 NOTE — Progress Notes (Signed)
Patient ID: Donna Rios, female   DOB: 1950-06-06, 68 y.o.   MRN: [id number]  [facility] CONSULTATION REPORT  Referring Provider is Rockey Situ, Kathlene November, MD Primary Cardiologist is No primary care provider on file. PCP is [name], MD  Chief Complaint  Patient presents with   Aortic Stenosis    Surgical eval for TAVR, review all testing/studies    HPI:  The patient is a 68 year old woman with a history of hypertension, degenerative arthritis, and known bicuspid aortic valve with severe aortic stenosis by echocardiogram in May 2019.  She tends to minimize her symptoms and is not that active due to limitation from arthritis.  She says she does have mild shortness of breath with physical activity and gets fatigued but takes things slowly and does not feel like it changes her routine.  She has had occasional episodes of chest discomfort but not necessarily related to exertion.  She denies any dizziness or syncope.  She has had no orthopnea or PND.  She does have peripheral edema.  She had a follow-up echocardiogram in March 2020 which showed progression of her aortic stenosis to critical with her mean aortic valve gradient increasing from 46 mm 1 year ago to 60 mm now.  Her peak gradient is 96 mmHg.  Aortic valve area was measured at 0.69 cm.  She subsequently underwent cardiac catheterization on 06/15/2018 which showed widely patent coronary arteries with no obstructive coronary disease.  Right heart pressures and cardiac output were normal.  The patient is here today by herself.  She is married and lives with her husband.  She continues to smoke about 10 cigarettes/day.  She has smoked much more in the past but is trying to quit.  She is using a nicotine patch.  She has a history of alcohol abuse in the past but no longer drinks.  Past Medical History:  Diagnosis Date   Allergic rhinitis    Arthritis     Heart murmur    Hypertension    Phlebitis    RUQ pain 06/21/2017   From the NM Hepato w Eject Fract order   Stress incontinence     Past Surgical History:  Procedure Laterality Date   BREAST BIOPSY Left    ducts removed   Cataract surgery     COLONOSCOPY WITH PROPOFOL N/A 09/29/2017   Procedure: COLONOSCOPY WITH PROPOFOL;  Surgeon: Lin Landsman, MD;  Location: [facility] ENDOSCOPY;  Service: Gastroenterology;  Laterality: N/A;   EYE SURGERY     FOOT SURGERY     7   KNEE ARTHROSCOPY Right    LAPAROSCOPIC HYSTERECTOMY     RIGHT HEART CATH AND CORONARY ANGIOGRAPHY N/A 06/15/2018   Procedure: RIGHT HEART CATH AND CORONARY ANGIOGRAPHY;  Surgeon: Sherren Mocha, MD;  Location: [facility];  Service: Cardiovascular;  Laterality: N/A;   VARICOSE VEIN SURGERY      Family History  Problem Relation Age of Onset   Alcoholism Other    Arthritis Other    Lung cancer Other    Heart disease Other    Stroke Other    Hypertension Other    Diabetes Other    Heart failure Mother    Hypertension Mother    Diabetes Mother    Heart failure Father    Hypotension Father    Breast cancer Neg Hx     Social History   Socioeconomic History   Marital status: Married  Spouse name: Not on file   Number of children: Not on file   Years of education: Not on file   Highest education level: Not on file  Occupational History   Not on file  Social Needs   Financial resource strain: Not hard at all   Food insecurity:    Worry: Never true    Inability: Never true   Transportation needs:    Medical: No    Non-medical: No  Tobacco Use   Smoking status: Light Tobacco Smoker    Packs/day: 0.50    Years: 46.00    Pack years: 23.00    Types: Cigarettes   Smokeless tobacco: Never Used   Tobacco comment: stopped 06/11/2018...smokes 1-10 per day  Substance and Sexual Activity   Alcohol use: Not Currently    Alcohol/week: 0.0 standard drinks   Drug  use: No   Sexual activity: Not on file  Lifestyle   Physical activity:    Days per week: 0 days    Minutes per session: Not on file   Stress: Not on file  Relationships   Social connections:    Talks on phone: Not on file    Gets together: Not on file    Attends religious service: Not on file    Active member of club or organization: Not on file    Attends meetings of clubs or organizations: Not on file    Relationship status: Not on file   Intimate partner violence:    Fear of current or ex partner: No    Emotionally abused: No    Physically abused: No    Forced sexual activity: No  Other Topics Concern   Not on file  Social History Narrative   Not on file    Current Outpatient Medications  Medication Sig Dispense Refill   acetaminophen (TYLENOL) 650 MG CR tablet Take 650 mg by mouth every 8 (eight) hours as needed for fever.      APPLE CIDER VINEGAR PO Take 1 tablet by mouth daily as needed (with fatty foods).      b complex vitamins tablet Take 1 tablet by mouth every other day.     Calcium Carbonate-Vitamin D (CALCIUM 500 + D) 500-125 MG-UNIT TABS Take 1 tablet by mouth daily.      cetirizine (ZYRTEC) 10 MG tablet Take 10 mg by mouth daily at 8 pm.      fluticasone (FLONASE) 50 MCG/ACT nasal spray Place 1 spray into both nostrils daily as needed for allergies.      Menthol, Topical Analgesic, (ICY HOT EX) Apply 1 application topically 4 (four) times daily as needed (muscle pain).      metoprolol tartrate (LOPRESSOR) 50 MG tablet Take one tablet by mouth as directed on 06/21/18 prior to TAVR CT scans 1 tablet 0   Multiple Vitamin (MULTIVITAMIN WITH MINERALS) TABS tablet Take 1 tablet by mouth daily. Women's Complete Multivitamin 50+     naphazoline-glycerin (CLEAR EYES) 0.012-0.2 % SOLN Place 1 drop into both eyes 4 (four) times daily as needed (redness.).      nicotine (NICODERM CQ - DOSED IN MG/24 HOURS) 21 mg/24hr patch Place 10.5 mg onto the skin daily.      predniSONE (STERAPRED UNI-PAK 21 TAB) 10 MG (21) TBPK tablet Take 1 tablet (10 mg total) by mouth daily. Follow instructions: 60mg , 50mg , 40mg , 30mg , 20mg , 10mg  21 tablet 0   Probiotic Product (PROBIOTIC ADVANCED PO) Take 1 tablet by mouth daily as needed (for digestive health.).  Propylene Glycol (SYSTANE COMPLETE OP) Place 1 drop into both eyes 3 (three) times daily as needed (dry/irritated eyes.).      rosuvastatin (CRESTOR) 40 MG tablet Take 1 tablet (40 mg total) by mouth daily. 90 tablet 2   sodium chloride (OCEAN) 0.65 % SOLN nasal spray Place 1 spray into both nostrils 4 (four) times daily as needed for congestion.      ibuprofen (ADVIL,MOTRIN) 200 MG tablet Take 400 mg by mouth at bedtime as needed (for pain).      No current facility-administered medications for this visit.     Allergies  Allergen Reactions   Ibuprofen Other (See Comments)    High does causes stomach pains   Asa [Aspirin] Other (See Comments)   Iohexol Rash    Pt had reaction after 06/21/18 TAVR CT scans      Review of Systems:   General:  normal appetite, +decreased energy, + weight gain, + weight loss, no fever  Cardiac:  no chest pain with exertion, no chest pain at rest, +SOB with  exertion, no resting SOB, no PND, no orthopnea, + palpitations, no arrhythmia, no atrial fibrillation, + LE edema, no dizzy spells, no syncope  Respiratory:  + shortness of breath, no home oxygen, + productive cough, no dry cough, no bronchitis, no wheezing, no hemoptysis, no asthma, no pain with inspiration or cough, no sleep apnea, no CPAP at night  GI:   no difficulty swallowing, no reflux, no frequent heartburn, no hiatal hernia, no abdominal pain, no constipation, no diarrhea, no hematochezia, no hematemesis, no melena  GU:   no dysuria,  + frequency, no urinary tract infection, no hematuria,  no kidney stones, no kidney disease  Vascular:  no pain suggestive of claudication, no pain in feet, + leg cramps, no  varicose veins, no DVT, no non-healing foot ulcer  Neuro:   no stroke, no TIA's, no seizures, + headaches, no temporary blindness one eye,  no slurred speech, + peripheral neuropathy, no chronic pain, no instability of gait, no memory/cognitive dysfunction  Musculoskeletal: + arthritis, no joint swelling, no myalgias, no difficulty walking, normal mobility   Skin:   + rash, + itching, no skin infections, no pressure sores or ulcerations  Psych:   + anxiety, no depression, + nervousness, no unusual recent stress  Eyes:   + blurry vision, + floaters, no recent vision changes, does not wear glasses or contacts  ENT:   no hearing loss, no loose or painful teeth, no dentures, last saw dentist 2018  Hematologic:              no easy bruising,  noabnormal bleeding, no clotting disorder, no frequent epistaxis  Endocrine:  no diabetes, does not check CBG's at home    Physical Exam:   BP 140/74 (BP Location: Right Arm, Patient Position: Sitting, Cuff Size: Large)    Pulse 78    Temp (!) 97.5 F (36.4 C) (Skin)    Resp 16    Ht 5\' 8"  (1.727 m)    Wt 218 lb (98.9 kg)    SpO2 95% Comment: RA   BMI 33.15 kg/m   General:  well-appearing  HEENT:  Unremarkable, NCAT, PERLA, EOMI  Neck:   no JVD, no bruits, no adenopathy or thyromegaly  Chest:   clear to auscultation, symmetrical breath sounds, no wheezes, no rhonchi   CV:   RRR, grade III/VI crescendo/decrescendo murmur heard best at RSB,  no diastolic murmur  Abdomen:  soft, non-tender, no  masses   Extremities:  warm, well-perfused, pulses palpable, mild LE edema in ankles  Rectal/GU  Deferred  Neuro:   Grossly non-focal and symmetrical throughout  Skin:   Clean and dry, no rashes, no breakdown   Diagnostic Tests:  Patient Name:   DARIELLA GILLIHAN Date of Exam: 04/10/2018 Medical Rec #:  [id number]      Height:       68.0 in Accession #:    [id number]     Weight:       214.5 lb Date of Birth:  Nov 09, 1950      BSA:          2.11 m Patient Age:    75  years       BP:           140/80 mmHg Patient Gender: F              HR:           88 bpm. Exam Location:  [facility]    Procedure: 2D Echo, Cardiac Doppler and Color Doppler  Indications:    R01.1 Murmur; I35.2 Nonrheumatic aortic (valve) stenosis with                 insufficiency   History:        Patient has prior history of Echocardiogram examinations, most                 recent 12/23/2016. Signs/Symptoms: Murmur; Risk Factors:                 Dyslipidemia and Current Smoker.   Sonographer:    Hester Mates BS, RVT, RDCS Referring Phys: [place] Comments: Suboptimal parasternal window. IMPRESSIONS    1. The left ventricle has normal systolic function with an ejection fraction of 60-65%. The cavity size was normal. Left ventricular diastolic Doppler parameters are consistent with impaired relaxation.  2. The right ventricle has normal systolic function. The cavity was normal. There is no increase in right ventricular wall thickness.  3. Possible bicuspid aortic valve. Moderate thickening of the aortic valve Severe calcifcation of the aortic valve. Aortic valve regurgitation is trivial by color flow Doppler. severe stenosis of the aortic valve. Mean gradient of 59 mm Hg with valve  area of 0.73 .  4. The mitral valve is grossly normal.  5. The tricuspid valve is grossly normal.  FINDINGS  Left Ventricle: The left ventricle has normal systolic function, with an ejection fraction of 60-65%. The cavity size was normal. There is no increase in left ventricular wall thickness. Left ventricular diastolic Doppler parameters are consistent with  impaired relaxation. Right Ventricle: The right ventricle has normal systolic function. The cavity was normal. There is no increase in right ventricular wall thickness. Left Atrium: left atrial size was normal in size Right Atrium: right atrial size was normal in size. Right atrial pressure is estimated at 10  mmHg. Interatrial Septum: No atrial level shunt detected by color flow Doppler. Pericardium: There is no evidence of pericardial effusion. Mitral Valve: The mitral valve is grossly normal. Mitral valve regurgitation was not assessed by color flow Doppler. Tricuspid Valve: The tricuspid valve is grossly normal. Tricuspid valve regurgitation was not visualized by color flow Doppler. Aortic Valve: The aortic valve has an indeterminate number of cusps Moderate thickening of the aortic valve Severe calcifcation of the aortic valve. Aortic valve regurgitation is trivial by color flow Doppler. There is severe stenosis  of the aortic  valve, with a calculated valve area of 0.79 cm. Pulmonic Valve: The pulmonic valve was not well visualized. Pulmonic valve regurgitation was not assessed by color flow Doppler. Venous: The inferior vena cava is normal in size with greater than 50% respiratory variability. Compared to previous exam: Previous Echo showed LV EF 60-65%, no RWMA, severely calcified AoV leaflets, with mean gradient of 46 mmHg.   LEFT VENTRICLE PLAX 2D LVIDd:         5.20 cm       Diastology LVIDs:         2.90 cm       LV e' lateral:   6.53 cm/s LV PW:         1.10 cm       LV E/e' lateral: 12.9 LV IVS:        1.00 cm       LV e' medial:    5.77 cm/s LVOT diam:     1.80 cm       LV E/e' medial:  14.6 LV SV:         97 ml LV SV Index:   44.37 LVOT Area:     2.54 cm   LV Volumes (MOD) LV area d, A2C:    21.30 cm LV area d, A4C:    23.70 cm LV area s, A2C:    11.60 cm LV area s, A4C:    13.10 cm LV major d, A2C:   7.60 cm LV major d, A4C:   7.53 cm LV major s, A2C:   6.54 cm LV major s, A4C:   6.56 cm LV vol d, MOD A2C: 51.1 ml LV vol d, MOD A4C: 62.7 ml LV vol s, MOD A2C: 17.8 ml LV vol s, MOD A4C: 23.1 ml LV SV MOD A2C:     33.3 ml LV SV MOD A4C:     62.7 ml LV SV MOD BP:      36.6 ml  RIGHT VENTRICLE RV S prime:     12.60 cm/s TAPSE (M-mode): 2.8 cm RVSP:            20.5 mmHg  LEFT ATRIUM             Index       RIGHT ATRIUM           Index LA diam:        3.40 cm 1.61 cm/m  RA Pressure: 10 mmHg LA Vol (A2C):   59.9 ml 28.45 ml/m RA Area:     11.60 cm LA Vol (A4C):   45.8 ml 21.75 ml/m RA Volume:   27.10 ml  12.87 ml/m LA Biplane Vol: 55.9 ml 26.55 ml/m  AORTIC VALVE                    PULMONIC VALVE AV Area (Vmean):   0.69 cm     PV Vmax:       1.06 m/s AV Area (VTI):     0.79 cm     PV Vmean:      84.700 cm/s AV Vmax:           491.00 cm/s  PV VTI:        0.235 m AV Vmean:          368.000 cm/s PV Peak grad:  4.5 mmHg AV VTI:            1.037 m      PV Mean  grad:  3.0 mmHg AV Peak Grad:      96.4 mmHg AV Mean Grad:      60.5 mmHg LVOT Vmean:        100.000 cm/s LVOT VTI:          0.321 m LVOT/AV VTI ratio: 0.31 AR PHT:            360 msec   AORTA Ao Root diam: 3.20 cm  MITRAL VALVE              TRICUSPID VALVE MV Area (PHT): 2.99 cm   TR Peak grad:   10.5 mmHg MV PHT:        73.66 msec TR Vmax:        162.00 cm/s MV Decel Time: 254 msec   RVSP:           20.5 mmHg MV E velocity: 84.50 cm/s MV A velocity: 99.90 cm/s SHUNTS MV E/A ratio:  0.85       Systemic VTI:  0.32 m                           Systemic Diam: 1.80 cm    Kathlyn Sacramento MD Electronically signed by Kathlyn Sacramento MD Signature Date/Time: 04/10/2018/4:59:08 PM     Physicians   Panel Physicians Referring Physician Case Authorizing Physician  Sherren Mocha, MD (Primary)    Procedures   RIGHT HEART CATH AND CORONARY ANGIOGRAPHY  Conclusion   1. Known severe aortic stenosis by echo (mean gradient 60 mmHg) with heavily calcified and restricted aortic valve leaflets by fluoroscopy 2. Widely patent coronary arteries with no obstructive CAD 3. Normal right heart pressures with high cardiac output noted  Plan: continue multidisciplinary evaluation for treatment of severe aortic stenosis  Indications   Severe aortic stenosis [I35.0 (ICD-10-CM)]    Procedural Details   Technical Details INDICATION: Severe, symptomatic aortic stenosis.  PROCEDURAL DETAILS: There was an indwelling IV in a right antecubital vein. Using normal sterile technique, the IV was changed out for a 5 Fr brachial sheath over a 0.018 inch wire. The right wrist was then prepped, draped, and anesthetized with 1% lidocaine. Using the modified Seldinger technique a 5/6 French Slender sheath was placed in the right radial artery. Intra-arterial verapamil was administered through the radial artery sheath. IV heparin was administered after a JR4 catheter was advanced into the central aorta. A Swan-Ganz catheter was used for the right heart catheterization. Standard protocol was followed for recording of right heart pressures and sampling of oxygen saturations. Fick cardiac output was calculated. Standard Judkins catheters were used for selective coronary angiography. No attempt was made at crossing the aortic valve.  There were no immediate procedural complications. The patient was transferred to the post catheterization recovery area for further monitoring.    Estimated blood loss <50 mL.   During this procedure medications were administered to achieve and maintain moderate conscious sedation while the patient's heart rate, blood pressure, and oxygen saturation were continuously monitored and I was present face-to-face 100% of this time.  Medications  (Filter: Administrations occurring from 06/15/18 1415 to 06/15/18 1516)  Medication Rate/Dose/Volume Action  Date Time   midazolam (VERSED) injection (mg) 2 mg Given 06/15/18 1435   Total dose as of 06/30/18 1118        2 mg        fentaNYL (SUBLIMAZE) injection (mcg) 25 mcg Given 06/15/18 1435   Total dose as of 06/30/18 1118  25 mcg        lidocaine (PF) (XYLOCAINE) 1 % injection (mL) 2 mL Given 06/15/18 1445   Total dose as of 06/30/18 1118 2 mL Given 1447   4 mL        Heparin (Porcine) in NaCl 1000-0.9 UT/500ML-%  SOLN (mL) 500 mL Given 06/15/18 1445   Total dose as of 06/30/18 1118 500 mL Given 1445   1,000 mL        Radial Cocktail/Verapamil only (mL) 10 mL Given 06/15/18 1448   Total dose as of 06/30/18 1118        10 mL        heparin injection (Units) 5,000 Units Given 06/15/18 1457   Total dose as of 06/30/18 1118        5,000 Units        iohexol (OMNIPAQUE) 350 MG/ML injection (mL) 55 mL Given 06/15/18 1510   Total dose as of 06/30/18 1118        55 mL        Sedation Time   Sedation Time Physician-1: 29 minutes 34 seconds  Coronary Findings   Diagnostic  Dominance: Right  Left Anterior Descending  Vessel is angiographically normal. Widely patent LAD with no stenosis. 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 Vessel is angiographically normal.  Left Circumflex  First Obtuse Marginal Branch  Vessel is angiographically normal.  Right Coronary Artery  Vessel is large. Vessel is angiographically normal. Large, dominant RCA without stenosis. The PDA and PLA branches are widely patent.  Intervention   No interventions have been documented.  Left Heart   Aortic Valve The aortic valve is calcified. There is restricted aortic valve motion.  Coronary Diagrams   Diagnostic  Dominance: Right    Intervention   Implants    No implant documentation for this case.  Syngo Images   Show images for CARDIAC CATHETERIZATION  Images on Long Term Storage   Show images for Jaia, Alonge "[name]"   Link to Procedure Log   Procedure Log    Hemo Data    Most Recent Value  Fick Cardiac Output 7.94 L/min  Fick Cardiac Output Index 3.76 (L/min)/BSA  RA A Wave 9 mmHg  RA V Wave 6 mmHg  RA Mean 5 mmHg  RV Systolic Pressure 29 mmHg  RV Diastolic Pressure 1 mmHg  RV EDP 5 mmHg  PA Systolic Pressure 26 mmHg  PA Diastolic Pressure 12 mmHg  PA Mean 18 mmHg  PW A Wave 11 mmHg  PW V Wave 7 mmHg  PW Mean 5 mmHg  AO  Systolic Pressure 536 mmHg  AO Diastolic Pressure 76 mmHg  AO Mean 103 mmHg  QP/QS 1  TPVR Index 4.79 HRUI  TSVR Index 27.4 HRUI  PVR SVR Ratio 0.13  TPVR/TSVR Ratio 0.17    ADDENDUM REPORT: 06/21/2018 17:08  CLINICAL DATA:  68 year old female with suspected bicuspid aortic valve and severe aortic stenosis being evaluated for a TAVR procedure.  EXAM: Cardiac TAVR CT  TECHNIQUE: The patient was scanned on a [employer]. A 120 kV retrospective scan was triggered in the descending thoracic aorta at 111 HU's. Gantry rotation speed was 250 msecs and collimation was .6 mm. No beta blockade or nitro were given. The 3D data set was reconstructed in 5% intervals of the R-R cycle. Systolic and diastolic phases were analyzed on a dedicated work station using MPR, MIP and  VRT modes. The patient received 80 cc of contrast.  FINDINGS: Aortic Valve: Trileaflet aortic valve with severely calcified and thickened leaflets and moderately restricted leaflet openings. There are no calcifications extending into the LVOT.  Aorta: Normal size with minimal calcifications and atherosclerotic plaque and no dissection.  Sinotubular Junction: 25 x 25 mm  Ascending Thoracic Aorta: 35 x 35 mm  Aortic Arch: 25 x 25 mm  Descending Thoracic Aorta: 27 x 26 mm  Sinus of Valsalva Measurements:  Non-coronary: 31 mm  Right -coronary: 31 mm  Left -coronary: 32 mm  Coronary Artery Height above Annulus:  Left Main: 17 mm  Right Coronary: 18 mm  Virtual Basal Annulus Measurements:  Maximum/Minimum Diameter: 27.4 x 23.4 mm  Mean Diameter: 24.5 mm  Perimeter: 79.6 mm  Area: 472 mm2  Optimum Fluoroscopic Angle for Delivery:  RAO 0 CRA 0.  IMPRESSION: 1. Trileaflet aortic valve with severely calcified and thickened leaflets and moderately restricted leaflet openings. There are no calcifications extending into the LVOT. Annular measurements are suitable for  delivery of a 26 mm Edwards-SAPIEN 3 valve.  2. Aortic valve calcium score is 1759 consistent with severe aortic stenosis.  3. Sufficient coronary to annulus distance.  4. Optimum Fluoroscopic Angle for Delivery: RAO 0 CRA 0.  5. No thrombus in the left atrial appendage.   Electronically Signed   By: Ena Dawley   On: 06/21/2018 17:08   Addended by Dorothy Spark, MD on 06/21/2018 5:11 PM    Study Result   EXAM: OVER-READ INTERPRETATION  CT CHEST  The following report is an over-read performed by radiologist Dr. Vinnie Langton of [facility], [place] on 06/21/2018. This over-read does not include interpretation of cardiac or coronary anatomy or pathology. The coronary calcium score/coronary CTA interpretation by the cardiologist is attached.  COMPARISON:  CT the abdomen and pelvis 01/03/2018. Chest CT 06/21/2017.  FINDINGS: Extracardiac findings will be described separately under dictation for contemporaneously obtained CTA chest, abdomen and pelvis.  IMPRESSION: Please see separate dictation for contemporaneously obtained CTA chest, abdomen and pelvis dated 06/21/2018 for full description of relevant extracardiac findings.  Electronically Signed: By: Vinnie Langton M.D. On: 06/21/2018 12:10        CLINICAL DATA:  68 year old female with history of severe aortic stenosis. Preprocedural study prior to potential transcatheter aortic valve replacement (TAVR) procedure.  EXAM: CT ANGIOGRAPHY CHEST, ABDOMEN AND PELVIS  TECHNIQUE: Multidetector CT imaging through the chest, abdomen and pelvis was performed using the standard protocol during bolus administration of intravenous contrast. Multiplanar reconstructed images and MIPs were obtained and reviewed to evaluate the vascular anatomy.  CONTRAST:  19mL OMNIPAQUE IOHEXOL 350 MG/ML SOLN  COMPARISON:  CT the abdomen and pelvis 01/03/2018. Low-dose lung cancer screening chest CT  06/21/2017.  FINDINGS: CTA CHEST FINDINGS  Cardiovascular: Heart size is borderline enlarged. There is no significant pericardial fluid, thickening or pericardial calcification. Aortic atherosclerosis. No definite coronary artery calcifications. Severe thickening calcification of the aortic valve.  Mediastinum/Lymph Nodes: No pathologically enlarged no suspicious appearing pulmonary nodules or masses. No acute consolidative airspace disease. No pleural effusions. Lymph nodes. Esophagus is unremarkable in appearance. No axillary lymphadenopathy.  Lungs/Pleura: No suspicious appearing pulmonary nodules or masses. No acute consolidative airspace disease. No pleural effusions.  Musculoskeletal/Soft Tissues: There are no aggressive appearing lytic or blastic lesions noted in the visualized portions of the skeleton.  CTA ABDOMEN AND PELVIS FINDINGS  Hepatobiliary: No suspicious cystic or solid hepatic lesions. No intra or extrahepatic biliary ductal  dilatation. Gallbladder is normal in appearance.  Pancreas: No pancreatic mass. No pancreatic ductal dilatation. No pancreatic or peripancreatic fluid or inflammatory changes.  Spleen: Unremarkable.  Adrenals/Urinary Tract: Bilateral kidneys and bilateral adrenal glands are normal in appearance. No hydroureteronephrosis. Urinary bladder is normal in appearance.  Stomach/Bowel: Normal appearance of the stomach. No pathologic dilatation of small bowel or colon. Normal appendix.  Vascular/Lymphatic: Vascular findings and measurements pertinent to potential TAVR procedure, as detailed below. Fusiform ectasia of the infrarenal abdominal aorta measuring up to 2.4 x 2.4 cm. No aneurysm or dissection noted in the abdominal or pelvic vasculature. No lymphadenopathy noted in the abdomen or pelvis.  Reproductive: Status post hysterectomy. Ovaries are not confidently identified may be surgically absent or atrophic.  Other: No  significant volume of ascites.  No pneumoperitoneum.  Musculoskeletal: There are no aggressive appearing lytic or blastic lesions noted in the visualized portions of the skeleton.  VASCULAR MEASUREMENTS PERTINENT TO TAVR:  AORTA:  Minimal Aortic Diameter-13 x 18 mm  Severity of Aortic Calcification-moderate  RIGHT PELVIS:  Right Common Iliac Artery -  Minimal Diameter-8.9 x 9.3 mm  Tortuosity-mild  Calcification-moderate  Right External Iliac Artery -  Minimal Diameter-6.4 x 5.9 mm  Tortuosity-severe  Calcification-none  Right Common Femoral Artery -  Minimal Diameter-6.6 x 6.3 mm  Tortuosity-mild  Calcification-mild  LEFT PELVIS:  Left Common Iliac Artery -  Minimal Diameter-9.8 x 8.5 mm  Tortuosity-mild  Calcification-mild-to-moderate  Left External Iliac Artery -  Minimal Diameter-6.5 x 6.1 mm  Tortuosity-moderate  Calcification-minimal  Left Common Femoral Artery -  Minimal Diameter-7.0 x 6.6 mm  Tortuosity-mild  Calcification-mild  Review of the MIP images confirms the above findings.  IMPRESSION: 1. Vascular findings and measurements pertinent to potential TAVR procedure, as detailed above. 2. Severe thickening calcification of the aortic valve, compatible with the reported clinical history of severe aortic stenosis. 3. Additional incidental findings, as above.   Electronically Signed   By: Vinnie Langton M.D.   On: 06/21/2018 14:10   STS Risk Calculator (Isolated AVR): Risk of Mortality:  1.041% Renal Failure:  1.109% Permanent Stroke:  0.818% Prolonged Ventilation:  3.745% DSW Infection:  0.096% Reoperation:  2.176% Morbidity or Mortality:  6.441% Short Length of Stay:  51.827% Long Length of Stay:  2.275%  Impression:  This 68 year old woman has stage D, critical, symptomatic aortic stenosis with New York Heart Association class II symptoms of exertional fatigue and  shortness of breath consistent with chronic diastolic congestive heart failure.  I have personally reviewed her 2D echocardiogram, cardiac catheterization, and CTA studies.  Her most recent echocardiogram shows a severely calcified aortic valve with markedly decreased leaflet excursion.  Is not possible to tell if this is a bicuspid or trileaflet valve.  The mean aortic valve gradient of 60 mmHg consistent with critical aortic stenosis.  Left ventricular systolic function is normal with ejection fraction of 60 to 65%.  Her cardiac catheterization shows no significant coronary disease with normal right heart pressures.  I agree that aortic valve replacement is indicated in this patient with critical aortic stenosis who has had a significant increase in her mean gradient over the past year.  The patient was counseled at length regarding treatment alternatives for management of severe symptomatic aortic stenosis. The risks and benefits of surgical intervention has been discussed in detail. Long-term prognosis with medical therapy was discussed. Alternative approaches such as conventional surgical aortic valve replacement, transcatheter aortic valve replacement, and palliative medical therapy were compared and contrasted  at length. This discussion was placed in the context of the patient's own specific clinical presentation and past medical history. All of her questions have been addressed.  She would rather proceed with transcatheter aortic valve replacement due to her degenerative arthritis and reduced mobility.  I think that is a reasonable alternative for her.  Her gated cardiac CTA shows anatomy suitable for transcatheter aortic valve replacement.  Her abdominal pelvic CTA shows adequate pelvic vascular anatomy to allow transfemoral insertion.    Following the decision to proceed with transcatheter aortic valve replacement, a discussion was held regarding what types of management strategies would be attempted  intraoperatively in the event of life-threatening complications, including whether or not the patient would be considered a candidate for the use of cardiopulmonary bypass and/or conversion to open sternotomy for attempted surgical intervention.  Her surgical risk for open AVR is low and I think she would certainly be a candidate for emergent sternotomy if needed to manage any intraoperative complications. The patient has been advised of a variety of complications that might develop including but not limited to risks of death, stroke, paravalvular leak, aortic dissection or other major vascular complications, aortic annulus rupture, device embolization, cardiac rupture or perforation, mitral regurgitation, acute myocardial infarction, arrhythmia, heart block or bradycardia requiring permanent pacemaker placement, congestive heart failure, respiratory failure, renal failure, pneumonia, infection, other late complications related to structural valve deterioration or migration, or other complications that might ultimately cause a temporary or permanent loss of functional independence or other long term morbidity. The patient provides full informed consent for the procedure as described and all questions were answered.     Plan:  We will proceed with trans-femoral transcatheter aortic valve placement on Tuesday, 07/11/2018.    I spent 60 minutes performing this consultation and > 50% of this time was spent face to face counseling and coordinating the care of this patient's symptomatic critical aortic valve stenosis.    Gaye Pollack, MD 06/29/2018 1:19 PM

## 2018-07-02 ENCOUNTER — Other Ambulatory Visit: Payer: Self-pay

## 2018-07-02 DIAGNOSIS — I350 Nonrheumatic aortic (valve) stenosis: Secondary | ICD-10-CM

## 2018-07-03 ENCOUNTER — Other Ambulatory Visit: Payer: Self-pay

## 2018-07-03 MED ORDER — PREDNISONE 50 MG PO TABS: ORAL_TABLET | ORAL | 0 refills | Status: DC

## 2018-07-04 ENCOUNTER — Other Ambulatory Visit: Payer: Self-pay

## 2018-07-04 DIAGNOSIS — I350 Nonrheumatic aortic (valve) stenosis: Secondary | ICD-10-CM

## 2018-07-05 ENCOUNTER — Encounter: Payer: Self-pay | Admitting: Surgery

## 2018-07-06 NOTE — Pre-Procedure Instructions (Signed)
Donna Rios  07/06/2018     [id number] - [place], [place] - [address] [place] [address] Phone: [phone] Fax: [phone]   Your procedure is scheduled on Tuesday, June 16th  Report to [facility] Entrance A at 9:00 A.M.  Call this number if you have problems the morning of surgery:  [phone]   Remember:  Do not eat or drink after midnight.    Take these medicines the morning of surgery with A SIP OF WATER   Please take Prednisone 50mg  at 10:00 PM on Monday 6/15. On the morning of surgery do not take any medications except Prednisone 50mg  by mouth at 4:00 AM with a few sips of water.    Do not wear jewelry, make-up or nail polish.  Do not wear lotions, powders, or perfumes, or deodorant.  Do not shave 48 hours prior to surgery.    Do not bring valuables to the hospital.  [facility] is not responsible for any belongings or valuables.  7 days prior to surgery STOP taking any Aspirin (unless otherwise instructed by your surgeon), Aleve, Naproxen, Ibuprofen, Motrin, Advil, Goody's, BC's, all herbal medications, fish oil, and all vitamins.  Follow your surgeon's instructions on when to stop Aspirin.  If no instructions were given by your surgeon then you will need to call the office to get those instructions.    Contacts, dentures or bridgework may not be worn into surgery.  Leave your suitcase in the car.  After surgery it may be brought to your room.  For patients admitted to the hospital, discharge time will be determined by your treatment team.  Patients discharged the day of surgery will not be allowed to drive home.   Special instructions:  [place]- Preparing For Surgery  Before surgery, you can play an important role. Because skin is not sterile, your skin needs to be as free of germs as possible. You can reduce the number of germs on your skin by washing with CHG (chlorahexidine gluconate) Soap before surgery.  CHG is an  antiseptic cleaner which kills germs and bonds with the skin to continue killing germs even after washing.    Oral Hygiene is also important to reduce your risk of infection.  Remember - BRUSH YOUR TEETH THE MORNING OF SURGERY WITH YOUR REGULAR TOOTHPASTE  Please do not use if you have an allergy to CHG or antibacterial soaps. If your skin becomes reddened/irritated stop using the CHG.  Do not shave (including legs and underarms) for at least 48 hours prior to first CHG shower. It is OK to shave your face.  Please follow these instructions carefully.   1. Shower the NIGHT BEFORE SURGERY and the MORNING OF SURGERY with CHG.   2. If you chose to wash your hair, wash your hair first as usual with your normal shampoo.  3. After you shampoo, rinse your hair and body thoroughly to remove the shampoo.  4. Use CHG as you would any other liquid soap. You can apply CHG directly to the skin and wash gently with a scrungie or a clean washcloth.   5. Apply the CHG Soap to your body ONLY FROM THE NECK DOWN.  Do not use on open wounds or open sores. Avoid contact with your eyes, ears, mouth and genitals (private parts). Wash Face and genitals (private parts)  with your normal soap.  6. Wash thoroughly, paying special attention to the area where your surgery will be  performed.  7. Thoroughly rinse your body with warm water from the neck down.  8. DO NOT shower/wash with your normal soap after using and rinsing off the CHG Soap.  9. Pat yourself dry with a CLEAN TOWEL.  10. Wear CLEAN PAJAMAS to bed the night before surgery, wear comfortable clothes the morning of surgery  11. Place CLEAN SHEETS on your bed the night of your first shower and DO NOT SLEEP WITH PETS.  Day of Surgery:  Do not apply any deodorants/lotions.  Please wear clean clothes to the hospital/surgery center.   Remember to brush your teeth WITH YOUR REGULAR TOOTHPASTE.  Please read over the following fact sheets that you were  given. Pain Booklet, Coughing and Deep Breathing, MRSA Information and Surgical Site Infection Prevention

## 2018-07-07 ENCOUNTER — Encounter (HOSPITAL_COMMUNITY): Payer: Self-pay

## 2018-07-07 ENCOUNTER — Encounter (HOSPITAL_COMMUNITY)
Admission: RE | Admit: 2018-07-07 | Discharge: 2018-07-07 | Disposition: A | Discharge status: Home / Self Care | Payer: Medicare Other | Source: Ambulatory Visit | Attending: Cardiovascular Disease | Admitting: Cardiovascular Disease

## 2018-07-07 ENCOUNTER — Other Ambulatory Visit: Payer: Self-pay

## 2018-07-07 ENCOUNTER — Ambulatory Visit (HOSPITAL_COMMUNITY)
Admission: RE | Admit: 2018-07-07 | Discharge: 2018-07-07 | Disposition: A | Discharge status: Home / Self Care | Payer: Medicare Other | Source: Ambulatory Visit | Attending: Cardiovascular Disease | Admitting: Cardiovascular Disease

## 2018-07-07 ENCOUNTER — Other Ambulatory Visit (HOSPITAL_COMMUNITY)
Admission: RE | Admit: 2018-07-07 | Discharge: 2018-07-07 | Disposition: A | Discharge status: Home / Self Care | Payer: Medicare Other | Source: Ambulatory Visit | Attending: Cardiovascular Disease | Admitting: Cardiovascular Disease

## 2018-07-07 DIAGNOSIS — Z7951 Long term (current) use of inhaled steroids: Secondary | ICD-10-CM | POA: Insufficient documentation

## 2018-07-07 DIAGNOSIS — I350 Nonrheumatic aortic (valve) stenosis: Secondary | ICD-10-CM | POA: Diagnosis not present

## 2018-07-07 DIAGNOSIS — Z87891 Personal history of nicotine dependence: Secondary | ICD-10-CM | POA: Diagnosis not present

## 2018-07-07 DIAGNOSIS — Z79899 Other long term (current) drug therapy: Secondary | ICD-10-CM | POA: Insufficient documentation

## 2018-07-07 DIAGNOSIS — Z1159 Encounter for screening for other viral diseases: Secondary | ICD-10-CM | POA: Diagnosis not present

## 2018-07-07 DIAGNOSIS — Z791 Long term (current) use of non-steroidal anti-inflammatories (NSAID): Secondary | ICD-10-CM | POA: Diagnosis not present

## 2018-07-07 DIAGNOSIS — Z7952 Long term (current) use of systemic steroids: Secondary | ICD-10-CM | POA: Insufficient documentation

## 2018-07-07 DIAGNOSIS — I10 Essential (primary) hypertension: Secondary | ICD-10-CM | POA: Insufficient documentation

## 2018-07-07 DIAGNOSIS — M1990 Unspecified osteoarthritis, unspecified site: Secondary | ICD-10-CM | POA: Insufficient documentation

## 2018-07-07 DIAGNOSIS — Z01810 Encounter for preprocedural cardiovascular examination: Secondary | ICD-10-CM | POA: Diagnosis not present

## 2018-07-07 DIAGNOSIS — Z01818 Encounter for other preprocedural examination: Secondary | ICD-10-CM | POA: Diagnosis not present

## 2018-07-07 HISTORY — DX: Cardiac arrhythmia, unspecified: I49.9

## 2018-07-07 HISTORY — DX: Fatty (change of) liver, not elsewhere classified: K76.0

## 2018-07-07 LAB — COMPREHENSIVE METABOLIC PANEL
ALT: 27 U/L (ref 0–44)
AST: 21 U/L (ref 15–41)
Albumin: 4 g/dL (ref 3.5–5.0)
Alkaline Phosphatase: 77 U/L (ref 38–126)
Anion gap: 10 (ref 5–15)
BUN: 13 mg/dL (ref 8–23)
CO2: 22 mmol/L (ref 22–32)
Calcium: 10 mg/dL (ref 8.9–10.3)
Chloride: 106 mmol/L (ref 98–111)
Creatinine, Ser: 0.69 mg/dL (ref 0.44–1.00)
GFR calc Af Amer: >60 mL/min (ref >60)
GFR calc non Af Amer: >60 mL/min (ref >60)
Glucose, Bld: 105 mg/dL — ABNORMAL HIGH (ref 70–99)
Potassium: 4.4 mmol/L (ref 3.5–5.1)
Sodium: 138 mmol/L (ref 135–145)
Total Bilirubin: 0.4 mg/dL (ref 0.3–1.2)
Total Protein: 6.7 g/dL (ref 6.5–8.1)

## 2018-07-07 LAB — CBC
HCT: 38.9 % (ref 36.0–46.0)
Hemoglobin: 13.1 g/dL (ref 12.0–15.0)
MCH: 31.3 pg (ref 26.0–34.0)
MCHC: 33.7 g/dL (ref 30.0–36.0)
MCV: 92.8 fL (ref 80.0–100.0)
Platelets: 241 10*3/uL (ref 150–400)
RBC: 4.19 MIL/uL (ref 3.87–5.11)
RDW: 13 % (ref 11.5–15.5)
WBC: 9 10*3/uL (ref 4.0–10.5)
nRBC: 0 % (ref 0.0–0.2)

## 2018-07-07 LAB — TYPE AND SCREEN
ABO/RH(D): A POS
Antibody Screen: NEGATIVE

## 2018-07-07 LAB — BLOOD GAS, ARTERIAL
Acid-Base Excess: 1.1 mmol/L (ref 0.0–2.0)
Bicarbonate: 25.1 mmol/L (ref 20.0–28.0)
Drawn by: 421801
FIO2: 21
O2 Saturation: 98.3 %
Patient temperature: 98.6
pCO2 arterial: 39.5 mmHg (ref 32.0–48.0)
pH, Arterial: 7.42 (ref 7.350–7.450)
pO2, Arterial: 109 mmHg — ABNORMAL HIGH (ref 83.0–108.0)

## 2018-07-07 LAB — URINALYSIS, ROUTINE W REFLEX MICROSCOPIC
Bilirubin Urine: NEGATIVE
Glucose, UA: NEGATIVE mg/dL
Hgb urine dipstick: NEGATIVE
Ketones, ur: NEGATIVE mg/dL
Leukocytes,Ua: NEGATIVE
Nitrite: NEGATIVE
Protein, ur: NEGATIVE mg/dL
Specific Gravity, Urine: 1.005 (ref 1.005–1.030)
pH: 7 (ref 5.0–8.0)

## 2018-07-07 LAB — SURGICAL PCR SCREEN
MRSA, PCR: NEGATIVE
Staphylococcus aureus: NEGATIVE

## 2018-07-07 LAB — HEMOGLOBIN A1C
Hgb A1c MFr Bld: 5.8 % — ABNORMAL HIGH (ref 4.8–5.6)
Mean Plasma Glucose: 119.76 mg/dL

## 2018-07-07 LAB — PROTIME-INR
INR: 1 (ref 0.8–1.2)
Prothrombin Time: 12.6 seconds (ref 11.4–15.2)

## 2018-07-07 LAB — BRAIN NATRIURETIC PEPTIDE: B Natriuretic Peptide: 63.7 pg/mL (ref 0.0–100.0)

## 2018-07-07 LAB — APTT: aPTT: 33 seconds (ref 24–36)

## 2018-07-07 LAB — ABO/RH: ABO/RH(D): A POS

## 2018-07-07 NOTE — Progress Notes (Signed)
Anesthesia Chart Review:  Case: [id number] Date/Time: 07/11/18 1045   Procedures:      TRANSCATHETER AORTIC VALVE REPLACEMENT, TRANSFEMORAL (N/A Chest)     TRANSESOPHAGEAL ECHOCARDIOGRAM (TEE) (N/A )   Anesthesia type: General   Pre-op diagnosis: Severe Aortic Stenosis   Location: [facility] OR ROOM 16 / [place] [facility]   Surgeon: Sherren Mocha, MD      DISCUSSION: Patient is a 68 year old female scheduled for the above procedure.  History includes smoking, possible bicuspid AV with severe aortic stenosis, dysrhythmia (not specified), HTN, fatty liver, varicose veins (s/p surgery). BMI is consistent with obesity.   Anesthesia team to evaluate on the day of surgery. She had itching after her TAVR CT scans. Since there is question of a contrast dye reaction, it looks like decision was made to premedicate her prior to TAVR.   07/07/18 presurgical COVID test is still in process.   VS: BP 133/67   Pulse 86   Temp (!) 36.3 C   Resp 20   Ht 5' 6.5" (1.689 m)   Wt 97.3 kg   SpO2 97%   BMI 34.12 kg/m   PROVIDERS: [name], MD is PCP [name], MD and Sherren Mocha, MD are cardiologists Gilford Raid, MD is CT surgeon   LABS: Labs reviewed: Acceptable for surgery. (all labs ordered are listed, but only abnormal results are displayed)  Labs Reviewed  BLOOD GAS, ARTERIAL - Abnormal; Notable for the following components:      Result Value   pO2, Arterial 109 (*)    All other components within normal limits  COMPREHENSIVE METABOLIC PANEL - Abnormal; Notable for the following components:   Glucose, Bld 105 (*)    All other components within normal limits  HEMOGLOBIN A1C - Abnormal; Notable for the following components:   Hgb A1c MFr Bld 5.8 (*)    All other components within normal limits  SURGICAL PCR SCREEN  APTT  BRAIN NATRIURETIC PEPTIDE  CBC  PROTIME-INR  URINALYSIS, ROUTINE W REFLEX MICROSCOPIC  TYPE AND SCREEN  ABO/RH    IMAGES: CXR 07/07/18:  IMPRESSION: No acute  abnormality seen.   EKG: 06/15/18: Sinus rhythm with Premature supraventricular complexes Minimal voltage criteria for LVH, may be normal variant Abnormal ECG No significant change since last tracing Confirmed by [name], [name] [phone]) on 06/15/2018 4:47:32 PM   CV: CT coronary 06/21/18: IMPRESSION: 1. Trileaflet aortic valve with severely calcified and thickened leaflets and moderately restricted leaflet openings. There are no calcifications extending into the LVOT. Annular measurements are suitable for delivery of a 26 mm Edwards-SAPIEN 3 valve. 2. Aortic valve calcium score is 1759 consistent with severe aortic stenosis. 3. Sufficient coronary to annulus distance. 4. Optimum Fluoroscopic Angle for Delivery: RAO 0 CRA 0. 5. No thrombus in the left atrial appendage.  Carotid US 06/21/18: - Mild technical difficulty due to high bifurcation and tortuosity Summary: - Right Carotid: Velocities in the right ICA are consistent with a 1-39% stenosis. - Left Carotid: Velocities in the left ICA are consistent with a 1-39% stenosis. - Vertebrals:  Bilateral vertebral arteries demonstrate antegrade flow. - Subclavians: Normal flow hemodynamics were seen in bilateral subclavian arteries.  Cardiac cath 06/15/18: Conclusion: 1. Known severe aortic stenosis by echo (mean gradient 60 mmHg) with heavily calcified and restricted aortic valve leaflets by fluoroscopy 2. Widely patent coronary arteries with no obstructive CAD 3. Normal right heart pressures with high cardiac output noted Plan: continue multidisciplinary evaluation for treatment of severe aortic stenosis  Echo 04/10/18: IMPRESSIONS  1. The left ventricle has normal systolic function with an ejection fraction of 60-65%. The cavity size was normal. Left ventricular diastolic Doppler parameters are consistent with impaired relaxation.  2. The right ventricle has normal systolic function. The cavity was normal. There is no increase in  right ventricular wall thickness.  3. Possible bicuspid aortic valve. Moderate thickening of the aortic valve Severe calcifcation of the aortic valve. Aortic valve regurgitation is trivial by color flow Doppler. severe stenosis of the aortic valve. Mean gradient of 59 mm Hg with valve  area of 0.73. AV Area (Vmean):   0.69 cm     AV Area (VTI):     0.79 cm      AV Vmax:           491.00 cm/s   AV Vmean:          368.000 cm/s  AV VTI:            1.037 m       AV Peak Grad:      96.4 mmHg AV Mean Grad:      60.5 mmHg LVOT Vmean:        100.000 cm/s LVOT VTI:          0.321 m LVOT/AV VTI ratio: 0.31 AR PHT:            360 msec  4. The mitral valve is grossly normal.  5. The tricuspid valve is grossly normal.   Past Medical History:  Diagnosis Date  . Allergic rhinitis   . Arthritis   . Dysrhythmia   . Fatty liver   . Heart murmur   . Hypertension   . Phlebitis   . RUQ pain 06/21/2017   From the NM Hepato w Eject Fract order  . Stress incontinence     Past Surgical History:  Procedure Laterality Date  . BREAST BIOPSY Left    ducts removed  . Cataract surgery    . COLONOSCOPY WITH PROPOFOL N/A 09/29/2017   Procedure: COLONOSCOPY WITH PROPOFOL;  Surgeon: Lin Landsman, MD;  Location: [facility] ENDOSCOPY;  Service: Gastroenterology;  Laterality: N/A;  . EYE SURGERY    . FOOT SURGERY     7  . KNEE ARTHROSCOPY Right   . LAPAROSCOPIC HYSTERECTOMY    . RIGHT HEART CATH AND CORONARY ANGIOGRAPHY N/A 06/15/2018   Procedure: RIGHT HEART CATH AND CORONARY ANGIOGRAPHY;  Surgeon: Sherren Mocha, MD;  Location: [facility];  Service: Cardiovascular;  Laterality: N/A;  . VARICOSE VEIN SURGERY      MEDICATIONS: . acetaminophen (TYLENOL) 650 MG CR tablet  . APPLE CIDER VINEGAR PO  . b complex vitamins tablet  . Calcium Carbonate-Vit D-Min (CALCIUM 1200 PO)  . cetirizine (ZYRTEC) 10 MG tablet  . fluticasone (FLONASE) 50 MCG/ACT nasal spray  . ibuprofen (ADVIL,MOTRIN) 200 MG  tablet  . Menthol, Topical Analgesic, (ICY HOT EX)  . metoprolol tartrate (LOPRESSOR) 50 MG tablet  . Multiple Vitamin (MULTIVITAMIN WITH MINERALS) TABS tablet  . nicotine (NICODERM CQ - DOSED IN MG/24 HOURS) 14 mg/24hr patch  . predniSONE (DELTASONE) 50 MG tablet  . Probiotic Product (PROBIOTIC ADVANCED PO)  . Propylene Glycol (SYSTANE COMPLETE OP)  . rosuvastatin (CRESTOR) 40 MG tablet  . sodium chloride (OCEAN) 0.65 % SOLN nasal spray   No current facility-administered medications for this encounter.     Myra Gianotti, PA-C Surgical Short Stay/Anesthesiology [facility] Phone [phone] [facility] Phone [phone] 07/07/2018 4:16 PM

## 2018-07-07 NOTE — Progress Notes (Signed)
  Coronavirus Screening  Scheduled for COVID test today at [facility] Have you experienced the following symptoms:  Cough yes/no: No Fever (>100.62F)  yes/no: No Runny nose yes/no: No Sore throat yes/no: No Difficulty breathing/shortness of breath  yes/no: No Loss of sense of smell or taste:No Have you or a family member traveled in the last 14 days and where? yes/no: No   PCP - Dr. [name]  Cardiologist - Dr. Rockey Situ, Dr. Burt Knack  Chest x-ray - 07-07-18 (ePIC)  EKG - 06-15-18  Stress Test - denies  ECHO - 04/10/18 (epic)  Cardiac Cath - 06/15/18 (epic)  AICD-denies PM-denies LOOP-denies  Sleep Study - denies CPAP - NA  LABS-PCR,CBC,CMP,PT-INR,PTT,UA,ABG,BNP,T/S  ASA-denies.  Instructed by Surgeon's office to take Prednisone at 2200 night before surgery & at 0400 DOS.  ERAS-NA  HA1C-NA Fasting Blood Sugar -  Checks Blood Sugar _o____ times a day  Anesthesia-Y. Cardiac history.  Pt denies having chest pain, sob,palpitations or fever at this time. All instructions explained to the pt, with a verbal understanding of the material. Pt agrees to go over the instructions while at home for a better understanding. The opportunity to ask questions was provided.

## 2018-07-07 NOTE — Anesthesia Preprocedure Evaluation (Addendum)
Anesthesia Evaluation  Patient identified by MRN, date of birth, ID band Patient awake    Reviewed: Allergy & Precautions, NPO status , Patient's Chart, lab work & pertinent test results  Airway Mallampati: II  TM Distance: >3 FB     Dental  (+) Dental Advisory Given   Pulmonary Current Smoker,    breath sounds clear to auscultation       Cardiovascular hypertension, Pt. on medications and Pt. on home beta blockers + Valvular Problems/Murmurs AS  Rhythm:Regular Rate:Normal     Neuro/Psych  Headaches,    GI/Hepatic Neg liver ROS, GERD  ,  Endo/Other  negative endocrine ROS  Renal/GU negative Renal ROS     Musculoskeletal  (+) Arthritis ,   Abdominal   Peds  Hematology negative hematology ROS (+)   Anesthesia Other Findings   Reproductive/Obstetrics                            Lab Results  Component Value Date   WBC 9.0 07/07/2018   HGB 13.1 07/07/2018   HCT 38.9 07/07/2018   MCV 92.8 07/07/2018   PLT 241 07/07/2018   Lab Results  Component Value Date   CREATININE 0.69 07/07/2018   BUN 13 07/07/2018   NA 138 07/07/2018   K 4.4 07/07/2018   CL 106 07/07/2018   CO2 22 07/07/2018    Anesthesia Physical Anesthesia Plan  ASA: IV  Anesthesia Plan: MAC   Post-op Pain Management:    Induction:   PONV Risk Score and Plan: 1 and Propofol infusion, Ondansetron and Treatment may vary due to age or medical condition  Airway Management Planned: Simple Face Mask and Natural Airway  Additional Equipment: Arterial line  Intra-op Plan:   Post-operative Plan:   Informed Consent: I have reviewed the patients History and Physical, chart, labs and discussed the procedure including the risks, benefits and alternatives for the proposed anesthesia with the patient or authorized representative who has indicated his/her understanding and acceptance.     Dental advisory given  Plan  Discussed with:   Anesthesia Plan Comments: ( )       Anesthesia Quick Evaluation

## 2018-07-08 LAB — NOVEL CORONAVIRUS, NAA (HOSP ORDER, SEND-OUT TO REF LAB; TAT 18-24 HRS): SARS-CoV-2, NAA: NOT DETECTED

## 2018-07-10 ENCOUNTER — Ambulatory Visit: Payer: Medicare Other | Admitting: Podiatry

## 2018-07-10 MED ORDER — SODIUM CHLORIDE 0.9 % IV SOLN
1.5000 g | INTRAVENOUS | Status: AC
  Administered 2018-07-11: 12:00:00 1.5 g via INTRAVENOUS
  Filled 2018-07-10: qty 1.5

## 2018-07-10 MED ORDER — DEXMEDETOMIDINE HCL IN NACL 400 MCG/100ML IV SOLN
0.1000 ug/kg/h | INTRAVENOUS | Status: DC
  Filled 2018-07-10: qty 100

## 2018-07-10 MED ORDER — MAGNESIUM SULFATE 50 % IJ SOLN
40.0000 meq | INTRAMUSCULAR | Status: DC
  Filled 2018-07-10: qty 9.85

## 2018-07-10 MED ORDER — POTASSIUM CHLORIDE 2 MEQ/ML IV SOLN
80.0000 meq | INTRAVENOUS | Status: DC
  Filled 2018-07-10: qty 40

## 2018-07-10 MED ORDER — SODIUM CHLORIDE 0.9 % IV SOLN
INTRAVENOUS | Status: DC
  Filled 2018-07-10: qty 30

## 2018-07-10 MED ORDER — NOREPINEPHRINE BITARTRATE 1 MG/ML IV SOLN
0.0000 ug/min | INTRAVENOUS | Status: DC
  Filled 2018-07-10: qty 4

## 2018-07-10 MED ORDER — VANCOMYCIN HCL 10 G IV SOLR
1500.0000 mg | INTRAVENOUS | Status: AC
  Administered 2018-07-11: 1500 mg via INTRAVENOUS
  Filled 2018-07-10: qty 1500

## 2018-07-10 NOTE — H&P (Signed)
[address]       [place] [address]             [phone]      Cardiothoracic Surgery Admission History and Physical   Referring Provider is Gollan, Kathlene November, MD  Primary Cardiologist is No primary care provider on file.  PCP is [name], MD      Chief Complaint  Patient presents with  . Aortic Stenosis       HPI:   The patient is a 68 year old woman with a history of hypertension, degenerative arthritis, and known bicuspid aortic valve with severe aortic stenosis by echocardiogram in May 2019. She tends to minimize her symptoms and is not that active due to limitation from arthritis. She says she does have mild shortness of breath with physical activity and gets fatigued but takes things slowly and does not feel like it changes her routine. She has had occasional episodes of chest discomfort but not necessarily related to exertion. She denies any dizziness or syncope. She has had no orthopnea or PND. She does have peripheral edema. She had a follow-up echocardiogram in March 2020 which showed progression of her aortic stenosis to critical with her mean aortic valve gradient increasing from 46 mm 1 year ago to 60 mm now. Her peak gradient is 96 mmHg. Aortic valve area was measured at 0.69 cm. She subsequently underwent cardiac catheterization on 06/15/2018 which showed widely patent coronary arteries with no obstructive coronary disease. Right heart pressures and cardiac output were normal.    She is married and lives with her husband. She continues to smoke about 10 cigarettes/day. She has smoked much more in the past but is trying to quit. She is using a nicotine patch. She has a history of alcohol abuse in the past but no longer drinks.       Past Medical History:  Diagnosis Date  . Allergic rhinitis   . Arthritis   . Heart murmur   . Hypertension   . Phlebitis   . RUQ pain 06/21/2017   From the NM Hepato w Eject Fract order  . Stress  incontinence         Past Surgical History:  Procedure Laterality Date  . BREAST BIOPSY Left    ducts removed  . Cataract surgery    . COLONOSCOPY WITH PROPOFOL N/A 09/29/2017   Procedure: COLONOSCOPY WITH PROPOFOL; Surgeon: Lin Landsman, MD; Location: [facility] ENDOSCOPY; Service: Gastroenterology; Laterality: N/A;  . EYE SURGERY    . FOOT SURGERY     7  . KNEE ARTHROSCOPY Right   . LAPAROSCOPIC HYSTERECTOMY    . RIGHT HEART CATH AND CORONARY ANGIOGRAPHY N/A 06/15/2018   Procedure: RIGHT HEART CATH AND CORONARY ANGIOGRAPHY; Surgeon: Sherren Mocha, MD; Location: [facility]; Service: Cardiovascular; Laterality: N/A;  . VARICOSE VEIN SURGERY          Family History  Problem Relation Age of Onset  . Alcoholism Other   . Arthritis Other   . Lung cancer Other   . Heart disease Other   . Stroke Other   . Hypertension Other   . Diabetes Other   . Heart failure Mother   . Hypertension Mother   . Diabetes Mother   . Heart failure Father   . Hypotension Father   . Breast cancer Neg Hx    Social History        Socioeconomic History  . Marital status: Married  Spouse name: Not on file  . Number of children: Not on file  . Years of education: Not on file  . Highest education level: Not on file  Occupational History  . Not on file  Social Needs  . Financial resource strain: Not hard at all  . Food insecurity:    Worry: Never true    Inability: Never true  . Transportation needs:    Medical: No    Non-medical: No  Tobacco Use  . Smoking status: Light Tobacco Smoker    Packs/day: 0.50    Years: 46.00    Pack years: 23.00    Types: Cigarettes  . Smokeless tobacco: Never Used  . Tobacco comment: stopped 06/11/2018...smokes 1-10 per day  Substance and Sexual Activity  . Alcohol use: Not Currently    Alcohol/week: 0.0 standard drinks  . Drug use: No  . Sexual activity: Not on file  Lifestyle  . Physical activity:    Days per week: 0 days    Minutes per  session: Not on file  . Stress: Not on file  Relationships  . Social connections:    Talks on phone: Not on file    Gets together: Not on file    Attends religious service: Not on file    Active member of club or organization: Not on file    Attends meetings of clubs or organizations: Not on file    Relationship status: Not on file  . Intimate partner violence:    Fear of current or ex partner: No    Emotionally abused: No    Physically abused: No    Forced sexual activity: No  Other Topics Concern  . Not on file  Social History Narrative  . Not on file         Current Outpatient Medications  Medication Sig Dispense Refill  . acetaminophen (TYLENOL) 650 MG CR tablet Take 650 mg by mouth every 8 (eight) hours as needed for fever.     . APPLE CIDER VINEGAR PO Take 1 tablet by mouth daily as needed (with fatty foods).     Marland Kitchen b complex vitamins tablet Take 1 tablet by mouth every other day.    . Calcium Carbonate-Vitamin D (CALCIUM 500 + D) 500-125 MG-UNIT TABS Take 1 tablet by mouth daily.     . cetirizine (ZYRTEC) 10 MG tablet Take 10 mg by mouth daily at 8 pm.     . fluticasone (FLONASE) 50 MCG/ACT nasal spray Place 1 spray into both nostrils daily as needed for allergies.     . Menthol, Topical Analgesic, (ICY HOT EX) Apply 1 application topically 4 (four) times daily as needed (muscle pain).     . metoprolol tartrate (LOPRESSOR) 50 MG tablet Take one tablet by mouth as directed on 06/21/18 prior to TAVR CT scans 1 tablet 0  . Multiple Vitamin (MULTIVITAMIN WITH MINERALS) TABS tablet Take 1 tablet by mouth daily. Women's Complete Multivitamin 50+    . naphazoline-glycerin (CLEAR EYES) 0.012-0.2 % SOLN Place 1 drop into both eyes 4 (four) times daily as needed (redness.).     Marland Kitchen nicotine (NICODERM CQ - DOSED IN MG/24 HOURS) 21 mg/24hr patch Place 10.5 mg onto the skin daily.    . predniSONE (STERAPRED UNI-PAK 21 TAB) 10 MG (21) TBPK tablet Take 1 tablet (10 mg total) by mouth daily.  Follow instructions: 60mg , 50mg , 40mg , 30mg , 20mg , 10mg  21 tablet 0  . Probiotic Product (PROBIOTIC ADVANCED PO) Take 1 tablet by mouth daily  as needed (for digestive health.).     Marland Kitchen Propylene Glycol (SYSTANE COMPLETE OP) Place 1 drop into both eyes 3 (three) times daily as needed (dry/irritated eyes.).     Marland Kitchen rosuvastatin (CRESTOR) 40 MG tablet Take 1 tablet (40 mg total) by mouth daily. 90 tablet 2  . sodium chloride (OCEAN) 0.65 % SOLN nasal spray Place 1 spray into both nostrils 4 (four) times daily as needed for congestion.     Marland Kitchen ibuprofen (ADVIL,MOTRIN) 200 MG tablet Take 400 mg by mouth at bedtime as needed (for pain).      No current facility-administered medications for this visit.         Allergies  Allergen Reactions  . Ibuprofen Other (See Comments)    High does causes stomach pains  . Asa [Aspirin] Other (See Comments)  . Iohexol Rash    Pt had reaction after 06/21/18 TAVR CT scans    Review of Systems:   General: normal appetite, +decreased energy, + weight gain, + weight loss, no fever  Cardiac: no chest pain with exertion, no chest pain at rest, +SOB with exertion, no resting SOB, no PND, no orthopnea, + palpitations, no arrhythmia, no atrial fibrillation, + LE edema, no dizzy spells, no syncope  Respiratory: + shortness of breath, no home oxygen, + productive cough, no dry cough, no bronchitis, no wheezing, no hemoptysis, no asthma, no pain with inspiration or cough, no sleep apnea, no CPAP at night  GI: no difficulty swallowing, no reflux, no frequent heartburn, no hiatal hernia, no abdominal pain, no constipation, no diarrhea, no hematochezia, no hematemesis, no melena  GU: no dysuria, + frequency, no urinary tract infection, no hematuria, no kidney stones, no kidney disease  Vascular: no pain suggestive of claudication, no pain in feet, + leg cramps, no varicose veins, no DVT, no non-healing foot ulcer  Neuro: no stroke, no TIA's, no seizures, + headaches, no temporary  blindness one eye, no slurred speech, + peripheral neuropathy, no chronic pain, no instability of gait, no memory/cognitive dysfunction  Musculoskeletal: + arthritis, no joint swelling, no myalgias, no difficulty walking, normal mobility  Skin: + rash, + itching, no skin infections, no pressure sores or ulcerations  Psych: + anxiety, no depression, + nervousness, no unusual recent stress  Eyes: + blurry vision, + floaters, no recent vision changes, does not wear glasses or contacts  ENT: no hearing loss, no loose or painful teeth, no dentures, last saw dentist 2018  Hematologic: no easy bruising, noabnormal bleeding, no clotting disorder, no frequent epistaxis  Endocrine: no diabetes, does not check CBG's at home    Physical Exam:   BP 140/74 (BP Location: Right Arm, Patient Position: Sitting, Cuff Size: Large)  Pulse 78  Temp (!) 97.5 F (36.4 C) (Skin)  Resp 16  Ht 5\' 8"  (1.727 m)  Wt 218 lb (98.9 kg)  SpO2 95% Comment: RA  BMI 33.15 kg/m  General: well-appearing  HEENT: Unremarkable, NCAT, PERLA, EOMI  Neck: no JVD, no bruits, no adenopathy or thyromegaly  Chest: clear to auscultation, symmetrical breath sounds, no wheezes, no rhonchi  CV: RRR, grade III/VI crescendo/decrescendo murmur heard best at RSB, no diastolic murmur  Abdomen: soft, non-tender, no masses  Extremities: warm, well-perfused, pulses palpable, mild LE edema in ankles  Rectal/GU Deferred  Neuro: Grossly non-focal and symmetrical throughout  Skin: Clean and dry, no rashes, no breakdown    Diagnostic Tests:   Patient Name: Donna Rios Date of Exam: 04/10/2018  Medical Rec #:  [id number] Height: 68.0 in  Accession #: [id number] Weight: 214.5 lb  Date of Birth: 05-Jul-1950 BSA: 2.11 m  Patient Age: 52 years BP: 140/80 mmHg  Patient Gender: F HR: 88 bpm.  Exam Location: [facility]  Procedure: 2D Echo, Cardiac Doppler and Color Doppler  Indications: R01.1 Murmur; I35.2 Nonrheumatic aortic (valve) stenosis  with  insufficiency  History: Patient has prior history of Echocardiogram examinations, most  recent 12/23/2016. Signs/Symptoms: Murmur; Risk Factors:  Dyslipidemia and Current Smoker.  Sonographer: Hester Mates BS, RVT, RDCS  Referring Phys: [place] Comments: Suboptimal parasternal window.  IMPRESSIONS  1. The left ventricle has normal systolic function with an ejection fraction of 60-65%. The cavity size was normal. Left ventricular diastolic Doppler parameters are consistent with impaired relaxation.  2. The right ventricle has normal systolic function. The cavity was normal. There is no increase in right ventricular wall thickness.  3. Possible bicuspid aortic valve. Moderate thickening of the aortic valve Severe calcifcation of the aortic valve. Aortic valve regurgitation is trivial by color flow Doppler. severe stenosis of the aortic valve. Mean gradient of 59 mm Hg with valve  area of 0.73 .  4. The mitral valve is grossly normal.  5. The tricuspid valve is grossly normal.  FINDINGS  Left Ventricle: The left ventricle has normal systolic function, with an ejection fraction of 60-65%. The cavity size was normal. There is no increase in left ventricular wall thickness. Left ventricular diastolic Doppler parameters are consistent with  impaired relaxation.  Right Ventricle: The right ventricle has normal systolic function. The cavity was normal. There is no increase in right ventricular wall thickness.  Left Atrium: left atrial size was normal in size  Right Atrium: right atrial size was normal in size. Right atrial pressure is estimated at 10 mmHg.  Interatrial Septum: No atrial level shunt detected by color flow Doppler.  Pericardium: There is no evidence of pericardial effusion.  Mitral Valve: The mitral valve is grossly normal. Mitral valve regurgitation was not assessed by color flow Doppler.  Tricuspid Valve: The tricuspid valve is grossly normal.  Tricuspid valve regurgitation was not visualized by color flow Doppler.  Aortic Valve: The aortic valve has an indeterminate number of cusps Moderate thickening of the aortic valve Severe calcifcation of the aortic valve. Aortic valve regurgitation is trivial by color flow Doppler. There is severe stenosis of the aortic  valve, with a calculated valve area of 0.79 cm.  Pulmonic Valve: The pulmonic valve was not well visualized. Pulmonic valve regurgitation was not assessed by color flow Doppler.  Venous: The inferior vena cava is normal in size with greater than 50% respiratory variability.  Compared to previous exam: Previous Echo showed LV EF 60-65%, no RWMA, severely calcified AoV leaflets, with mean gradient of 46 mmHg.  LEFT VENTRICLE  PLAX 2D  LVIDd: 5.20 cm Diastology  LVIDs: 2.90 cm LV e' lateral: 6.53 cm/s  LV PW: 1.10 cm LV E/e' lateral: 12.9  LV IVS: 1.00 cm LV e' medial: 5.77 cm/s  LVOT diam: 1.80 cm LV E/e' medial: 14.6  LV SV: 97 ml  LV SV Index: 44.37  LVOT Area: 2.54 cm  LV Volumes (MOD)  LV area d, A2C: 21.30 cm  LV area d, A4C: 23.70 cm  LV area s, A2C: 11.60 cm  LV area s, A4C: 13.10 cm  LV major d, A2C: 7.60 cm  LV major d, A4C: 7.53 cm  LV major s, A2C: 6.54 cm  LV major  s, A4C: 6.56 cm  LV vol d, MOD A2C: 51.1 ml  LV vol d, MOD A4C: 62.7 ml  LV vol s, MOD A2C: 17.8 ml  LV vol s, MOD A4C: 23.1 ml  LV SV MOD A2C: 33.3 ml  LV SV MOD A4C: 62.7 ml  LV SV MOD BP: 36.6 ml  RIGHT VENTRICLE  RV S prime: 12.60 cm/s  TAPSE (M-mode): 2.8 cm  RVSP: 20.5 mmHg  LEFT ATRIUM Index RIGHT ATRIUM Index  LA diam: 3.40 cm 1.61 cm/m RA Pressure: 10 mmHg  LA Vol (A2C): 59.9 ml 28.45 ml/m RA Area: 11.60 cm  LA Vol (A4C): 45.8 ml 21.75 ml/m RA Volume: 27.10 ml 12.87 ml/m  LA Biplane Vol: 55.9 ml 26.55 ml/m  AORTIC VALVE PULMONIC VALVE  AV Area (Vmean): 0.69 cm PV Vmax: 1.06 m/s  AV Area (VTI): 0.79 cm PV Vmean: 84.700 cm/s  AV Vmax: 491.00 cm/s PV VTI: 0.235 m   AV Vmean: 368.000 cm/s PV Peak grad: 4.5 mmHg  AV VTI: 1.037 m PV Mean grad: 3.0 mmHg  AV Peak Grad: 96.4 mmHg  AV Mean Grad: 60.5 mmHg  LVOT Vmean: 100.000 cm/s  LVOT VTI: 0.321 m  LVOT/AV VTI ratio: 0.31  AR PHT: 360 msec  AORTA  Ao Root diam: 3.20 cm  MITRAL VALVE TRICUSPID VALVE  MV Area (PHT): 2.99 cm TR Peak grad: 10.5 mmHg  MV PHT: 73.66 msec TR Vmax: 162.00 cm/s  MV Decel Time: 254 msec RVSP: 20.5 mmHg  MV E velocity: 84.50 cm/s  MV A velocity: 99.90 cm/s SHUNTS  MV E/A ratio: 0.85 Systemic VTI: 0.32 m  Systemic Diam: 1.80 cm  Kathlyn Sacramento MD  Electronically signed by Kathlyn Sacramento MD  Signature Date/Time: 04/10/2018/4:59:08 PM    Physicians  Panel Physicians Referring Physician Case Authorizing Physician  Sherren Mocha, MD (Primary)    Procedures  RIGHT HEART CATH AND CORONARY ANGIOGRAPHY  Conclusion  1. Known severe aortic stenosis by echo (mean gradient 60 mmHg) with heavily calcified and restricted aortic valve leaflets by fluoroscopy  2. Widely patent coronary arteries with no obstructive CAD  3. Normal right heart pressures with high cardiac output noted  Plan: continue multidisciplinary evaluation for treatment of severe aortic stenosis  Indications  Severe aortic stenosis [I35.0 (ICD-10-CM)]  Procedural Details  Technical Details INDICATION: Severe, symptomatic aortic stenosis.  PROCEDURAL DETAILS: There was an indwelling IV in a right antecubital vein. Using normal sterile technique, the IV was changed out for a 5 Fr brachial sheath over a 0.018 inch wire. The right wrist was then prepped, draped, and anesthetized with 1% lidocaine. Using the modified Seldinger technique a 5/6 French Slender sheath was placed in the right radial artery. Intra-arterial verapamil was administered through the radial artery sheath. IV heparin was administered after a JR4 catheter was advanced into the central aorta. A Swan-Ganz catheter was used for the right heart  catheterization. Standard protocol was followed for recording of right heart pressures and sampling of oxygen saturations. Fick cardiac output was calculated. Standard Judkins catheters were used for selective coronary angiography. No attempt was made at crossing the aortic valve. There were no immediate procedural complications. The patient was transferred to the post catheterization recovery area for further monitoring.    Estimated blood loss <50 mL.   During this procedure medications were administered to achieve and maintain moderate conscious sedation while the patient's heart rate, blood pressure, and oxygen saturation were continuously monitored and I was present face-to-face 100% of this time.  Medications  (Filter: Administrations occurring from 06/15/18 1415 to 06/15/18 1516)          Medication Rate/Dose/Volume Action  Date Time   midazolam (VERSED) injection (mg) 2 mg Given 06/15/18 1435   Total dose as of 06/30/18 1118        2 mg        fentaNYL (SUBLIMAZE) injection (mcg) 25 mcg Given 06/15/18 1435   Total dose as of 06/30/18 1118        25 mcg        lidocaine (PF) (XYLOCAINE) 1 % injection (mL) 2 mL Given 06/15/18 1445   Total dose as of 06/30/18 1118 2 mL Given 1447   4 mL        Heparin (Porcine) in NaCl 1000-0.9 UT/500ML-% SOLN (mL) 500 mL Given 06/15/18 1445   Total dose as of 06/30/18 1118 500 mL Given 1445   1,000 mL        Radial Cocktail/Verapamil only (mL) 10 mL Given 06/15/18 1448   Total dose as of 06/30/18 1118        10 mL        heparin injection (Units) 5,000 Units Given 06/15/18 1457   Total dose as of 06/30/18 1118        5,000 Units        iohexol (OMNIPAQUE) 350 MG/ML injection (mL) 55 mL Given 06/15/18 1510   Total dose as of 06/30/18 1118        55 mL        Sedation Time  Sedation Time Physician-1: 29 minutes 34 seconds  Coronary Findings  Diagnostic  Dominance: Right  Left Anterior Descending  Vessel is angiographically normal. Widely  patent LAD with no stenosis. 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 Vessel is angiographically normal.  Left Circumflex  First Obtuse Marginal Branch  Vessel is angiographically normal.  Right Coronary Artery  Vessel is large. Vessel is angiographically normal. Large, dominant RCA without stenosis. The PDA and PLA branches are widely patent.  Intervention  No interventions have been documented.  Left Heart  Aortic Valve The aortic valve is calcified. There is restricted aortic valve motion.  Coronary Diagrams  Diagnostic  Dominance: Right   Intervention  Implants     No implant documentation for this case.  Syngo Images  Link to Procedure Log   Show images for CARDIAC CATHETERIZATION Procedure Log  Images on Long Term Storage    Show images for Alannie, Amodio "[name]"   Hemo Data   Most Recent Value  Fick Cardiac Output 7.94 L/min  Fick Cardiac Output Index 3.76 (L/min)/BSA  RA A Wave 9 mmHg  RA V Wave 6 mmHg  RA Mean 5 mmHg  RV Systolic Pressure 29 mmHg  RV Diastolic Pressure 1 mmHg  RV EDP 5 mmHg  PA Systolic Pressure 26 mmHg  PA Diastolic Pressure 12 mmHg  PA Mean 18 mmHg  PW A Wave 11 mmHg  PW V Wave 7 mmHg  PW Mean 5 mmHg  AO Systolic Pressure 277 mmHg  AO Diastolic Pressure 76 mmHg  AO Mean 103 mmHg  QP/QS 1  TPVR Index 4.79 HRUI  TSVR Index 27.4 HRUI  PVR SVR Ratio 0.13  TPVR/TSVR Ratio 0.17     ADDENDUM REPORT: 06/21/2018 17:08  CLINICAL DATA: 68 year old female with suspected bicuspid aortic  valve and severe aortic stenosis being evaluated for a TAVR  procedure.  EXAM:  Cardiac TAVR CT  TECHNIQUE:  The patient was scanned on a [employer]. A 120 kV  retrospective scan was triggered in the descending thoracic aorta at  111 HU's. Gantry rotation speed was 250 msecs and collimation was .6  mm. No beta blockade or nitro were given. The 3D data set was  reconstructed in  5% intervals of the R-R cycle. Systolic and  diastolic phases were analyzed on a dedicated work station using  MPR, MIP and VRT modes. The patient received 80 cc of contrast.  FINDINGS:  Aortic Valve: Trileaflet aortic valve with severely calcified and  thickened leaflets and moderately restricted leaflet openings. There  are no calcifications extending into the LVOT.  Aorta: Normal size with minimal calcifications and atherosclerotic  plaque and no dissection.  Sinotubular Junction: 25 x 25 mm  Ascending Thoracic Aorta: 35 x 35 mm  Aortic Arch: 25 x 25 mm  Descending Thoracic Aorta: 27 x 26 mm  Sinus of Valsalva Measurements:  Non-coronary: 31 mm  Right -coronary: 31 mm  Left -coronary: 32 mm  Coronary Artery Height above Annulus:  Left Main: 17 mm  Right Coronary: 18 mm  Virtual Basal Annulus Measurements:  Maximum/Minimum Diameter: 27.4 x 23.4 mm  Mean Diameter: 24.5 mm  Perimeter: 79.6 mm  Area: 472 mm2  Optimum Fluoroscopic Angle for Delivery: RAO 0 CRA 0.  IMPRESSION:  1. Trileaflet aortic valve with severely calcified and thickened  leaflets and moderately restricted leaflet openings. There are no  calcifications extending into the LVOT. Annular measurements are  suitable for delivery of a 26 mm Edwards-SAPIEN 3 valve.  2. Aortic valve calcium score is 1759 consistent with severe aortic  stenosis.  3. Sufficient coronary to annulus distance.  4. Optimum Fluoroscopic Angle for Delivery: RAO 0 CRA 0.  5. No thrombus in the left atrial appendage.  Electronically Signed  By: Ena Dawley  On: 06/21/2018 17:08   Addended by Dorothy Spark, MD on 06/21/2018 5:11 PM  Study Result   EXAM:  OVER-READ INTERPRETATION CT CHEST  The following report is an over-read performed by radiologist Dr.  Vinnie Langton of [facility], [place] on 06/21/2018. This  over-read does not include interpretation of cardiac or coronary  anatomy or pathology. The coronary calcium  score/coronary CTA  interpretation by the cardiologist is attached.  COMPARISON: CT the abdomen and pelvis 01/03/2018. Chest CT  06/21/2017.  FINDINGS:  Extracardiac findings will be described separately under dictation  for contemporaneously obtained CTA chest, abdomen and pelvis.  IMPRESSION:  Please see separate dictation for contemporaneously obtained CTA  chest, abdomen and pelvis dated 06/21/2018 for full description of  relevant extracardiac findings.  Electronically Signed:  By: Vinnie Langton M.D.  On: 06/21/2018 12:10   CLINICAL DATA: 68 year old female with history of severe aortic  stenosis. Preprocedural study prior to potential transcatheter  aortic valve replacement (TAVR) procedure.  EXAM:  CT ANGIOGRAPHY CHEST, ABDOMEN AND PELVIS  TECHNIQUE:  Multidetector CT imaging through the chest, abdomen and pelvis was  performed using the standard protocol during bolus administration of  intravenous contrast. Multiplanar reconstructed images and MIPs were  obtained and reviewed to evaluate the vascular anatomy.  CONTRAST: 152mL OMNIPAQUE IOHEXOL 350 MG/ML SOLN  COMPARISON: CT the abdomen and pelvis 01/03/2018. Low-dose lung  cancer screening chest CT 06/21/2017.  FINDINGS:  CTA CHEST FINDINGS  Cardiovascular: Heart size is borderline enlarged. There is no  significant pericardial fluid, thickening or pericardial  calcification. Aortic atherosclerosis. No definite  coronary artery  calcifications. Severe thickening calcification of the aortic valve.  Mediastinum/Lymph Nodes: No pathologically enlarged no suspicious  appearing pulmonary nodules or masses. No acute consolidative  airspace disease. No pleural effusions. Lymph nodes. Esophagus is  unremarkable in appearance. No axillary lymphadenopathy.  Lungs/Pleura: No suspicious appearing pulmonary nodules or masses.  No acute consolidative airspace disease. No pleural effusions.  Musculoskeletal/Soft Tissues: There are no  aggressive appearing  lytic or blastic lesions noted in the visualized portions of the  skeleton.  CTA ABDOMEN AND PELVIS FINDINGS  Hepatobiliary: No suspicious cystic or solid hepatic lesions. No  intra or extrahepatic biliary ductal dilatation. Gallbladder is  normal in appearance.  Pancreas: No pancreatic mass. No pancreatic ductal dilatation. No  pancreatic or peripancreatic fluid or inflammatory changes.  Spleen: Unremarkable.  Adrenals/Urinary Tract: Bilateral kidneys and bilateral adrenal  glands are normal in appearance. No hydroureteronephrosis. Urinary  bladder is normal in appearance.  Stomach/Bowel: Normal appearance of the stomach. No pathologic  dilatation of small bowel or colon. Normal appendix.  Vascular/Lymphatic: Vascular findings and measurements pertinent to  potential TAVR procedure, as detailed below. Fusiform ectasia of the  infrarenal abdominal aorta measuring up to 2.4 x 2.4 cm. No aneurysm  or dissection noted in the abdominal or pelvic vasculature. No  lymphadenopathy noted in the abdomen or pelvis.  Reproductive: Status post hysterectomy. Ovaries are not confidently  identified may be surgically absent or atrophic.  Other: No significant volume of ascites. No pneumoperitoneum.  Musculoskeletal: There are no aggressive appearing lytic or blastic  lesions noted in the visualized portions of the skeleton.  VASCULAR MEASUREMENTS PERTINENT TO TAVR:  AORTA:  Minimal Aortic Diameter-13 x 18 mm  Severity of Aortic Calcification-moderate  RIGHT PELVIS:  Right Common Iliac Artery -  Minimal Diameter-8.9 x 9.3 mm  Tortuosity-mild  Calcification-moderate  Right External Iliac Artery -  Minimal Diameter-6.4 x 5.9 mm  Tortuosity-severe  Calcification-none  Right Common Femoral Artery -  Minimal Diameter-6.6 x 6.3 mm  Tortuosity-mild  Calcification-mild  LEFT PELVIS:  Left Common Iliac Artery -  Minimal Diameter-9.8 x 8.5 mm  Tortuosity-mild   Calcification-mild-to-moderate  Left External Iliac Artery -  Minimal Diameter-6.5 x 6.1 mm  Tortuosity-moderate  Calcification-minimal  Left Common Femoral Artery -  Minimal Diameter-7.0 x 6.6 mm  Tortuosity-mild  Calcification-mild  Review of the MIP images confirms the above findings.  IMPRESSION:  1. Vascular findings and measurements pertinent to potential TAVR  procedure, as detailed above.  2. Severe thickening calcification of the aortic valve, compatible  with the reported clinical history of severe aortic stenosis.  3. Additional incidental findings, as above.  Electronically Signed  By: Vinnie Langton M.D.  On: 06/21/2018 14:10    STS Risk Calculator (Isolated AVR):   Risk of Mortality:  1.041%  Renal Failure:  1.109%  Permanent Stroke:  0.818%  Prolonged Ventilation:  3.745%  DSW Infection:  0.096%  Reoperation:  2.176%  Morbidity or Mortality:  6.441%  Short Length of Stay:  51.827%  Long Length of Stay:  2.275%    Impression:   This 68 year old woman has stage D, critical, symptomatic aortic stenosis with New York Heart Association class II symptoms of exertional fatigue and shortness of breath consistent with chronic diastolic congestive heart failure. I have personally reviewed her 2D echocardiogram, cardiac catheterization, and CTA studies. Her most recent echocardiogram shows a severely calcified aortic valve with markedly decreased leaflet excursion. Is not possible to tell if this is a bicuspid or trileaflet valve.  The mean aortic valve gradient of 60 mmHg consistent with critical aortic stenosis. Left ventricular systolic function is normal with ejection fraction of 60 to 65%. Her cardiac catheterization shows no significant coronary disease with normal right heart pressures. I agree that aortic valve replacement is indicated in this patient with critical aortic stenosis who has had a significant increase in her mean gradient over the past year.   The patient was counseled at length regarding treatment alternatives for management of severe symptomatic aortic stenosis. The risks and benefits of surgical intervention has been discussed in detail. Long-term prognosis with medical therapy was discussed. Alternative approaches such as conventional surgical aortic valve replacement, transcatheter aortic valve replacement, and palliative medical therapy were compared and contrasted at length. This discussion was placed in the context of the patient's own specific clinical presentation and past medical history. All of her questions have been addressed. She would rather proceed with transcatheter aortic valve replacement due to her degenerative arthritis and reduced mobility. I think that is a reasonable alternative for her. Her gated cardiac CTA shows anatomy suitable for transcatheter aortic valve replacement. Her abdominal pelvic CTA shows adequate pelvic vascular anatomy to allow transfemoral insertion.  Following the decision to proceed with transcatheter aortic valve replacement, a discussion was held regarding what types of management strategies would be attempted intraoperatively in the event of life-threatening complications, including whether or not the patient would be considered a candidate for the use of cardiopulmonary bypass and/or conversion to open sternotomy for attempted surgical intervention. Her surgical risk for open AVR is low and I think she would certainly be a candidate for emergent sternotomy if needed to manage any intraoperative complications.  The patient has been advised of a variety of complications that might develop including but not limited to risks of death, stroke, paravalvular leak, aortic dissection or other major vascular complications, aortic annulus rupture, device embolization, cardiac rupture or perforation, mitral regurgitation, acute myocardial infarction, arrhythmia, heart block or bradycardia requiring permanent  pacemaker placement, congestive heart failure, respiratory failure, renal failure, pneumonia, infection, other late complications related to structural valve deterioration or migration, or other complications that might ultimately cause a temporary or permanent loss of functional independence or other long term morbidity. The patient provides full informed consent for the procedure as described and all questions were answered.    Plan:   Trans-femoral transcatheter aortic valve placement    Gaye Pollack, MD

## 2018-07-11 ENCOUNTER — Inpatient Hospital Stay (HOSPITAL_COMMUNITY): Payer: Medicare Other

## 2018-07-11 ENCOUNTER — Other Ambulatory Visit: Payer: Self-pay

## 2018-07-11 ENCOUNTER — Inpatient Hospital Stay (HOSPITAL_COMMUNITY)
Admission: RE | Admit: 2018-07-11 | Discharge: 2018-07-12 | DRG: 267 | Disposition: A | Discharge status: Home / Self Care | Payer: Medicare Other | Attending: Surgery | Admitting: Surgery

## 2018-07-11 ENCOUNTER — Inpatient Hospital Stay (HOSPITAL_COMMUNITY): Payer: Medicare Other | Admitting: Certified Registered Nurse Anesthetist

## 2018-07-11 ENCOUNTER — Encounter (HOSPITAL_COMMUNITY): Payer: Self-pay

## 2018-07-11 ENCOUNTER — Inpatient Hospital Stay (HOSPITAL_COMMUNITY): Payer: Medicare Other | Admitting: Vascular Surgery

## 2018-07-11 ENCOUNTER — Encounter (HOSPITAL_COMMUNITY)
Admission: RE | Disposition: A | Discharge status: Home / Self Care | Payer: Self-pay | Source: Home / Self Care | Attending: Surgery

## 2018-07-11 DIAGNOSIS — F17210 Nicotine dependence, cigarettes, uncomplicated: Secondary | ICD-10-CM | POA: Diagnosis present

## 2018-07-11 DIAGNOSIS — I352 Nonrheumatic aortic (valve) stenosis with insufficiency: Secondary | ICD-10-CM | POA: Diagnosis not present

## 2018-07-11 DIAGNOSIS — E785 Hyperlipidemia, unspecified: Secondary | ICD-10-CM | POA: Diagnosis present

## 2018-07-11 DIAGNOSIS — Z006 Encounter for examination for normal comparison and control in clinical research program: Secondary | ICD-10-CM | POA: Diagnosis not present

## 2018-07-11 DIAGNOSIS — Z952 Presence of prosthetic heart valve: Secondary | ICD-10-CM

## 2018-07-11 DIAGNOSIS — I10 Essential (primary) hypertension: Secondary | ICD-10-CM | POA: Diagnosis not present

## 2018-07-11 DIAGNOSIS — K219 Gastro-esophageal reflux disease without esophagitis: Secondary | ICD-10-CM | POA: Diagnosis present

## 2018-07-11 DIAGNOSIS — R7303 Prediabetes: Secondary | ICD-10-CM | POA: Diagnosis not present

## 2018-07-11 DIAGNOSIS — Z833 Family history of diabetes mellitus: Secondary | ICD-10-CM

## 2018-07-11 DIAGNOSIS — I350 Nonrheumatic aortic (valve) stenosis: Secondary | ICD-10-CM | POA: Diagnosis not present

## 2018-07-11 DIAGNOSIS — Z87891 Personal history of nicotine dependence: Secondary | ICD-10-CM | POA: Diagnosis present

## 2018-07-11 DIAGNOSIS — J309 Allergic rhinitis, unspecified: Secondary | ICD-10-CM | POA: Diagnosis present

## 2018-07-11 DIAGNOSIS — M1990 Unspecified osteoarthritis, unspecified site: Secondary | ICD-10-CM | POA: Diagnosis not present

## 2018-07-11 DIAGNOSIS — Z954 Presence of other heart-valve replacement: Secondary | ICD-10-CM | POA: Diagnosis not present

## 2018-07-11 DIAGNOSIS — Z8672 Personal history of thrombophlebitis: Secondary | ICD-10-CM

## 2018-07-11 DIAGNOSIS — Z8249 Family history of ischemic heart disease and other diseases of the circulatory system: Secondary | ICD-10-CM | POA: Diagnosis not present

## 2018-07-11 HISTORY — DX: Tobacco use: Z72.0

## 2018-07-11 HISTORY — DX: Presence of prosthetic heart valve: Z95.2

## 2018-07-11 HISTORY — PX: INTRAOPERATIVE TRANSTHORACIC ECHOCARDIOGRAM: SHX6523

## 2018-07-11 HISTORY — DX: Nonrheumatic aortic (valve) stenosis: I35.0

## 2018-07-11 HISTORY — PX: TRANSCATHETER AORTIC VALVE REPLACEMENT, TRANSFEMORAL: SHX6400

## 2018-07-11 LAB — POCT I-STAT EG7
Acid-base deficit: 3 mmol/L — ABNORMAL HIGH (ref 0.0–2.0)
Bicarbonate: 22.8 mmol/L (ref 20.0–28.0)
Calcium, Ion: 1.24 mmol/L (ref 1.15–1.40)
HCT: 35 % — ABNORMAL LOW (ref 36.0–46.0)
Hemoglobin: 11.9 g/dL — ABNORMAL LOW (ref 12.0–15.0)
O2 Saturation: 82 %
Potassium: 4 mmol/L (ref 3.5–5.1)
Sodium: 143 mmol/L (ref 135–145)
TCO2: 24 mmol/L (ref 22–32)
pCO2, Ven: 45.2 mmHg (ref 44.0–60.0)
pH, Ven: 7.311 (ref 7.250–7.430)
pO2, Ven: 51 mmHg — ABNORMAL HIGH (ref 32.0–45.0)

## 2018-07-11 LAB — POCT I-STAT 7, (LYTES, BLD GAS, ICA,H+H)
Acid-base deficit: 4 mmol/L — ABNORMAL HIGH (ref 0.0–2.0)
Bicarbonate: 22.4 mmol/L (ref 20.0–28.0)
Calcium, Ion: 1.28 mmol/L (ref 1.15–1.40)
HCT: 32 % — ABNORMAL LOW (ref 36.0–46.0)
Hemoglobin: 10.9 g/dL — ABNORMAL LOW (ref 12.0–15.0)
O2 Saturation: 99 %
Potassium: 4 mmol/L (ref 3.5–5.1)
Sodium: 144 mmol/L (ref 135–145)
TCO2: 24 mmol/L (ref 22–32)
pCO2 arterial: 45.5 mmHg (ref 32.0–48.0)
pH, Arterial: 7.301 — ABNORMAL LOW (ref 7.350–7.450)
pO2, Arterial: 145 mmHg — ABNORMAL HIGH (ref 83.0–108.0)

## 2018-07-11 LAB — POCT I-STAT 4, (NA,K, GLUC, HGB,HCT)
Glucose, Bld: 142 mg/dL — ABNORMAL HIGH (ref 70–99)
Glucose, Bld: 163 mg/dL — ABNORMAL HIGH (ref 70–99)
Glucose, Bld: 195 mg/dL — ABNORMAL HIGH (ref 70–99)
HCT: 31 % — ABNORMAL LOW (ref 36.0–46.0)
HCT: 31 % — ABNORMAL LOW (ref 36.0–46.0)
HCT: 32 % — ABNORMAL LOW (ref 36.0–46.0)
Hemoglobin: 10.5 g/dL — ABNORMAL LOW (ref 12.0–15.0)
Hemoglobin: 10.5 g/dL — ABNORMAL LOW (ref 12.0–15.0)
Hemoglobin: 10.9 g/dL — ABNORMAL LOW (ref 12.0–15.0)
Potassium: 3.9 mmol/L (ref 3.5–5.1)
Potassium: 4 mmol/L (ref 3.5–5.1)
Potassium: 4.1 mmol/L (ref 3.5–5.1)
Sodium: 143 mmol/L (ref 135–145)
Sodium: 143 mmol/L (ref 135–145)
Sodium: 143 mmol/L (ref 135–145)

## 2018-07-11 LAB — POCT I-STAT CREATININE: Creatinine, Ser: 0.5 mg/dL (ref 0.44–1.00)

## 2018-07-11 SURGERY — IMPLANTATION, AORTIC VALVE, TRANSCATHETER, FEMORAL APPROACH
Anesthesia: Monitor Anesthesia Care | Site: Chest

## 2018-07-11 MED ORDER — 0.9 % SODIUM CHLORIDE (POUR BTL) OPTIME
TOPICAL | Status: DC | PRN
  Administered 2018-07-11: 1000 mL

## 2018-07-11 MED ORDER — NICOTINE 14 MG/24HR TD PT24
14.0000 mg | MEDICATED_PATCH | Freq: Every day | TRANSDERMAL | Status: DC
  Administered 2018-07-11 – 2018-07-12 (×2): 14 mg via TRANSDERMAL
  Filled 2018-07-11 (×2): qty 1

## 2018-07-11 MED ORDER — ONDANSETRON HCL 4 MG/2ML IJ SOLN
INTRAMUSCULAR | Status: DC | PRN
  Administered 2018-07-11: 4 mg via INTRAVENOUS

## 2018-07-11 MED ORDER — MORPHINE SULFATE (PF) 2 MG/ML IV SOLN: 1.0000 mg | INTRAVENOUS | Status: DC | PRN

## 2018-07-11 MED ORDER — LACTATED RINGERS IV SOLN: INTRAVENOUS | Status: DC | PRN

## 2018-07-11 MED ORDER — CLOPIDOGREL BISULFATE 75 MG PO TABS
75.0000 mg | ORAL_TABLET | Freq: Every day | ORAL | Status: DC
  Administered 2018-07-12: 75 mg via ORAL
  Filled 2018-07-11: qty 1

## 2018-07-11 MED ORDER — ACETAMINOPHEN 325 MG PO TABS
650.0000 mg | ORAL_TABLET | Freq: Four times a day (QID) | ORAL | Status: DC | PRN
  Administered 2018-07-11 – 2018-07-12 (×2): 650 mg via ORAL
  Filled 2018-07-11 (×2): qty 2

## 2018-07-11 MED ORDER — LABETALOL HCL 5 MG/ML IV SOLN
INTRAVENOUS | Status: DC | PRN
  Administered 2018-07-11: 5 mg via INTRAVENOUS

## 2018-07-11 MED ORDER — DEXMEDETOMIDINE HCL IN NACL 400 MCG/100ML IV SOLN
INTRAVENOUS | Status: DC | PRN
  Administered 2018-07-11: 1 ug/kg/h via INTRAVENOUS

## 2018-07-11 MED ORDER — NITROGLYCERIN IN D5W 200-5 MCG/ML-% IV SOLN: 0.0000 ug/min | INTRAVENOUS | Status: DC

## 2018-07-11 MED ORDER — SODIUM CHLORIDE 0.9% FLUSH: 3.0000 mL | INTRAVENOUS | Status: DC | PRN

## 2018-07-11 MED ORDER — DEXMEDETOMIDINE HCL IN NACL 400 MCG/100ML IV SOLN
INTRAVENOUS | Status: DC | PRN
  Administered 2018-07-11: 97.12 ug via INTRAVENOUS

## 2018-07-11 MED ORDER — SODIUM CHLORIDE 0.9 % IV SOLN
INTRAVENOUS | Status: DC | PRN
  Administered 2018-07-11: 11:00:00 via INTRAVENOUS

## 2018-07-11 MED ORDER — CHLORHEXIDINE GLUCONATE 0.12 % MT SOLN
OROMUCOSAL | Status: AC
  Administered 2018-07-11: 09:00:00 15 mL via OROMUCOSAL
  Filled 2018-07-11: qty 15

## 2018-07-11 MED ORDER — LIDOCAINE HCL (PF) 1 % IJ SOLN
INTRAMUSCULAR | Status: AC
  Filled 2018-07-11: qty 30

## 2018-07-11 MED ORDER — PROTAMINE SULFATE 10 MG/ML IV SOLN
INTRAVENOUS | Status: DC | PRN
  Administered 2018-07-11: 10 mg via INTRAVENOUS
  Administered 2018-07-11: 140 mg via INTRAVENOUS

## 2018-07-11 MED ORDER — FENTANYL CITRATE (PF) 100 MCG/2ML IJ SOLN
INTRAMUSCULAR | Status: DC | PRN
  Administered 2018-07-11: 100 ug via INTRAVENOUS

## 2018-07-11 MED ORDER — CHLORHEXIDINE GLUCONATE 0.12 % MT SOLN
15.0000 mL | Freq: Once | OROMUCOSAL | Status: AC
  Administered 2018-07-11: 09:00:00 15 mL via OROMUCOSAL

## 2018-07-11 MED ORDER — ESMOLOL HCL 100 MG/10ML IV SOLN
INTRAVENOUS | Status: DC | PRN
  Administered 2018-07-11 (×2): 20 mg via INTRAVENOUS

## 2018-07-11 MED ORDER — LORATADINE 10 MG PO TABS
10.0000 mg | ORAL_TABLET | Freq: Every day | ORAL | Status: DC
  Administered 2018-07-12: 08:00:00 10 mg via ORAL
  Filled 2018-07-11: qty 1

## 2018-07-11 MED ORDER — LABETALOL HCL 5 MG/ML IV SOLN
INTRAVENOUS | Status: AC
  Filled 2018-07-11: qty 4

## 2018-07-11 MED ORDER — OXYCODONE HCL 5 MG PO TABS: 5.0000 mg | ORAL_TABLET | ORAL | Status: DC | PRN

## 2018-07-11 MED ORDER — NITROGLYCERIN 0.2 MG/ML ON CALL CATH LAB
INTRAVENOUS | Status: DC | PRN
  Administered 2018-07-11 (×2): 40 ug via INTRAVENOUS

## 2018-07-11 MED ORDER — ASPIRIN EC 81 MG PO TBEC
81.0000 mg | DELAYED_RELEASE_TABLET | Freq: Every day | ORAL | Status: DC
  Administered 2018-07-12: 81 mg via ORAL
  Filled 2018-07-11: qty 1

## 2018-07-11 MED ORDER — SODIUM CHLORIDE 0.9 % IV SOLN
1.5000 g | Freq: Two times a day (BID) | INTRAVENOUS | Status: DC
  Administered 2018-07-11 – 2018-07-12 (×2): 1.5 g via INTRAVENOUS
  Filled 2018-07-11 (×3): qty 1.5

## 2018-07-11 MED ORDER — AMIODARONE IV BOLUS ONLY 150 MG/100ML
150.0000 mg | Freq: Once | INTRAVENOUS | Status: DC
  Filled 2018-07-11 (×2): qty 100

## 2018-07-11 MED ORDER — AMIODARONE IV BOLUS ONLY 150 MG/100ML
INTRAVENOUS | Status: DC | PRN
  Administered 2018-07-11: 150 mg via INTRAVENOUS

## 2018-07-11 MED ORDER — VANCOMYCIN HCL IN DEXTROSE 1-5 GM/200ML-% IV SOLN
1000.0000 mg | Freq: Once | INTRAVENOUS | Status: AC
  Administered 2018-07-11: 1000 mg via INTRAVENOUS
  Filled 2018-07-11: qty 200

## 2018-07-11 MED ORDER — HEPARIN SODIUM (PORCINE) 1000 UNIT/ML IJ SOLN
INTRAMUSCULAR | Status: DC | PRN
  Administered 2018-07-11: 15000 [IU] via INTRAVENOUS

## 2018-07-11 MED ORDER — MIDAZOLAM HCL 2 MG/2ML IJ SOLN
INTRAMUSCULAR | Status: AC
  Filled 2018-07-11: qty 2

## 2018-07-11 MED ORDER — ONDANSETRON HCL 4 MG/2ML IJ SOLN
INTRAMUSCULAR | Status: AC
  Filled 2018-07-11: qty 2

## 2018-07-11 MED ORDER — MIDAZOLAM HCL 5 MG/5ML IJ SOLN
INTRAMUSCULAR | Status: DC | PRN
  Administered 2018-07-11: 2 mg via INTRAVENOUS

## 2018-07-11 MED ORDER — ROSUVASTATIN CALCIUM 20 MG PO TABS
40.0000 mg | ORAL_TABLET | Freq: Every day | ORAL | Status: DC
  Administered 2018-07-11: 40 mg via ORAL
  Filled 2018-07-11: qty 2

## 2018-07-11 MED ORDER — LIDOCAINE HCL 1 % IJ SOLN
INTRAMUSCULAR | Status: DC | PRN
  Administered 2018-07-11: 10 mL

## 2018-07-11 MED ORDER — PROPOFOL 500 MG/50ML IV EMUL
INTRAVENOUS | Status: DC | PRN
  Administered 2018-07-11: 10 ug/kg/min via INTRAVENOUS

## 2018-07-11 MED ORDER — ALPRAZOLAM 0.25 MG PO TABS
0.2500 mg | ORAL_TABLET | Freq: Three times a day (TID) | ORAL | Status: DC | PRN
  Administered 2018-07-11 – 2018-07-12 (×3): 0.25 mg via ORAL
  Filled 2018-07-11 (×3): qty 1

## 2018-07-11 MED ORDER — CHLORHEXIDINE GLUCONATE 4 % EX LIQD: 60.0000 mL | Freq: Once | CUTANEOUS | Status: DC

## 2018-07-11 MED ORDER — SODIUM CHLORIDE 0.9 % IV SOLN
INTRAVENOUS | Status: DC
  Administered 2018-07-11 (×2): via INTRAVENOUS

## 2018-07-11 MED ORDER — SODIUM CHLORIDE 0.9 % IV SOLN
INTRAVENOUS | Status: AC
  Filled 2018-07-11 (×3): qty 1.2

## 2018-07-11 MED ORDER — FENTANYL CITRATE (PF) 250 MCG/5ML IJ SOLN
INTRAMUSCULAR | Status: AC
  Filled 2018-07-11: qty 5

## 2018-07-11 MED ORDER — PHENYLEPHRINE HCL-NACL 20-0.9 MG/250ML-% IV SOLN
0.0000 ug/min | INTRAVENOUS | Status: DC
  Filled 2018-07-11: qty 250

## 2018-07-11 MED ORDER — SODIUM CHLORIDE 0.9 % IV SOLN: 250.0000 mL | INTRAVENOUS | Status: DC | PRN

## 2018-07-11 MED ORDER — CHLORHEXIDINE GLUCONATE 4 % EX LIQD: 30.0000 mL | CUTANEOUS | Status: DC

## 2018-07-11 MED ORDER — PROPOFOL 10 MG/ML IV BOLUS
INTRAVENOUS | Status: AC
  Filled 2018-07-11: qty 20

## 2018-07-11 MED ORDER — ACETAMINOPHEN 650 MG RE SUPP: 650.0000 mg | Freq: Four times a day (QID) | RECTAL | Status: DC | PRN

## 2018-07-11 MED ORDER — SODIUM CHLORIDE 0.9 % IV SOLN
INTRAVENOUS | Status: DC
  Administered 2018-07-11: 50 mL/h via INTRAVENOUS

## 2018-07-11 MED ORDER — PREDNISONE 20 MG PO TABS
50.0000 mg | ORAL_TABLET | Freq: Once | ORAL | Status: AC
  Administered 2018-07-11: 09:00:00 50 mg via ORAL
  Filled 2018-07-11: qty 3

## 2018-07-11 MED ORDER — ONDANSETRON HCL 4 MG/2ML IJ SOLN
4.0000 mg | Freq: Four times a day (QID) | INTRAMUSCULAR | Status: DC | PRN
  Administered 2018-07-11: 4 mg via INTRAVENOUS
  Filled 2018-07-11: qty 2

## 2018-07-11 MED ORDER — DIPHENHYDRAMINE HCL 25 MG PO CAPS
50.0000 mg | ORAL_CAPSULE | Freq: Once | ORAL | Status: AC
  Administered 2018-07-11: 50 mg via ORAL
  Filled 2018-07-11: qty 2

## 2018-07-11 MED ORDER — TRAMADOL HCL 50 MG PO TABS: 50.0000 mg | ORAL_TABLET | ORAL | Status: DC | PRN

## 2018-07-11 MED ORDER — SODIUM CHLORIDE 0.9% FLUSH
3.0000 mL | Freq: Two times a day (BID) | INTRAVENOUS | Status: DC
  Administered 2018-07-11 – 2018-07-12 (×2): 3 mL via INTRAVENOUS

## 2018-07-11 MED ORDER — IODIXANOL 320 MG/ML IV SOLN
INTRAVENOUS | Status: DC | PRN
  Administered 2018-07-11: 40.8 mL via INTRA_ARTERIAL

## 2018-07-11 MED ORDER — SODIUM CHLORIDE 0.9 % IV SOLN
INTRAVENOUS | Status: DC | PRN
  Administered 2018-07-11: 1500 mL

## 2018-07-11 SURGICAL SUPPLY — 86 items
ADH SKN CLS APL DERMABOND .7 (GAUZE/BANDAGES/DRESSINGS) ×2
BAG DECANTER FOR FLEXI CONT (MISCELLANEOUS) IMPLANT
BAG SNAP BAND KOVER 36X36 (MISCELLANEOUS) ×4 IMPLANT
BLADE CLIPPER SURG (BLADE) IMPLANT
BLADE STERNUM SYSTEM 6 (BLADE) IMPLANT
BLADE SURG 10 STRL SS (BLADE) IMPLANT
CABLE ADAPT CONN TEMP 6FT (ADAPTER) ×4 IMPLANT
CANISTER SUCT 3000ML PPV (MISCELLANEOUS) IMPLANT
CATH DIAG EXPO 6F VENT PIG 145 (CATHETERS) ×8 IMPLANT
CATH EXPO 5FR AL1 (CATHETERS) IMPLANT
CATH EXTERNAL FEMALE PUREWICK (CATHETERS) IMPLANT
CATH INFINITI 6F AL2 (CATHETERS) IMPLANT
CATH S G BIP PACING (CATHETERS) ×4 IMPLANT
CHLORAPREP W/TINT 26ML (MISCELLANEOUS) ×4 IMPLANT
CLIP VESOCCLUDE MED 24/CT (CLIP) IMPLANT
CLIP VESOCCLUDE SM WIDE 24/CT (CLIP) IMPLANT
CLOSURE MYNX CONTROL 6F/7F (Vascular Products) ×2 IMPLANT
CONT SPEC 4OZ CLIKSEAL STRL BL (MISCELLANEOUS) ×8 IMPLANT
COVER BACK TABLE 80X110 HD (DRAPES) IMPLANT
COVER WAND RF STERILE (DRAPES) ×4 IMPLANT
CRADLE DONUT ADULT HEAD (MISCELLANEOUS) ×4 IMPLANT
DECANTER SPIKE VIAL GLASS SM (MISCELLANEOUS) ×4 IMPLANT
DERMABOND ADVANCED (GAUZE/BANDAGES/DRESSINGS) ×2
DERMABOND ADVANCED .7 DNX12 (GAUZE/BANDAGES/DRESSINGS) ×2 IMPLANT
DEVICE CLOSURE PERCLS PRGLD 6F (VASCULAR PRODUCTS) ×4 IMPLANT
DRAPE INCISE IOBAN 66X45 STRL (DRAPES) IMPLANT
DRSG TEGADERM 4X4.75 (GAUZE/BANDAGES/DRESSINGS) ×8 IMPLANT
ELECT CAUTERY BLADE 6.4 (BLADE) IMPLANT
ELECT REM PT RETURN 9FT ADLT (ELECTROSURGICAL) ×8
ELECTRODE REM PT RTRN 9FT ADLT (ELECTROSURGICAL) ×4 IMPLANT
FELT TEFLON 6X6 (MISCELLANEOUS) ×4 IMPLANT
GAUZE SPONGE 4X4 12PLY STRL (GAUZE/BANDAGES/DRESSINGS) ×4 IMPLANT
GAUZE SPONGE 4X4 12PLY STRL LF (GAUZE/BANDAGES/DRESSINGS) ×2 IMPLANT
GLOVE BIO SURGEON STRL SZ7.5 (GLOVE) IMPLANT
GLOVE BIO SURGEON STRL SZ8 (GLOVE) IMPLANT
GLOVE EUDERMIC 7 POWDERFREE (GLOVE) IMPLANT
GLOVE ORTHO TXT STRL SZ7.5 (GLOVE) IMPLANT
GOWN STRL REUS W/ TWL LRG LVL3 (GOWN DISPOSABLE) IMPLANT
GOWN STRL REUS W/ TWL XL LVL3 (GOWN DISPOSABLE) ×2 IMPLANT
GOWN STRL REUS W/TWL LRG LVL3 (GOWN DISPOSABLE)
GOWN STRL REUS W/TWL XL LVL3 (GOWN DISPOSABLE) ×4
GUIDEWIRE SAF TJ AMPL .035X180 (WIRE) ×4 IMPLANT
GUIDEWIRE SAFE TJ AMPLATZ EXST (WIRE) ×4 IMPLANT
GUIDEWIRE STRAIGHT .035 260CM (WIRE) ×4 IMPLANT
INSERT FOGARTY SM (MISCELLANEOUS) IMPLANT
KIT BASIN OR (CUSTOM PROCEDURE TRAY) ×4 IMPLANT
KIT HEART LEFT (KITS) ×4 IMPLANT
KIT SUCTION CATH 14FR (SUCTIONS) IMPLANT
KIT TURNOVER KIT B (KITS) ×4 IMPLANT
LOOP VESSEL MAXI BLUE (MISCELLANEOUS) IMPLANT
LOOP VESSEL MINI RED (MISCELLANEOUS) IMPLANT
NS IRRIG 1000ML POUR BTL (IV SOLUTION) ×4 IMPLANT
PACK ENDO MINOR (CUSTOM PROCEDURE TRAY) ×4 IMPLANT
PAD ARMBOARD 7.5X6 YLW CONV (MISCELLANEOUS) ×8 IMPLANT
PAD ELECT DEFIB RADIOL ZOLL (MISCELLANEOUS) ×4 IMPLANT
PENCIL BUTTON HOLSTER BLD 10FT (ELECTRODE) IMPLANT
PERCLOSE PROGLIDE 6F (VASCULAR PRODUCTS) ×8
SET MICROPUNCTURE 5F STIFF (MISCELLANEOUS) ×4 IMPLANT
SHEATH BRITE TIP 7FR 35CM (SHEATH) ×4 IMPLANT
SHEATH PINNACLE 6F 10CM (SHEATH) ×4 IMPLANT
SHEATH PINNACLE 8F 10CM (SHEATH) ×4 IMPLANT
SLEEVE REPOSITIONING LENGTH 30 (MISCELLANEOUS) ×4 IMPLANT
STOPCOCK MORSE 400PSI 3WAY (MISCELLANEOUS) ×8 IMPLANT
SUT ETHIBOND X763 2 0 SH 1 (SUTURE) IMPLANT
SUT GORETEX CV 4 TH 22 36 (SUTURE) IMPLANT
SUT GORETEX CV4 TH-18 (SUTURE) IMPLANT
SUT MNCRL AB 3-0 PS2 18 (SUTURE) IMPLANT
SUT PROLENE 5 0 C 1 36 (SUTURE) IMPLANT
SUT PROLENE 6 0 C 1 30 (SUTURE) IMPLANT
SUT SILK  1 MH (SUTURE) ×2
SUT SILK 1 MH (SUTURE) ×2 IMPLANT
SUT VIC AB 2-0 CT1 27 (SUTURE)
SUT VIC AB 2-0 CT1 TAPERPNT 27 (SUTURE) IMPLANT
SUT VIC AB 2-0 CTX 36 (SUTURE) IMPLANT
SUT VIC AB 3-0 SH 8-18 (SUTURE) IMPLANT
SYR 50ML LL SCALE MARK (SYRINGE) ×4 IMPLANT
SYR BULB IRRIGATION 50ML (SYRINGE) IMPLANT
SYR MEDRAD MARK V 150ML (SYRINGE) ×4 IMPLANT
TAPE CLOTH SURG 4X10 WHT LF (GAUZE/BANDAGES/DRESSINGS) ×2 IMPLANT
TOWEL GREEN STERILE (TOWEL DISPOSABLE) ×8 IMPLANT
TRANSDUCER W/STOPCOCK (MISCELLANEOUS) ×8 IMPLANT
TRAY FOLEY SLVR 14FR TEMP STAT (SET/KITS/TRAYS/PACK) IMPLANT
TUBE SUCT INTRACARD DLP 20F (MISCELLANEOUS) IMPLANT
URINAL MALE W/LID DISP 1000CC (MISCELLANEOUS) IMPLANT
VALVE HEART TRANSCATH SZ3 26MM (Valve) ×2 IMPLANT
WIRE .035 3MM-J 145CM (WIRE) ×4 IMPLANT

## 2018-07-11 NOTE — Discharge Instructions (Signed)

## 2018-07-11 NOTE — Progress Notes (Signed)
Patient requested that brother be updated on her status. This RN called brother and updated and all questions answered. Will monitor patient. Donna Rios, Bettina Gavia rN

## 2018-07-11 NOTE — Anesthesia Procedure Notes (Signed)
Arterial Line Insertion Start/End6/16/2020 9:45 AM, 07/11/2018 9:54 AM Performed by: Harden Mo, CRNA, CRNA  Patient location: Pre-op. Preanesthetic checklist: patient identified, IV checked, site marked, risks and benefits discussed, surgical consent, monitors and equipment checked, pre-op evaluation and anesthesia consent Lidocaine 1% used for infiltration Right, radial was placed Catheter size: 20 G Hand hygiene performed  and maximum sterile barriers used  Allen's test indicative of satisfactory collateral circulation Attempts: 2 Procedure performed without using ultrasound guided technique. Ultrasound Notes:anatomy identified, needle tip was noted to be adjacent to the nerve/plexus identified and no ultrasound evidence of intravascular and/or intraneural injection Following insertion, dressing applied and Biopatch. Post procedure assessment: normal  Patient tolerated the procedure well with no immediate complications.

## 2018-07-11 NOTE — Transfer of Care (Signed)
Immediate Anesthesia Transfer of Care Note  Patient: Donna Rios  Procedure(s) Performed: TRANSCATHETER AORTIC VALVE REPLACEMENT, TRANSFEMORAL (N/A Chest) Intraoperative Transthoracic Echocardiogram (N/A )  Patient Location: Cath Lab  Anesthesia Type:MAC  Level of Consciousness: awake, alert  and oriented  Airway & Oxygen Therapy: Patient Spontanous Breathing and Patient connected to nasal cannula oxygen  Post-op Assessment: Report given to RN, Post -op Vital signs reviewed and stable and Patient moving all extremities  Post vital signs: Reviewed and stable  Last Vitals:  Vitals Value Taken Time  BP    Temp 36.6 C 07/11/18 1340  Pulse    Resp    SpO2      Last Pain:  Vitals:   07/11/18 1340  TempSrc: Temporal  PainSc:       Patients Stated Pain Goal: 2 (01/12/74 8832)  Complications: No apparent anesthesia complications

## 2018-07-11 NOTE — Op Note (Addendum)
[facility]   MULTIDISCIPLINARY HEART VALVE TEAM   TAVR OPERATIVE NOTE   Date of Procedure:  07/11/2018  Preoperative Diagnosis: Severe Aortic Stenosis   Postoperative Diagnosis: Same   Procedure:    Transcatheter Aortic Valve Replacement - Percutaneous Right Transfemoral Approach  Edwards Sapien 3 THV (size 26 mm, model # 9600TFX, serial # [id number])   Co-Surgeons:  Gaye Pollack, MD and Sherren Mocha, MD   Anesthesiologist:  Suzette Battiest, MD  Echocardiographer:  Jenkins Rouge, MD  Pre-operative Echo Findings:  Severe aortic stenosis  Normal left ventricular systolic function  Post-operative Echo Findings:  no paravalvular leak  Normal left ventricular systolic function   BRIEF CLINICAL NOTE AND INDICATIONS FOR SURGERY  This 68 year old woman has stage D, critical, symptomatic aortic stenosis with New York Heart Association class II symptoms of exertional fatigue and shortness of breath consistent with chronic diastolic congestive heart failure. I have personally reviewed her 2D echocardiogram, cardiac catheterization, and CTA studies. Her most recent echocardiogram shows a severely calcified aortic valve with markedly decreased leaflet excursion. Is not possible to tell if this is a bicuspid or trileaflet valve. The mean aortic valve gradient of 60 mmHg consistent with critical aortic stenosis. Left ventricular systolic function is normal with ejection fraction of 60 to 65%. Her cardiac catheterization shows no significant coronary disease with normal right heart pressures. I agree that aortic valve replacement is indicated in this patient with critical aortic stenosis who has had a significant increase in her mean gradient over the past year.  The patient was counseled at length regarding treatment alternatives for management of severe symptomatic aortic stenosis. The risks and benefits of surgical intervention has been discussed in detail. Long-term  prognosis with medical therapy was discussed. Alternative approaches such as conventional surgical aortic valve replacement, transcatheter aortic valve replacement, and palliative medical therapy were compared and contrasted at length. This discussion was placed in the context of the patient's own specific clinical presentation and past medical history. All of her questions have been addressed. She would rather proceed with transcatheter aortic valve replacement due to her degenerative arthritis and reduced mobility. I think that is a reasonable alternative for her. Her gated cardiac CTA shows anatomy suitable for transcatheter aortic valve replacement. Her abdominal pelvic CTA shows adequate pelvic vascular anatomy to allow transfemoral insertion.  Following the decision to proceed with transcatheter aortic valve replacement, a discussion was held regarding what types of management strategies would be attempted intraoperatively in the event of life-threatening complications, including whether or not the patient would be considered a candidate for the use of cardiopulmonary bypass and/or conversion to open sternotomy for attempted surgical intervention. Her surgical risk for open AVR is low and I think she would certainly be a candidate for emergent sternotomy if needed to manage any intraoperative complications.  The patient has been advised of a variety of complications that might develop including but not limited to risks of death, stroke, paravalvular leak, aortic dissection or other major vascular complications, aortic annulus rupture, device embolization, cardiac rupture or perforation, mitral regurgitation, acute myocardial infarction, arrhythmia, heart block or bradycardia requiring permanent pacemaker placement, congestive heart failure, respiratory failure, renal failure, pneumonia, infection, other late complications related to structural valve deterioration or migration, or other complications that  might ultimately cause a temporary or permanent loss of functional independence or other long term morbidity. The patient provides full informed consent for the procedure as described and all questions were answered.  DETAILS OF THE OPERATIVE PROCEDURE  PREPARATION:    The patient is brought to the operating room on the above mentioned date and appropriate monitoring was established by the anesthesia team. The patient is placed in the supine position on the operating table.  Intravenous antibiotics are administered. The patient is monitored closely throughout the procedure under conscious sedation.  Baseline transthoracic echocardiogram was performed. The patient's chest, abdomen, both groins, and both lower extremities are prepared and draped in a sterile manner. A time out procedure is performed.   PERIPHERAL ACCESS:    Using the modified Seldinger technique, femoral arterial and venous access was obtained with placement of 6 Fr sheaths on the left side.  A pigtail diagnostic catheter was passed through the left arterial sheath under fluoroscopic guidance into the aortic root.  A temporary transvenous pacemaker catheter was passed through the left femoral venous sheath under fluoroscopic guidance into the right ventricle.  The pacemaker was tested to ensure stable lead placement and pacemaker capture. Aortic root angiography was performed in order to determine the optimal angiographic angle for valve deployment.   TRANSFEMORAL ACCESS:   Percutaneous transfemoral access and sheath placement was performed using ultrasound guidance.  The right common femoral artery was cannulated using a micropuncture needle and appropriate location was verified using hand injection angiogram.  A pair of Abbott Perclose percutaneous closure devices were placed and a 6 French sheath replaced into the femoral artery.  The patient was heparinized systemically and ACT verified > 250 seconds.    A 14 Fr  transfemoral E-sheath was introduced into the right common femoral artery after progressively dilating over an Amplatz superstiff wire. An AL-1 catheter was used to direct a straight-tip exchange length wire across the native aortic valve into the left ventricle. This was exchanged out for a pigtail catheter and position was confirmed in the LV apex. Simultaneous LV and Ao pressures were recorded.  The pigtail catheter was exchanged for an Amplatz Extra-stiff wire in the LV apex.    BALLOON AORTIC VALVULOPLASTY:   Not performed.  TRANSCATHETER HEART VALVE DEPLOYMENT:   An Edwards Sapien 3 transcatheter heart valve (size 26 mm, model #9600TFX, serial #[id number]) was prepared and crimped per manufacturer's guidelines, and the proper orientation of the valve is confirmed on the [employer] delivery system. The valve was advanced through the introducer sheath using normal technique until in an appropriate position in the abdominal aorta beyond the sheath tip. The balloon was then retracted and using the fine-tuning wheel was centered on the valve. The valve was then advanced across the aortic arch using appropriate flexion of the catheter. The valve was carefully positioned across the aortic valve annulus. The Commander catheter was retracted using normal technique. Once final position of the valve has been confirmed by angiographic assessment, the valve is deployed while temporarily holding ventilation and during rapid ventricular pacing to maintain systolic blood pressure < 50 mmHg and pulse pressure < 10 mmHg. The balloon inflation is held for >3 seconds after reaching full deployment volume. Once the balloon has fully deflated the balloon is retracted into the ascending aorta and valve function is assessed using echocardiography. There is felt to be no paravalvular leak and no central aortic insufficiency.  The patient's hemodynamic recovery following valve deployment is good.  The deployment balloon and  guidewire are both removed.    PROCEDURE COMPLETION:   The sheath was removed and femoral artery closure performed.  Protamine was administered once femoral arterial repair was complete.  The temporary pacemaker, pigtail catheters and femoral sheaths were removed with manual pressure used for hemostasis.    The patient tolerated the procedure well and is transported to the surgical intensive care in stable condition. There were no immediate intraoperative complications. All sponge instrument and needle counts are verified correct at completion of the operation.   No blood products were administered during the operation.  The patient received a total of 40.8 mL of intravenous contrast during the procedure.   Gaye Pollack, MD 07/11/2018 3:10 PM

## 2018-07-11 NOTE — Op Note (Signed)
[facility]   MULTIDISCIPLINARY HEART VALVE TEAM   TAVR OPERATIVE NOTE   Date of Procedure:  07/11/2018  Preoperative Diagnosis: Severe Aortic Stenosis   Postoperative Diagnosis: Same   Procedure:    Transcatheter Aortic Valve Replacement - Percutaneous  Transfemoral Approach  Edwards Sapien 3 THV (size 26 mm, model # 9600TFX, serial #[id number])   Co-Surgeons:  Gaye Pollack, MD and Sherren Mocha, MD  Anesthesiologist:  Suzette Battiest, MD  Echocardiographer:  Jenkins Rouge, MD  Pre-operative Echo Findings:  Severe aortic stenosis  Normal/vigorous left ventricular systolic function  Post-operative Echo Findings:  No paravalvular leak  Normal left ventricular systolic function  BRIEF CLINICAL NOTE AND INDICATIONS FOR SURGERY  Please see the complete note of Dr. Cyndia Bent.  DETAILS OF THE OPERATIVE PROCEDURE  PREPARATION:   The patient is brought to the operating room on the above mentioned date and central monitoring was established by the anesthesia team including placement of a central venous catheter and radial arterial line. The patient is placed in the supine position on the operating table.  Intravenous antibiotics are administered. The patient is monitored closely throughout the procedure under conscious sedation.  Baseline transthoracic echocardiogram is performed. The patient's chest, abdomen, both groins, and both lower extremities are prepared and draped in a sterile manner. A time out procedure is performed.   PERIPHERAL ACCESS:   Using ultrasound guidance, femoral arterial and venous access is obtained with placement of 6 Fr sheaths on the left side.  A pigtail diagnostic catheter was passed through the femoral arterial sheath under fluoroscopic guidance into the aortic root.  A temporary transvenous pacemaker catheter was passed through the femoral venous sheath under fluoroscopic guidance into the right ventricle.  The pacemaker was tested  to ensure stable lead placement and pacemaker capture. Aortic root angiography was performed in order to determine the optimal angiographic angle for valve deployment.  TRANSFEMORAL ACCESS:  A micropuncture technique is used to access the right femoral artery under fluoroscopic and ultrasound guidance.  2 Perclose devices are deployed at 10' and 2' positions to 'PreClose' the femoral artery. An 8 French sheath is placed and then an Amplatz Superstiff wire is advanced through the sheath. This is changed out for a 14 French transfemoral E-Sheath after progressively dilating over the Superstiff wire.  An AL-1 catheter was used to direct a straight-tip exchange length wire across the native aortic valve into the left ventricle. This was exchanged out for a pigtail catheter and position was confirmed in the LV apex. Simultaneous LV and Ao pressures were recorded.  The pigtail catheter was exchanged for an Amplatz Extra-stiff wire in the LV apex.  Echocardiography was utilized to confirm appropriate wire position and no sign of entanglement in the mitral subvalvular apparatus.  BALLOON AORTIC VALVULOPLASTY:  Not performed  TRANSCATHETER HEART VALVE DEPLOYMENT:  An Edwards Sapien 3 transcatheter heart valve (size 26 mm, model #9600TFX, serial #[id number]) was prepared and crimped per manufacturer's guidelines, and the proper orientation of the valve is confirmed on the [employer] delivery system. The valve was advanced through the introducer sheath using normal technique until in an appropriate position in the abdominal aorta beyond the sheath tip. The balloon was then retracted and using the fine-tuning wheel was centered on the valve. The valve was then advanced across the aortic arch using appropriate flexion of the catheter. The valve was carefully positioned across the aortic valve annulus. The Commander catheter was retracted using normal technique. Once final position of  the valve has been confirmed by  angiographic assessment, the valve is deployed while temporarily holding ventilation and during rapid ventricular pacing to maintain systolic blood pressure < 50 mmHg and pulse pressure < 10 mmHg. The balloon inflation is held for >3 seconds after reaching full deployment volume. Once the balloon has fully deflated the balloon is retracted into the ascending aorta and valve function is assessed using echocardiography. There is felt to be no paravalvular leak and no central aortic insufficiency.  The patient's hemodynamic recovery following valve deployment is good.  The deployment balloon and guidewire are both removed. Echo demostrated acceptable post-procedural gradients, stable mitral valve function, and no aortic insufficiency.   PROCEDURE COMPLETION:  The sheath was removed and femoral artery closure is performed using the 2 previously deployed Perclose devices.  Protamine is administered once femoral arterial repair was complete. The site is clear with no evidence of bleeding or hematoma after the sutures are tightened. The temporary pacemaker, pigtail catheters and femoral sheaths were removed with manual pressure used for hemostasis.   The patient tolerated the procedure well and is transported to the surgical intensive care in stable condition. There were no immediate intraoperative complications. All sponge instrument and needle counts are verified correct at completion of the operation.   The patient received a total of 40.8 mL of intravenous contrast during the procedure.   Sherren Mocha, MD 07/11/2018 3:32 PM

## 2018-07-11 NOTE — Progress Notes (Addendum)
Patient very tearful at this time. She stating that her anxiety is rising. Stated "a lot of things building up " feeling very overwhelmed   Donna Rios [facility] made aware and orders received. Will monitor patient. Jayvon Mounger, Bettina Gavia RN

## 2018-07-11 NOTE — Progress Notes (Signed)
Patient arrived from cath lab to 4e23. B groins level 0. Patient does have a rash to rt abdomen just above groin area. Patient also with dry darkened area of skin to left shin. Patient placed on monitor and vital signs obtained. Will monitor patient. Leif Loflin, Bettina Gavia rN

## 2018-07-11 NOTE — Progress Notes (Signed)
  [place] VALVE TEAM  Patient doing well s/p TAVR. She is hemodynamically stable. Groin sites stable. ECG with sinus and new LBBB (QRS 150 ms) but no high grade block. Arterial line discontinued and transfer to 4E when bed available. Plan for early ambulation after bedrest completed and hopeful discharge over the next 24-48 hours. She has an allergy to aspirin listed, but I clarified with her that she can tolerate a baby aspirin. This has been added to her hospital medications.   Angelena Form PA-C  [facility]  Pager [phone]

## 2018-07-11 NOTE — Anesthesia Procedure Notes (Signed)
Procedure Name: MAC Date/Time: 07/11/2018 11:27 AM Performed by: Leonor Liv, CRNA Pre-anesthesia Checklist: Patient identified, Emergency Drugs available, Suction available, Patient being monitored and Timeout performed Patient Re-evaluated:Patient Re-evaluated prior to induction Oxygen Delivery Method: Simple face mask Placement Confirmation: positive ETCO2 Dental Injury: Teeth and Oropharynx as per pre-operative assessment

## 2018-07-11 NOTE — Progress Notes (Signed)
Patients husband called and updated on patient. All questions answered will monitor patient. Donna Rios, Bettina Gavia rN

## 2018-07-11 NOTE — Interval H&P Note (Signed)
History and Physical Interval Note:  07/11/2018 11:19 AM  Donna Rios  has presented today for surgery, with the diagnosis of Severe Aortic Stenosis.  The various methods of treatment have been discussed with the patient and family. After consideration of risks, benefits and other options for treatment, the patient has consented to  Procedure(s): TRANSCATHETER AORTIC VALVE REPLACEMENT, TRANSFEMORAL (N/A) Intraoperative Transthoracic Echocardiogram (N/A) as a surgical intervention.  The patient's history has been reviewed, patient examined, no change in status, stable for surgery.  I have reviewed the patient's chart and labs.  Questions were answered to the patient's satisfaction.     Shortness of breath: Yes.   If yes: with what activity?: walking Worse than previously noted?: No.  New edema, PND, orthopnea: No.  Recent decrease in activity i.e. more difficulty walking to mailbox, climbing stairs, etc: No.  Changes in sleeping i.e. need to utilize to sleep on more pillows, sitting up, etc: No.  Changes since last seen in pre-op visit: No.  Gaye Pollack

## 2018-07-11 NOTE — Progress Notes (Signed)
Patient ambulated in hallway after bed rest completed. B groins level zero. Back in bed. Call bell with in reach. Will monitor patient. Justice Aguirre, Bettina Gavia RN

## 2018-07-11 NOTE — Progress Notes (Signed)
Patient ID: Donna Rios, female   DOB: [birth year], 68 y.o.   MRN: [id number] TCTS:  Hemodynamically stable. Sinus rhythm 80's with PAC's  Feels fine. Alert and conversant. Groin sites look good. Feet warm and pink.

## 2018-07-11 NOTE — Anesthesia Postprocedure Evaluation (Signed)
Anesthesia Post Note  Patient: NEWELL FRATER  Procedure(s) Performed: TRANSCATHETER AORTIC VALVE REPLACEMENT, TRANSFEMORAL (N/A Chest) Intraoperative Transthoracic Echocardiogram (N/A )     Patient location during evaluation: PACU Anesthesia Type: MAC Level of consciousness: awake and alert Pain management: pain level controlled Vital Signs Assessment: post-procedure vital signs reviewed and stable Respiratory status: spontaneous breathing, nonlabored ventilation, respiratory function stable and patient connected to nasal cannula oxygen Cardiovascular status: stable and blood pressure returned to baseline Postop Assessment: no apparent nausea or vomiting Anesthetic complications: no    Last Vitals:  Vitals:   07/11/18 1630 07/11/18 1633  BP: (!) 89/53 106/61  Pulse: 73 74  Resp:    Temp:    SpO2: 93% 96%    Last Pain:  Vitals:   07/11/18 1621  TempSrc:   PainSc: 0-No pain                 Tiajuana Amass

## 2018-07-11 NOTE — Progress Notes (Signed)
2D Echocardiogram has been performed.  Donna Rios 07/11/2018, 1:02 PM

## 2018-07-11 NOTE — Progress Notes (Signed)
Site area: Left groin a 7 french venous sheath was removed by Donna Rios RCIS  Site Prior to Removal:  Level 0  Pressure Applied For 10 MINUTES    Bedrest Beginning at  1345p  Manual:   Yes.    Patient Status During Pull:  stable  Post Pull Groin Site:  Level 0  Post Pull Instructions Given:  Yes.    Post Pull Pulses Present:  Yes.    Dressing Applied:  Yes.    Comments:  VS remain stable

## 2018-07-11 NOTE — Progress Notes (Signed)
Cardiology Office Note Date:  07/25/2018  Patient ID:  Donna Rios, Donna Rios 01/09/1951, MRN [id number] PCP:  [name], MD  Cardiologist:  Dr. Rockey Situ, MD    Chief Complaint: Hospital follow up  History of Present Illness: Donna Rios is a 68 y.o. female with history of nonobstructive CAD by cath on 06/15/2018, severe aortic stenosis s/p 26 mm Edwards Sapien 3 THV, venous insufficiency, HTN, HLD, anxiety, and tobacco abuse who presents for hospital follow up after recent admission to [facility] from 6/15 to 6/17 for planned TAVR.   Patient was previously notified by her PCP of a heart murmur ~ 20 years prior. She has stated that her father also "had a murmur." Echo ordered by PCP in 05/2017 for murmur noted on exam showed an EF of 60-65%, mild concentric LVH, no RWMA, Gr1DD, severely calcified aortic leaflets with severe stenosis with a mean gradient of 46 mmHg, peak gradient of 73 mmHg and a valve area of 0.98 cm^2, RVSF normal, PASP normal. In this setting, she was referred to Dr. Rockey Situ, who initially saw the patient in 07/2017. She was doing well at that time with recommended close follow up. She underwent repeat echo in 03/2018 that showed an EF of 41-96%, diastolic dysfunction, possible bicuspid aortic valve with severe stenosis with a mean gradient of 59 mmHg and valve area of 0.73. She also noted some increase in SOB at that time. In the above setting, she was referred to the structural heart team. She underwent R/LHC on 06/15/2018 that showed widely patent coronary arteries with no obstructive CAD, normal right heart pressures with high cardiac output and known severe aortic stenosis with a mean gradient of 60 mmHg with heavily calcified and restricted aortic valve leaflets. Pre-procedure carotid artery ultrasound showed 1-39% bilateral ICA stenosis. She was evaluated by the multidisciplinary valve team and felt to have severe, symptomatic aortic stenosis and to be a suitable candidate  for TAVR.  She underwent TAVR on 07/11/2018 with a 26 mm Edwards Sapien 3 THV via the TF approach. Intraoperatively, she had a brief run of Afib and was cardioverted back to sinus rhythm without recurrence and was felt to be related to the procedure. Post-operative echo showed an EF of 60-65%, 26 Edwards Sapien bioprosthetic aortic valve (TAVR) was present in the aortic position without evidence of rocking, dehiscence, regurgitation, or perivalvular leak with a mean systolic AV gradient of 11 mmHg and a calculated aortic valve area of 2.6 cm^2. Her initial post-procedural EKG showed a new LBBB with follow up EKG prior to discharge showing NSR with regression of LBBB and no high-grade AV block.  Following her discharge, she did note a low grade temperature of 99.3 for several days as well as a headache. She was seen in telehealth by her PCP on 6/22 with a tension-type headache as well as to follow up with anxiety. She was noted to be afebrile at that time. She also noted some mild discomfort at her femoral access site following her discharge which she indicated was improved at her video visit with her PCP. She noted some palpitations, though stated her heart rate had not exceeded 100 bpm. She was started on Zoloft for anxiety, though reported worsening BP with this medication leading to discontinuing of medication with subsequent improvement in BP.   Patient comes in doing well from a cardiac perspective.  She denies any chest pain, shortness of breath, dizziness, presyncope, or syncope.  She denies any complications from her  groin site.  Since her hospitalization she has noted intermittent headaches with "sparkles" in her peripheral vision that are split-second lasting and occur randomly.  She denies any slurred speech or focal weakness.  She did note some blurry vision during her hospital admission though did not think much of this given she has had recurrent cataracts.  She quit smoking tobacco 16/14/2020 and  has subsequently stopped using nicotine patches secondary to vivid dreams and palpitations.  She continues to note intermittent palpitations that occur randomly with documented heart rates being less than 100 bpm.  Blood pressures at home have been in the 1 teens to 119J systolic for the most part.  She reports compliance with aspirin and Plavix.  She denies any falls, BRBPR, or melena.  She reports some swelling along the medial aspect of the left elbow.  Of note, patient previously suffered a fractured elbow at this location.  Mild lower extremity swelling is stable and at baseline.  She states in the setting of no tobacco use she is quite irritable and moody.  Past Medical History:  Diagnosis Date   Allergic rhinitis    Arthritis    Fatty liver    Hypertension    Phlebitis    S/P TAVR (transcatheter aortic valve replacement)    Severe aortic stenosis    Stress incontinence    Tobacco abuse     Past Surgical History:  Procedure Laterality Date   BREAST BIOPSY Left    ducts removed   Cataract surgery     COLONOSCOPY WITH PROPOFOL N/A 09/29/2017   Procedure: COLONOSCOPY WITH PROPOFOL;  Surgeon: Lin Landsman, MD;  Location: [facility];  Service: Gastroenterology;  Laterality: N/A;   EYE SURGERY     FOOT SURGERY     7   INTRAOPERATIVE TRANSTHORACIC ECHOCARDIOGRAM N/A 07/11/2018   Procedure: Intraoperative Transthoracic Echocardiogram;  Surgeon: Sherren Mocha, MD;  Location: [place];  Service: Open Heart Surgery;  Laterality: N/A;   KNEE ARTHROSCOPY Right    LAPAROSCOPIC HYSTERECTOMY     RIGHT HEART CATH AND CORONARY ANGIOGRAPHY N/A 06/15/2018   Procedure: RIGHT HEART CATH AND CORONARY ANGIOGRAPHY;  Surgeon: Sherren Mocha, MD;  Location: [place] [facility];  Service: Cardiovascular;  Laterality: N/A;   TRANSCATHETER AORTIC VALVE REPLACEMENT, TRANSFEMORAL  07/11/2018   TRANSCATHETER AORTIC VALVE REPLACEMENT, TRANSFEMORAL N/A 07/11/2018   Procedure:  TRANSCATHETER AORTIC VALVE REPLACEMENT, TRANSFEMORAL;  Surgeon: Sherren Mocha, MD;  Location: [place];  Service: Open Heart Surgery;  Laterality: N/A;   VARICOSE VEIN SURGERY      Current Meds  Medication Sig   acetaminophen (TYLENOL) 650 MG CR tablet Take 650 mg by mouth every 8 (eight) hours as needed for pain.    ALPRAZolam (XANAX) 0.25 MG tablet Take 1 tablet (0.25 mg total) by mouth every 8 (eight) hours as needed for anxiety.   amoxicillin (AMOXIL) 500 MG tablet Take 4 tablets (2,000 mg total) by mouth as directed. 1 hour prior to dental work, including cleanings   APPLE CIDER VINEGAR PO Take 1 tablet by mouth daily as needed (with fatty foods).    aspirin EC 81 MG EC tablet Take 1 tablet (81 mg total) by mouth daily.   b complex vitamins tablet Take 1 tablet by mouth every other day.   Calcium Carbonate-Vit D-Min (CALCIUM 1200 PO) Take 1,200 mg by mouth daily.    cetirizine (ZYRTEC) 10 MG tablet Take 10 mg by mouth as needed.    clopidogrel (PLAVIX) 75 MG tablet Take 1  tablet (75 mg total) by mouth daily with breakfast.   fluticasone (FLONASE) 50 MCG/ACT nasal spray Place 1 spray into both nostrils daily as needed for allergies.    Menthol, Topical Analgesic, (ICY HOT EX) Apply 1 application topically 4 (four) times daily as needed (muscle pain).    Multiple Vitamin (MULTIVITAMIN WITH MINERALS) TABS tablet Take 1 tablet by mouth daily. Women's Complete Multivitamin 50+   Probiotic Product (PROBIOTIC ADVANCED PO) Take 1 tablet by mouth every other day.    Propylene Glycol (SYSTANE COMPLETE OP) Place 1 drop into both eyes 3 (three) times daily as needed (dry/irritated eyes.).    rosuvastatin (CRESTOR) 40 MG tablet Take 1 tablet (40 mg total) by mouth daily.   sodium chloride (OCEAN) 0.65 % SOLN nasal spray Place 1 spray into both nostrils 4 (four) times daily as needed for congestion.     Allergies:   Ibuprofen, Asa [aspirin], and Iohexol   Social History:  The patient   reports that she has been smoking cigarettes. She has a 23.00 pack-year smoking history. She has never used smokeless tobacco. She reports previous alcohol use. She reports that she does not use drugs.   Family History:  The patient's family history includes Alcoholism in an other family member; Arthritis in an other family member; Diabetes in her mother and another family member; Heart disease in an other family member; Heart failure in her father and mother; Hypertension in her mother and another family member; Hypotension in her father; Lung cancer in an other family member; Stroke in an other family member.  ROS:   Review of Systems  Constitutional: Positive for malaise/fatigue. Negative for chills, diaphoresis, fever and weight loss.  HENT: Negative for congestion.   Eyes: Positive for blurred vision. Negative for double vision, photophobia, pain, discharge and redness.       Blurred vision is unchanged and has been attributed to recurrent cataracts.  She also notes "sparkles" at her split-second lasting in her peripheral field of vision  Respiratory: Negative for cough, hemoptysis, sputum production, shortness of breath and wheezing.   Cardiovascular: Negative for chest pain, palpitations, orthopnea, claudication, leg swelling and PND.  Gastrointestinal: Negative for abdominal pain, blood in stool, heartburn, melena, nausea and vomiting.  Genitourinary: Negative for hematuria.  Musculoskeletal: Negative for falls and myalgias.  Skin: Negative for rash.  Neurological: Negative for dizziness, tingling, tremors, sensory change, speech change, focal weakness, loss of consciousness and weakness.  Endo/Heme/Allergies: Does not bruise/bleed easily.  Psychiatric/Behavioral: Negative for depression, hallucinations, memory loss, substance abuse and suicidal ideas. The patient is nervous/anxious and has insomnia.        Last cigarette 07/09/2018.  No longer using nicotine patches secondary to  associated palpitations.  All other systems reviewed and are negative.    PHYSICAL EXAM:  VS:  BP (!) 150/80 (BP Location: Left Arm, Patient Position: Sitting, Cuff Size: Large)    Pulse 74    Ht 5' 6.5" (1.689 m)    Wt 215 lb 12 oz (97.9 kg)    BMI 34.30 kg/m  BMI: Body mass index is 34.3 kg/m.  Physical Exam  Constitutional: She is oriented to person, place, and time. She appears well-developed and well-nourished.  HENT:  Head: Normocephalic and atraumatic.  Eyes: Right eye exhibits no discharge. Left eye exhibits no discharge.  Neck: Normal range of motion. No JVD present.  Cardiovascular: Normal rate, regular rhythm, S1 normal and S2 normal. Exam reveals no distant heart sounds, no friction rub, no midsystolic  click and no opening snap.  Murmur heard. Pulses:      Posterior tibial pulses are 2+ on the right side and 2+ on the left side.  I/VI systolic murmur best heard in the right upper sternal border.  Groin site is well-healing without active bleeding, bruising, swelling, warmth, erythema, or tenderness to palpation.  No bruit.  Pulmonary/Chest: Effort normal and breath sounds normal. No respiratory distress. She has no decreased breath sounds. She has no wheezes. She has no rales. She exhibits no tenderness.  Abdominal: Soft. She exhibits no distension. There is no abdominal tenderness.  Musculoskeletal:        General: Edema present.     Comments: Trace bilateral pretibial edema with chronic hyperpigmentation consistent with chronic swelling  Neurological: She is alert and oriented to person, place, and time.  Skin: Skin is warm and dry. No cyanosis. Nails show no clubbing.  Psychiatric: She has a normal mood and affect. Her speech is normal and behavior is normal. Judgment and thought content normal.     EKG:  Was ordered and interpreted by me today. Shows NSR, 74 bpm, LVH, nonspecific lateral ST-T changes improved  Recent Labs: 07/07/2018: ALT 27; B Natriuretic Peptide  63.7 07/12/2018: BUN 17; Creatinine, Ser 0.79; Magnesium 1.9; Potassium 3.8; Sodium 140 07/25/2018: Hemoglobin 11.7; Platelets 226  02/10/2018: Cholesterol 191; HDL 52.30; Total CHOL/HDL Ratio 4; Triglycerides 286.0; VLDL 57.2 03/20/2018: Direct LDL 93.0   Estimated Creatinine Clearance: 81.3 mL/min (by C-G formula based on SCr of 0.79 mg/dL).   Wt Readings from Last 3 Encounters:  07/25/18 215 lb 12 oz (97.9 kg)  07/12/18 217 lb 6 oz (98.6 kg)  07/07/18 214 lb 9.6 oz (97.3 kg)     Other studies reviewed: Additional studies/records reviewed today include: summarized above  ASSESSMENT AND PLAN:  1. Aortic stenosis status post TAVR: She appears to be doing well from a TAVR perspective.  She denies any symptoms or physical exam findings consistent of decompensation.  Continue dual antiplatelet therapy with aspirin and Plavix for the next 6 months followed by aspirin indefinitely as directed by the structural heart clinic.  SBE prophylaxis was again discussed in detail and the patient has amoxicillin for as needed use for dental procedures.  Her groin access site is healing well without any apparent complications on exam today.  Repeat EKG does not demonstrate evidence of high-grade AV block.  Post-cath instructions discussed in detail.  Follow-up with structural heart clinic as directed.  2. Visual changes: Patient has attributed her visual changes initially to her recurrent bilateral cataracts.  Schedule stat CT head today.  If this is unrevealing recommend she follow-up with PCP/ophthalmology.  3. Nonobstructive CAD: Recent cardiac cath demonstrating essentially normal coronary arteries as outlined above.  No symptoms concerning for angina.  Continue primary prevention and aggressive risk factor modification.  No plans for repeating ischemic evaluation at this time.  4. Palpitations: Patient reported palpitations during twelve-lead EKG and during exam today.  Twelve-lead EKG showed normal sinus  rhythm without evidence of ectopy and auscultation along with peripheral manual pulse demonstrated a regular rate and rhythm.  Schedule Zio patch to evaluate for significant arrhythmia.  Cannot exclude anxiety.  5. Venous insufficiency: Stable.  Recommend leg elevation and compression stockings as directed.  6. Hypertension: Blood pressure is mildly elevated today though she is quite anxious.  Recent BP readings provided by home log show most readings ranging in the 1 teens to 175Z systolic.  In this setting, we  will not initiate antihypertensive therapy and she will continue to monitor her blood pressures.  Low-sodium diet recommended.  7. Hyperlipidemia: Check lipid panel/direct LDL along with liver function testing.  Remains on Crestor 40 mg daily.  8. Tobacco abuse: Last cigarette was 07/09/2018.  She is no longer using nicotine patches secondary to nightmares and palpitations.  She does note some increased irritability with cessation of tobacco use though is tolerating this well.  She denies any SI/HI.  Follow-up with PCP.  9. Anxiety: She continues to struggle with significant anxiety surrounding her recent procedure as well as any current world climate including COVID-19 pandemic.  She did not tolerate Zoloft.  Recommend she follow-up with PCP as directed.  10. Left elbow swelling: There is no appreciable swelling noted on physical exam today.  Recommend she follow-up with PCP.  Disposition: F/u with Dr. Rockey Situ or an APP in 3 to 6 months and with the structural heart clinic as directed in 07/2018.  Current medicines are reviewed at length with the patient today.  The patient did not have any concerns regarding medicines.  Signed, Christell Faith, PA-C 07/25/2018 11:52 AM     [place] [address] [place], [place] [address] [phone]

## 2018-07-12 ENCOUNTER — Inpatient Hospital Stay (HOSPITAL_COMMUNITY): Payer: Medicare Other

## 2018-07-12 ENCOUNTER — Other Ambulatory Visit: Payer: Self-pay | Admitting: Physician Assistant

## 2018-07-12 DIAGNOSIS — I350 Nonrheumatic aortic (valve) stenosis: Secondary | ICD-10-CM

## 2018-07-12 DIAGNOSIS — Z952 Presence of prosthetic heart valve: Secondary | ICD-10-CM

## 2018-07-12 DIAGNOSIS — Z954 Presence of other heart-valve replacement: Secondary | ICD-10-CM

## 2018-07-12 LAB — CBC
HCT: 29.9 % — ABNORMAL LOW (ref 36.0–46.0)
Hemoglobin: 10.1 g/dL — ABNORMAL LOW (ref 12.0–15.0)
MCH: 31.5 pg (ref 26.0–34.0)
MCHC: 33.8 g/dL (ref 30.0–36.0)
MCV: 93.1 fL (ref 80.0–100.0)
Platelets: 143 10*3/uL — ABNORMAL LOW (ref 150–400)
RBC: 3.21 MIL/uL — ABNORMAL LOW (ref 3.87–5.11)
RDW: 13 % (ref 11.5–15.5)
WBC: 13.2 10*3/uL — ABNORMAL HIGH (ref 4.0–10.5)
nRBC: 0 % (ref 0.0–0.2)

## 2018-07-12 LAB — BASIC METABOLIC PANEL
Anion gap: 7 (ref 5–15)
BUN: 17 mg/dL (ref 8–23)
CO2: 23 mmol/L (ref 22–32)
Calcium: 8.7 mg/dL — ABNORMAL LOW (ref 8.9–10.3)
Chloride: 110 mmol/L (ref 98–111)
Creatinine, Ser: 0.79 mg/dL (ref 0.44–1.00)
GFR calc Af Amer: >60 mL/min (ref >60)
GFR calc non Af Amer: >60 mL/min (ref >60)
Glucose, Bld: 122 mg/dL — ABNORMAL HIGH (ref 70–99)
Potassium: 3.8 mmol/L (ref 3.5–5.1)
Sodium: 140 mmol/L (ref 135–145)

## 2018-07-12 LAB — ECHOCARDIOGRAM COMPLETE
Height: 66.5 in
Weight: 3477.98 oz

## 2018-07-12 LAB — MAGNESIUM: Magnesium: 1.9 mg/dL (ref 1.7–2.4)

## 2018-07-12 MED ORDER — AMOXICILLIN 500 MG PO TABS: 2000.0000 mg | ORAL_TABLET | ORAL | 12 refills | Status: DC

## 2018-07-12 MED ORDER — ASPIRIN 81 MG PO TBEC: 81.0000 mg | DELAYED_RELEASE_TABLET | Freq: Every day | ORAL | Status: DC

## 2018-07-12 MED ORDER — ALPRAZOLAM 0.25 MG PO TABS: 0.2500 mg | ORAL_TABLET | Freq: Three times a day (TID) | ORAL | 0 refills | Status: DC | PRN

## 2018-07-12 MED ORDER — CLOPIDOGREL BISULFATE 75 MG PO TABS: 75.0000 mg | ORAL_TABLET | Freq: Every day | ORAL | 1 refills | Status: DC

## 2018-07-12 MED FILL — Potassium Chloride Inj 2 mEq/ML: INTRAVENOUS | Qty: 40 | Status: AC

## 2018-07-12 MED FILL — Heparin Sodium (Porcine) Inj 1000 Unit/ML: INTRAMUSCULAR | Qty: 30 | Status: AC

## 2018-07-12 MED FILL — Magnesium Sulfate Inj 50%: INTRAMUSCULAR | Qty: 10 | Status: AC

## 2018-07-12 NOTE — Progress Notes (Signed)
Order received to discharge patient.  Telemetry monitor removed and CCMD notified.  PIV access removed.  Discharge instructions, follow up, medications and instructions for their use discussed with patient.  Patient given valve identification card before DC.

## 2018-07-12 NOTE — Progress Notes (Signed)
  Echocardiogram 2D Echocardiogram has been performed.  Donna Rios 07/12/2018, 11:28 AM

## 2018-07-12 NOTE — Plan of Care (Signed)

## 2018-07-12 NOTE — Progress Notes (Signed)
1 Day Post-Op Procedure(s) (LRB): TRANSCATHETER AORTIC VALVE REPLACEMENT, TRANSFEMORAL (N/A) Intraoperative Transthoracic Echocardiogram (N/A) Subjective: No complaints. Ambulating well.  Objective: Vital signs in last 24 hours: Temp:  [97.5 F (36.4 C)-98 F (36.7 C)] 98 F (36.7 C) (06/17 0817) Pulse Rate:  [73-95] 87 (06/17 0817) Cardiac Rhythm: Normal sinus rhythm (06/17 0747) Resp:  [11-18] 18 (06/17 0515) BP: (89-127)/(53-89) 122/72 (06/17 0817) SpO2:  [90 %-99 %] 99 % (06/17 0817) Weight:  [98.6 kg] 98.6 kg (06/17 0515)  Hemodynamic parameters for last 24 hours:    Intake/Output from previous day: 06/16 0701 - 06/17 0700 In: 3239.4 [P.O.:970; I.V.:1969.2; IV Piggyback:300.2] Out: 550 [Urine:500; Blood:50] Intake/Output this shift: Total I/O In: 100 [P.O.:100] Out: -   General appearance: alert and cooperative Neurologic: intact Heart: regular rate and rhythm, S1, S2 normal, no murmur Lungs: clear to auscultation bilaterally Extremities: extremities normal, pedal pulses palpable. Wound: groin sites look good.  Lab Results: Recent Labs    07/11/18 1339 07/12/18 0502  WBC  --  13.2*  HGB 11.9* 10.1*  HCT 35.0* 29.9*  PLT  --  143*   BMET:  Recent Labs    07/11/18 1309 07/11/18 1339 07/12/18 0502  NA 143 143 140  K 4.0 4.0 3.8  CL  --   --  110  CO2  --   --  23  GLUCOSE 195*  --  122*  BUN  --   --  17  CREATININE  --  0.50 0.79  CALCIUM  --   --  8.7*    PT/INR: No results for input(s): LABPROT, INR in the last 72 hours. ABG    Component Value Date/Time   PHART 7.301 (L) 07/11/2018 1151   HCO3 22.8 07/11/2018 1339   TCO2 24 07/11/2018 1339   ACIDBASEDEF 3.0 (H) 07/11/2018 1339   O2SAT 82.0 07/11/2018 1339   CBG (last 3)  No results for input(s): GLUCAP in the last 72 hours.  ECG: sinus, LVH. No acute changes  Assessment/Plan: S/P Procedure(s) (LRB): TRANSCATHETER AORTIC VALVE REPLACEMENT, TRANSFEMORAL (N/A) Intraoperative  Transthoracic Echocardiogram (N/A)  Hemodynamically stable in sinus rhythm. ECG stable. 2D echo pending this am. Plan home later today.   LOS: 1 day    Gaye Pollack 07/12/2018

## 2018-07-12 NOTE — Discharge Summary (Signed)
Donna Rios  Discharge Summary    Patient ID: Donna Rios MRN: [id number]; DOB: Jan 14, 1951  Admit date: 07/11/2018 Discharge date: 07/12/2018  Primary Care Provider: [name], MD  Primary Cardiologist: Dr. Rockey Situ / Dr. Burt Knack & Dr. Cyndia Bent (TAVR)  Discharge Diagnoses    Principal Problem:   S/P TAVR (transcatheter aortic valve replacement) Active Problems:   Tobacco abuse   Prediabetes   Hyperlipidemia   Severe aortic valve stenosis   GERD (gastroesophageal reflux disease)   Allergies Allergies  Allergen Reactions   Ibuprofen Other (See Comments)    High does causes stomach pains   Asa [Aspirin] Other (See Comments)    UNSPECIFIED REACTION    Iohexol Rash    Pt had reaction after 06/21/18 TAVR CT scans    Diagnostic Studies/Procedures    TAVR OPERATIVE NOTE   Date of Procedure:                07/11/2018  Preoperative Diagnosis:      Severe Aortic Stenosis   Postoperative Diagnosis:    Same   Procedure:        Transcatheter Aortic Valve Replacement - Percutaneous Right Transfemoral Approach             Edwards Sapien 3 THV (size 26 mm, model # 9600TFX, serial # [id number])              Co-Surgeons:                        Gaye Pollack, MD and Sherren Mocha, MD   Anesthesiologist:                  Suzette Battiest, MD  Echocardiographer:              Jenkins Rouge, MD  Pre-operative Echo Findings: ? Severe aortic stenosis ? Normal left ventricular systolic function  Post-operative Echo Findings: ? no paravalvular leak ? Normal left ventricular systolic function _____________    Echo 07/12/18: pending formal read  History of Present Illness     Donna Rios is a 68 y.o. female with a history of HTN, arthritis, anxiety and bicuspid AoV with severe AS who presented to [facility] on 07/11/18 for planned TAVR.  The patient is a 68 year old woman with a history of hypertension,  degenerative arthritis, and known bicuspid aortic valve with severe aortic stenosis by echocardiogram in May 2019.  She tends to minimize her symptoms and is not that active due to limitation from arthritis.  She says she does have mild shortness of breath with physical activity and gets fatigued but takes things slowly and does not feel like it changes her routine.  She has had occasional episodes of chest discomfort but not necessarily related to exertion.  She denies any dizziness or syncope.  She has had no orthopnea or PND.  She does have peripheral edema.  She had a follow-up echocardiogram in March 2020 which showed progression of her aortic stenosis to critical with her mean aortic valve gradient increasing from 46 mm 1 year ago to 60 mm now.  Her peak gradient is 96 mmHg.  Aortic valve area was measured at 0.69 cm.  She subsequently underwent cardiac catheterization on 06/15/2018 which showed widely patent coronary arteries with no obstructive coronary disease.  Right heart pressures and cardiac output were normal.  The patient has been evaluated by the multidisciplinary valve Rios and  felt to have severe, symptomatic aortic stenosis and to be a suitable candidate for TAVR, which was set up for 07/11/18.    Hospital Course     Consultants: none  Severe AS: s/p successful TAVR with a 26 mm Edwards Sapien 3 THV via the TF approach on 07/11/18. Post operative echo completed but pending formal read. Groin sites are stable. Initial ECG showed new LBBB but ECG today with NSR, regression of LBBB and no high grade heart block. Continue ASA and plavix x 6 months followed by aspirin alone. SBE prophylaxis discussed; I have RX'd amoxicillin.   Anxiety: patient has had a lot of anxiety around her valve surgery and request some Xanax until she can be seen by her PCP. Dr. Burt Knack has agreed to send in a short Rx. This has been called in.   PAF: she had a brief run of afib intraoperatively and was cardioverted  back to NSR. No recurrence and felt to be related to procedure.   HTN: Bp has been well controlled.   _____________  Discharge Vitals Blood pressure 121/64, pulse 82, temperature 98 F (36.7 C), temperature source Oral, resp. rate 18, height 5' 6.5" (1.689 m), weight 98.6 kg, SpO2 98 %.  Filed Weights   07/11/18 0845 07/12/18 0515  Weight: 97.1 kg 98.6 kg    Labs & Radiologic Studies    CBC Recent Labs    07/11/18 1339 07/12/18 0502  WBC  --  13.2*  HGB 11.9* 10.1*  HCT 35.0* 29.9*  MCV  --  93.1  PLT  --  341*   Basic Metabolic Panel Recent Labs    07/11/18 1309 07/11/18 1339 07/12/18 0502  NA 143 143 140  K 4.0 4.0 3.8  CL  --   --  110  CO2  --   --  23  GLUCOSE 195*  --  122*  BUN  --   --  17  CREATININE  --  0.50 0.79  CALCIUM  --   --  8.7*  MG  --   --  1.9   Liver Function Tests No results for input(s): AST, ALT, ALKPHOS, BILITOT, PROT, ALBUMIN in the last 72 hours. No results for input(s): LIPASE, AMYLASE in the last 72 hours. Cardiac Enzymes No results for input(s): CKTOTAL, CKMB, CKMBINDEX, TROPONINI in the last 72 hours. BNP Invalid input(s): POCBNP D-Dimer No results for input(s): DDIMER in the last 72 hours. Hemoglobin A1C No results for input(s): HGBA1C in the last 72 hours. Fasting Lipid Panel No results for input(s): CHOL, HDL, LDLCALC, TRIG, CHOLHDL, LDLDIRECT in the last 72 hours. Thyroid Function Tests No results for input(s): TSH, T4TOTAL, T3FREE, THYROIDAB in the last 72 hours.  Invalid input(s): FREET3 _____________  Dg Chest 2 View  Result Date: 07/07/2018 CLINICAL DATA:  Preop evaluation for upcoming TAVR, history of aortic stenosis EXAM: CHEST - 2 VIEW COMPARISON:  06/21/2018 FINDINGS: Cardiac shadows within normal limits. Aortic calcifications are seen. The lungs are well aerated bilaterally without focal infiltrate or sizable effusion. No acute bony abnormality is noted. IMPRESSION: No acute abnormality seen. Electronically  Signed   By: Inez Catalina M.D.   On: 07/07/2018 15:04   Ct Coronary Morph W/cta Cor W/score W/ca W/cm &/or Wo/cm  Addendum Date: 06/21/2018   ADDENDUM REPORT: 06/21/2018 17:08 CLINICAL DATA:  68 year old female with suspected bicuspid aortic valve and severe aortic stenosis being evaluated for a TAVR procedure. EXAM: Cardiac TAVR CT TECHNIQUE: The patient was scanned on a [employer]  scanner. A 120 kV retrospective scan was triggered in the descending thoracic aorta at 111 HU's. Gantry rotation speed was 250 msecs and collimation was .6 mm. No beta blockade or nitro were given. The 3D data set was reconstructed in 5% intervals of the R-R cycle. Systolic and diastolic phases were analyzed on a dedicated work station using MPR, MIP and VRT modes. The patient received 80 cc of contrast. FINDINGS: Aortic Valve: Trileaflet aortic valve with severely calcified and thickened leaflets and moderately restricted leaflet openings. There are no calcifications extending into the LVOT. Aorta: Normal size with minimal calcifications and atherosclerotic plaque and no dissection. Sinotubular Junction: 25 x 25 mm Ascending Thoracic Aorta: 35 x 35 mm Aortic Arch: 25 x 25 mm Descending Thoracic Aorta: 27 x 26 mm Sinus of Valsalva Measurements: Non-coronary: 31 mm Right -coronary: 31 mm Left -coronary: 32 mm Coronary Artery Height above Annulus: Left Main: 17 mm Right Coronary: 18 mm Virtual Basal Annulus Measurements: Maximum/Minimum Diameter: 27.4 x 23.4 mm Mean Diameter: 24.5 mm Perimeter: 79.6 mm Area: 472 mm2 Optimum Fluoroscopic Angle for Delivery:  RAO 0 CRA 0. IMPRESSION: 1. Trileaflet aortic valve with severely calcified and thickened leaflets and moderately restricted leaflet openings. There are no calcifications extending into the LVOT. Annular measurements are suitable for delivery of a 26 mm Edwards-SAPIEN 3 valve. 2. Aortic valve calcium score is 1759 consistent with severe aortic stenosis. 3. Sufficient coronary to  annulus distance. 4. Optimum Fluoroscopic Angle for Delivery: RAO 0 CRA 0. 5. No thrombus in the left atrial appendage. Electronically Signed   By: Ena Dawley   On: 06/21/2018 17:08   Result Date: 06/21/2018 EXAM: OVER-READ INTERPRETATION  CT CHEST The following report is an over-read performed by radiologist Dr. Vinnie Langton of [facility], [place] on 06/21/2018. This over-read does not include interpretation of cardiac or coronary anatomy or pathology. The coronary calcium score/coronary CTA interpretation by the cardiologist is attached. COMPARISON:  CT the abdomen and pelvis 01/03/2018. Chest CT 06/21/2017. FINDINGS: Extracardiac findings will be described separately under dictation for contemporaneously obtained CTA chest, abdomen and pelvis. IMPRESSION: Please see separate dictation for contemporaneously obtained CTA chest, abdomen and pelvis dated 06/21/2018 for full description of relevant extracardiac findings. Electronically Signed: By: Vinnie Langton M.D. On: 06/21/2018 12:10   Dg Chest Port 1 View  Result Date: 07/11/2018 CLINICAL DATA:  Transcatheter aortic valvular replacement EXAM: PORTABLE CHEST 1 VIEW COMPARISON:  None. FINDINGS: The heart size and mediastinal contours are within normal limits. Both lungs are clear. The visualized skeletal structures are unremarkable. IMPRESSION: No active disease. Electronically Signed   By: Ulyses Jarred M.D.   On: 07/11/2018 18:32   Ct Angio Chest Aorta W &/or Wo Contrast  Result Date: 06/21/2018 CLINICAL DATA:  68 year old female with history of severe aortic stenosis. Preprocedural study prior to potential transcatheter aortic valve replacement (TAVR) procedure. EXAM: CT ANGIOGRAPHY CHEST, ABDOMEN AND PELVIS TECHNIQUE: Multidetector CT imaging through the chest, abdomen and pelvis was performed using the standard protocol during bolus administration of intravenous contrast. Multiplanar reconstructed images and MIPs were obtained and  reviewed to evaluate the vascular anatomy. CONTRAST:  156mL OMNIPAQUE IOHEXOL 350 MG/ML SOLN COMPARISON:  CT the abdomen and pelvis 01/03/2018. Low-dose lung cancer screening chest CT 06/21/2017. FINDINGS: CTA CHEST FINDINGS Cardiovascular: Heart size is borderline enlarged. There is no significant pericardial fluid, thickening or pericardial calcification. Aortic atherosclerosis. No definite coronary artery calcifications. Severe thickening calcification of the aortic valve. Mediastinum/Lymph Nodes: No pathologically enlarged no  suspicious appearing pulmonary nodules or masses. No acute consolidative airspace disease. No pleural effusions. Lymph nodes. Esophagus is unremarkable in appearance. No axillary lymphadenopathy. Lungs/Pleura: No suspicious appearing pulmonary nodules or masses. No acute consolidative airspace disease. No pleural effusions. Musculoskeletal/Soft Tissues: There are no aggressive appearing lytic or blastic lesions noted in the visualized portions of the skeleton. CTA ABDOMEN AND PELVIS FINDINGS Hepatobiliary: No suspicious cystic or solid hepatic lesions. No intra or extrahepatic biliary ductal dilatation. Gallbladder is normal in appearance. Pancreas: No pancreatic mass. No pancreatic ductal dilatation. No pancreatic or peripancreatic fluid or inflammatory changes. Spleen: Unremarkable. Adrenals/Urinary Tract: Bilateral kidneys and bilateral adrenal glands are normal in appearance. No hydroureteronephrosis. Urinary bladder is normal in appearance. Stomach/Bowel: Normal appearance of the stomach. No pathologic dilatation of small bowel or colon. Normal appendix. Vascular/Lymphatic: Vascular findings and measurements pertinent to potential TAVR procedure, as detailed below. Fusiform ectasia of the infrarenal abdominal aorta measuring up to 2.4 x 2.4 cm. No aneurysm or dissection noted in the abdominal or pelvic vasculature. No lymphadenopathy noted in the abdomen or pelvis. Reproductive: Status  post hysterectomy. Ovaries are not confidently identified may be surgically absent or atrophic. Other: No significant volume of ascites.  No pneumoperitoneum. Musculoskeletal: There are no aggressive appearing lytic or blastic lesions noted in the visualized portions of the skeleton. VASCULAR MEASUREMENTS PERTINENT TO TAVR: AORTA: Minimal Aortic Diameter-13 x 18 mm Severity of Aortic Calcification-moderate RIGHT PELVIS: Right Common Iliac Artery - Minimal Diameter-8.9 x 9.3 mm Tortuosity-mild Calcification-moderate Right External Iliac Artery - Minimal Diameter-6.4 x 5.9 mm Tortuosity-severe Calcification-none Right Common Femoral Artery - Minimal Diameter-6.6 x 6.3 mm Tortuosity-mild Calcification-mild LEFT PELVIS: Left Common Iliac Artery - Minimal Diameter-9.8 x 8.5 mm Tortuosity-mild Calcification-mild-to-moderate Left External Iliac Artery - Minimal Diameter-6.5 x 6.1 mm Tortuosity-moderate Calcification-minimal Left Common Femoral Artery - Minimal Diameter-7.0 x 6.6 mm Tortuosity-mild Calcification-mild Review of the MIP images confirms the above findings. IMPRESSION: 1. Vascular findings and measurements pertinent to potential TAVR procedure, as detailed above. 2. Severe thickening calcification of the aortic valve, compatible with the reported clinical history of severe aortic stenosis. 3. Additional incidental findings, as above. Electronically Signed   By: Vinnie Langton M.D.   On: 06/21/2018 14:10   Vas US Carotid  Result Date: 06/21/2018 Carotid Arterial Duplex Study Indications:   Pre                 TAVR. Other Factors: Severe AS. Limitations:   High bifurcations Performing Technologist: [employer]  Examination Guidelines: A complete evaluation includes B-mode imaging, spectral Doppler, color Doppler, and power Doppler as needed of all accessible portions of each vessel. Bilateral testing is considered an integral part of a complete examination. Limited examinations for reoccurring  indications may be performed as noted.  Right Carotid Findings: +----------+--------+--------+--------+------------+-------------------------+             PSV cm/s EDV cm/s Stenosis Describe     Comments                   +----------+--------+--------+--------+------------+-------------------------+  CCA Prox   130      28                             mild intimal changes       +----------+--------+--------+--------+------------+-------------------------+  CCA Distal 105      31                                                        +----------+--------+--------+--------+------------+-------------------------+  ICA Prox   79       29       1-39%    heterogenous mild plaque at the origin  +----------+--------+--------+--------+------------+-------------------------+  ICA Mid    89       29                             tortuous                   +----------+--------+--------+--------+------------+-------------------------+  ICA Distal 90       38                             tortuous                   +----------+--------+--------+--------+------------+-------------------------+  ECA        91       17                heterogenous mild plaque at the origin  +----------+--------+--------+--------+------------+-------------------------+ +----------+--------+-------+--------+-------------------+             PSV cm/s EDV cms Describe Arm Pressure (mmHG)  +----------+--------+-------+--------+-------------------+  Subclavian 78       2                                     +----------+--------+-------+--------+-------------------+ +---------+--------+--+--------+--+  Vertebral PSV cm/s 69 EDV cm/s 26  +---------+--------+--+--------+--+ Mild technical difficulty due to high bifurcation and tortuosity  Left Carotid Findings: +----------+--------+--------+--------+------------+-------------------------+             PSV cm/s EDV cm/s Stenosis Describe     Comments                    +----------+--------+--------+--------+------------+-------------------------+  CCA Prox   94       30                             mild intimal changes       +----------+--------+--------+--------+------------+-------------------------+  CCA Distal 100      37                             mild intimal changes       +----------+--------+--------+--------+------------+-------------------------+  ICA Prox   73       19       1-39%    heterogenous mild plaque at the origin  +----------+--------+--------+--------+------------+-------------------------+  ICA Mid    51       19                             tortuous                   +----------+--------+--------+--------+------------+-------------------------+  ICA Distal 86       33                             tortuous                   +----------+--------+--------+--------+------------+-------------------------+  ECA        78       14  heterogenous mild plaque at the origin  +----------+--------+--------+--------+------------+-------------------------+ +----------+--------+--------+--------+-------------------+  Subclavian PSV cm/s EDV cm/s Describe Arm Pressure (mmHG)  +----------+--------+--------+--------+-------------------+             107      1                                      +----------+--------+--------+--------+-------------------+ +---------+--------+--+--------+--+  Vertebral PSV cm/s 59 EDV cm/s 19  +---------+--------+--+--------+--+ Mild technical difficulty due to high bifurcation and tortuosity  Summary: Right Carotid: Velocities in the right ICA are consistent with a 1-39% stenosis. Left Carotid: Velocities in the left ICA are consistent with a 1-39% stenosis. Vertebrals:  Bilateral vertebral arteries demonstrate antegrade flow. Subclavians: Normal flow hemodynamics were seen in bilateral subclavian              arteries. *See table(s) above for measurements and observations.  Electronically signed by Harold Barban MD on 06/21/2018 at  1:08:32 PM.    Final    Ct Angio Abd/pel W/ And/or W/o  Result Date: 06/21/2018 CLINICAL DATA:  68 year old female with history of severe aortic stenosis. Preprocedural study prior to potential transcatheter aortic valve replacement (TAVR) procedure. EXAM: CT ANGIOGRAPHY CHEST, ABDOMEN AND PELVIS TECHNIQUE: Multidetector CT imaging through the chest, abdomen and pelvis was performed using the standard protocol during bolus administration of intravenous contrast. Multiplanar reconstructed images and MIPs were obtained and reviewed to evaluate the vascular anatomy. CONTRAST:  195mL OMNIPAQUE IOHEXOL 350 MG/ML SOLN COMPARISON:  CT the abdomen and pelvis 01/03/2018. Low-dose lung cancer screening chest CT 06/21/2017. FINDINGS: CTA CHEST FINDINGS Cardiovascular: Heart size is borderline enlarged. There is no significant pericardial fluid, thickening or pericardial calcification. Aortic atherosclerosis. No definite coronary artery calcifications. Severe thickening calcification of the aortic valve. Mediastinum/Lymph Nodes: No pathologically enlarged no suspicious appearing pulmonary nodules or masses. No acute consolidative airspace disease. No pleural effusions. Lymph nodes. Esophagus is unremarkable in appearance. No axillary lymphadenopathy. Lungs/Pleura: No suspicious appearing pulmonary nodules or masses. No acute consolidative airspace disease. No pleural effusions. Musculoskeletal/Soft Tissues: There are no aggressive appearing lytic or blastic lesions noted in the visualized portions of the skeleton. CTA ABDOMEN AND PELVIS FINDINGS Hepatobiliary: No suspicious cystic or solid hepatic lesions. No intra or extrahepatic biliary ductal dilatation. Gallbladder is normal in appearance. Pancreas: No pancreatic mass. No pancreatic ductal dilatation. No pancreatic or peripancreatic fluid or inflammatory changes. Spleen: Unremarkable. Adrenals/Urinary Tract: Bilateral kidneys and bilateral adrenal glands are normal in  appearance. No hydroureteronephrosis. Urinary bladder is normal in appearance. Stomach/Bowel: Normal appearance of the stomach. No pathologic dilatation of small bowel or colon. Normal appendix. Vascular/Lymphatic: Vascular findings and measurements pertinent to potential TAVR procedure, as detailed below. Fusiform ectasia of the infrarenal abdominal aorta measuring up to 2.4 x 2.4 cm. No aneurysm or dissection noted in the abdominal or pelvic vasculature. No lymphadenopathy noted in the abdomen or pelvis. Reproductive: Status post hysterectomy. Ovaries are not confidently identified may be surgically absent or atrophic. Other: No significant volume of ascites.  No pneumoperitoneum. Musculoskeletal: There are no aggressive appearing lytic or blastic lesions noted in the visualized portions of the skeleton. VASCULAR MEASUREMENTS PERTINENT TO TAVR: AORTA: Minimal Aortic Diameter-13 x 18 mm Severity of Aortic Calcification-moderate RIGHT PELVIS: Right Common Iliac Artery - Minimal Diameter-8.9 x 9.3 mm Tortuosity-mild Calcification-moderate Right External Iliac Artery - Minimal Diameter-6.4 x 5.9 mm Tortuosity-severe Calcification-none Right Common Femoral Artery - Minimal Diameter-6.6 x 6.3  mm Tortuosity-mild Calcification-mild LEFT PELVIS: Left Common Iliac Artery - Minimal Diameter-9.8 x 8.5 mm Tortuosity-mild Calcification-mild-to-moderate Left External Iliac Artery - Minimal Diameter-6.5 x 6.1 mm Tortuosity-moderate Calcification-minimal Left Common Femoral Artery - Minimal Diameter-7.0 x 6.6 mm Tortuosity-mild Calcification-mild Review of the MIP images confirms the above findings. IMPRESSION: 1. Vascular findings and measurements pertinent to potential TAVR procedure, as detailed above. 2. Severe thickening calcification of the aortic valve, compatible with the reported clinical history of severe aortic stenosis. 3. Additional incidental findings, as above. Electronically Signed   By: Vinnie Langton M.D.   On:  06/21/2018 14:10   Disposition   Pt is being discharged home today in good condition.  Follow-up Plans & Appointments    Follow-up Information    Rise Mu, PA-C. Go on 07/25/2018.   Specialties: Physician Assistant, Cardiology, Radiology Why: @ 10am for an in office visit.  Contact information: [place] [address] [phone]            Discharge Medications   Allergies as of 07/12/2018      Reactions   Ibuprofen Other (See Comments)   High does causes stomach pains   Asa [aspirin] Other (See Comments)   UNSPECIFIED REACTION    Iohexol Rash   Pt had reaction after 06/21/18 TAVR CT scans      Medication List    STOP taking these medications   ibuprofen 200 MG tablet Commonly known as: ADVIL   metoprolol tartrate 50 MG tablet Commonly known as: LOPRESSOR   predniSONE 50 MG tablet Commonly known as: DELTASONE     TAKE these medications   acetaminophen 650 MG CR tablet Commonly known as: TYLENOL Take 650 mg by mouth every 8 (eight) hours as needed for pain.   ALPRAZolam 0.25 MG tablet Commonly known as: XANAX Take 1 tablet (0.25 mg total) by mouth every 8 (eight) hours as needed for anxiety.   amoxicillin 500 MG tablet Commonly known as: AMOXIL Take 4 tablets (2,000 mg total) by mouth as directed. 1 hour prior to dental work, including cleanings   APPLE CIDER VINEGAR PO Take 1 tablet by mouth daily as needed (with fatty foods).   aspirin 81 MG EC tablet Take 1 tablet (81 mg total) by mouth daily. Start taking on: July 13, 2018   b complex vitamins tablet Take 1 tablet by mouth every other day.   CALCIUM 1200 PO Take 1,200 mg by mouth every other day.   cetirizine 10 MG tablet Commonly known as: ZYRTEC Take 10 mg by mouth daily.   clopidogrel 75 MG tablet Commonly known as: PLAVIX Take 1 tablet (75 mg total) by mouth daily with breakfast. Start taking on: July 13, 2018   fluticasone 50 MCG/ACT nasal  spray Commonly known as: FLONASE Place 1 spray into both nostrils daily as needed for allergies.   ICY HOT EX Apply 1 application topically 4 (four) times daily as needed (muscle pain).   multivitamin with minerals Tabs tablet Take 1 tablet by mouth daily. Women's Complete Multivitamin 50+   nicotine 14 mg/24hr patch Commonly known as: NICODERM CQ - dosed in mg/24 hours Place 14 mg onto the skin daily.   PROBIOTIC ADVANCED PO Take 1 tablet by mouth every other day.   rosuvastatin 40 MG tablet Commonly known as: Crestor Take 1 tablet (40 mg total) by mouth daily.   sodium chloride 0.65 % Soln nasal spray Commonly known as: OCEAN Place 1 spray into both nostrils 4 (  four) times daily as needed for congestion.   SYSTANE COMPLETE OP Place 1 drop into both eyes 3 (three) times daily as needed (dry/irritated eyes.).           Outstanding Labs/Studies   none  Duration of Discharge Encounter   Greater than 30 minutes including physician time.  Signed, Angelena Form, PA-C 07/12/2018, 1:53 PM [phone]

## 2018-07-12 NOTE — Progress Notes (Addendum)
CARDIAC REHAB PHASE I   PRE:  Rate/Rhythm: 83 SR  BP:  Sitting: 115/67      SaO2: 98 RA  MODE:  Ambulation: 370 ft   POST:  Rate/Rhythm: 106 ST  BP:  Sitting: 148/78    SaO2: 97 RA  Pt ambulated 343ft in hallway independently with steady gait. Pt denies CP, SOB, or dizziness. Pt educated on site care, exercise guidelines, restrictions, and encouraged heart healthy diet. Pt very anxious, tearful, and talkative. Listening ear given to pt, encouraged pt to find ways to help relieve her stress in her home environment. Pt declining CRP II at this time.  [id number] Rufina Falco, RN BSN 07/12/2018 10:05 AM

## 2018-07-13 ENCOUNTER — Encounter (HOSPITAL_COMMUNITY): Payer: Self-pay | Admitting: Cardiovascular Disease

## 2018-07-13 ENCOUNTER — Telehealth: Payer: Self-pay | Admitting: Physician Assistant

## 2018-07-13 NOTE — Telephone Encounter (Addendum)
  [place] VALVE TEAM   Patient contacted regarding discharge from [facility] on 07/12/18  Patient understands to follow up with provider, Christell Faith on 6/30 for a TOC visit in [place], [place].  Patient understands discharge instructions? yes Patient understands medications and regimen? yes Patient understands to bring all medications to this visit? Yes   She has some mild chills last night and a 99.1 temp. No cough or sore throat. Otherwise, feeling better. Will continue to monitor.   Angelena Form PA-C  [facility]

## 2018-07-14 ENCOUNTER — Telehealth: Payer: Self-pay | Admitting: Cardiovascular Disease

## 2018-07-14 ENCOUNTER — Telehealth: Payer: Self-pay | Admitting: Family Medicine

## 2018-07-14 NOTE — Telephone Encounter (Signed)
Spoke with the patient, she stated that she had discomfort in her groin and I advised her that this is to be expected following a TAVR since they access the heart by the femoral artery. She stated that tylenol helped relieve some of her pain. I advised to monitor her access sites for infection.

## 2018-07-14 NOTE — Telephone Encounter (Signed)
  Patient states she had Tavr on 07/11/18 and she said she had a headache last night and today she is having some discomfort in her groin area. She says it isn't so much painful just uncomfortable. She states there is no redness but she does have bruising on the right side of the area. Tylenol does help some but she wants to make sure that it is normal to feel the discomfort and is there anything she can do to help it. She has also had a 99.3 temperature for the past three days.

## 2018-07-14 NOTE — Telephone Encounter (Signed)
Transition Care Management Follow-up Telephone Call  How have you been since you were released from the hospital? Patient says she feels better just has a little pressure in groin where cath performed advised patient to call cardiologist performed cath office make them aware and get advice, patient say she has no temp at this time and area is just sore , but still advised to cardiology office to re- assure the pressure feeling is normal.   Do you understand why you were in the hospital? yes   Do you understand the discharge instrcutions? yes  Items Reviewed:  Medications reviewed: yes, patient given small amount of alprazolam at DC to hold patient until visit with PCP.  Allergies reviewed: yes  Dietary changes reviewed: yes  Referrals reviewed: yes   Functional Questionnaire:   Activities of Daily Living (ADLs):   She states they are independent in the following: ambulation, bathing and hygiene, feeding, continence, grooming, toileting and dressing States they require assistance with the following: Patient  needs assitance with any heavy objects orders no lifting .   Any transportation issues/concerns?: no   Any patient concerns? Yes, see note above.   Confirmed importance and date/time of follow-up visits scheduled: yes   Confirmed with patient if condition begins to worsen call PCP or go to the ER.  Patient was given the Call-a-Nurse line [phone]: yes

## 2018-07-14 NOTE — Telephone Encounter (Signed)
Reviewed.  The patient did contact cardiology office.

## 2018-07-17 ENCOUNTER — Other Ambulatory Visit: Payer: Self-pay

## 2018-07-17 ENCOUNTER — Ambulatory Visit (INDEPENDENT_AMBULATORY_CARE_PROVIDER_SITE_OTHER): Payer: Medicare Other | Admitting: Family Medicine

## 2018-07-17 ENCOUNTER — Encounter: Payer: Self-pay | Admitting: Family Medicine

## 2018-07-17 VITALS — BP 125/72 | HR 78 | Temp 97.0°F

## 2018-07-17 DIAGNOSIS — Z720 Tobacco use: Secondary | ICD-10-CM

## 2018-07-17 DIAGNOSIS — G44209 Tension-type headache, unspecified, not intractable: Secondary | ICD-10-CM | POA: Diagnosis not present

## 2018-07-17 DIAGNOSIS — Z952 Presence of prosthetic heart valve: Secondary | ICD-10-CM | POA: Diagnosis not present

## 2018-07-17 DIAGNOSIS — F329 Major depressive disorder, single episode, unspecified: Secondary | ICD-10-CM

## 2018-07-17 DIAGNOSIS — F419 Anxiety disorder, unspecified: Secondary | ICD-10-CM | POA: Diagnosis not present

## 2018-07-17 DIAGNOSIS — F32A Depression, unspecified: Secondary | ICD-10-CM

## 2018-07-17 NOTE — Progress Notes (Signed)
Virtual Visit via video Note  This visit type was conducted due to national recommendations for restrictions regarding the COVID-19 pandemic (e.g. social distancing).  This format is felt to be most appropriate for this patient at this time.  All issues noted in this document were discussed and addressed.  No physical exam was performed (except for noted visual exam findings with Video Visits).   I connected with Donna Rios today at  2:15 PM EDT by a video enabled telemedicine application and verified that I am speaking with the correct person using two identifiers. Location patient: home Location provider: work  Persons participating in the virtual visit: patient, provider  I discussed the limitations, risks, security and privacy concerns of performing an evaluation and management service by telephone and the availability of in person appointments. I also discussed with the patient that there may be a patient responsible charge related to this service. The patient expressed understanding and agreed to proceed.   Reason for visit: Hospital follow-up.  HPI: Patient was hospitalized from 07/11/2018-07/12/2018.  This was for transcatheter aortic valve replacement for severe aortic stenosis.  She underwent a successful TAVR on 07/11/2018.  Postoperative echo was completed revealing a normal aortic valve prosthesis and normal left ventricle function.  Initial post operative EKG revealed a new left bundle branch block repeat EKG revealed normal sinus rhythm with resolution of left bundle branch block and no high-grade heart block.  She was discharged on aspirin and Plavix for 6 months.  She was prescribed amoxicillin to use for SBE prophylaxis.  She notes her groin sites are doing well.  She has had no chest pain or shortness of breath.  At times she can feel her heartbeat though it does not feel as though it is racing.  Her fatigue has gotten better. She notes her heart rate has gone up to 100 bpm at  the highest.  She did have quite a bit of anxiety in the hospital surrounding her valve surgery and was treated with Xanax.  She reports she still has some anxiety.  She has about 10 tablets of Xanax left.  This does not make her drowsy.  No depression.  No SI.  She does note her husband picks on her and that gets her riled up.  She does feel safe at home.  She notes she has quit smoking and is using a nicotine patch.  She notes some headaches while in the hospital with being on the nicotine patch.  She will have a little bit of photophobia with the headaches.  She does have a history of headaches similar to this a few years ago.  They occur over the top of her head later in the afternoon.  No vision changes, numbness, or weakness.  She had a reassuring CT head earlier this year.  She does note she is not driving until she follows up with cardiology.  She did have a brief run of atrial fibrillation intraoperatively but was cardioverted back to normal sinus rhythm.  No recurrence.  Felt to be related to the procedure.  Discharge summary reviewed.  Medications reviewed.   ROS: See pertinent positives and negatives per HPI.  Past Medical History:  Diagnosis Date   Allergic rhinitis    Arthritis    Fatty liver    Hypertension    Phlebitis    S/P TAVR (transcatheter aortic valve replacement)    Severe aortic stenosis    Stress incontinence    Tobacco abuse  Past Surgical History:  Procedure Laterality Date   BREAST BIOPSY Left    ducts removed   Cataract surgery     COLONOSCOPY WITH PROPOFOL N/A 09/29/2017   Procedure: COLONOSCOPY WITH PROPOFOL;  Surgeon: Lin Landsman, MD;  [facility] ENDOSCOPY;  Service: Gastroenterology;  Laterality: N/A;   EYE SURGERY     FOOT SURGERY     7   INTRAOPERATIVE TRANSTHORACIC ECHOCARDIOGRAM N/A 07/11/2018   Procedure: Intraoperative Transthoracic Echocardiogram;  Surgeon: Sherren Mocha, MD;  Location: [facility];  Service: Open Heart  Surgery;  Laterality: N/A;   KNEE ARTHROSCOPY Right    LAPAROSCOPIC HYSTERECTOMY     RIGHT HEART CATH AND CORONARY ANGIOGRAPHY N/A 06/15/2018   Procedure: RIGHT HEART CATH AND CORONARY ANGIOGRAPHY;  Surgeon: Sherren Mocha, MD;  Location: [facility] CV LAB;  Service: Cardiovascular;  Laterality: N/A;   TRANSCATHETER AORTIC VALVE REPLACEMENT, TRANSFEMORAL  07/11/2018   TRANSCATHETER AORTIC VALVE REPLACEMENT, TRANSFEMORAL N/A 07/11/2018   Procedure: TRANSCATHETER AORTIC VALVE REPLACEMENT, TRANSFEMORAL;  Surgeon: Sherren Mocha, MD;  Location: [place];  Service: Open Heart Surgery;  Laterality: N/A;   VARICOSE VEIN SURGERY      Family History  Problem Relation Age of Onset   Alcoholism Other    Arthritis Other    Lung cancer Other    Heart disease Other    Stroke Other    Hypertension Other    Diabetes Other    Heart failure Mother    Hypertension Mother    Diabetes Mother    Heart failure Father    Hypotension Father    Breast cancer Neg Hx     SOCIAL HX: Recently quit smoking.     Current Outpatient Medications:    acetaminophen (TYLENOL) 650 MG CR tablet, Take 650 mg by mouth every 8 (eight) hours as needed for pain. , Disp: , Rfl:    ALPRAZolam (XANAX) 0.25 MG tablet, Take 1 tablet (0.25 mg total) by mouth every 8 (eight) hours as needed for anxiety., Disp: 20 tablet, Rfl: 0   amoxicillin (AMOXIL) 500 MG tablet, Take 4 tablets (2,000 mg total) by mouth as directed. 1 hour prior to dental work, including cleanings, Disp: 12 tablet, Rfl: 12   APPLE CIDER VINEGAR PO, Take 1 tablet by mouth daily as needed (with fatty foods). , Disp: , Rfl:    aspirin EC 81 MG EC tablet, Take 1 tablet (81 mg total) by mouth daily., Disp:  , Rfl:    b complex vitamins tablet, Take 1 tablet by mouth every other day., Disp: , Rfl:    Calcium Carbonate-Vit D-Min (CALCIUM 1200 PO), Take 1,200 mg by mouth every other day., Disp: , Rfl:    cetirizine (ZYRTEC) 10 MG tablet,  Take 10 mg by mouth daily. , Disp: , Rfl:    clopidogrel (PLAVIX) 75 MG tablet, Take 1 tablet (75 mg total) by mouth daily with breakfast., Disp: 90 tablet, Rfl: 1   fluticasone (FLONASE) 50 MCG/ACT nasal spray, Place 1 spray into both nostrils daily as needed for allergies. , Disp: , Rfl:    Menthol, Topical Analgesic, (ICY HOT EX), Apply 1 application topically 4 (four) times daily as needed (muscle pain). , Disp: , Rfl:    Multiple Vitamin (MULTIVITAMIN WITH MINERALS) TABS tablet, Take 1 tablet by mouth daily. Women's Complete Multivitamin 50+, Disp: , Rfl:    nicotine (NICODERM CQ - DOSED IN MG/24 HOURS) 14 mg/24hr patch, Place 14 mg onto the skin daily. , Disp: , Rfl:  Probiotic Product (PROBIOTIC ADVANCED PO), Take 1 tablet by mouth every other day. , Disp: , Rfl:    Propylene Glycol (SYSTANE COMPLETE OP), Place 1 drop into both eyes 3 (three) times daily as needed (dry/irritated eyes.). , Disp: , Rfl:    rosuvastatin (CRESTOR) 40 MG tablet, Take 1 tablet (40 mg total) by mouth daily., Disp: 90 tablet, Rfl: 2   sodium chloride (OCEAN) 0.65 % SOLN nasal spray, Place 1 spray into both nostrils 4 (four) times daily as needed for congestion. , Disp: , Rfl:    sertraline (ZOLOFT) 50 MG tablet, Take 1 tablet (50 mg total) by mouth daily., Disp: 30 tablet, Rfl: 3  EXAM:  VITALS per patient if applicable: None. GENERAL: alert, oriented, appears well and in no acute distress  HEENT: atraumatic, conjunttiva clear, no obvious abnormalities on inspection of external nose and ears  NECK: normal movements of the head and neck  LUNGS: on inspection no signs of respiratory distress, breathing rate appears normal, no obvious gross SOB, gasping or wheezing  CV: no obvious cyanosis  MS: moves all visible extremities without noticeable abnormality  PSYCH/NEURO: pleasant and cooperative, no obvious depression or anxiety, speech and thought processing grossly intact  ASSESSMENT AND  PLAN:  Discussed the following assessment and plan:  S/P TAVR (transcatheter aortic valve replacement) Patient has done quite well.  Her fatigue has been improving.  She does note her heart rate has gone up though not excessively.  Discussed if she had recurrence of this with feeling of palpitations she should be evaluated by [place], cardiology, or at an urgent care.  She will keep her follow-up visit with cardiology.  Anxiety and depression We will start the patient on Zoloft.  I will refill Xanax.  Advised to monitor for drowsiness.  I discussed that Xanax would be a short-term medication.  Tobacco abuse Congratulated on quitting smoking.    Headache Similar to prior headaches that she has had.  She did have a recent reassuring CT head.  I suspect this is related to the nicotine patches.  She will try coming off the nicotine patch to see if the headache improves.  If not improving she will let us know.  Social distancing precautions and sick precautions given regarding COVID-19.   I discussed the assessment and treatment plan with the patient. The patient was provided an opportunity to ask questions and all were answered. The patient agreed with the plan and demonstrated an understanding of the instructions.   The patient was advised to call back or seek an in-person evaluation if the symptoms worsen or if the condition fails to improve as anticipated.   [name], MD

## 2018-07-18 DIAGNOSIS — R519 Headache, unspecified: Secondary | ICD-10-CM | POA: Insufficient documentation

## 2018-07-18 MED ORDER — ALPRAZOLAM 0.25 MG PO TABS: 0.2500 mg | ORAL_TABLET | Freq: Three times a day (TID) | ORAL | 0 refills | Status: DC | PRN

## 2018-07-18 MED ORDER — SERTRALINE HCL 50 MG PO TABS: 50.0000 mg | ORAL_TABLET | Freq: Every day | ORAL | 3 refills | Status: DC

## 2018-07-18 NOTE — Assessment & Plan Note (Signed)
Similar to prior headaches that she has had.  She did have a recent reassuring CT head.  I suspect this is related to the nicotine patches.  She will try coming off the nicotine patch to see if the headache improves.  If not improving she will let us know.

## 2018-07-18 NOTE — Assessment & Plan Note (Signed)
We will start the patient on Zoloft.  I will refill Xanax.  Advised to monitor for drowsiness.  I discussed that Xanax would be a short-term medication.

## 2018-07-18 NOTE — Assessment & Plan Note (Signed)
Congratulated on quitting smoking.

## 2018-07-18 NOTE — Assessment & Plan Note (Signed)
Patient has done quite well.  Her fatigue has been improving.  She does note her heart rate has gone up though not excessively.  Discussed if she had recurrence of this with feeling of palpitations she should be evaluated by [place], cardiology, or at an urgent care.  She will keep her follow-up visit with cardiology.

## 2018-07-20 ENCOUNTER — Telehealth: Payer: Self-pay | Admitting: *Deleted

## 2018-07-20 NOTE — Telephone Encounter (Signed)
Discussed with Ignacia Bayley, NP. Advised patient is ok to drive to appointment on 6/30 as it will have been about 2 weeks. Spoke with patient and she says she is doing well. No pain or signs of infection. She was appreciative.

## 2018-07-20 NOTE — Telephone Encounter (Signed)
I spoke with pt this morning concerning appointment and prescreening for her follow up visit. Pt had questions about driving herself and when will she be able to drive because she would like to know if she needs to make arrangements for transportation if she isn't allowed to drive at this time.  Please advise.

## 2018-07-20 NOTE — Telephone Encounter (Signed)

## 2018-07-21 ENCOUNTER — Encounter: Payer: Self-pay | Admitting: Thoracic Surgery (Cardiothoracic Vascular Surgery)

## 2018-07-21 ENCOUNTER — Telehealth: Payer: Self-pay | Admitting: *Deleted

## 2018-07-21 ENCOUNTER — Telehealth: Payer: Self-pay | Admitting: Physician Assistant

## 2018-07-21 NOTE — Telephone Encounter (Signed)
Spoke with the patient, she just wanted to update Dr. Rockey Situ about how her PCP is adjusting her medications.

## 2018-07-21 NOTE — Telephone Encounter (Signed)
Noted.  I will await her call.

## 2018-07-21 NOTE — Telephone Encounter (Signed)
Noted. She can discontinue the zoloft. Please confirm that her BP trends down after stopping the zoloft. I would like to consider an alternative medication for her anxiety if she is willing. Once you confirm that she is ok trying another medication I can check with Catie to determine if it would be ok to trial another SSRI for anxiety.

## 2018-07-21 NOTE — Telephone Encounter (Signed)
Patient took zoloft on Wednesday at 11  PM, wanted to take a shower took her blood pressure at  4:45 150/101 heart rate 87 ( could not explain but just did not feel right) waited 10 minutes 149/86 Pulse 78 and then little later 142/90 pulse 78.  5:20 pm at  6 PM 174/96 pulse 92 ( hollering at dog) then took at 6 : 11, 134/85 pulse 86. Patient feels the zoloft caused the episode of elevated BP and not" feeling right", advised patient I would notify PCP and I wanted her to just make the cardiologist aware of her symptoms , also advised if she has returning symptoms, chest pain , SOB, pressure in chest , dizziness she would need to be evaluated.

## 2018-07-21 NOTE — Telephone Encounter (Signed)
New Message      Pt c/o medication issue:  1. Name of Medication: Sertraline  2. How are you currently taking this medication (dosage and times per day)?  50mg  1 tablet by mouth daily  3. Are you having a reaction (difficulty breathing--STAT)? NO  4. What is your medication issue? Patient states Dr. [name] ordered the medication and her BP yesterday at 4:45pm was 150/101 so he told her to stop taking the medication. Today her BP was 113/70 and HR was 82 since she stopped taking the medication.  Patient just wanted to make sure she reported the issue.

## 2018-07-21 NOTE — Telephone Encounter (Signed)
Patient says she is using the Xanax they gave her she see cardiology on the 30th she would be willing to try something else after that.Patient will callto advise how BP has been and get new anxiety medication.

## 2018-07-21 NOTE — Telephone Encounter (Signed)
Copied from [place] [phone]. Topic: Quick Communication - Rx Refill/Question >> Jul 21, 2018  9:23 AM Erick Blinks wrote: Medication: sertraline (ZOLOFT) 50 MG tablet - reaction/problems. Fluctuating Heart rate and Blood Pressure readings. Please advise. Just got home from heart surgery the 17th.   [id number] VM

## 2018-07-24 ENCOUNTER — Telehealth: Payer: Self-pay | Admitting: Cardiovascular Disease

## 2018-07-24 ENCOUNTER — Encounter: Payer: Self-pay | Admitting: Thoracic Surgery (Cardiothoracic Vascular Surgery)

## 2018-07-24 NOTE — Telephone Encounter (Signed)

## 2018-07-25 ENCOUNTER — Other Ambulatory Visit: Payer: Self-pay

## 2018-07-25 ENCOUNTER — Ambulatory Visit: Payer: Medicare Other | Admitting: Physician Assistant

## 2018-07-25 ENCOUNTER — Ambulatory Visit
Admission: RE | Admit: 2018-07-25 | Discharge: 2018-07-25 | Disposition: A | Discharge status: Home / Self Care | Payer: Medicare Other | Source: Ambulatory Visit | Attending: Physician Assistant | Admitting: Physician Assistant

## 2018-07-25 ENCOUNTER — Encounter: Payer: Self-pay | Admitting: Physician Assistant

## 2018-07-25 ENCOUNTER — Other Ambulatory Visit
Admission: RE | Admit: 2018-07-25 | Discharge: 2018-07-25 | Disposition: A | Discharge status: Home / Self Care | Payer: Medicare Other | Source: Ambulatory Visit | Attending: Physician Assistant | Admitting: Physician Assistant

## 2018-07-25 VITALS — BP 150/80 | HR 74 | Ht 66.5 in | Wt 215.8 lb

## 2018-07-25 DIAGNOSIS — R002 Palpitations: Secondary | ICD-10-CM

## 2018-07-25 DIAGNOSIS — I2510 Atherosclerotic heart disease of native coronary artery without angina pectoris: Secondary | ICD-10-CM

## 2018-07-25 DIAGNOSIS — Z952 Presence of prosthetic heart valve: Secondary | ICD-10-CM

## 2018-07-25 DIAGNOSIS — E785 Hyperlipidemia, unspecified: Secondary | ICD-10-CM | POA: Insufficient documentation

## 2018-07-25 DIAGNOSIS — H532 Diplopia: Secondary | ICD-10-CM

## 2018-07-25 DIAGNOSIS — I872 Venous insufficiency (chronic) (peripheral): Secondary | ICD-10-CM

## 2018-07-25 DIAGNOSIS — F419 Anxiety disorder, unspecified: Secondary | ICD-10-CM

## 2018-07-25 DIAGNOSIS — I350 Nonrheumatic aortic (valve) stenosis: Secondary | ICD-10-CM | POA: Diagnosis not present

## 2018-07-25 DIAGNOSIS — I10 Essential (primary) hypertension: Secondary | ICD-10-CM

## 2018-07-25 DIAGNOSIS — Z720 Tobacco use: Secondary | ICD-10-CM

## 2018-07-25 DIAGNOSIS — I672 Cerebral atherosclerosis: Secondary | ICD-10-CM | POA: Diagnosis not present

## 2018-07-25 DIAGNOSIS — M25422 Effusion, left elbow: Secondary | ICD-10-CM

## 2018-07-25 DIAGNOSIS — H539 Unspecified visual disturbance: Secondary | ICD-10-CM | POA: Diagnosis not present

## 2018-07-25 LAB — COMPREHENSIVE METABOLIC PANEL
ALT: 20 U/L (ref 0–44)
AST: 20 U/L (ref 15–41)
Albumin: 4 g/dL (ref 3.5–5.0)
Alkaline Phosphatase: 84 U/L (ref 38–126)
Anion gap: 8 (ref 5–15)
BUN: 15 mg/dL (ref 8–23)
CO2: 28 mmol/L (ref 22–32)
Calcium: 9.3 mg/dL (ref 8.9–10.3)
Chloride: 103 mmol/L (ref 98–111)
Creatinine, Ser: 0.63 mg/dL (ref 0.44–1.00)
GFR calc Af Amer: >60 mL/min (ref >60)
GFR calc non Af Amer: >60 mL/min (ref >60)
Glucose, Bld: 98 mg/dL (ref 70–99)
Potassium: 4.6 mmol/L (ref 3.5–5.1)
Sodium: 139 mmol/L (ref 135–145)
Total Bilirubin: 0.2 mg/dL — ABNORMAL LOW (ref 0.3–1.2)
Total Protein: 6.9 g/dL (ref 6.5–8.1)

## 2018-07-25 LAB — CBC
HCT: 34.2 % — ABNORMAL LOW (ref 36.0–46.0)
Hemoglobin: 11.7 g/dL — ABNORMAL LOW (ref 12.0–15.0)
MCH: 31.5 pg (ref 26.0–34.0)
MCHC: 34.2 g/dL (ref 30.0–36.0)
MCV: 92.2 fL (ref 80.0–100.0)
Platelets: 226 10*3/uL (ref 150–400)
RBC: 3.71 MIL/uL — ABNORMAL LOW (ref 3.87–5.11)
RDW: 12.2 % (ref 11.5–15.5)
WBC: 8.9 10*3/uL (ref 4.0–10.5)
nRBC: 0 % (ref 0.0–0.2)

## 2018-07-25 LAB — LIPID PANEL
Cholesterol: 164 mg/dL (ref 0–200)
HDL: 51 mg/dL (ref >40)
LDL Cholesterol: 63 mg/dL (ref 0–99)
Total CHOL/HDL Ratio: 3.2 RATIO
Triglycerides: 248 mg/dL — ABNORMAL HIGH (ref <150)
VLDL: 50 mg/dL — ABNORMAL HIGH (ref 0–40)

## 2018-07-25 LAB — LDL CHOLESTEROL, DIRECT: Direct LDL: 90.8 mg/dL (ref 0–99)

## 2018-07-25 MED ORDER — ESCITALOPRAM OXALATE 10 MG PO TABS: 5.0000 mg | ORAL_TABLET | Freq: Every day | ORAL | 1 refills | Status: DC

## 2018-07-25 NOTE — Telephone Encounter (Signed)
Pt is calling back with bp reading 07-18-2018 bp 1145 am 143/93 hr 83, 07-19-2018  119/82 hr 88 pt took zoloft 50 mg around 11 pm 130/70 hr 81 and ,07-20-2018  150/101at 445 pm hr 87 , bp 149/86, hr 78 at 5 pm, 142/90 hr 78 at 520 pm, 143/84 hr 81 at 526 pm, 174/93 hr 86 at 605 pm after shower , 134/85 hr 83 at 611 pm, 730 pm after argue with her sister in law bp 164/86 hr 83, 1030 pm bp 161/89 hr 94, 124/76 hr 87 at 1042 pm pt called pharm on 07-20-2018 and was told its ok to take her xanax. 07-21-2018 bp 113/70 hr 82 that morning. Pt called [place] on that day and was told not to take anymore zoloft and stay off med over the weekend. 330 pm bp 123/71 hr 86, pt took xanax 11 pm bp at 1115 pm bp 145/86 hr 73, midnight bp 132/83 hr 82 , and on 07-22-2018 830 am bp 118/74 hr 70, 11 pm bp 129/71 and hr 77 and 6-28 at 1045 am bp 127/71 hr 75 and 1130 pm 132/76 hr 85, 6-29 517 am bp 133/78 hr 73 weight 214.8, 415 pm bp 180/81 hr 70 and 425 pm 115/83 hr 68 and 6-30 534 am 118/65 hr 72 and 10 am bp 150/80 hr 74 at cardiologist office. Pt will have heart monitor mail to her to use 2 wks by her cardiologist dr Idolina Primer. Pt is ok with trying a new anxiety medication pt would like low dose

## 2018-07-25 NOTE — Telephone Encounter (Signed)
Blood pressures seem to have improved with stopping the Zoloft.  I sent in a low dose of Lexapro for her to start on.  She should monitor her blood pressure and if it goes up with that she needs to let us know.

## 2018-07-25 NOTE — Patient Instructions (Signed)
Medication Instructions:  Your physician has recommended you make the following change in your medication:   If you need a refill on your cardiac medications before your next appointment, please call your pharmacy.   Lab work: Your physician recommends that you return for lab work today (CMET, CBC, Lipid w direct LDL) at the medical mall. No appt is needed. Hours are M-F 7AM- 6 PM.  If you have labs (blood work) drawn today and your tests are completely normal, you will receive your results only by: Marland Kitchen MyChart Message (if you have MyChart) OR . A paper copy in the mail If you have any lab test that is abnormal or we need to change your treatment, we will call you to review the results.  Testing/Procedures: 1- STAT Head CT w/o contrast for vision changes post surgery.  2-  [facility] AT: We will place order and you will receive it in the mail.  You may get a call from [place] @ either  [phone] Or  [phone] for them to confirm your address before it will be sent to you.  You will wear the monitor for 14 days, remove it and send it back to the company. They will send [place] a report. Then we will call you with the results.   Follow-Up: At [facility], you and your health needs are our priority.  As part of our continuing mission to provide you with exceptional heart care, we have created designated Provider Care Teams.  These Care Teams include your primary Cardiologist (physician) and Advanced Practice Providers (APPs -  Physician Assistants and Nurse Practitioners) who all work together to provide you with the care you need, when you need it. You will need a follow up appointment in 3-6 months.  Please call our office 2 months in advance to schedule this appointment.  You may see Dr. Rockey Situ or Christell Faith, PA-C.

## 2018-07-26 NOTE — Telephone Encounter (Signed)
Patient wants to make sure she can take Lexapro according to her heart doctor, she was on Zoloft before and has never tried Lexapro and she is worried about the side affects. Just wants to know why she could not start on a low dose Zoloft, what is your reasoning behind Lexapro, is it because of her heart and why not Zoloft.  Georganna Maxson,cma

## 2018-07-27 NOTE — Telephone Encounter (Signed)
Lexapro is already at her pharmacy.

## 2018-07-27 NOTE — Telephone Encounter (Signed)
The zoloft 50 mg dose is the typical starting dose. Most people do not have any benefit from a lower dose of 25 mg of zoloft and given her recent reaction to the zoloft with her elevated BPs there would not be any room to go up on it. I don't think there will be an issue with tying the lexapro though I will forward to her cardiologist to make sure they are ok with the lexapro.

## 2018-07-27 NOTE — Telephone Encounter (Signed)
Called patient and explained about the lexapro you can send it to the pharmacy and she will do the EKG in 102 weeks after starting.  Nina,cma

## 2018-07-27 NOTE — Telephone Encounter (Signed)
Thank you for reviewing and looking in to this. I will have someone from our office let the patient know about the lexapro and see if she is willing to proceed with this medication.   Gae Bon, could you reach out to the patient and let her know that I heard back from cardiology and they noted that it would ok to proceed with the lexapro. They will be contacting her to come in for an EKG after starting the lexapro. If she is willing to start on this we can proceed with lexapro.

## 2018-07-27 NOTE — Telephone Encounter (Signed)
Dr. [name],  Her most recent QTC was stable at 428 ms.  I have reached out to Dr. Burt Knack with the structural heart team and there are no absolute contraindications with Lexapro following TAVR.  Given her stable QTC it would be okay for her to proceed with Lexapro.  I will reach out to our office and have her come in for an EKG 1 to 2 weeks after starting the medication to reevaluate her QTC.  See [facility] clinical staff, please have patient come in for EKG only to evaluate QTC.

## 2018-07-29 ENCOUNTER — Ambulatory Visit (INDEPENDENT_AMBULATORY_CARE_PROVIDER_SITE_OTHER): Payer: Medicare Other

## 2018-07-29 DIAGNOSIS — I471 Supraventricular tachycardia: Secondary | ICD-10-CM | POA: Diagnosis not present

## 2018-07-29 DIAGNOSIS — R002 Palpitations: Secondary | ICD-10-CM | POA: Diagnosis not present

## 2018-07-29 DIAGNOSIS — Z9189 Other specified personal risk factors, not elsewhere classified: Secondary | ICD-10-CM | POA: Diagnosis not present

## 2018-08-07 ENCOUNTER — Telehealth: Payer: Self-pay | Admitting: *Deleted

## 2018-08-07 NOTE — Telephone Encounter (Signed)
Contacted about scheduling lung screening scan. However patient has had a recent hospitalization and is having multiple tests and family obligations in the near future. She ask to consider lung screening scan in a month.

## 2018-08-10 ENCOUNTER — Telehealth: Payer: Self-pay

## 2018-08-10 NOTE — Telephone Encounter (Signed)

## 2018-08-11 ENCOUNTER — Ambulatory Visit (INDEPENDENT_AMBULATORY_CARE_PROVIDER_SITE_OTHER): Payer: Medicare Other

## 2018-08-11 ENCOUNTER — Other Ambulatory Visit: Payer: Self-pay

## 2018-08-11 DIAGNOSIS — Z952 Presence of prosthetic heart valve: Secondary | ICD-10-CM

## 2018-08-15 ENCOUNTER — Other Ambulatory Visit: Payer: Self-pay

## 2018-08-15 DIAGNOSIS — Z952 Presence of prosthetic heart valve: Secondary | ICD-10-CM

## 2018-08-15 NOTE — Progress Notes (Signed)
[facility]                                       Cardiology Office Note    Date:  08/16/2018   ID:  Donna Rios, DOB 06/24/1950, MRN [id number]  PCP:  [name], MD  Cardiologist: Dr. Rockey Situ / Dr. Burt Knack & Dr. Cyndia Bent (TAVR)  CC: 1 month s/p TAVR   History of Present Illness:  Donna Rios is a 68 y.o. female with a history of HTN, arthritis, anxiety, tobacco abuse, and bicuspid AoV with severe AS s/p TAVR (07/11/18) who presents to clinic for follow up.   Patient had had a heart murmur for at least 20 years. She had known AS. Follow-up echocardiogram in 03/2018 showed progression of her aortic stenosis to critical with her mean aortic valve gradient increasing from 46 mm 1 year ago to 60 mm hg. She subsequently underwent cardiac catheterization on 06/15/2018 which showed widely patent coronary arteries with no obstructive coronary disease.   She was evaluated by the multidisciplinary valve team and underwent successful TAVR with a 26 mm Edwards Sapien 3 THV via the TF approach on 07/11/18. She had a brief run of Afib and was cardioverted back to sinus rhythm without recurrence and was felt to be procedure related. Post operative echo showed normal prosthetic valve function with a mean gradient of 11.3 mm Hg. She was started Aspirin and plavix. She also requested xanax for anxiety.   She was seen for post hospital follow up by Christell Faith PA-C in the office on 6/30. She complained of increased anxiety from quitting smoking. She also complained of palpations and a Zio patch was placed.   Today she presents to clinic for follow up. No CP or SOB. Some chronic mild LE edema (from orthopedic issues), but no orthopnea or PND. No dizziness or syncope. No blood in stool or urine. No palpitations. Reviewed BP logs and mostly in good ranges. She is mostly limited by debilitating fatigue. ALso has depression and aneixty. Tearful in the office  today when I asked if she thought fatigue could be related to depression. The pandemic has been very hard on her.   Past Medical History:  Diagnosis Date  . Allergic rhinitis   . Arthritis   . Fatty liver   . Hypertension   . Phlebitis   . S/P TAVR (transcatheter aortic valve replacement)   . Severe aortic stenosis   . Stress incontinence   . Tobacco abuse     Past Surgical History:  Procedure Laterality Date  . BREAST BIOPSY Left    ducts removed  . Cataract surgery    . COLONOSCOPY WITH PROPOFOL N/A 09/29/2017   Procedure: COLONOSCOPY WITH PROPOFOL;  Surgeon: Lin Landsman, MD;  Location: [facility];  Service: Gastroenterology;  Laterality: N/A;  . EYE SURGERY    . FOOT SURGERY     7  . INTRAOPERATIVE TRANSTHORACIC ECHOCARDIOGRAM N/A 07/11/2018   Procedure: Intraoperative Transthoracic Echocardiogram;  Surgeon: Sherren Mocha, MD;  Location: [facility];  Service: Open Heart Surgery;  Laterality: N/A;  . KNEE ARTHROSCOPY Right   . LAPAROSCOPIC HYSTERECTOMY    . RIGHT HEART CATH AND CORONARY ANGIOGRAPHY N/A 06/15/2018   Procedure: RIGHT HEART CATH AND CORONARY ANGIOGRAPHY;  Surgeon: Sherren Mocha, MD;  Location: [facility];  Service: Cardiovascular;  Laterality:  N/A;  . TRANSCATHETER AORTIC VALVE REPLACEMENT, TRANSFEMORAL  07/11/2018  . TRANSCATHETER AORTIC VALVE REPLACEMENT, TRANSFEMORAL N/A 07/11/2018   Procedure: TRANSCATHETER AORTIC VALVE REPLACEMENT, TRANSFEMORAL;  Surgeon: Sherren Mocha, MD;  Location: [facility];  Service: Open Heart Surgery;  Laterality: N/A;  . VARICOSE VEIN SURGERY      Current Medications: Outpatient Medications Prior to Visit  Medication Sig Dispense Refill  . acetaminophen (TYLENOL) 650 MG CR tablet Take 650 mg by mouth every 8 (eight) hours as needed for pain.     Marland Kitchen ALPRAZolam (XANAX) 0.25 MG tablet Take 1 tablet (0.25 mg total) by mouth every 8 (eight) hours as needed for anxiety. 20 tablet 0  . amoxicillin (AMOXIL) 500 MG tablet Take  4 tablets (2,000 mg total) by mouth as directed. 1 hour prior to dental work, including cleanings 12 tablet 12  . APPLE CIDER VINEGAR PO Take 1 tablet by mouth daily as needed (with fatty foods).     Marland Kitchen aspirin EC 81 MG EC tablet Take 1 tablet (81 mg total) by mouth daily.    Marland Kitchen b complex vitamins tablet Take 1 tablet by mouth every other day.    . Calcium Carbonate-Vit D-Min (CALCIUM 1200 PO) Take 1,200 mg by mouth daily.     . cetirizine (ZYRTEC) 10 MG tablet Take 10 mg by mouth as needed.     . clopidogrel (PLAVIX) 75 MG tablet Take 1 tablet (75 mg total) by mouth daily with breakfast. 90 tablet 1  . escitalopram (LEXAPRO) 10 MG tablet Take 0.5 tablets (5 mg total) by mouth daily. 45 tablet 1  . fluticasone (FLONASE) 50 MCG/ACT nasal spray Place 1 spray into both nostrils daily as needed for allergies.     . Menthol, Topical Analgesic, (ICY HOT EX) Apply 1 application topically 4 (four) times daily as needed (muscle pain).     . Multiple Vitamin (MULTIVITAMIN WITH MINERALS) TABS tablet Take 1 tablet by mouth daily. Women's Complete Multivitamin 50+    . Probiotic Product (PROBIOTIC ADVANCED PO) Take 1 tablet by mouth every other day.     Marland Kitchen Propylene Glycol (SYSTANE COMPLETE OP) Place 1 drop into both eyes 3 (three) times daily as needed (dry/irritated eyes.).     Marland Kitchen rosuvastatin (CRESTOR) 40 MG tablet Take 1 tablet (40 mg total) by mouth daily. 90 tablet 2  . sodium chloride (OCEAN) 0.65 % SOLN nasal spray Place 1 spray into both nostrils 4 (four) times daily as needed for congestion.     . nicotine (NICODERM CQ - DOSED IN MG/24 HOURS) 14 mg/24hr patch Place 14 mg onto the skin daily.      No facility-administered medications prior to visit.      Allergies:   Ibuprofen, Asa [aspirin], and Iohexol   Social History   Socioeconomic History  . Marital status: Married    Spouse name: Not on file  . Number of children: Not on file  . Years of education: Not on file  . Highest education level:  Not on file  Occupational History  . Not on file  Social Needs  . Financial resource strain: Not hard at all  . Food insecurity    Worry: Never true    Inability: Never true  . Transportation needs    Medical: No    Non-medical: No  Tobacco Use  . Smoking status: Light Tobacco Smoker    Packs/day: 0.50    Years: 46.00    Pack years: 23.00    Types: Cigarettes  .  Smokeless tobacco: Never Used  . Tobacco comment: stopped 06/11/2018...smokes 1-10 per day  Substance and Sexual Activity  . Alcohol use: Not Currently    Alcohol/week: 0.0 standard drinks  . Drug use: No  . Sexual activity: Not on file  Lifestyle  . Physical activity    Days per week: 0 days    Minutes per session: Not on file  . Stress: Not on file  Relationships  . Social Herbalist on phone: Not on file    Gets together: Not on file    Attends religious service: Not on file    Active member of club or organization: Not on file    Attends meetings of clubs or organizations: Not on file    Relationship status: Not on file  Other Topics Concern  . Not on file  Social History Narrative  . Not on file     Family History:  The patient's family history includes Alcoholism in an other family member; Arthritis in an other family member; Diabetes in her mother and another family member; Heart disease in an other family member; Heart failure in her father and mother; Hypertension in her mother and another family member; Hypotension in her father; Lung cancer in an other family member; Stroke in an other family member.      ROS:   Please see the history of present illness.    ROS All other systems reviewed and are negative.   PHYSICAL EXAM:   VS:  BP 132/86   Pulse 80   Ht 5' 6.5" (1.689 m)   Wt 215 lb 12.8 oz (97.9 kg)   SpO2 95%   BMI 34.31 kg/m    GEN: Well nourished, well developed, in no acute distress HEENT: normal Neck: no JVD or masses Cardiac: RRR; 3/6 SEM. No rubs, or gallops,no edema   Respiratory: clear to auscultation bilaterally, normal work of breathing GI: soft, nontender, nondistended, + BS MS: no deformity or atrophy Skin: warm and dry, no rash Neuro:  Alert and Oriented x 3, Strength and sensation are intact Psych: euthymic mood, full affect   Wt Readings from Last 3 Encounters:  08/16/18 215 lb 12.8 oz (97.9 kg)  07/25/18 215 lb 12 oz (97.9 kg)  07/12/18 217 lb 6 oz (98.6 kg)      Studies/Labs Reviewed:   EKG:  EKG is NOT ordered today.    Recent Labs: 07/07/2018: B Natriuretic Peptide 63.7 07/12/2018: Magnesium 1.9 07/25/2018: ALT 20; BUN 15; Creatinine, Ser 0.63; Hemoglobin 11.7; Platelets 226; Potassium 4.6; Sodium 139   Lipid Panel    Component Value Date/Time   CHOL 164 07/25/2018 1121   TRIG 248 (H) 07/25/2018 1121   HDL 51 07/25/2018 1121   CHOLHDL 3.2 07/25/2018 1121   VLDL 50 (H) 07/25/2018 1121   LDLCALC 63 07/25/2018 1121   LDLDIRECT 90.8 07/25/2018 1121    Additional studies/ records that were reviewed today include:  TAVR OPERATIVE NOTE   Date of Procedure:07/11/2018  Preoperative Diagnosis:Severe Aortic Stenosis   Postoperative Diagnosis:Same   Procedure:   Transcatheter Aortic Valve Replacement - PercutaneousRightTransfemoral Approach Edwards Sapien 3 THV (size 57mm, model # 9600TFX, serial # [id number])  Co-Surgeons:Bryan Alveria Apley, MD and Sherren Mocha, MD   Anesthesiologist:Robert Ola Spurr, MD  Echocardiographer:[name] Johnsie Cancel, MD  Pre-operative Echo Findings: ? Severe aortic stenosis ? Normalleft ventricular systolic function  Post-operative Echo Findings: ? noparavalvular leak ? Normalleft ventricular systolic function  _____________    Echo 07/12/18 IMPRESSIONS  1.  A 26 Edwards Sapien bioprosthetic aortic valve (TAVR) valve is present in the aortic position. Procedure Date:  07/11/2018 Normal aortic valve prosthesis. Echo findings shows no evidence of rocking, dehiscence, regurgitation or perivalvular leak of the  aortic prosthesis.  2. Mean systolic AV gradient 11 mmHg. Using an LVOT diameter of 2.5 cm, the calculated aortic valve area is 2.6 cm2.  3. The left ventricle has normal systolic function with an ejection fraction of 60-65%. The cavity size was normal. Left ventricular diastolic Doppler parameters are indeterminate.  4. The right ventricle has normal systolic function. The cavity was normal. There is no increase in right ventricular wall thickness.  5. The inferior vena cava was normal in size with <50% respiratory variability.  _____________    Echo 08/11/18 IMPRESSIONS  1. The left ventricle has normal systolic function with an ejection fraction of 60-65%. The cavity size was normal. There is mild to moderately increased left ventricular wall thickness. Left ventricular diastolic Doppler parameters are consistent with  impaired relaxation.  2. The right ventricle has normal systolic function. The cavity was normal. There is no increase in right ventricular wall thickness. Unable to estimate RVSP  3. Left atrial size was mildly dilated.  4. A 26 Edwards Sapien bioprosthetic aortic valve (TAVR) valve is present in the aortic position. Procedure Date: 07/11/18 Normal aortic valve prosthesis.  5. The aortic valve was not well visualized. Aortic valve regurgitation is trivial . Mean gradient 10 mm Hg, peak gradient 24 mm Hg , estimated AVA 2.45 cm sq   ASSESSMENT & PLAN:   Severe AS s/p TAVR: echo 7/17 showed EF 60%, normally functioning TAVR with mean gradient of 10 mm Hg and no PVL. She has NYHA class II symptoms; mostly of fatigue. SBE prophylaxis discussed; she has amoxicillin. Plavix can be discontinued after 6 months of therapy.   PAF: felt to be post operative. No recurrence. Oral indication not indicated at this time. Zio patch completed and just  sent back to company. Will follow results.   HTN: BP mostly in good range per review of home log. Not on any antihypertensives.  Tobacco abuse: she is still quit. I congratulated her.   Fatigue: had pretty recent CBC with no signficant anemia. Will order a TSH. I encouraged her to start exercising. I encouraged her to participate in cardiac rehab as well. She is worried copays. She also has signficant depression and started crying during out visit today. Very overwhelmed by depression and anxiety made worse by the pandemic. I offered to increase her lexapro but she would like to wait. Asked for more xanax which I will defer to her PCP.   Medication Adjustments/Labs and Tests Ordered: Current medicines are reviewed at length with the patient today.  Concerns regarding medicines are outlined above.  Medication changes, Labs and Tests ordered today are listed in the Patient Instructions below. Patient Instructions  Medication Instructions:   Discussed the use of prophylactic antibiotics before dental work and other surgeries. Prescription given for Amoxicillin 2 grams orally one hour before procedure, including cleanings.    Labwork: You will have labs drawn today: TSH  Testing/Procedures: None ordered.  Follow-Up: Your physician recommends that you schedule a follow-up appointment in:   4 months with Dr. Rockey Situ   Any Other Special Instructions Will Be Listed Below (If Applicable).     If you need a refill on your cardiac medications before your next appointment, please call your pharmacy.      Signed, Curt Bears  Billee Cashing  08/16/2018 [facility] [place], [place], [place]  [address] Phone: [phone]; Fax: [phone]

## 2018-08-16 ENCOUNTER — Telehealth: Payer: Medicare Other | Admitting: Physician Assistant

## 2018-08-16 ENCOUNTER — Other Ambulatory Visit: Payer: Self-pay

## 2018-08-16 ENCOUNTER — Other Ambulatory Visit: Payer: Self-pay | Admitting: Physician Assistant

## 2018-08-16 ENCOUNTER — Ambulatory Visit (INDEPENDENT_AMBULATORY_CARE_PROVIDER_SITE_OTHER): Payer: Medicare Other | Admitting: Physician Assistant

## 2018-08-16 ENCOUNTER — Encounter: Payer: Self-pay | Admitting: Physician Assistant

## 2018-08-16 VITALS — BP 132/86 | HR 80 | Ht 66.5 in | Wt 215.8 lb

## 2018-08-16 DIAGNOSIS — I10 Essential (primary) hypertension: Secondary | ICD-10-CM

## 2018-08-16 DIAGNOSIS — I480 Paroxysmal atrial fibrillation: Secondary | ICD-10-CM

## 2018-08-16 DIAGNOSIS — Z952 Presence of prosthetic heart valve: Secondary | ICD-10-CM | POA: Diagnosis not present

## 2018-08-16 DIAGNOSIS — Z720 Tobacco use: Secondary | ICD-10-CM

## 2018-08-16 NOTE — Patient Instructions (Signed)
Medication Instructions:   Discussed the use of prophylactic antibiotics before dental work and other surgeries. Prescription given for Amoxicillin 2 grams orally one hour before procedure, including cleanings.    Labwork: You will have labs drawn today: TSH  Testing/Procedures: None ordered.  Follow-Up: Your physician recommends that you schedule a follow-up appointment in:   4 months with Dr. Rockey Situ   Any Other Special Instructions Will Be Listed Below (If Applicable).     If you need a refill on your cardiac medications before your next appointment, please call your pharmacy.

## 2018-08-17 LAB — TSH: TSH: 1.41 u[IU]/mL (ref 0.450–4.500)

## 2018-08-18 NOTE — Addendum Note (Signed)
Addended by: Dollene Primrose on: 08/18/2018 09:43 AM   Modules accepted: Orders

## 2018-08-28 ENCOUNTER — Other Ambulatory Visit: Payer: Self-pay | Admitting: *Deleted

## 2018-08-28 DIAGNOSIS — R002 Palpitations: Secondary | ICD-10-CM

## 2018-08-29 ENCOUNTER — Telehealth: Payer: Self-pay | Admitting: *Deleted

## 2018-08-29 NOTE — Telephone Encounter (Signed)
Patient aware of these recommendations. Scheduled her to see Dr Rockey Situ on 9/14 which is just over 4 weeks out.

## 2018-08-29 NOTE — Telephone Encounter (Signed)
-----   Message from Rise Mu, PA-C sent at 08/28/2018  4:24 PM EDT ----- Regarding: RE: ZIO report I heard back from Dr. Burt Knack. She really did not have much Afib. In that setting, risks of anticoagulation may outweigh benefit. No changes in therapy for now. Once her Elwyn Reach is read we should see her back. So, lets plan to have her come in around 4 weeks from now.   Thanks ----- Message ----- From: Vanessa Ralphs, RN Sent: 08/28/2018   3:13 PM EDT To: Rise Mu, PA-C Subject: ZIO report                                     Hi Ryan,  I just uploaded this ZIO report to Dr Donivan Scull basket. I see where you've seen her recently. It looks like she had post operative AFib and the ZIO shows less than 1% Afib burden on the monitor. Just wanted to make sure nothing needed to be done since Dr Rockey Situ is on vacation and not sure when he will read the report.  Thanks, [employer]

## 2018-09-01 ENCOUNTER — Encounter: Payer: Self-pay | Admitting: Thoracic Surgery (Cardiothoracic Vascular Surgery)

## 2018-09-06 ENCOUNTER — Telehealth: Payer: Self-pay | Admitting: Family Medicine

## 2018-09-06 ENCOUNTER — Telehealth: Payer: Self-pay

## 2018-09-06 NOTE — Telephone Encounter (Signed)
Last OV:  07/17/18  Last Refill:  07/18/18  Patient also has questions about Xanax medication.
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
## 2018-09-06 NOTE — Telephone Encounter (Signed)
Agree with prior documentation. Please let patient know if she wishes to defer anticoagulation at this time, she is at increased stroke risk. She can discuss this further with her primary cardiologist.

## 2018-09-06 NOTE — Telephone Encounter (Signed)
Patient also requesting a call back with some questions regarding this medication.

## 2018-09-06 NOTE — Telephone Encounter (Signed)
Medication: ALPRAZolam (XANAX) 0.25 MG tablet [    Patient is requesting refill. Patient is out of this medication.     Pharmacy:  [id number] - [place], [place] - [address] [phone] (Phone) [phone] (Fax)

## 2018-09-06 NOTE — Telephone Encounter (Signed)
I feel this patient may benefit from further discussion regarding the indications for start of anticoagulation, as well as the risks versus benefits of starting Eliquis 5mg  twice daily.  I recommended rescheduling her existing appointment with her primary cardiologist to an earlier date this month if possible.   Including primary cardiologist as an [place].

## 2018-09-06 NOTE — Telephone Encounter (Signed)
-----   Message from Rise Mu, PA-C sent at 09/01/2018 11:27 AM EDT ----- Cardiac monitor showed brief episode of A. fib representing less than 1% burden.  It appears patient's primary cardiologist would recommend patient start anticoagulation.  We have reached out to the TAVR clinic for their input as well.  On those recommendations, please have patient discontinue Plavix and start Eliquis 5 mg twice daily.  She is to remain on aspirin 81 mg daily until 6 months out from her TAVR.  Followed by that, she can be maintained on Eliquis as monotherapy.  Please have the patient schedule a follow-up appointment in the [place] office for further discussion of this.

## 2018-09-07 NOTE — Telephone Encounter (Signed)
The xanax was supposed to be a short term medication. Please see why she continues to need this. Please see if she has been taking the lexapro and if that has been helpful.

## 2018-09-07 NOTE — Telephone Encounter (Signed)
Spoke with patient and she remains concerned about the medications and instructions. She really would like to see Dr. Rockey Situ to discuss these concerns. Scheduled her for Monday and confirmed appointment time. She was appreciative for the call with no further questions.

## 2018-09-07 NOTE — Telephone Encounter (Signed)
Called and spoke with patient.  Pt said that she is still taking Lexapro (I/2 tab) and it has helped some.  Pt said that she uses Xanax only when she gets anxious.  Pt said that she has been overly anxious and unable to sleep in the past 2 or 3 days.  Pt said that she took her last Xanax pill 2 days ago.  Pt said that prior to last dose she had not taken Xanax for several weeks before.  Pt identifies cause of anxiousness from recent move of her niece back to [place] and her stopping smoking on June 14.  Pt said that she is no longer on the nicoderm patch because it caused her heart rate to increase.  Pt said she has been thinking about cigarettes.    Patient wants to know if Xanax would increase her heart rate.  Informed pt that information will be forwarded to PCP for advisement.

## 2018-09-09 NOTE — Progress Notes (Signed)
Cardiology Office Note  Date:  09/11/2018   ID:  Donna Rios, DOB 08-Dec-1950, MRN [id number]  PCP:  [name], MD   Chief Complaint  Patient presents with  . other    S/P TAVR questions.No CP no SOB no Swelling. Medications reviewed verbally.     HPI:  Ms. Donna Rios is a 68 y.o. woman with past medical history of Hyperlipidemia Smoking Severe aortic valve stenosis Venous insufficiency  Who presents to the office today for bicuspid AoV with severe AS s/p TAVR (07/11/18)  successful TAVR with a20mm Edwards Sapien 3 THV via the TF approach on 07/11/18. She had a brief run of Afib and was cardioverted back to sinus rhythm  increased anxiety from quitting smoking.  She also complained of palpations and a Zio patch was placed.   zio did show atrial fib, paroxysmal  CHADS VASC probably 4   stop plavix and start eliquis 5 mg BID. Continue ASA 81 mg daily until 6 months out from TAVR then OK to use eliquis alone  Treadmill at home, but does not use it She continues to smoke, currently less than 1 pack/day  Echo 07/2018 Aortic valve regurgitation is trivial . Mean gradient 10 mm Hg, peak gradient 24 mm Hg , estimated AVA 2.45 cm sq   echocardiogram 04/10/2018.   ejection fraction is normal (60 - 65%). The right side was also normal. Her aortic valve appears to have two leaflets rather than three. It had a valve area of 0.79 cm.  Markedly elevated mean and peak systolic pressure  Blood pressure 140/72 Total Chol 191/ LDL 93 CR 0.77 Glucose 106  EKG personally reviewed by myself on todays visit Shows normal sinus rhythm. 72 bpm. normal   OTHER PAST MEDICAL HISTORY REVIEWED BY ME FOR TODAY'S VISIT: Most recent echocardiogram in March 2020 Normal ejection fraction, 60 - 65% Left ventricle normal Right ventricle normal Mean gradient 60.5 mmHg Peak gradient 96.4 mmHg Aortic valve is bicuspid rather than tricuspid Aortic valve area 0.79 cm  Echocardiogram May  2019 Normal ejection fraction Mean gradient 46 mmHg Peak gradient 73 mmHg Take velocity 432 cm per sec and Valve area less than 1 cm  6 surgeries on left foot, walks slower but still very active.   Retired, does garden work, worked in a [employer] for 32 yrs.   PMH:   has a past medical history of Allergic rhinitis, Arthritis, Fatty liver, Hypertension, Phlebitis, S/P TAVR (transcatheter aortic valve replacement), Severe aortic stenosis, Stress incontinence, and Tobacco abuse.  Aortic valve stenosis Smoker  PSH:    Past Surgical History:  Procedure Laterality Date  . BREAST BIOPSY Left    ducts removed  . Cataract surgery    . COLONOSCOPY WITH PROPOFOL N/A 09/29/2017   Procedure: COLONOSCOPY WITH PROPOFOL;  Surgeon: Lin Landsman, MD;  Location: [facility] ENDOSCOPY;  Service: Gastroenterology;  Laterality: N/A;  . EYE SURGERY    . FOOT SURGERY     7  . INTRAOPERATIVE TRANSTHORACIC ECHOCARDIOGRAM N/A 07/11/2018   Procedure: Intraoperative Transthoracic Echocardiogram;  Surgeon: Sherren Mocha, MD;  Location: [place];  Service: Open Heart Surgery;  Laterality: N/A;  . KNEE ARTHROSCOPY Right   . LAPAROSCOPIC HYSTERECTOMY    . RIGHT HEART CATH AND CORONARY ANGIOGRAPHY N/A 06/15/2018   Procedure: RIGHT HEART CATH AND CORONARY ANGIOGRAPHY;  Surgeon: Sherren Mocha, MD;  Location: [place] CV LAB;  Service: Cardiovascular;  Laterality: N/A;  . TRANSCATHETER AORTIC VALVE REPLACEMENT, TRANSFEMORAL  07/11/2018  .  TRANSCATHETER AORTIC VALVE REPLACEMENT, TRANSFEMORAL N/A 07/11/2018   Procedure: TRANSCATHETER AORTIC VALVE REPLACEMENT, TRANSFEMORAL;  Surgeon: Sherren Mocha, MD;  Location: [place];  Service: Open Heart Surgery;  Laterality: N/A;  . VARICOSE VEIN SURGERY      Current Outpatient Medications  Medication Sig Dispense Refill  . acetaminophen (TYLENOL) 650 MG CR tablet Take 650 mg by mouth every 8 (eight) hours as needed for pain.     Marland Kitchen ALPRAZolam (XANAX) 0.25 MG tablet Take 1 tablet  (0.25 mg total) by mouth every 8 (eight) hours as needed for anxiety. 20 tablet 0  . amoxicillin (AMOXIL) 500 MG tablet Take 4 tablets (2,000 mg total) by mouth as directed. 1 hour prior to dental work, including cleanings 12 tablet 12  . APPLE CIDER VINEGAR PO Take 1 tablet by mouth daily as needed (with fatty foods).     Marland Kitchen aspirin EC 81 MG EC tablet Take 1 tablet (81 mg total) by mouth daily.    Marland Kitchen b complex vitamins tablet Take 1 tablet by mouth every other day.    . Calcium Carbonate-Vit D-Min (CALCIUM 1200 PO) Take 1,200 mg by mouth daily.     . cetirizine (ZYRTEC) 10 MG tablet Take 10 mg by mouth as needed.     . clopidogrel (PLAVIX) 75 MG tablet Take 1 tablet (75 mg total) by mouth daily with breakfast. 90 tablet 1  . escitalopram (LEXAPRO) 10 MG tablet Take 0.5 tablets (5 mg total) by mouth daily. 45 tablet 1  . fluticasone (FLONASE) 50 MCG/ACT nasal spray Place 1 spray into both nostrils daily as needed for allergies.     . Menthol, Topical Analgesic, (ICY HOT EX) Apply 1 application topically 4 (four) times daily as needed (muscle pain).     . Multiple Vitamin (MULTIVITAMIN WITH MINERALS) TABS tablet Take 1 tablet by mouth daily. Women's Complete Multivitamin 50+    . Probiotic Product (PROBIOTIC ADVANCED PO) Take 1 tablet by mouth every other day.     Marland Kitchen Propylene Glycol (SYSTANE COMPLETE OP) Place 1 drop into both eyes 3 (three) times daily as needed (dry/irritated eyes.).     Marland Kitchen rosuvastatin (CRESTOR) 40 MG tablet Take 1 tablet (40 mg total) by mouth daily. 90 tablet 2  . sodium chloride (OCEAN) 0.65 % SOLN nasal spray Place 1 spray into both nostrils 4 (four) times daily as needed for congestion.      No current facility-administered medications for this visit.      Allergies:   Ibuprofen, Asa [aspirin], and Iohexol   Social History:  The patient  reports that she has quit smoking. Her smoking use included cigarettes. She has a 23.00 pack-year smoking history. She has never used  smokeless tobacco. She reports previous alcohol use. She reports that she does not use drugs.   Family History:   family history includes Alcoholism in an other family member; Arthritis in an other family member; Diabetes in her mother and another family member; Heart disease in an other family member; Heart failure in her father and mother; Hypertension in her mother and another family member; Hypotension in her father; Lung cancer in an other family member; Stroke in an other family member.    Review of Systems: Review of Systems  Constitutional: Positive for malaise/fatigue.  Eyes: Negative.   Respiratory: Negative.   Cardiovascular: Negative.   Gastrointestinal: Negative.   Genitourinary: Negative.   Musculoskeletal: Negative.   Neurological: Negative.   Psychiatric/Behavioral: Negative.   All other systems reviewed and  are negative.   PHYSICAL EXAM: VS:  BP 140/82 (BP Location: Left Arm, Patient Position: Sitting, Cuff Size: Normal)   Pulse 72   Ht 5\' 8"  (1.727 m)   Wt 217 lb (98.4 kg)   SpO2 98%   BMI 32.99 kg/m  , BMI Body mass index is 32.99 kg/m.  Constitutional:  oriented to person, place, and time. No distress.  HENT: normal Head: Grossly normal Eyes:  no discharge. No scleral icterus.  Neck: No JVD, no carotid bruits  Cardiovascular: Regular rate and rhythm,no significant murmur Pulmonary/Chest: Clear to auscultation bilaterally, no wheezes or rales Abdominal: Soft.  no distension.  no tenderness.  Musculoskeletal: Normal range of motion Neurological:  normal muscle tone. Coordination normal. No atrophy Skin: Skin warm and dry Psychiatric: normal affect, pleasant  Recent Labs: 07/07/2018: B Natriuretic Peptide 63.7 07/12/2018: Magnesium 1.9 07/25/2018: ALT 20; BUN 15; Creatinine, Ser 0.63; Hemoglobin 11.7; Platelets 226; Potassium 4.6; Sodium 139 08/16/2018: TSH 1.410    Lipid Panel Lab Results  Component Value Date   CHOL 164 07/25/2018   HDL 51  07/25/2018   LDLCALC 63 07/25/2018   TRIG 248 (H) 07/25/2018      Wt Readings from Last 3 Encounters:  09/11/18 217 lb (98.4 kg)  08/16/18 215 lb 12.8 oz (97.9 kg)  07/25/18 215 lb 12 oz (97.9 kg)     ASSESSMENT AND PLAN:  Severe aortic valve stenosis S/p TAVR Echo 07/2018  Paroxysmal atrial fibrillation Long discussion with her on visit today concerning risk and benefit of anticoagulation, risk of stroke She reports her husband has atrial fibrillation and has declined anticoagulation She is more interested, does not want to have a stroke After further discussion we will hold the Plavix, she will continue on aspirin 81 mg daily and start eliquis 5 BID Cost may be an issue.  She will check with her pharmacy  Tobacco abuse We have encouraged her to continue to work on weaning her cigarettes and smoking cessation. She will continue to work on this and does not want any assistance with chantix.   Prediabetes Unable to exercise, weight running higher She has started walking Has chronic shortness of breath Recommended walking may help her conditioning and anxiety  Mixed hyperlipidemia Continue Crestor Goal LDL less than 70, high risk of coronary disease LDL is 63  Disposition:   F/U  12 months   Total encounter time more than 25 minutes  Greater than 50% was spent in counseling and coordination of care with the patient    Orders Placed This Encounter  Procedures  . EKG 12-Lead     Signed, Esmond Plants, M.D., Ph.D. 09/11/2018  [place], [place]

## 2018-09-09 NOTE — Telephone Encounter (Signed)
I would suggest we increase the lexapro dose for her anxiety. The xanax could potentially increase her heart rate.

## 2018-09-11 ENCOUNTER — Ambulatory Visit (INDEPENDENT_AMBULATORY_CARE_PROVIDER_SITE_OTHER): Payer: Medicare Other | Admitting: Cardiovascular Disease

## 2018-09-11 ENCOUNTER — Other Ambulatory Visit: Payer: Self-pay

## 2018-09-11 ENCOUNTER — Encounter: Payer: Self-pay | Admitting: Cardiovascular Disease

## 2018-09-11 VITALS — BP 140/82 | HR 72 | Ht 68.0 in | Wt 217.0 lb

## 2018-09-11 DIAGNOSIS — I350 Nonrheumatic aortic (valve) stenosis: Secondary | ICD-10-CM

## 2018-09-11 DIAGNOSIS — I480 Paroxysmal atrial fibrillation: Secondary | ICD-10-CM

## 2018-09-11 DIAGNOSIS — Z952 Presence of prosthetic heart valve: Secondary | ICD-10-CM | POA: Diagnosis not present

## 2018-09-11 DIAGNOSIS — Z720 Tobacco use: Secondary | ICD-10-CM

## 2018-09-11 DIAGNOSIS — I10 Essential (primary) hypertension: Secondary | ICD-10-CM | POA: Diagnosis not present

## 2018-09-11 DIAGNOSIS — I2510 Atherosclerotic heart disease of native coronary artery without angina pectoris: Secondary | ICD-10-CM

## 2018-09-11 DIAGNOSIS — E782 Mixed hyperlipidemia: Secondary | ICD-10-CM

## 2018-09-11 MED ORDER — APIXABAN 5 MG PO TABS: 5.0000 mg | ORAL_TABLET | Freq: Two times a day (BID) | ORAL | 11 refills | Status: DC

## 2018-09-11 NOTE — Patient Instructions (Addendum)
Medication Instructions:  Your physician has recommended you make the following change in your medication:  1. START Eliquis 5 mg twice a day  2. STOP Plavix (Clopidigrel)  3.CONTINUE Aspirin 81 mg once daily   If you need a refill on your cardiac medications before your next appointment, please call your pharmacy.    Lab work: No new labs needed   If you have labs (blood work) drawn today and your tests are completely normal, you will receive your results only by: Marland Kitchen MyChart Message (if you have MyChart) OR . A paper copy in the mail If you have any lab test that is abnormal or we need to change your treatment, we will call you to review the results.   Testing/Procedures: No new testing needed   Follow-Up: At [facility], you and your health needs are our priority.  As part of our continuing mission to provide you with exceptional heart care, we have created designated Provider Care Teams.  These Care Teams include your primary Cardiologist (physician) and Advanced Practice Providers (APPs -  Physician Assistants and Nurse Practitioners) who all work together to provide you with the care you need, when you need it.  . You will need a follow up appointment in 12 months .   Please call our office 2 months in advance to schedule this appointment.    . Providers on your designated Care Team:   . Murray Hodgkins, NP . Christell Faith, PA-C . Marrianne Mood, PA-C  Any Other Special Instructions Will Be Listed Below (If Applicable).  For educational health videos Log in to : [email or web address] Or : [email or web address], password : [email or web address]

## 2018-09-13 NOTE — Telephone Encounter (Signed)
Patient said that she went to her cardiologist after being on the home heart monitor for two weeks.  Patient said that her heart monitor showed Afib for about 38 seconds.  Patient said that she is now on eliquis (5 mg twice a day) and was taken off of plavix.  Patient said PCP should be able to review results in her chart.  Patient wants to know if it is ok to increase Lexapro based on current medication changes.

## 2018-09-14 NOTE — Telephone Encounter (Signed)
It should be ok to increase the dose of the lexapro. I can send the new dose to her pharmacy if she is willing. Thanks.

## 2018-09-15 NOTE — Telephone Encounter (Signed)
Pt retruning call to the office.  Pt has been scheduled for 9/21 at 1:40 pm. Pt states she is going to increase the lexapro to the whole pill (10 mg). Pt states she will need a new Rx for the new dosage  Sent to  [id number] - [place], [place] - [address] [phone] (Phone) [phone] (Fax)    Pt does say that if this 10 mg seem to be too much, she will cut back, let the dr know, and see you on 9/21

## 2018-09-15 NOTE — Telephone Encounter (Signed)
Left me message for patient to return call to office. PEC nurse may advise.

## 2018-10-08 ENCOUNTER — Other Ambulatory Visit: Payer: Self-pay | Admitting: Family Medicine

## 2018-10-08 NOTE — Progress Notes (Signed)
Cardiology Office Note  Date:  10/09/2018   ID:  Donna Rios, DOB November 30, 1950, MRN [id number]  PCP:  [name], MD   Chief Complaint  Patient presents with  . other    4 week f/u. post TAVR,S/P Zio. Medications reviewed verbally.     HPI:  Donna Rios is a 68 y.o. woman with past medical history of Hyperlipidemia Smoking, quit 6/20 Severe aortic valve stenosis Venous insufficiency  Who presents to the office today for bicuspid AoV with severe AS s/p TAVR (07/11/18)  Fells well,  Some throbbing in the head, thinks it is from better flow to her brain after valve surgery More active over the past several months "I need to exercise" Weight still running high  Rare vision issues, wonders if it could be atrial fib  Brother is sick in [place], ETOH/smoker  Tolerating eliquis Paroxysmal atrial fib   07/11/18. TAVR with a82mm Edwards Sapien 3 THV via the TF approach on She had a brief run of Afib and was cardioverted back to sinus rhythm  zio did show atrial fib, paroxysmal  CHADS VASC probably 4   stop plavix and start eliquis 5 mg BID. Continue ASA 81 mg daily until 6 months out from TAVR then OK to use eliquis alone  Treadmill at home, but does not use it Quit smoking  Echo 07/2018 Aortic valve regurgitation is trivial . Mean gradient 10 mm Hg, peak gradient 24 mm Hg , estimated AVA 2.45 cm sq   echocardiogram 04/10/2018.   ejection fraction is normal (60 - 65%). The right side was also normal. Her aortic valve appears to have two leaflets rather than three. It had a valve area of 0.79 cm.  Markedly elevated mean and peak systolic pressure  Blood pressure 140/72 Total Chol 191/ LDL 93 CR 0.77 Glucose 106  EKG personally reviewed by myself on todays visit Shows normal sinus rhythm. 73 bpm. No ST ABn   OTHER PAST MEDICAL HISTORY REVIEWED BY ME FOR TODAY'S VISIT: Most recent echocardiogram in March 2020 Normal ejection fraction, 60 - 65% Left  ventricle normal Right ventricle normal Mean gradient 60.5 mmHg Peak gradient 96.4 mmHg Aortic valve is bicuspid rather than tricuspid Aortic valve area 0.79 cm  Echocardiogram May 2019 Normal ejection fraction Mean gradient 46 mmHg Peak gradient 73 mmHg Take velocity 432 cm per sec and Valve area less than 1 cm  6 surgeries on left foot, walks slower but still very active.   Retired, does garden work, worked in a [employer] for 32 yrs.   PMH:   has a past medical history of Allergic rhinitis, Arthritis, Fatty liver, Hypertension, Phlebitis, S/P TAVR (transcatheter aortic valve replacement), Severe aortic stenosis, Stress incontinence, and Tobacco abuse.  Aortic valve stenosis Smoker  PSH:    Past Surgical History:  Procedure Laterality Date  . BREAST BIOPSY Left    ducts removed  . Cataract surgery    . COLONOSCOPY WITH PROPOFOL N/A 09/29/2017   Procedure: COLONOSCOPY WITH PROPOFOL;  Surgeon: Lin Landsman, MD;  Location: [facility] ENDOSCOPY;  Service: Gastroenterology;  Laterality: N/A;  . EYE SURGERY    . FOOT SURGERY     7  . INTRAOPERATIVE TRANSTHORACIC ECHOCARDIOGRAM N/A 07/11/2018   Procedure: Intraoperative Transthoracic Echocardiogram;  Surgeon: Sherren Mocha, MD;  Location: [place];  Service: Open Heart Surgery;  Laterality: N/A;  . KNEE ARTHROSCOPY Right   . LAPAROSCOPIC HYSTERECTOMY    . RIGHT HEART CATH AND CORONARY ANGIOGRAPHY N/A 06/15/2018  Procedure: RIGHT HEART CATH AND CORONARY ANGIOGRAPHY;  Surgeon: Sherren Mocha, MD;  Location: [place] CV LAB;  Service: Cardiovascular;  Laterality: N/A;  . TRANSCATHETER AORTIC VALVE REPLACEMENT, TRANSFEMORAL  07/11/2018  . TRANSCATHETER AORTIC VALVE REPLACEMENT, TRANSFEMORAL N/A 07/11/2018   Procedure: TRANSCATHETER AORTIC VALVE REPLACEMENT, TRANSFEMORAL;  Surgeon: Sherren Mocha, MD;  Location: [place];  Service: Open Heart Surgery;  Laterality: N/A;  . VARICOSE VEIN SURGERY      Current Outpatient Medications   Medication Sig Dispense Refill  . acetaminophen (TYLENOL) 650 MG CR tablet Take 650 mg by mouth every 8 (eight) hours as needed for pain.     Marland Kitchen amoxicillin (AMOXIL) 500 MG tablet Take 4 tablets (2,000 mg total) by mouth as directed. 1 hour prior to dental work, including cleanings 12 tablet 12  . apixaban (ELIQUIS) 5 MG TABS tablet Take 1 tablet (5 mg total) by mouth 2 (two) times daily. 60 tablet 11  . APPLE CIDER VINEGAR PO Take 1 tablet by mouth daily as needed (with fatty foods).     Marland Kitchen aspirin EC 81 MG EC tablet Take 1 tablet (81 mg total) by mouth daily.    Marland Kitchen b complex vitamins tablet Take 1 tablet by mouth every other day.    . Calcium Carbonate-Vit D-Min (CALCIUM 1200 PO) Take 1,200 mg by mouth daily.     . cetirizine (ZYRTEC) 10 MG tablet Take 10 mg by mouth as needed.     Marland Kitchen escitalopram (LEXAPRO) 10 MG tablet Take 0.5 tablets (5 mg total) by mouth daily. (Patient taking differently: Take 10 mg by mouth daily. ) 45 tablet 1  . fluticasone (FLONASE) 50 MCG/ACT nasal spray Place 1 spray into both nostrils daily as needed for allergies.     . Menthol, Topical Analgesic, (ICY HOT EX) Apply 1 application topically 4 (four) times daily as needed (muscle pain).     . Multiple Vitamin (MULTIVITAMIN WITH MINERALS) TABS tablet Take 1 tablet by mouth daily. Women's Complete Multivitamin 50+    . Probiotic Product (PROBIOTIC ADVANCED PO) Take 1 tablet by mouth every other day.     Marland Kitchen Propylene Glycol (SYSTANE COMPLETE OP) Place 1 drop into both eyes 3 (three) times daily as needed (dry/irritated eyes.).     Marland Kitchen rosuvastatin (CRESTOR) 40 MG tablet Take 1 tablet (40 mg total) by mouth daily. 90 tablet 2  . sodium chloride (OCEAN) 0.65 % SOLN nasal spray Place 1 spray into both nostrils 4 (four) times daily as needed for congestion.      No current facility-administered medications for this visit.      Allergies:   Ibuprofen, Asa [aspirin], and Iohexol   Social History:  The patient  reports that she  has quit smoking. Her smoking use included cigarettes. She has a 23.00 pack-year smoking history. She has never used smokeless tobacco. She reports previous alcohol use. She reports that she does not use drugs.   Family History:   family history includes Alcoholism in an other family member; Arthritis in an other family member; Diabetes in her mother and another family member; Heart disease in an other family member; Heart failure in her father and mother; Hypertension in her mother and another family member; Hypotension in her father; Lung cancer in an other family member; Stroke in an other family member.    Review of Systems: Review of Systems  Constitutional: Positive for malaise/fatigue.  Eyes: Negative.   Respiratory: Negative.   Cardiovascular: Negative.   Gastrointestinal: Negative.  Genitourinary: Negative.   Musculoskeletal: Negative.   Neurological: Negative.   Psychiatric/Behavioral: Negative.   All other systems reviewed and are negative.   PHYSICAL EXAM: VS:  BP (!) 142/82 (BP Location: Left Arm, Patient Position: Sitting, Cuff Size: Normal)   Pulse 76   Ht 5\' 8"  (1.727 m)   Wt 216 lb (98 kg)   SpO2 98%   BMI 32.84 kg/m  , BMI Body mass index is 32.84 kg/m.  Constitutional:  oriented to person, place, and time. No distress.  HENT:  Head: Grossly normal Eyes:  no discharge. No scleral icterus.  Neck: No JVD, no carotid bruits  Cardiovascular: Regular rate and rhythm, no murmurs appreciated Pulmonary/Chest: Clear to auscultation bilaterally, no wheezes or rails Abdominal: Soft.  no distension.  no tenderness.  Musculoskeletal: Normal range of motion Neurological:  normal muscle tone. Coordination normal. No atrophy Skin: Skin warm and dry Psychiatric: normal affect, pleasant  Recent Labs: 07/07/2018: B Natriuretic Peptide 63.7 07/12/2018: Magnesium 1.9 07/25/2018: ALT 20; BUN 15; Creatinine, Ser 0.63; Hemoglobin 11.7; Platelets 226; Potassium 4.6; Sodium  139 08/16/2018: TSH 1.410    Lipid Panel Lab Results  Component Value Date   CHOL 164 07/25/2018   HDL 51 07/25/2018   LDLCALC 63 07/25/2018   TRIG 248 (H) 07/25/2018      Wt Readings from Last 3 Encounters:  10/09/18 216 lb (98 kg)  09/11/18 217 lb (98.4 kg)  08/16/18 215 lb 12.8 oz (97.9 kg)     ASSESSMENT AND PLAN:  Severe aortic valve stenosis S/p TAVR Echo 07/2018 Doing well, repeat echocardiogram in 1 year, discussed with her  Paroxysmal atrial fibrillation On asa 81 daily with eliquis 5 BID NSR today Not very symptomatic Was seen on event monitor  Tobacco abuse Stressed importance of smoking cessation  Prediabetes Suggested she start walking program, strict diet  Mixed hyperlipidemia Continue Crestor LDL is 63  Disposition:   F/U  12 months   Total encounter time more than 25 minutes  Greater than 50% was spent in counseling and coordination of care with the patient    Orders Placed This Encounter  Procedures  . EKG 12-Lead     Signed, Esmond Plants, M.D., Ph.D. 10/09/2018  [place], [place]

## 2018-10-09 ENCOUNTER — Encounter: Payer: Self-pay | Admitting: Cardiovascular Disease

## 2018-10-09 ENCOUNTER — Ambulatory Visit (INDEPENDENT_AMBULATORY_CARE_PROVIDER_SITE_OTHER): Payer: Medicare Other | Admitting: Cardiovascular Disease

## 2018-10-09 ENCOUNTER — Other Ambulatory Visit: Payer: Self-pay

## 2018-10-09 VITALS — BP 142/82 | HR 76 | Ht 68.0 in | Wt 216.0 lb

## 2018-10-09 DIAGNOSIS — I480 Paroxysmal atrial fibrillation: Secondary | ICD-10-CM | POA: Diagnosis not present

## 2018-10-09 DIAGNOSIS — Z952 Presence of prosthetic heart valve: Secondary | ICD-10-CM | POA: Diagnosis not present

## 2018-10-09 DIAGNOSIS — I2510 Atherosclerotic heart disease of native coronary artery without angina pectoris: Secondary | ICD-10-CM

## 2018-10-09 DIAGNOSIS — I350 Nonrheumatic aortic (valve) stenosis: Secondary | ICD-10-CM

## 2018-10-09 DIAGNOSIS — Z720 Tobacco use: Secondary | ICD-10-CM

## 2018-10-09 DIAGNOSIS — E782 Mixed hyperlipidemia: Secondary | ICD-10-CM

## 2018-10-09 DIAGNOSIS — I10 Essential (primary) hypertension: Secondary | ICD-10-CM | POA: Diagnosis not present

## 2018-10-09 NOTE — Patient Instructions (Signed)

## 2018-10-12 ENCOUNTER — Other Ambulatory Visit: Payer: Self-pay

## 2018-10-16 ENCOUNTER — Other Ambulatory Visit: Payer: Self-pay

## 2018-10-16 ENCOUNTER — Encounter: Payer: Self-pay | Admitting: Family Medicine

## 2018-10-16 ENCOUNTER — Ambulatory Visit (INDEPENDENT_AMBULATORY_CARE_PROVIDER_SITE_OTHER): Payer: Medicare Other | Admitting: Family Medicine

## 2018-10-16 VITALS — BP 150/90 | HR 82 | Temp 97.0°F | Ht 67.5 in | Wt 216.0 lb

## 2018-10-16 DIAGNOSIS — L659 Nonscarring hair loss, unspecified: Secondary | ICD-10-CM | POA: Diagnosis not present

## 2018-10-16 DIAGNOSIS — F329 Major depressive disorder, single episode, unspecified: Secondary | ICD-10-CM

## 2018-10-16 DIAGNOSIS — R030 Elevated blood-pressure reading, without diagnosis of hypertension: Secondary | ICD-10-CM | POA: Diagnosis not present

## 2018-10-16 DIAGNOSIS — B372 Candidiasis of skin and nail: Secondary | ICD-10-CM

## 2018-10-16 DIAGNOSIS — F419 Anxiety disorder, unspecified: Secondary | ICD-10-CM | POA: Diagnosis not present

## 2018-10-16 DIAGNOSIS — M7542 Impingement syndrome of left shoulder: Secondary | ICD-10-CM | POA: Diagnosis not present

## 2018-10-16 DIAGNOSIS — N644 Mastodynia: Secondary | ICD-10-CM

## 2018-10-16 LAB — TSH: TSH: 0.81 u[IU]/mL (ref 0.35–4.50)

## 2018-10-16 LAB — T3, FREE: T3, Free: 3.7 pg/mL (ref 2.3–4.2)

## 2018-10-16 LAB — T4, FREE: Free T4: 0.92 ng/dL (ref 0.60–1.60)

## 2018-10-16 NOTE — Patient Instructions (Signed)
Nice to see you. Please do the exercises for your shoulder. Please try the cream for your groin. We will check labs. We will get a mammogram and ultrasound to evaluate your breast discomfort.

## 2018-10-18 ENCOUNTER — Telehealth: Payer: Self-pay | Admitting: Family Medicine

## 2018-10-18 MED ORDER — KETOCONAZOLE 2 % EX CREA: 1.0000 "application " | TOPICAL_CREAM | Freq: Every day | CUTANEOUS | 0 refills | Status: DC

## 2018-10-18 NOTE — Telephone Encounter (Signed)
Pt states at her Monday appointment Dr. [name] told her that she would have cream called in for her to the pharmacy.  However, pt's pharmacy is telling her that there are no medications that have been called in for her. She would like to check on that.  Pt also states that Dr. [name] told her he would be setting up a breast mammogram for her and she hasn't heard back on that either.  Pt can be reached at [phone]

## 2018-10-18 NOTE — Telephone Encounter (Signed)
I called and spoke with patient and informed her that the provider must have forgot the cream but he will do this today and the referral for her mammogram is there and they will call her to schedule.  Pt understood.  Zanyah Lentsch,cma

## 2018-10-18 NOTE — Telephone Encounter (Signed)
Sent to pharmacy 

## 2018-10-18 NOTE — Telephone Encounter (Signed)
Pt states at her Monday appointment Dr. [name] told her that she would have cream called in for her to the pharmacy.  However, pt's pharmacy is telling her that there are no medications that have been called in for her. She would like to check on that.  Davine Coba,cma

## 2018-10-21 DIAGNOSIS — B372 Candidiasis of skin and nail: Secondary | ICD-10-CM | POA: Insufficient documentation

## 2018-10-21 DIAGNOSIS — N644 Mastodynia: Secondary | ICD-10-CM | POA: Insufficient documentation

## 2018-10-21 DIAGNOSIS — L659 Nonscarring hair loss, unspecified: Secondary | ICD-10-CM | POA: Insufficient documentation

## 2018-10-21 NOTE — Assessment & Plan Note (Signed)
This continues to be an issue.  Relatively stable on Lexapro.  She will continue Lexapro.

## 2018-10-21 NOTE — Assessment & Plan Note (Signed)
Suspect rotator cuff impingement.  She will try home exercises.  Consider injection if not improving with exercises.

## 2018-10-21 NOTE — Assessment & Plan Note (Addendum)
Mammogram and ultrasound ordered.  Patient is concerned about her TAVR and whether or not she can have a mammogram.  I advised her that this was likely okay though I would reach out to her cardiologist to confirm.

## 2018-10-21 NOTE — Assessment & Plan Note (Signed)
Blood pressure has been mostly well controlled at home.  She will continue to monitor and if it is consistently elevated she will let us know.

## 2018-10-21 NOTE — Assessment & Plan Note (Signed)
Based on patient's description I feel this is likely the cause of her groin issues.  She will trial nystatin.

## 2018-10-21 NOTE — Assessment & Plan Note (Signed)
Check TSH and free T3 and free T4.

## 2018-10-21 NOTE — Progress Notes (Signed)
[name], MD Phone: [phone]  Donna Rios is a 68 y.o. female who presents today for follow-up.  Left shoulder pain: Patient notes she had an injection previously and this did improve.  She is starting to get a little discomfort in it again.  She declined seeing orthopedics now.  Depression: She does have her moments.  She tries not to watch TV as that does make it worse.  She is stressed with the isolation.  No SI.  Lexapro has been helpful.  Hair thinning: She notes this has been going on for some time now.  Her scalp does itch with this.  Elevated blood pressure: Intermittently elevated though mostly well controlled.  Ranging from 113-146/63-93.  She wonders if it is related to the Eliquis.  She has some chest soreness while she is asleep or if she has been sitting for too long.  She notes she will burp and it will resolve.  Feels like a cramp.  No exertional pain.  No shortness of breath.  Left upper breast tenderness and swelling: Patient notes an area of fullness in her upper left breast.  She notes her nipple is sore as well.  She had a duct removed previously.  She is due for mammogram.  She wonders if she can have the mammogram with her TAVR.  Groin rash: Patient notes a little bit of a rash in her bilateral groin right greater than left in the creases.  Does have an odor to it.  She has not been showering daily.  Typically gets this if she does not clean adequately.  Social History   Tobacco Use  Smoking Status Former Smoker  . Packs/day: 0.50  . Years: 46.00  . Pack years: 23.00  . Types: Cigarettes  Smokeless Tobacco Never Used  Tobacco Comment   stopped 06/11/2018...smokes 1-10 per day     ROS see history of present illness  Objective  Physical Exam Vitals:   10/16/18 1359  BP: (!) 150/90  Pulse: 82  Temp: (!) 97 F (36.1 C)  SpO2: 97%    BP Readings from Last 3 Encounters:  10/16/18 (!) 150/90  10/09/18 (!) 142/82  09/11/18 140/82   Wt  Readings from Last 3 Encounters:  10/16/18 216 lb (98 kg)  10/09/18 216 lb (98 kg)  09/11/18 217 lb (98.4 kg)    Physical Exam Constitutional:      General: She is not in acute distress.    Appearance: She is not diaphoretic.  Cardiovascular:     Rate and Rhythm: Normal rate and regular rhythm.     Heart sounds: Normal heart sounds.  Pulmonary:     Effort: Pulmonary effort is normal.     Breath sounds: Normal breath sounds.  Genitourinary:    Comments: Chaperone used, slight tenderness of the left upper breast, nipple is mildly sore on palpation, no nipple inversion bilaterally, no other tenderness in either breast, no skin changes, no masses palpated, no axillary masses bilaterally Skin:    General: Skin is warm and dry.     Comments: No apparent rash in either groin, no odor noted  Neurological:     Mental Status: She is alert.      Assessment/Plan: Please see individual problem list.  Elevated BP without diagnosis of hypertension Blood pressure has been mostly well controlled at home.  She will continue to monitor and if it is consistently elevated she will let us know.  Anxiety and depression This continues to be an issue.  Relatively stable on Lexapro.  She will continue Lexapro.  Rotator cuff impingement syndrome of left shoulder Suspect rotator cuff impingement.  She will try home exercises.  Consider injection if not improving with exercises.  Breast pain, left Mammogram and ultrasound ordered.  Patient is concerned about her TAVR and whether or not she can have a mammogram.  I advised her that this was likely okay though I would reach out to her cardiologist to confirm.  Hair loss Check TSH and free T3 and free T4.  Candidal intertrigo Based on patient's description I feel this is likely the cause of her groin issues.  She will trial nystatin.   Orders Placed This Encounter  Procedures  . MM DIAG BREAST TOMO BILATERAL    Standing Status:   Future     Standing Expiration Date:   12/16/2019    Order Specific Question:   Reason for Exam (SYMPTOM  OR DIAGNOSIS REQUIRED)    Answer:   left nipple tenderness, left upper breast tenderness and fullness    Order Specific Question:   Preferred imaging location?    Answer:   [facility]  . US BREAST LTD UNI LEFT INC AXILLA    Standing Status:   Future    Standing Expiration Date:   12/16/2019    Order Specific Question:   Reason for Exam (SYMPTOM  OR DIAGNOSIS REQUIRED)    Answer:   left nipple tenderness, left upper breast tenderness and fullness    Order Specific Question:   Preferred imaging location?    Answer:   [facility]  . TSH  . T3, free  . T4, free    No orders of the defined types were placed in this encounter.    [name], MD [place]

## 2018-10-23 ENCOUNTER — Telehealth: Payer: Self-pay | Admitting: Family Medicine

## 2018-10-23 NOTE — Telephone Encounter (Signed)
Please let the patient know that I checked with her cardiologist and they noted that the mammogram would be okay with her TAVR.  Thanks.

## 2018-10-23 NOTE — Telephone Encounter (Signed)
-----   Message from Minna Merritts, MD sent at 10/22/2018  7:52 PM EDT ----- Mammo ok with TAVR Thx TG ----- Message ----- From: [name], MD Sent: 10/21/2018  12:13 PM EDT To: Minna Merritts, MD  Hi Dr Rockey Situ,   I saw Mrs Nunnery for follow-up this past week. I am ordering a mammogram on her to evaluate breast tenderness. She was concerned about whether or not she could have a mammogram after her TAVR. She wanted me to confirm with you whether or not this would be ok. Thanks for your help.  Randall Hiss

## 2018-10-27 ENCOUNTER — Telehealth: Payer: Self-pay | Admitting: *Deleted

## 2018-10-27 ENCOUNTER — Encounter: Payer: Self-pay | Admitting: *Deleted

## 2018-10-27 DIAGNOSIS — Z122 Encounter for screening for malignant neoplasm of respiratory organs: Secondary | ICD-10-CM

## 2018-10-27 DIAGNOSIS — Z87891 Personal history of nicotine dependence: Secondary | ICD-10-CM

## 2018-10-27 NOTE — Telephone Encounter (Signed)
Patient has been notified that annual lung cancer screening low dose CT scan is due currently or will be in near future. Confirmed that patient is within the age range of 55-77, and asymptomatic, (no signs or symptoms of lung cancer). Patient denies illness that would prevent curative treatment for lung cancer if found. Verified smoking history, (former, quit 07/09/18, 46.5 pack year). The shared decision making visit was done 05/25/16. Patient is agreeable for CT scan being scheduled.

## 2018-11-01 ENCOUNTER — Other Ambulatory Visit: Payer: Self-pay

## 2018-11-01 ENCOUNTER — Ambulatory Visit
Admission: RE | Admit: 2018-11-01 | Discharge: 2018-11-01 | Disposition: A | Discharge status: Home / Self Care | Payer: Medicare Other | Source: Ambulatory Visit | Attending: Nurse Practitioner | Admitting: Nurse Practitioner

## 2018-11-01 DIAGNOSIS — Z87891 Personal history of nicotine dependence: Secondary | ICD-10-CM

## 2018-11-01 DIAGNOSIS — Z122 Encounter for screening for malignant neoplasm of respiratory organs: Secondary | ICD-10-CM | POA: Diagnosis not present

## 2018-11-01 NOTE — Telephone Encounter (Signed)
I spoke with patient & made her aware of this. Patient was questions if insurance was covering & I informed that I could not guarantee what or if she would have a bill. She also questioned why the mammo was scheduled before the [place]. I told her that she could call [facility] & ask, but I was unsure unless it was just bc of appointment availibility or something else.

## 2018-11-06 ENCOUNTER — Encounter: Payer: Self-pay | Admitting: *Deleted

## 2018-11-07 ENCOUNTER — Ambulatory Visit
Admission: RE | Admit: 2018-11-07 | Discharge: 2018-11-07 | Disposition: A | Discharge status: Home / Self Care | Payer: Medicare Other | Source: Ambulatory Visit | Attending: Family Medicine | Admitting: Family Medicine

## 2018-11-07 DIAGNOSIS — N644 Mastodynia: Secondary | ICD-10-CM

## 2018-11-07 DIAGNOSIS — R922 Inconclusive mammogram: Secondary | ICD-10-CM | POA: Diagnosis not present

## 2018-11-23 ENCOUNTER — Other Ambulatory Visit: Payer: Self-pay

## 2018-11-27 ENCOUNTER — Ambulatory Visit (INDEPENDENT_AMBULATORY_CARE_PROVIDER_SITE_OTHER): Payer: Medicare Other | Admitting: Family Medicine

## 2018-11-27 ENCOUNTER — Encounter: Payer: Self-pay | Admitting: Family Medicine

## 2018-11-27 ENCOUNTER — Other Ambulatory Visit: Payer: Self-pay

## 2018-11-27 ENCOUNTER — Other Ambulatory Visit (HOSPITAL_COMMUNITY)
Admission: RE | Admit: 2018-11-27 | Discharge: 2018-11-27 | Disposition: A | Discharge status: Home / Self Care | Payer: Medicare Other | Source: Ambulatory Visit | Attending: Family Medicine | Admitting: Family Medicine

## 2018-11-27 VITALS — BP 150/80 | HR 87 | Temp 97.5°F | Ht 67.0 in | Wt 219.6 lb

## 2018-11-27 DIAGNOSIS — N898 Other specified noninflammatory disorders of vagina: Secondary | ICD-10-CM

## 2018-11-27 DIAGNOSIS — I10 Essential (primary) hypertension: Secondary | ICD-10-CM | POA: Diagnosis not present

## 2018-11-27 DIAGNOSIS — H8110 Benign paroxysmal vertigo, unspecified ear: Secondary | ICD-10-CM | POA: Diagnosis not present

## 2018-11-27 DIAGNOSIS — F419 Anxiety disorder, unspecified: Secondary | ICD-10-CM

## 2018-11-27 DIAGNOSIS — F329 Major depressive disorder, single episode, unspecified: Secondary | ICD-10-CM

## 2018-11-27 DIAGNOSIS — N644 Mastodynia: Secondary | ICD-10-CM | POA: Diagnosis not present

## 2018-11-27 DIAGNOSIS — Z87891 Personal history of nicotine dependence: Secondary | ICD-10-CM

## 2018-11-27 MED ORDER — ESCITALOPRAM OXALATE 10 MG PO TABS: 10.0000 mg | ORAL_TABLET | Freq: Every day | ORAL | 1 refills | Status: DC

## 2018-11-27 NOTE — Patient Instructions (Addendum)
Nice to see you. We will contact you with the results of your swab. I refilled your Lexapro. We will contact you once we hear back from your cardiologist.

## 2018-11-28 DIAGNOSIS — N898 Other specified noninflammatory disorders of vagina: Secondary | ICD-10-CM | POA: Insufficient documentation

## 2018-11-28 DIAGNOSIS — I10 Essential (primary) hypertension: Secondary | ICD-10-CM | POA: Insufficient documentation

## 2018-11-28 NOTE — Assessment & Plan Note (Signed)
Symptoms are consistent with BPPV.  She will try Epley maneuvers at home.

## 2018-11-28 NOTE — Assessment & Plan Note (Signed)
Suspect BV though we will send off a swab and confirm prior to treatment.

## 2018-11-28 NOTE — Progress Notes (Signed)
[name], MD Phone: [phone]  Donna Rios is a 68 y.o. female who presents today for f/u.  Vertigo: Patient notes onset over the last several days.  Occurs with movement of her head in certain directions.  Lasts very briefly and resolves on its own with no intervention.  She has a history of BPPV and does have Epley maneuver instructions at home.  Smoking cessation: Patient notes she quit smoking previously.  Since she quit she has noted a metallic taste in her throat.  Notes no reflux symptoms.  No trouble swallowing.  No globus sensation.  No alcohol intake.  Does not occur very frequently.  Vaginal odor: Patient notes having a vaginal fishy odor over the last couple of days.  She notes no discharge.  She notes she has not been sexually active in several years.  Anxiety: She notes lots of stressors recently with her brother passing away and then her sister-in-law passing away as well.  Notes she is on Lexapro with good benefit.  She tries not to pay attention to the news.  No depression.  Some anxiety.  No SI.  Elevated BP: Patient notes her blood pressure has been running anywhere between 115 and 164/74-97.  No chest pain or shortness of breath.  She is status post TAVR.  Bruise: Patient reports a bruise over her left lateral ribs at the edge of her left breast.  She does not remember hitting this area though she has been dizzy from her vertigo and wonders if she could have bumped the area.  She had a recent diagnostic mammogram with no abnormalities  Social History   Tobacco Use  Smoking Status Former Smoker  . Packs/day: 0.50  . Years: 46.00  . Pack years: 23.00  . Types: Cigarettes  Smokeless Tobacco Never Used  Tobacco Comment   stopped 06/11/2018...smokes 1-10 per day     ROS see history of present illness  Objective  Physical Exam Vitals:   11/27/18 1456  BP: (!) 150/80  Pulse: 87  Temp: (!) 97.5 F (36.4 C)  SpO2: 98%   Laying blood pressure 180/90  pulse 76 Standing blood pressure 190/80 pulse 83 Standing blood pressure 160/100 pulse 96   BP Readings from Last 3 Encounters:  11/27/18 (!) 150/80  10/16/18 (!) 150/90  10/09/18 (!) 142/82   Wt Readings from Last 3 Encounters:  11/27/18 219 lb 9.6 oz (99.6 kg)  11/01/18 218 lb (98.9 kg)  10/16/18 216 lb (98 kg)    Physical Exam Constitutional:      General: She is not in acute distress.    Appearance: She is not diaphoretic.  Cardiovascular:     Rate and Rhythm: Normal rate and regular rhythm.     Heart sounds: Normal heart sounds.  Pulmonary:     Effort: Pulmonary effort is normal.     Breath sounds: Normal breath sounds.  Genitourinary:    Comments: Chaperone used, bilateral breast with no skin changes, nipple inversion, masses, or tenderness, no axillary masses bilaterally, there is a bruise in the mid axillary area just at the edge of her breast tissue with no underlying palpable abnormalities, normal labia, normal vaginal mucosa, cervix is surgically absent, mild creamy discharge noted, no palpable abnormalities or pain on bimanual exam Skin:    General: Skin is warm and dry.  Neurological:     Mental Status: She is alert.      Assessment/Plan: Please see individual problem list.  Hypertension Blood pressures are consistent with  hypertension.  The patient is hesitant to go on anything.  I will send a message to the patient's cardiologist to get their input on a regimen.  BPPV (benign paroxysmal positional vertigo) Symptoms are consistent with BPPV.  She will try Epley maneuvers at home.  Breast pain, left Resolved.  She had benign imaging.  She will monitor for recurrence.  I suspect there is an injury leading to her bruise that is at the edge of her breast tissue.  She will monitor this.  Anxiety and depression Patient continues to have anxiety issues.  She will continue on Lexapro.  She will monitor.  Former smoker Advertising account executive on quitting.  Undetermined  cause regarding the metallic taste in her throat.  Could be reflux.  She will monitor and if it continues to be an issue or if it becomes worse she will let us know.  Vaginal discharge Suspect BV though we will send off a swab and confirm prior to treatment.   No orders of the defined types were placed in this encounter.   Meds ordered this encounter  Medications  . escitalopram (LEXAPRO) 10 MG tablet    Sig: Take 1 tablet (10 mg total) by mouth daily.    Dispense:  90 tablet    Refill:  1     [name], MD [place]

## 2018-11-28 NOTE — Assessment & Plan Note (Signed)
Resolved.  She had benign imaging.  She will monitor for recurrence.  I suspect there is an injury leading to her bruise that is at the edge of her breast tissue.  She will monitor this.

## 2018-11-28 NOTE — Assessment & Plan Note (Signed)
Patient continues to have anxiety issues.  She will continue on Lexapro.  She will monitor.

## 2018-11-28 NOTE — Assessment & Plan Note (Signed)
Congratulated on quitting.  Undetermined cause regarding the metallic taste in her throat.  Could be reflux.  She will monitor and if it continues to be an issue or if it becomes worse she will let us know.

## 2018-11-28 NOTE — Assessment & Plan Note (Signed)
Blood pressures are consistent with hypertension.  The patient is hesitant to go on anything.  I will send a message to the patient's cardiologist to get their input on a regimen.

## 2018-11-29 ENCOUNTER — Other Ambulatory Visit: Payer: Self-pay | Admitting: Family Medicine

## 2018-11-29 LAB — CERVICOVAGINAL ANCILLARY ONLY
Bacterial Vaginitis (gardnerella): NEGATIVE
Candida Glabrata: NEGATIVE
Candida Vaginitis: POSITIVE — AB
Comment: NEGATIVE
Comment: NEGATIVE
Comment: NEGATIVE

## 2018-11-29 MED ORDER — FLUCONAZOLE 150 MG PO TABS: 150.0000 mg | ORAL_TABLET | ORAL | 0 refills | Status: DC

## 2018-12-02 NOTE — Progress Notes (Signed)
Can we call Ms. Donna Rios Suggest for her high blood pressure she start a low-dose medication losartan 25 mg daily This is the lowest dose, may not be enough but we can slowly increase if needed We do not 1 high blood pressure to put extra stress on her heart and valve

## 2018-12-06 ENCOUNTER — Telehealth: Payer: Self-pay | Admitting: *Deleted

## 2018-12-06 MED ORDER — LOSARTAN POTASSIUM 25 MG PO TABS: 25.0000 mg | ORAL_TABLET | Freq: Every day | ORAL | 3 refills | Status: DC

## 2018-12-06 NOTE — Telephone Encounter (Signed)
-----   Message from Minna Merritts, MD sent at 12/02/2018  5:04 PM EST -----   ----- Message ----- From: [name], MD Sent: 11/28/2018  10:59 AM EST To: Minna Merritts, MD  Hi Dr Rockey Situ, I saw Donna Rios for follow-up yesterday. Her BP has been running elevated at home in the 114-164/74-97 range. Seems to be >140/90 about 50% of the time. I discussed starting her on medication for this though she was resistant to the idea. I wanted to get your input on this to see if it will help convince the patient to get on medication. Given her recent TAVR is there a particular class of medication you would recommend? Thanks for your help.Randall Hiss

## 2018-12-06 NOTE — Telephone Encounter (Signed)
Spoke with patient and she reports that she has been under extreme stress lately with some deaths in her family and others who are not doing well. Numbers she provided were at different times of the day and she feels that when she is calm it is much lower. She read out the following: 128/76 last night  145/82 prior to that November 3rd 165/103 158/53 4th 10:30 pm 148/80 167/87 Reviewed Dr. Donivan Scull recommendations to start Losartan 25 mg once daily and she inquired about side effects and dizziness since this has been an issue for her. Discussed that she should review information on medication and she should monitor herself while taking this new medication. Reviewed importance of controlled blood pressure and risks associated with elevated numbers. She verbalized understanding of our conversation, agreement with plan, and had no further questions at this time.

## 2018-12-19 DIAGNOSIS — H524 Presbyopia: Secondary | ICD-10-CM | POA: Diagnosis not present

## 2018-12-20 ENCOUNTER — Telehealth: Payer: Self-pay | Admitting: Family Medicine

## 2018-12-20 NOTE — Telephone Encounter (Signed)
I called and scheduled the patient with the provider on Monday for a increase in dose of lexapro.  Nina,cma

## 2018-12-20 NOTE — Telephone Encounter (Signed)
Patient called and stated that her yeast infection is back and wants to know if Dr. [name] can call her something in and she also would like to know if he can increase her escitalopram (LEXAPRO) 10 MG tablet due to her anxiety is up. Please call patient back, thanks.

## 2018-12-25 ENCOUNTER — Other Ambulatory Visit: Payer: Self-pay

## 2018-12-25 ENCOUNTER — Encounter: Payer: Self-pay | Admitting: Family Medicine

## 2018-12-25 ENCOUNTER — Ambulatory Visit (INDEPENDENT_AMBULATORY_CARE_PROVIDER_SITE_OTHER): Payer: Medicare Other | Admitting: Family Medicine

## 2018-12-25 DIAGNOSIS — N898 Other specified noninflammatory disorders of vagina: Secondary | ICD-10-CM | POA: Diagnosis not present

## 2018-12-25 DIAGNOSIS — F419 Anxiety disorder, unspecified: Secondary | ICD-10-CM

## 2018-12-25 MED ORDER — ESCITALOPRAM OXALATE 20 MG PO TABS: 20.0000 mg | ORAL_TABLET | Freq: Every day | ORAL | 1 refills | Status: DC

## 2018-12-25 MED ORDER — FLUCONAZOLE 150 MG PO TABS: 150.0000 mg | ORAL_TABLET | ORAL | 0 refills | Status: DC

## 2018-12-25 NOTE — Progress Notes (Signed)
Virtual Visit via video Note  This visit type was conducted due to national recommendations for restrictions regarding the COVID-19 pandemic (e.g. social distancing).  This format is felt to be most appropriate for this patient at this time.  All issues noted in this document were discussed and addressed.  No physical exam was performed (except for noted visual exam findings with Video Visits).   I connected with Donna Rios today at  3:15 PM EST by a video enabled telemedicine application and verified that I am speaking with the correct person using two identifiers. Location patient: home Location provider: work Persons participating in the virtual visit: patient, provider  I discussed the limitations, risks, security and privacy concerns of performing an evaluation and management service by telephone and the availability of in person appointments. I also discussed with the patient that there may be a patient responsible charge related to this service. The patient expressed understanding and agreed to proceed.  Reason for visit: follow-up  HPI: Vaginal discharge: Patient notes onset of this in the last several days.  She notes there is an odor.  She notes the symptoms are similar to when she had a yeast infection at the time of her last visit.  She notes some itching.  She did note some discomfort near her left hip that is not tender to palpation.  Normal bowel movements.  No blood in her stool.  No dysuria.  No diarrhea, vomiting, or nausea.  She is status post hysterectomy and BSO.  Anxiety: Patient notes this is terrible and has gotten worse.  Her brother passed away.  She had some issues with her siblings coming to her house and she tensed up tight with that.  She was unable to cry until about 3 days ago.  Her husband has been kind of snappy as he is caring for his mother.  She notes walking does help.  No depression.   ROS: See pertinent positives and negatives per HPI.  Past Medical  History:  Diagnosis Date  . Allergic rhinitis   . Arthritis   . Fatty liver   . Hypertension   . Phlebitis   . S/P TAVR (transcatheter aortic valve replacement)   . Severe aortic stenosis   . Stress incontinence   . Tobacco abuse     Past Surgical History:  Procedure Laterality Date  . BREAST BIOPSY Left    ducts removed  . Cataract surgery    . COLONOSCOPY WITH PROPOFOL N/A 09/29/2017   Procedure: COLONOSCOPY WITH PROPOFOL;  Surgeon: Lin Landsman, MD;  Location: [facility] ENDOSCOPY;  Service: Gastroenterology;  Laterality: N/A;  . EYE SURGERY    . FOOT SURGERY     7  . INTRAOPERATIVE TRANSTHORACIC ECHOCARDIOGRAM N/A 07/11/2018   Procedure: Intraoperative Transthoracic Echocardiogram;  Surgeon: Sherren Mocha, MD;  Location: [place];  Service: Open Heart Surgery;  Laterality: N/A;  . KNEE ARTHROSCOPY Right   . LAPAROSCOPIC HYSTERECTOMY    . RIGHT HEART CATH AND CORONARY ANGIOGRAPHY N/A 06/15/2018   Procedure: RIGHT HEART CATH AND CORONARY ANGIOGRAPHY;  Surgeon: Sherren Mocha, MD;  Location: [facility];  Service: Cardiovascular;  Laterality: N/A;  . TRANSCATHETER AORTIC VALVE REPLACEMENT, TRANSFEMORAL  07/11/2018  . TRANSCATHETER AORTIC VALVE REPLACEMENT, TRANSFEMORAL N/A 07/11/2018   Procedure: TRANSCATHETER AORTIC VALVE REPLACEMENT, TRANSFEMORAL;  Surgeon: Sherren Mocha, MD;  Location: [facility];  Service: Open Heart Surgery;  Laterality: N/A;  . VARICOSE VEIN SURGERY      Family History  Problem Relation  Age of Onset  . Alcoholism Other   . Arthritis Other   . Lung cancer Other   . Heart disease Other   . Stroke Other   . Hypertension Other   . Diabetes Other   . Heart failure Mother   . Hypertension Mother   . Diabetes Mother   . Heart failure Father   . Hypotension Father   . Breast cancer Neg Hx     SOCIAL HX: Former smoker   Current Outpatient Medications:  .  acetaminophen (TYLENOL) 650 MG CR tablet, Take 650 mg by mouth every 8 (eight) hours as  needed for pain. , Disp: , Rfl:  .  amoxicillin (AMOXIL) 500 MG tablet, Take 4 tablets (2,000 mg total) by mouth as directed. 1 hour prior to dental work, including cleanings, Disp: 12 tablet, Rfl: 12 .  apixaban (ELIQUIS) 5 MG TABS tablet, Take 1 tablet (5 mg total) by mouth 2 (two) times daily., Disp: 60 tablet, Rfl: 11 .  APPLE CIDER VINEGAR PO, Take 1 tablet by mouth daily as needed (with fatty foods). , Disp: , Rfl:  .  aspirin EC 81 MG EC tablet, Take 1 tablet (81 mg total) by mouth daily., Disp:  , Rfl:  .  b complex vitamins tablet, Take 1 tablet by mouth every other day., Disp: , Rfl:  .  Calcium Carbonate-Vit D-Min (CALCIUM 1200 PO), Take 1,200 mg by mouth daily. , Disp: , Rfl:  .  cetirizine (ZYRTEC) 10 MG tablet, Take 10 mg by mouth as needed. , Disp: , Rfl:  .  escitalopram (LEXAPRO) 20 MG tablet, Take 1 tablet (20 mg total) by mouth daily., Disp: 90 tablet, Rfl: 1 .  fluconazole (DIFLUCAN) 150 MG tablet, Take 1 tablet (150 mg total) by mouth every 3 (three) days., Disp: 3 tablet, Rfl: 0 .  fluticasone (FLONASE) 50 MCG/ACT nasal spray, Place 1 spray into both nostrils daily as needed for allergies. , Disp: , Rfl:  .  ketoconazole (NIZORAL) 2 % cream, Apply 1 application topically daily., Disp: 15 g, Rfl: 0 .  losartan (COZAAR) 25 MG tablet, Take 1 tablet (25 mg total) by mouth daily., Disp: 90 tablet, Rfl: 3 .  Menthol, Topical Analgesic, (ICY HOT EX), Apply 1 application topically 4 (four) times daily as needed (muscle pain). , Disp: , Rfl:  .  Multiple Vitamin (MULTIVITAMIN WITH MINERALS) TABS tablet, Take 1 tablet by mouth daily. Women's Complete Multivitamin 50+, Disp: , Rfl:  .  Probiotic Product (PROBIOTIC ADVANCED PO), Take 1 tablet by mouth every other day. , Disp: , Rfl:  .  Propylene Glycol (SYSTANE COMPLETE OP), Place 1 drop into both eyes 3 (three) times daily as needed (dry/irritated eyes.). , Disp: , Rfl:  .  rosuvastatin (CRESTOR) 40 MG tablet, TAKE 1 TABLET BY MOUTH  EVERY DAY, Disp: 90 tablet, Rfl: 2 .  sodium chloride (OCEAN) 0.65 % SOLN nasal spray, Place 1 spray into both nostrils 4 (four) times daily as needed for congestion. , Disp: , Rfl:   EXAM:  VITALS per patient if applicable: None  GENERAL: alert, oriented, appears well and in no acute distress  HEENT: atraumatic, conjunttiva clear, no obvious abnormalities on inspection of external nose and ears  NECK: normal movements of the head and neck  LUNGS: on inspection no signs of respiratory distress, breathing rate appears normal, no obvious gross SOB, gasping or wheezing  CV: no obvious cyanosis  MS: moves all visible extremities without noticeable abnormality  PSYCH/NEURO: pleasant  and cooperative, no obvious depression or anxiety, speech and thought processing grossly intact  ASSESSMENT AND PLAN:  Discussed the following assessment and plan:  Vaginal discharge Suspect vaginal yeast infection given similarities to her prior issues.  We will treat with Diflucan for 3 doses.  If she continues to have issues following that would consider GYN evaluation.  If the discomfort near her left hip does not improve with Diflucan she will let us know.  If that area worsen she will be evaluated sooner.  Anxiety This has worsened with the death of her brother.  I offered support.  Discussed that we could have her complete grief counseling or therapy though she defers that given lack of time.  She will let us know if she changes her mind regarding therapy.  We will increase her Lexapro.  We will see her back in about 8 weeks for recheck.     I discussed the assessment and treatment plan with the patient. The patient was provided an opportunity to ask questions and all were answered. The patient agreed with the plan and demonstrated an understanding of the instructions.   The patient was advised to call back or seek an in-person evaluation if the symptoms worsen or if the condition fails to improve as  anticipated.  [name], MD

## 2018-12-25 NOTE — Assessment & Plan Note (Signed)
Suspect vaginal yeast infection given similarities to her prior issues.  We will treat with Diflucan for 3 doses.  If she continues to have issues following that would consider GYN evaluation.  If the discomfort near her left hip does not improve with Diflucan she will let us know.  If that area worsen she will be evaluated sooner.

## 2018-12-25 NOTE — Assessment & Plan Note (Signed)
This has worsened with the death of her brother.  I offered support.  Discussed that we could have her complete grief counseling or therapy though she defers that given lack of time.  She will let us know if she changes her mind regarding therapy.  We will increase her Lexapro.  We will see her back in about 8 weeks for recheck.

## 2019-01-11 ENCOUNTER — Other Ambulatory Visit: Payer: Self-pay | Admitting: Family Medicine

## 2019-01-16 ENCOUNTER — Ambulatory Visit: Payer: Medicare Other | Admitting: Family Medicine

## 2019-01-29 ENCOUNTER — Ambulatory Visit (INDEPENDENT_AMBULATORY_CARE_PROVIDER_SITE_OTHER): Payer: Medicare Other | Admitting: Family Medicine

## 2019-01-29 ENCOUNTER — Other Ambulatory Visit: Payer: Self-pay

## 2019-01-29 ENCOUNTER — Telehealth: Payer: Self-pay | Admitting: Family Medicine

## 2019-01-29 DIAGNOSIS — I10 Essential (primary) hypertension: Secondary | ICD-10-CM

## 2019-01-29 DIAGNOSIS — F419 Anxiety disorder, unspecified: Secondary | ICD-10-CM

## 2019-01-29 DIAGNOSIS — N898 Other specified noninflammatory disorders of vagina: Secondary | ICD-10-CM

## 2019-01-29 NOTE — Assessment & Plan Note (Signed)
Improved control with losartan.  She will continue to monitor.

## 2019-01-29 NOTE — Progress Notes (Signed)
Virtual Visit via telephone Note  This visit type was conducted due to national recommendations for restrictions regarding the COVID-19 pandemic (e.g. social distancing).  This format is felt to be most appropriate for this patient at this time.  All issues noted in this document were discussed and addressed.  No physical exam was performed (except for noted visual exam findings with Video Visits).   I connected with Donna Rios today at 11:30 AM EST by telephone and verified that I am speaking with the correct person using two identifiers. Location patient: home Location provider: work Persons participating in the virtual visit: patient, provider  I discussed the limitations, risks, security and privacy concerns of performing an evaluation and management service by telephone and the availability of in person appointments. I also discussed with the patient that there may be a patient responsible charge related to this service. The patient expressed understanding and agreed to proceed.  Interactive audio and video telecommunications were attempted between this provider and patient, however failed, due to patient having technical difficulties OR patient did not have access to video capability.  We continued and completed visit with audio only.   Reason for visit: follow-up  HPI: HYPERTENSION  Disease Monitoring  Home BP Monitoring 122-136/78 Chest pain- no    Dyspnea- no Medications  Compliance-  Taking losartan.   Edema- related to arthritis in left foot/ankle  Anxiety: notes this is doing ok at this time. She is having some possible side effects from the lexapro. Drowsy during the day and staying awake at night.  Occasional headache.  Taking lexapro in the morning. Mother in law passed away in 27-Jan-2023. 2 other family members passed recently as well. Some grief. No depression.  Vaginal discharge: Notes this resolved with Diflucan.  The discomfort resolved as well.  She is having no  current symptoms.     ROS: See pertinent positives and negatives per HPI.  Past Medical History:  Diagnosis Date  . Allergic rhinitis   . Arthritis   . Fatty liver   . Hypertension   . Phlebitis   . S/P TAVR (transcatheter aortic valve replacement)   . Severe aortic stenosis   . Stress incontinence   . Tobacco abuse     Past Surgical History:  Procedure Laterality Date  . BREAST BIOPSY Left    ducts removed  . Cataract surgery    . COLONOSCOPY WITH PROPOFOL N/A 09/29/2017   Procedure: COLONOSCOPY WITH PROPOFOL;  Surgeon: Lin Landsman, MD;  Location: [facility] ENDOSCOPY;  Service: Gastroenterology;  Laterality: N/A;  . EYE SURGERY    . FOOT SURGERY     7  . INTRAOPERATIVE TRANSTHORACIC ECHOCARDIOGRAM N/A 07/11/2018   Procedure: Intraoperative Transthoracic Echocardiogram;  Surgeon: Sherren Mocha, MD;  Location: [place];  Service: Open Heart Surgery;  Laterality: N/A;  . KNEE ARTHROSCOPY Right   . LAPAROSCOPIC HYSTERECTOMY    . RIGHT HEART CATH AND CORONARY ANGIOGRAPHY N/A 06/15/2018   Procedure: RIGHT HEART CATH AND CORONARY ANGIOGRAPHY;  Surgeon: Sherren Mocha, MD;  Location: [place] CV LAB;  Service: Cardiovascular;  Laterality: N/A;  . TRANSCATHETER AORTIC VALVE REPLACEMENT, TRANSFEMORAL  07/11/2018  . TRANSCATHETER AORTIC VALVE REPLACEMENT, TRANSFEMORAL N/A 07/11/2018   Procedure: TRANSCATHETER AORTIC VALVE REPLACEMENT, TRANSFEMORAL;  Surgeon: Sherren Mocha, MD;  Location: [place];  Service: Open Heart Surgery;  Laterality: N/A;  . VARICOSE VEIN SURGERY      Family History  Problem Relation Age of Onset  . Alcoholism Other   . Arthritis  Other   . Lung cancer Other   . Heart disease Other   . Stroke Other   . Hypertension Other   . Diabetes Other   . Heart failure Mother   . Hypertension Mother   . Diabetes Mother   . Heart failure Father   . Hypotension Father   . Breast cancer Neg Hx     SOCIAL HX: Former smoker   Current Outpatient Medications:    .  acetaminophen (TYLENOL) 650 MG CR tablet, Take 650 mg by mouth every 8 (eight) hours as needed for pain. , Disp: , Rfl:  .  amoxicillin (AMOXIL) 500 MG tablet, Take 4 tablets (2,000 mg total) by mouth as directed. 1 hour prior to dental work, including cleanings, Disp: 12 tablet, Rfl: 12 .  apixaban (ELIQUIS) 5 MG TABS tablet, Take 1 tablet (5 mg total) by mouth 2 (two) times daily., Disp: 60 tablet, Rfl: 11 .  APPLE CIDER VINEGAR PO, Take 1 tablet by mouth daily as needed (with fatty foods). , Disp: , Rfl:  .  aspirin EC 81 MG EC tablet, Take 1 tablet (81 mg total) by mouth daily., Disp:  , Rfl:  .  b complex vitamins tablet, Take 1 tablet by mouth every other day., Disp: , Rfl:  .  Calcium Carbonate-Vit D-Min (CALCIUM 1200 PO), Take 1,200 mg by mouth daily. , Disp: , Rfl:  .  cetirizine (ZYRTEC) 10 MG tablet, Take 10 mg by mouth as needed. , Disp: , Rfl:  .  escitalopram (LEXAPRO) 20 MG tablet, Take 1 tablet (20 mg total) by mouth daily., Disp: 90 tablet, Rfl: 1 .  FLUAD QUADRIVALENT 0.5 ML injection, , Disp: , Rfl:  .  fluticasone (FLONASE) 50 MCG/ACT nasal spray, Place 1 spray into both nostrils daily as needed for allergies. , Disp: , Rfl:  .  ketoconazole (NIZORAL) 2 % cream, Apply 1 application topically daily., Disp: 15 g, Rfl: 0 .  losartan (COZAAR) 25 MG tablet, Take 1 tablet (25 mg total) by mouth daily., Disp: 90 tablet, Rfl: 3 .  Menthol, Topical Analgesic, (ICY HOT EX), Apply 1 application topically 4 (four) times daily as needed (muscle pain). , Disp: , Rfl:  .  Multiple Vitamin (MULTIVITAMIN WITH MINERALS) TABS tablet, Take 1 tablet by mouth daily. Women's Complete Multivitamin 50+, Disp: , Rfl:  .  Probiotic Product (PROBIOTIC ADVANCED PO), Take 1 tablet by mouth every other day. , Disp: , Rfl:  .  Propylene Glycol (SYSTANE COMPLETE OP), Place 1 drop into both eyes 3 (three) times daily as needed (dry/irritated eyes.). , Disp: , Rfl:  .  rosuvastatin (CRESTOR) 40 MG tablet,  TAKE 1 TABLET BY MOUTH EVERY DAY, Disp: 90 tablet, Rfl: 2 .  sodium chloride (OCEAN) 0.65 % SOLN nasal spray, Place 1 spray into both nostrils 4 (four) times daily as needed for congestion. , Disp: , Rfl:   EXAM: This was a telehealth telephone visit and thus no physical exam was completed.  ASSESSMENT AND PLAN:  Discussed the following assessment and plan:  Hypertension Improved control with losartan.  She will continue to monitor.  Vaginal discharge Resolved with Diflucan.  She will monitor for recurrence.  Anxiety She will shift her Lexapro to nighttime.  If her symptoms are not improving with this we can decrease the dose to 10 mg once daily.  She will contact us if her symptoms are not improving with the change in dose timing.   No orders of the defined types  were placed in this encounter.   No orders of the defined types were placed in this encounter.    I discussed the assessment and treatment plan with the patient. The patient was provided an opportunity to ask questions and all were answered. The patient agreed with the plan and demonstrated an understanding of the instructions.   The patient was advised to call back or seek an in-person evaluation if the symptoms worsen or if the condition fails to improve as anticipated.  I provided 23 minutes of non-face-to-face time during this encounter.   [name], MD

## 2019-01-29 NOTE — Assessment & Plan Note (Signed)
She will shift her Lexapro to nighttime.  If her symptoms are not improving with this we can decrease the dose to 10 mg once daily.  She will contact us if her symptoms are not improving with the change in dose timing.

## 2019-01-29 NOTE — Assessment & Plan Note (Signed)
Resolved with Diflucan.  She will monitor for recurrence.

## 2019-01-29 NOTE — Telephone Encounter (Signed)
Please call the patient.  I noted after her visit that she needed some blood work since starting losartan to check her kidney function and electrolytes.  I placed orders.  Please get her scheduled.

## 2019-01-30 ENCOUNTER — Telehealth: Payer: Self-pay | Admitting: *Deleted

## 2019-01-30 DIAGNOSIS — I10 Essential (primary) hypertension: Secondary | ICD-10-CM

## 2019-01-30 NOTE — Telephone Encounter (Signed)
Ordered

## 2019-01-30 NOTE — Telephone Encounter (Signed)
Please place future orders for lab appt.   Pt was scheduled today for lab appt for 01/31/19.

## 2019-01-30 NOTE — Telephone Encounter (Signed)
I called and scheduled a lab visit for tomorrow with the patient.  Carmen Tolliver,cma

## 2019-01-31 ENCOUNTER — Other Ambulatory Visit: Payer: Self-pay

## 2019-01-31 ENCOUNTER — Other Ambulatory Visit (INDEPENDENT_AMBULATORY_CARE_PROVIDER_SITE_OTHER): Payer: Medicare Other

## 2019-01-31 DIAGNOSIS — I10 Essential (primary) hypertension: Secondary | ICD-10-CM | POA: Diagnosis not present

## 2019-01-31 LAB — BASIC METABOLIC PANEL
BUN: 13 mg/dL (ref 6–23)
CO2: 29 mEq/L (ref 19–32)
Calcium: 9.6 mg/dL (ref 8.4–10.5)
Chloride: 102 mEq/L (ref 96–112)
Creatinine, Ser: 0.84 mg/dL (ref 0.40–1.20)
GFR: 67.34 mL/min (ref >60.00)
Glucose, Bld: 114 mg/dL — ABNORMAL HIGH (ref 70–99)
Potassium: 4.2 mEq/L (ref 3.5–5.1)
Sodium: 137 mEq/L (ref 135–145)

## 2019-02-03 ENCOUNTER — Other Ambulatory Visit: Payer: Self-pay | Admitting: Family Medicine

## 2019-02-03 DIAGNOSIS — R7989 Other specified abnormal findings of blood chemistry: Secondary | ICD-10-CM

## 2019-02-19 ENCOUNTER — Other Ambulatory Visit: Payer: Self-pay

## 2019-02-21 ENCOUNTER — Other Ambulatory Visit (INDEPENDENT_AMBULATORY_CARE_PROVIDER_SITE_OTHER): Payer: Medicare Other

## 2019-02-21 ENCOUNTER — Other Ambulatory Visit: Payer: Self-pay

## 2019-02-21 DIAGNOSIS — R7989 Other specified abnormal findings of blood chemistry: Secondary | ICD-10-CM

## 2019-02-21 LAB — BASIC METABOLIC PANEL
BUN: 12 mg/dL (ref 6–23)
CO2: 30 mEq/L (ref 19–32)
Calcium: 9.5 mg/dL (ref 8.4–10.5)
Chloride: 100 mEq/L (ref 96–112)
Creatinine, Ser: 0.79 mg/dL (ref 0.40–1.20)
GFR: 72.27 mL/min (ref >60.00)
Glucose, Bld: 100 mg/dL — ABNORMAL HIGH (ref 70–99)
Potassium: 4 mEq/L (ref 3.5–5.1)
Sodium: 136 mEq/L (ref 135–145)

## 2019-03-09 ENCOUNTER — Telehealth: Payer: Self-pay | Admitting: Family Medicine

## 2019-03-09 ENCOUNTER — Telehealth: Payer: Self-pay | Admitting: Cardiovascular Disease

## 2019-03-09 NOTE — Telephone Encounter (Signed)
Pt needs a letter stating that she can have her Covid vaccine because she is on ELIQUIS Pt needs this by 03/13/19 I can do this letter if you like I have not had anyone to ask about a letter.  Cheryll Keisler,cma

## 2019-03-09 NOTE — Telephone Encounter (Signed)
Can you ask her why she needs this letter?  Who is requesting this?  Being on a blood thinner is not a contraindication to getting the Covid vaccine.  There is risk of bleeding from the injection or hematoma from the injection though it does not preclude people from getting the vaccine.  If she needs a letter I am happy to complete it.

## 2019-03-09 NOTE — Telephone Encounter (Signed)
Letter sent to patient's MyChart.  She should remain on the Eliquis given her history of atrial fibrillation.  If she came off of this she would be at increased risk for stroke.

## 2019-03-09 NOTE — Telephone Encounter (Signed)
Patient states the [employer]. Wanted this letter they said it is part of their protocol for all patient on blood thinners.  I informed her I would call her when its ready she wants to pick it up.  Als patient stated does she still need to be on eliquis because when she was tested  twice for covid back in June she had nosebleeds each time and both tests were done In her left nostril and that is the one that was bleeding and she is having h/a.  Please advise.  Emmett Arntz,cma

## 2019-03-09 NOTE — Telephone Encounter (Signed)
Received a letter from [facility] requesting permission for Moderna Covid Vaccination Placed in nurse box, please review and complete

## 2019-03-09 NOTE — Telephone Encounter (Signed)
Pt needs a letter stating that she can have her Covid vaccine because she is on ELIQUIS Pt needs this by 03/13/19

## 2019-03-12 NOTE — Telephone Encounter (Signed)
I called the patient and informed her that her letter was ready on my chart, she also wanted you to advise her on the nosebleeds and h/a she is getting from the eliquis.  Nica Friske,cma

## 2019-03-13 NOTE — Telephone Encounter (Signed)
I called and spoke with the patient and informed her that h/a is not a side effect for eliquis but she needed a visit, the patient stated she has a visit on 03/27/19 and she will discuss the nosebleeds and and the h/a with the provider then.  Armistead Sult,cma

## 2019-03-13 NOTE — Telephone Encounter (Signed)
I would suggest we complete a visit if possible to discuss these concerns. If she is having prolonged nose bleeds >10 minutes or profuse bleeding she should be seen in person at an urgent care or the ED. I do not see headaches listed as a side effect from eliquis so we really need to complete a visit to determine a cause.

## 2019-03-14 NOTE — Telephone Encounter (Signed)
Form has been signed and will fax to number listed.

## 2019-03-27 ENCOUNTER — Ambulatory Visit (INDEPENDENT_AMBULATORY_CARE_PROVIDER_SITE_OTHER): Payer: Medicare Other | Admitting: Family Medicine

## 2019-03-27 ENCOUNTER — Other Ambulatory Visit: Payer: Self-pay

## 2019-03-27 ENCOUNTER — Encounter: Payer: Self-pay | Admitting: Family Medicine

## 2019-03-27 VITALS — BP 140/80 | HR 81 | Temp 97.4°F | Ht 67.0 in | Wt 217.4 lb

## 2019-03-27 DIAGNOSIS — I10 Essential (primary) hypertension: Secondary | ICD-10-CM

## 2019-03-27 DIAGNOSIS — L299 Pruritus, unspecified: Secondary | ICD-10-CM | POA: Diagnosis not present

## 2019-03-27 DIAGNOSIS — F419 Anxiety disorder, unspecified: Secondary | ICD-10-CM

## 2019-03-27 DIAGNOSIS — R0789 Other chest pain: Secondary | ICD-10-CM | POA: Diagnosis not present

## 2019-03-27 LAB — HEPATIC FUNCTION PANEL
ALT: 18 U/L (ref 0–35)
AST: 19 U/L (ref 0–37)
Albumin: 4.1 g/dL (ref 3.5–5.2)
Alkaline Phosphatase: 78 U/L (ref 39–117)
Bilirubin, Direct: 0.1 mg/dL (ref 0.0–0.3)
Total Bilirubin: 0.4 mg/dL (ref 0.2–1.2)
Total Protein: 7 g/dL (ref 6.0–8.3)

## 2019-03-27 MED ORDER — ESCITALOPRAM OXALATE 10 MG PO TABS: 10.0000 mg | ORAL_TABLET | Freq: Every day | ORAL | 0 refills | Status: DC

## 2019-03-27 NOTE — Patient Instructions (Signed)
Nice to see you. We are going to work on changing your Lexapro to an alternative medicine.  I will contact you when I hear back from the pharmacist. We will get some lab work for the itching.

## 2019-03-27 NOTE — Assessment & Plan Note (Signed)
Suspect this is related to anxiety.  This is not consistent with a cardiac cause.  EKG is unchanged from prior.  We will treat anxiety as outlined below.

## 2019-03-27 NOTE — Progress Notes (Signed)
[name], MD Phone: [phone]  ALIJANDRA SPIVA is a 69 y.o. female who presents today for f/u.  HYPERTENSION  Disease Monitoring  Home BP Monitoring <134/75 Chest pain- yes, see below    Dyspnea- no Medications  Compliance-  Taking losartan.   Atypical chest pain: Patient notes for some time now she has had an intermittent left-sided chest discomfort that typically occurs when she is sitting in a chair.  It lasts briefly and does not occur very often.  Described as a cramp.  Feels like it is inside her chest.  She wonders if it is related to anxiety.  Does not seem consistent with prior indigestion.  There is no radiation, diaphoresis, or shortness of breath.  She had a cardiac catheterization in May 2020 with no evidence of CAD.  Anxiety: Continues to have issues with this.  She stopped her Lexapro last night as she has had issues with shaking and drowsiness with it.  She notes lots of anxiety particularly surrounding her husband.  He is grieving the loss of a family member and he is mean to her at times.  No SI.  She denies depression.  Itching: Patient notes she has been itching on her upper chest and mid back intermittently.  Has been going on for a few days.  She wonders if it is dry skin.  No rash.  She has tried some lotion on it.  Social History   Tobacco Use  Smoking Status Former Smoker  . Packs/day: 0.50  . Years: 46.00  . Pack years: 23.00  . Types: Cigarettes  Smokeless Tobacco Never Used  Tobacco Comment   stopped 06/11/2018...smokes 1-10 per day     ROS see history of present illness  Objective  Physical Exam Vitals:   03/27/19 0959  BP: 140/80  Pulse: 81  Temp: (!) 97.4 F (36.3 C)  SpO2: 97%    BP Readings from Last 3 Encounters:  03/27/19 140/80  11/27/18 (!) 150/80  10/16/18 (!) 150/90   Wt Readings from Last 3 Encounters:  03/27/19 217 lb 6.4 oz (98.6 kg)  12/25/18 212 lb 8 oz (96.4 kg)  11/27/18 219 lb 9.6 oz (99.6 kg)    Physical  Exam Constitutional:      General: She is not in acute distress.    Appearance: She is not diaphoretic.  Cardiovascular:     Rate and Rhythm: Normal rate and regular rhythm.     Heart sounds: Normal heart sounds.  Pulmonary:     Effort: Pulmonary effort is normal.     Breath sounds: Normal breath sounds.  Skin:    General: Skin is warm and dry.     Comments: No rash noted on upper chest  Neurological:     Mental Status: She is alert.    EKG: Normal sinus rhythm, rate 78, no apparent ST or T wave changes  Assessment/Plan: Please see individual problem list.  Atypical chest pain Suspect this is related to anxiety.  This is not consistent with a cardiac cause.  EKG is unchanged from prior.  We will treat anxiety as outlined below.  Hypertension Adequately controlled.  Continue current regimen.  Anxiety Discussed that she should not just stop her medication.  We will have her start back on Lexapro 10 mg once daily.  We will work to switch her to an alternative medication and I will confer with her clinical pharmacist on the best way to do this.  Itching Likely related to skin irritation or  allergens.  Discussed lotion and moisturizing soap.  We will check a hepatic function panel to evaluate her bilirubin.  Orders Placed This Encounter  Procedures  . Hepatic function panel  . EKG 12-Lead    Meds ordered this encounter  Medications  . escitalopram (LEXAPRO) 10 MG tablet    Sig: Take 1 tablet (10 mg total) by mouth daily.    Dispense:  30 tablet    Refill:  0    This visit occurred during the SARS-CoV-2 public health emergency.  Safety protocols were in place, including screening questions prior to the visit, additional usage of staff PPE, and extensive cleaning of exam room while observing appropriate contact time as indicated for disinfecting solutions.    [name], MD [place]

## 2019-03-27 NOTE — Assessment & Plan Note (Signed)
Adequately controlled.  Continue current regimen. 

## 2019-03-27 NOTE — Assessment & Plan Note (Signed)
Likely related to skin irritation or allergens.  Discussed lotion and moisturizing soap.  We will check a hepatic function panel to evaluate her bilirubin.

## 2019-03-27 NOTE — Assessment & Plan Note (Signed)
Discussed that she should not just stop her medication.  We will have her start back on Lexapro 10 mg once daily.  We will work to switch her to an alternative medication and I will confer with her clinical pharmacist on the best way to do this.

## 2019-04-01 ENCOUNTER — Telehealth: Payer: Self-pay | Admitting: Family Medicine

## 2019-04-01 NOTE — Telephone Encounter (Signed)
-----   Message from De Hollingshead, [facility] sent at 03/27/2019  4:50 PM EST ----- She had just stopped escitalopram 20 mg last night? So wasn't off for any point, really?  Yep, I think that escitalopram 10 mg while overlapping duloxetine 30 mg x 1 week, then stop escitalopram and increase duloxetine to 60 mg.   Moving forward, if any benefit from duloxetine but not full goal reduction in anxiety symptoms, maybe consider augmenting w/ buspirone.  Catie ----- Message ----- From: [name], MD Sent: 03/27/2019   1:23 PM EST To: De Hollingshead, [place],   This patient has been having issues with anxiety. We have tried zoloft (not beneficial) and lexapro (has felt drowsy). I would like to try cymbalta to see if maybe an SNRI would be helpful. I have had the patient decrease down to lexapro 10 mg daily. Should I continue that for 1 week and then start on the cymbalta at 30 mg daily for one week then increase to 60 mg daily? Thanks for your help.  Randall Hiss

## 2019-04-01 NOTE — Telephone Encounter (Signed)
Please call the patient and let her know that I will send in cymbalta for her to start on once you speak with her. She will take the lexapro 10 mg once daily for one week while starting on the cymbalta 30 mg once daily at the same time for one week. She will then stop the lexapro and then increase the cymbalta to 60 mg once daily. Thanks.

## 2019-04-02 NOTE — Telephone Encounter (Signed)
I called and spoke with the patient and informed her on how to taper off the Lexapro and how and when to start the Cymbalta, the patient wrote it down and read it back to me and she understood.  Sabian Kuba,cma

## 2019-04-03 ENCOUNTER — Telehealth: Payer: Self-pay

## 2019-04-04 MED ORDER — DULOXETINE HCL 30 MG PO CPEP: ORAL_CAPSULE | ORAL | 1 refills | Status: DC

## 2019-04-04 NOTE — Telephone Encounter (Signed)
Sent to pharmacy 

## 2019-04-04 NOTE — Telephone Encounter (Signed)
I called and informed the patient that the medication Cymbalta was sent to the pharmacy.  Amylynn Fano,cma

## 2019-04-18 ENCOUNTER — Encounter: Payer: Self-pay | Admitting: Family Medicine

## 2019-06-11 ENCOUNTER — Other Ambulatory Visit: Payer: Self-pay

## 2019-06-11 ENCOUNTER — Ambulatory Visit (INDEPENDENT_AMBULATORY_CARE_PROVIDER_SITE_OTHER): Payer: Medicare Other

## 2019-06-11 ENCOUNTER — Ambulatory Visit (INDEPENDENT_AMBULATORY_CARE_PROVIDER_SITE_OTHER): Payer: Medicare Other | Admitting: Family Medicine

## 2019-06-11 ENCOUNTER — Encounter: Payer: Self-pay | Admitting: Family Medicine

## 2019-06-11 VITALS — Ht 67.01 in | Wt 218.0 lb

## 2019-06-11 VITALS — BP 144/90 | HR 79 | Temp 97.4°F | Ht 67.01 in | Wt 218.4 lb

## 2019-06-11 DIAGNOSIS — F419 Anxiety disorder, unspecified: Secondary | ICD-10-CM

## 2019-06-11 DIAGNOSIS — W19XXXA Unspecified fall, initial encounter: Secondary | ICD-10-CM | POA: Insufficient documentation

## 2019-06-11 DIAGNOSIS — M25562 Pain in left knee: Secondary | ICD-10-CM

## 2019-06-11 DIAGNOSIS — S6990XA Unspecified injury of unspecified wrist, hand and finger(s), initial encounter: Secondary | ICD-10-CM

## 2019-06-11 DIAGNOSIS — M25561 Pain in right knee: Secondary | ICD-10-CM | POA: Diagnosis not present

## 2019-06-11 DIAGNOSIS — S6991XA Unspecified injury of right wrist, hand and finger(s), initial encounter: Secondary | ICD-10-CM | POA: Diagnosis not present

## 2019-06-11 DIAGNOSIS — K219 Gastro-esophageal reflux disease without esophagitis: Secondary | ICD-10-CM

## 2019-06-11 DIAGNOSIS — G8929 Other chronic pain: Secondary | ICD-10-CM

## 2019-06-11 DIAGNOSIS — I10 Essential (primary) hypertension: Secondary | ICD-10-CM

## 2019-06-11 DIAGNOSIS — Z0000 Encounter for general adult medical examination without abnormal findings: Secondary | ICD-10-CM | POA: Diagnosis not present

## 2019-06-11 DIAGNOSIS — M79644 Pain in right finger(s): Secondary | ICD-10-CM | POA: Diagnosis not present

## 2019-06-11 NOTE — Assessment & Plan Note (Signed)
Discussed falls precautions.  Encouraged her to continue to walk with a cane.

## 2019-06-11 NOTE — Assessment & Plan Note (Signed)
Only has occasional symptoms.  She will monitor.

## 2019-06-11 NOTE — Assessment & Plan Note (Signed)
X-ray today

## 2019-06-11 NOTE — Patient Instructions (Addendum)
  Donna Rios , Thank you for taking time to come for your Medicare Wellness Visit. I appreciate your ongoing commitment to your health goals. Please review the following plan we discussed and let me know if I can assist you in the future.   These are the goals we discussed: Goals      Patient Stated   . I should wear my glasses more (pt-stated)    . LIFESTYLE - DECREASE FALLS RISK (pt-stated)       This is a list of the screening recommended for you and due dates:  Health Maintenance  Topic Date Due  . Cologuard (Stool DNA test)  06/10/2020*  . Flu Shot  08/26/2019  . Mammogram  11/06/2020  . Tetanus Vaccine  05/07/2026  . DEXA scan (bone density measurement)  Completed  . COVID-19 Vaccine  Completed  .  Hepatitis C: One time screening is recommended by Center for Disease Control  (CDC) for  adults born from [birth year] through 1965.   Completed  . Pneumonia vaccines  Completed  *Topic was postponed. The date shown is not the original due date.

## 2019-06-11 NOTE — Assessment & Plan Note (Signed)
Worsened recently.  There was a trigger.  Also with mild depression.  Given possible drowsiness with the Cymbalta will confer with our clinical pharmacist on the next best medication to treat her anxiety and depression with.  She had a similar reaction to Lexapro.

## 2019-06-11 NOTE — Progress Notes (Signed)
Subjective:   Donna Rios is a 69 y.o. female who presents for Medicare Annual (Subsequent) preventive examination.  Review of Systems:  No ROS.  Medicare Wellness Virtual Visit.  Visual/audio telehealth visit, UTA vital signs.   Ht/Wt provided.  See social history for additional risk factors.   Cardiac Risk Factors include: advanced age (>52men, >28 women);hypertension     Objective:     Vitals: Ht 5' 7.01" (1.702 m)   Wt 218 lb (98.9 kg)   BMI 34.13 kg/m   Body mass index is 34.13 kg/m.  Advanced Directives 06/11/2019 07/11/2018 07/07/2018 06/21/2018 06/15/2018 06/09/2018 09/29/2017  Does Patient Have a Medical Advance Directive? No No No No No No No  Would patient like information on creating a medical advance directive? No - Patient declined No - Patient declined No - Patient declined No - Patient declined Yes (MAU/Ambulatory/Procedural Areas - Information given) Yes (MAU/Ambulatory/Procedural Areas - Information given) -    Tobacco Social History   Tobacco Use  Smoking Status Current Some Day Smoker  . Packs/day: 0.50  . Years: 46.00  . Pack years: 23.00  . Types: Cigarettes  Smokeless Tobacco Never Used  Tobacco Comment   stopped 06/11/2018...smokes 1-10 per day     Ready to quit: Not Answered Counseling given: Not Answered Comment: stopped 06/11/2018...smokes 1-10 per day   Clinical Intake:  Pre-visit preparation completed: Yes        Diabetes: No  How often do you need to have someone help you when you read instructions, pamphlets, or other written materials from your doctor or pharmacy?: 1 - Never  Interpreter Needed?: No     Past Medical History:  Diagnosis Date  . Allergic rhinitis   . Arthritis   . Fatty liver   . Hypertension   . Phlebitis   . S/P TAVR (transcatheter aortic valve replacement)   . Severe aortic stenosis   . Stress incontinence   . Tobacco abuse    Past Surgical History:  Procedure Laterality Date  . BREAST BIOPSY  Left    ducts removed  . Cataract surgery    . COLONOSCOPY WITH PROPOFOL N/A 09/29/2017   Procedure: COLONOSCOPY WITH PROPOFOL;  Surgeon: Lin Landsman, MD;  Location: [facility] ENDOSCOPY;  Service: Gastroenterology;  Laterality: N/A;  . EYE SURGERY    . FOOT SURGERY     7  . INTRAOPERATIVE TRANSTHORACIC ECHOCARDIOGRAM N/A 07/11/2018   Procedure: Intraoperative Transthoracic Echocardiogram;  Surgeon: Sherren Mocha, MD;  Location: [place];  Service: Open Heart Surgery;  Laterality: N/A;  . KNEE ARTHROSCOPY Right   . LAPAROSCOPIC HYSTERECTOMY    . RIGHT HEART CATH AND CORONARY ANGIOGRAPHY N/A 06/15/2018   Procedure: RIGHT HEART CATH AND CORONARY ANGIOGRAPHY;  Surgeon: Sherren Mocha, MD;  Location: [facility];  Service: Cardiovascular;  Laterality: N/A;  . TRANSCATHETER AORTIC VALVE REPLACEMENT, TRANSFEMORAL  07/11/2018  . TRANSCATHETER AORTIC VALVE REPLACEMENT, TRANSFEMORAL N/A 07/11/2018   Procedure: TRANSCATHETER AORTIC VALVE REPLACEMENT, TRANSFEMORAL;  Surgeon: Sherren Mocha, MD;  Location: [place];  Service: Open Heart Surgery;  Laterality: N/A;  . VARICOSE VEIN SURGERY     Family History  Problem Relation Age of Onset  . Alcoholism Other   . Arthritis Other   . Lung cancer Other   . Heart disease Other   . Stroke Other   . Hypertension Other   . Diabetes Other   . Heart failure Mother   . Hypertension Mother   . Diabetes Mother   .  Heart failure Father   . Hypotension Father   . Breast cancer Neg Hx    Social History   Socioeconomic History  . Marital status: Married    Spouse name: Not on file  . Number of children: Not on file  . Years of education: Not on file  . Highest education level: Not on file  Occupational History  . Not on file  Tobacco Use  . Smoking status: Current Some Day Smoker    Packs/day: 0.50    Years: 46.00    Pack years: 23.00    Types: Cigarettes  . Smokeless tobacco: Never Used  . Tobacco comment: stopped 06/11/2018...smokes 1-10  per day  Substance and Sexual Activity  . Alcohol use: Not Currently    Alcohol/week: 0.0 standard drinks  . Drug use: No  . Sexual activity: Not on file  Other Topics Concern  . Not on file  Social History Narrative  . Not on file   Social Determinants of Health   Financial Resource Strain:   . Difficulty of Paying Living Expenses:   Food Insecurity:   . Worried About Charity fundraiser in the Last Year:   . Arboriculturist in the Last Year:   Transportation Needs:   . Film/video editor (Medical):   Marland Kitchen Lack of Transportation (Non-Medical):   Physical Activity:   . Days of Exercise per Week:   . Minutes of Exercise per Session:   Stress:   . Feeling of Stress :   Social Connections:   . Frequency of Communication with Friends and Family:   . Frequency of Social Gatherings with Friends and Family:   . Attends Religious Services:   . Active Member of Clubs or Organizations:   . Attends Archivist Meetings:   Marland Kitchen Marital Status:     Outpatient Encounter Medications as of 06/11/2019  Medication Sig  . acetaminophen (TYLENOL) 650 MG CR tablet Take 650 mg by mouth every 8 (eight) hours as needed for pain.   Marland Kitchen apixaban (ELIQUIS) 5 MG TABS tablet Take 1 tablet (5 mg total) by mouth 2 (two) times daily.  . APPLE CIDER VINEGAR PO Take 1 tablet by mouth daily as needed (with fatty foods).   Marland Kitchen aspirin EC 81 MG EC tablet Take 1 tablet (81 mg total) by mouth daily.  Marland Kitchen b complex vitamins tablet Take 1 tablet by mouth every other day.  . cetirizine (ZYRTEC) 10 MG tablet Take 10 mg by mouth as needed.   . DULoxetine (CYMBALTA) 30 MG capsule Take 1 capsule (30 mg total) by mouth daily for 7 days, THEN 2 capsules (60 mg total) daily.  Marland Kitchen FLUAD QUADRIVALENT 0.5 ML injection   . fluticasone (FLONASE) 50 MCG/ACT nasal spray Place 1 spray into both nostrils daily as needed for allergies.   Marland Kitchen ketoconazole (NIZORAL) 2 % cream Apply 1 application topically daily.  Marland Kitchen losartan (COZAAR)  25 MG tablet Take 1 tablet (25 mg total) by mouth daily.  . Menthol, Topical Analgesic, (ICY HOT EX) Apply 1 application topically 4 (four) times daily as needed (muscle pain).   . Multiple Vitamin (MULTIVITAMIN WITH MINERALS) TABS tablet Take 1 tablet by mouth daily. Women's Complete Multivitamin 50+  . Probiotic Product (PROBIOTIC ADVANCED PO) Take 1 tablet by mouth every other day.   Marland Kitchen Propylene Glycol (SYSTANE COMPLETE OP) Place 1 drop into both eyes 3 (three) times daily as needed (dry/irritated eyes.).   Marland Kitchen rosuvastatin (CRESTOR) 40 MG tablet  TAKE 1 TABLET BY MOUTH EVERY DAY  . sodium chloride (OCEAN) 0.65 % SOLN nasal spray Place 1 spray into both nostrils 4 (four) times daily as needed for congestion.    No facility-administered encounter medications on file as of 06/11/2019.    Activities of Daily Living In your present state of health, do you have any difficulty performing the following activities: 06/11/2019 07/11/2018  Hearing? N -  Vision? N -  Difficulty concentrating or making decisions? Y -  Walking or climbing stairs? Y -  Comment Unsteady gait, cane in use -  Dressing or bathing? N -  Doing errands, shopping? N N  Preparing Food and eating ? N -  Using the Toilet? N -  In the past six months, have you accidently leaked urine? N -  Do you have problems with loss of bowel control? N -  Managing your Medications? N -  Managing your Finances? N -  Housekeeping or managing your Housekeeping? N -  Some recent data might be hidden    Patient Care Team: [name], MD as PCP - General (Family Medicine)    Assessment:   This is a routine wellness examination for Karis.  Nurse connected with patient 06/11/19 at 11:30 AM EDT by a telephone enabled telemedicine application and verified that I am speaking with the correct person using two identifiers. Patient stated full name and DOB. Patient gave permission to continue with virtual visit. Patient's location was at home  and Nurse's location was at [place] office.   Patient is alert and oriented x3. Patient likes to read and completes online puzzles for brain health.   Health Maintenance Due: -Colonoscopy completed 09/29/17 -Cologuard- deferred per patient See completed HM at the end of note.   Eye: Visual acuity not assessed. Virtual visit. Followed by their ophthalmologist.  Dental: UTD  Hearing: Demonstrates normal hearing during visit.  Safety:  Patient feels safe at home- yes Patient does have smoke detectors at home- yes Patient does wear sunscreen or protective clothing when in direct sunlight - yes Patient does wear seat belt when in a moving vehicle - yes Patient drives- yes Adequate lighting in walkways free from debris- yes Grab bars and handrails used as appropriate- yes Ambulates with an assistive device- yes; cane Cell phone on person when ambulating outside of the home-yes  Social: Alcohol intake - no   Smoking history- current Illicit drug use? none  Medication: Taking as directed and without issues.  Self managed - yes   Covid-19: Precautions and sickness symptoms discussed. Wears mask, social distancing, hand hygiene as appropriate.   Activities of Daily Living Patient denies needing assistance with: household chores, feeding themselves, getting from bed to chair, getting to the toilet, bathing/showering, dressing, managing money, or preparing meals.   Discussed the importance of a healthy diet, water intake and the benefits of aerobic exercise.  Physical activity- no routine. Encouraged to stay active.   Diet:  Monitors sodium intake Water: fair intake Caffeine: 3-4 cups of coffee  Other Providers Patient Care Team: [name], MD as PCP - General (Family Medicine)  Exercise Activities and Dietary recommendations    Goals      Patient Stated   . I should wear my glasses more (pt-stated)    . LIFESTYLE - DECREASE FALLS RISK (pt-stated)        Fall Risk Fall Risk  06/11/2019 01/29/2019 11/27/2018 10/16/2018 06/07/2017  Falls in the past year? 1 0 0 0 Yes  Number falls in past yr: 1 0 0 0 1  Injury with Fall? 1 0 - - No  Comment Bruised bilateral knees, right elbow, right pinky - - - She slipped on the ice.  Bruise only.  She did not seek medical attention.   Follow up Falls evaluation completed Falls evaluation completed Falls evaluation completed Falls evaluation completed Education provided   Timed Get Up and Go performed: no, virtual visit  Depression Screen PHQ 2/9 Scores 06/11/2019 01/29/2019 11/27/2018 10/16/2018  PHQ - 2 Score 0 0 0 0     Cognitive Function MMSE - Mini Mental State Exam 06/07/2017 05/06/2016  Orientation to time 5 5  Orientation to Place 5 5  Registration 3 3  Attention/ Calculation 5 5  Recall 3 2  Language- name 2 objects 2 2  Language- repeat 1 1  Language- follow 3 step command 3 3  Language- read & follow direction 1 1  Write a sentence 1 1  Copy design 1 1  Total score 30 29     6CIT Screen 06/11/2019 06/09/2018  What Year? 0 points 0 points  What month? 0 points 0 points  What time? 0 points 0 points  Count back from 20 0 points 0 points  Months in reverse 0 points 0 points  Repeat phrase 0 points 0 points  Total Score 0 0    Immunization History  Administered Date(s) Administered  . Fluad Quad(high Dose 65+) 10/09/2018  . Influenza, High Dose Seasonal PF 11/06/2015, 10/06/2016  . Influenza-Unspecified 10/05/2017  . Moderna SARS-COVID-2 Vaccination 03/21/2019, 05/19/2019  . Pneumococcal Conjugate-13 05/06/2016  . Pneumococcal Polysaccharide-23 06/07/2017  . Tdap 05/06/2016   Screening Tests Health Maintenance  Topic Date Due  . Fecal DNA (Cologuard)  06/10/2020 (Originally 04/28/2019)  . INFLUENZA VACCINE  08/26/2019  . MAMMOGRAM  11/06/2020  . TETANUS/TDAP  05/07/2026  . DEXA SCAN  Completed  . COVID-19 Vaccine  Completed  . Hepatitis C Screening  Completed  . PNA vac Low  Risk Adult  Completed      Plan:   Keep all routine maintenance appointments.   Follow up completed with pcp today.  Medicare Attestation I have personally reviewed: The patient's medical and social history Their use of alcohol, tobacco or illicit drugs Their current medications and supplements The patient's functional ability including ADLs,fall risks, home safety risks, cognitive, and hearing and visual impairment Diet and physical activities Evidence for depression   I have reviewed and discussed with patient certain preventive protocols, quality metrics, and best practice recommendations.      Varney Biles, LPN  [id number]

## 2019-06-11 NOTE — Patient Instructions (Signed)
Nice to see you. We will check an x-ray today.  I will confer with our pharmacist will contact you about changing the Cymbalta to an alternative medicine. I have referred you to orthopedics. Please keep an eye on your blood pressure.

## 2019-06-11 NOTE — Assessment & Plan Note (Signed)
Possible meniscal issue given the area of tenderness.  Will refer to orthopedics.

## 2019-06-11 NOTE — Progress Notes (Signed)
I have reviewed the above note and agree.  Eric Sonnenberg, M.D.  

## 2019-06-11 NOTE — Progress Notes (Signed)
[name], MD Phone: [phone]  Donna Rios is a 69 y.o. female who presents today for follow-up.  Falls: Patient notes she fell 2 to 3 months ago in the yard when she was walking on a side slope.  The second time was a day after her second moderna vaccine in April.  She was holding something in her hands and walking up stairs.  She caught a step with her foot and landed on her knees.  She also hit her right pinky during that fall and has had issues with flexing her pinky and extending it.  She notes she is much more deliberate with how she walks now.  She has been using a cane.  She notes no dizziness.  No balance issues.  No head injury with the falls.  Anxiety/depression: Patient notes her anxiety was getting better though over the last week or so it has gotten worse.  Her brother's Onnie Boer is this week and her sister asked her to provide pictures for a memory book.  The patient notes she bought some cigarettes and has smoked 3 packs during the last week as a way to take the edge off.  She does note some mild depression.  No SI.  She feels as though the Cymbalta is making her drowsy.  Belching: She does occasionally belch.  She occasionally has a discomfort in her left chest that goes away with belching.  She can also at times get up and walk around and that discomfort will improve.  She does have a history of reflux.  The symptoms do not occur very frequently.  Hypertension: Taking losartan.  No exertional chest pain.  Notes her BP has been up the last several days given the stress and anxiety she has been having.  The patient also reports she stopped her 81 mg aspirin on her own.  She was concerned about taking it with the Eliquis after her husband asked why she was taking both.  Social History   Tobacco Use  Smoking Status Current Some Day Smoker  . Packs/day: 0.50  . Years: 46.00  . Pack years: 23.00  . Types: Cigarettes  Smokeless Tobacco Never Used  Tobacco Comment   stopped 06/11/2018...smokes 1-10 per day     ROS see history of present illness  Objective  Physical Exam Vitals:   06/11/19 0950 06/11/19 1008  BP: (!) 142/90 (!) 144/90  Pulse: 89 79  Temp: (!) 97.4 F (36.3 C)   SpO2: 99% 97%    BP Readings from Last 3 Encounters:  06/11/19 (!) 144/90  03/27/19 140/80  11/27/18 (!) 150/80   Wt Readings from Last 3 Encounters:  06/11/19 218 lb (98.9 kg)  06/11/19 218 lb 6.4 oz (99.1 kg)  03/27/19 217 lb 6.4 oz (98.6 kg)    Physical Exam Constitutional:      General: She is not in acute distress.    Appearance: She is not diaphoretic.  Cardiovascular:     Rate and Rhythm: Normal rate and regular rhythm.     Heart sounds: Normal heart sounds.  Pulmonary:     Effort: Pulmonary effort is normal.     Breath sounds: Normal breath sounds.  Musculoskeletal:     Comments: Right pinky finger with inability to fully flex or extend, there is no specific tenderness of the pinky finger, there is swelling throughout the pinky finger, bilateral knees with medial joint line tenderness, no significant swelling, no other tenderness, no bony tenderness, negative McMurray's, no warmth or  erythema of either knee  Skin:    General: Skin is warm and dry.  Neurological:     Mental Status: She is alert.     Comments: 5/5 strength in bilateral biceps, triceps, grip, quads, hamstrings, plantar and dorsiflexion, sensation to light touch intact in bilateral UE and LE, slow gait with some limp      Assessment/Plan: Please see individual problem list.  Falls Discussed falls precautions.  Encouraged her to continue to walk with a cane.  Hypertension Elevated recently though she has been quite stressed.  We will have her return in 2 weeks for nurse BP check.  She will continue her losartan.  GERD (gastroesophageal reflux disease) Only has occasional symptoms.  She will monitor.  Chronic pain of both knees Possible meniscal issue given the area of  tenderness.  Will refer to orthopedics.  Injury of little finger X-ray today.  Anxiety Worsened recently.  There was a trigger.  Also with mild depression.  Given possible drowsiness with the Cymbalta will confer with our clinical pharmacist on the next best medication to treat her anxiety and depression with.  She had a similar reaction to Lexapro.   Discussed that the mechanism of action of aspirin and Eliquis are different and they serve different purposes.  Discussed it would be okay for her to take the 81 mg aspirin and her Eliquis.  Discussed that there would be a slight increased risk of bleeding with this combination though the aspirin would provide different benefits.  She is still hesitant to take this daily and notes she will take aspirin 81 mg every other day.  Orders Placed This Encounter  Procedures  . DG Finger Little Right    Standing Status:   Future    Number of Occurrences:   1    Standing Expiration Date:   08/10/2020    Order Specific Question:   Reason for Exam (SYMPTOM  OR DIAGNOSIS REQUIRED)    Answer:   fall about a month ago with injury to right 5th finger, inability to flex finger    Order Specific Question:   Preferred imaging location?    Answer:   [facility] Specific Question:   Radiology Contrast Protocol - do NOT remove file path    Answer:   [email or web address]  . Ambulatory referral to Orthopedic Surgery    Referral Priority:   Routine    Referral Type:   Surgical    Referral Reason:   Specialty Services Required    Requested Specialty:   Orthopedic Surgery    Number of Visits Requested:   1    No orders of the defined types were placed in this encounter.   This visit occurred during the SARS-CoV-2 public health emergency.  Safety protocols were in place, including screening questions prior to the visit, additional usage of staff PPE, and extensive cleaning of exam room while observing  appropriate contact time as indicated for disinfecting solutions.    I have spent 43 minutes in the care of this patient regarding history taking, chart review prior to the visit, physical exam, discussion of plan, and documentation.   [name], MD [place]

## 2019-06-11 NOTE — Assessment & Plan Note (Signed)
Elevated recently though she has been quite stressed.  We will have her return in 2 weeks for nurse BP check.  She will continue her losartan.

## 2019-06-12 ENCOUNTER — Telehealth: Payer: Self-pay | Admitting: Family Medicine

## 2019-06-12 DIAGNOSIS — G8929 Other chronic pain: Secondary | ICD-10-CM

## 2019-06-12 NOTE — Telephone Encounter (Signed)
Pt states that [facility] does not have an order for a xray on her back-just knees? Please call pt back to clarify

## 2019-06-13 ENCOUNTER — Telehealth: Payer: Self-pay | Admitting: Family Medicine

## 2019-06-13 NOTE — Telephone Encounter (Signed)
-----   Message from De Hollingshead, [facility] sent at 06/11/2019  4:22 PM EDT ----- Hmm. Looking at the antidepressant comparison table, escitalopram and duloxetine both have low levels of drowsiness. What time of the day is she taking the medication? Has she tried just taking it at night, if she feels it makes her drowsy?  Otherwise, I would just try an alternative antidepressant. Maybe sertraline? ----- Message ----- From: [name], MD Sent: 06/11/2019   2:23 PM EDT To: De Hollingshead, [place],   This patient has had issues with drowsiness with cymbalta and lexapro. She is currently on cymbalta for her anxiety and I am looking to change her to something that may not induce drowsiness. She does have mild depression currently. I wanted to see if you knew of any antidepressant/anxiety medication that may be less likely to induce drowsiness? Or should I just pick a different SSRI and see if she tolerates a different one better?  Randall Hiss

## 2019-06-13 NOTE — Telephone Encounter (Signed)
Donna Rios, can we add back pain to her orthopedic referral that has already been submitted and scheduled? Would I need to place another referral?

## 2019-06-13 NOTE — Telephone Encounter (Signed)
[facility] referral sent. Thanks

## 2019-06-13 NOTE — Telephone Encounter (Signed)
You would need to place a new referral.

## 2019-06-13 NOTE — Telephone Encounter (Signed)
I called and spoke with the patient and she stated that she wants you to add her back problem to the referral for ortho she stated that the fall she had a few weeks back is part of her mobility issues and that's why she is using the cane, her appt with Ortho is next wed. And she is having some twinges in her lower back since the fall.  Donna Rios,cma

## 2019-06-13 NOTE — Telephone Encounter (Signed)
Can you check and see what time of day the patient is taking her Cymbalta?  If she is not taking it at night I would suggest she take it at night and see if that makes a difference with the drowsiness.  If she is taking it at night then I would suggest we switch her to Zoloft.

## 2019-06-13 NOTE — Telephone Encounter (Signed)
Referral placed.

## 2019-06-14 NOTE — Telephone Encounter (Signed)
I called and spoke with the patient and informed her that one back was added to her referral and two she stated that she takes her Cymbalta at night so I informed her that in that case the provider wanted to switch her to Zoloft, the patient stated she has taken that before and she really liked it so she is okay with the switch.  Tyreon Frigon,cma

## 2019-06-15 ENCOUNTER — Telehealth: Payer: Self-pay | Admitting: Family Medicine

## 2019-06-15 MED ORDER — SERTRALINE HCL 50 MG PO TABS
ORAL_TABLET | ORAL | 1 refills | Status: DC
Start: 2019-06-15 — End: 2019-12-19

## 2019-06-15 NOTE — Telephone Encounter (Signed)
I called and informed the patient how she is to start the new medication Zoloft, and how to stop the Cymbalta and she understood.  Donna Rios,cma

## 2019-06-15 NOTE — Telephone Encounter (Signed)
Patient stated she received a call from [facility] in [place] and that's where she is going to. I informed the provider.  Collene Massimino,cma  Nareg Breighner,cma

## 2019-06-15 NOTE — Telephone Encounter (Signed)
Rejection Reason - Patient Declined" [facility], [place] said about 20 hours ago

## 2019-06-15 NOTE — Telephone Encounter (Signed)
Noted. She should start on the zoloft 25 mg once daily and decrease her cymbalta to 30 mg once daily for one week, she will then stop the cymbalta and in crease the zoloft to 50 mg once daily. Thanks.

## 2019-06-15 NOTE — Addendum Note (Signed)
Addended by: [name] on: 06/15/2019 08:35 AM   Modules accepted: Orders

## 2019-06-15 NOTE — Telephone Encounter (Signed)
Please call the patient and see why she declined this. We just discussed referring her for this. Thanks.

## 2019-06-20 ENCOUNTER — Ambulatory Visit: Payer: Self-pay

## 2019-06-20 ENCOUNTER — Other Ambulatory Visit: Payer: Self-pay

## 2019-06-20 ENCOUNTER — Telehealth: Payer: Self-pay | Admitting: Radiology

## 2019-06-20 ENCOUNTER — Encounter: Payer: Self-pay | Admitting: Orthopedic Surgery

## 2019-06-20 ENCOUNTER — Ambulatory Visit: Payer: Medicare Other | Admitting: Orthopedic Surgery

## 2019-06-20 VITALS — Ht 67.5 in | Wt 213.8 lb

## 2019-06-20 DIAGNOSIS — M170 Bilateral primary osteoarthritis of knee: Secondary | ICD-10-CM | POA: Diagnosis not present

## 2019-06-20 DIAGNOSIS — M25562 Pain in left knee: Secondary | ICD-10-CM

## 2019-06-20 DIAGNOSIS — M25561 Pain in right knee: Secondary | ICD-10-CM | POA: Diagnosis not present

## 2019-06-20 NOTE — Telephone Encounter (Signed)
Please obtain authorization for bilateral knee gel injections for Dr. Marlou Sa.

## 2019-06-20 NOTE — Telephone Encounter (Signed)
Noted  

## 2019-06-24 ENCOUNTER — Encounter: Payer: Self-pay | Admitting: Orthopedic Surgery

## 2019-06-24 DIAGNOSIS — M25562 Pain in left knee: Secondary | ICD-10-CM

## 2019-06-24 DIAGNOSIS — M170 Bilateral primary osteoarthritis of knee: Secondary | ICD-10-CM

## 2019-06-24 DIAGNOSIS — M25561 Pain in right knee: Secondary | ICD-10-CM | POA: Diagnosis not present

## 2019-06-24 MED ORDER — BUPIVACAINE HCL 0.25 % IJ SOLN
4.0000 mL | INTRAMUSCULAR | Status: AC | PRN
  Administered 2019-06-24: 4 mL via INTRA_ARTICULAR

## 2019-06-24 MED ORDER — METHYLPREDNISOLONE ACETATE 40 MG/ML IJ SUSP
40.0000 mg | INTRAMUSCULAR | Status: AC | PRN
  Administered 2019-06-24: 40 mg via INTRA_ARTICULAR

## 2019-06-24 MED ORDER — LIDOCAINE HCL 1 % IJ SOLN
5.0000 mL | INTRAMUSCULAR | Status: AC | PRN
  Administered 2019-06-24: 5 mL

## 2019-06-24 NOTE — Progress Notes (Signed)
Office Visit Note   Patient: Donna Rios           Date of Birth: 04-04-1950           MRN: [id number] Visit Date: 06/20/2019 Requested by: [name], MD [address] [place],  [place] [address] PCP: [name], MD  Subjective: Chief Complaint  Patient presents with  . Left Knee - Pain  . Right Knee - Pain    HPI: Donna Rios is a 69 year old patient with bilateral knee pain.  She is on Eliquis and baby aspirin every other day.  She has a history of surgery on the right for patella problem.  She also has a history of phlebitis on the right-hand side.  Hard for her to straighten both knees.  Reports anterior pain when she is getting up from a seated position.  She is fallen several times within the past 3 months.  She has been adjusting her medications including Cymbalta and Zoloft.              ROS: All systems reviewed are negative as they relate to the chief complaint within the history of present illness.  Patient denies  fevers or chills.   Assessment & Plan: Visit Diagnoses:  1. Acute pain of both knees     Plan: Impression is end-stage bilateral knee arthritis.  Plan is bilateral knee cortisone injections today.  Preapproved for gel injections.  She wants to avoid surgery and she is not great surgical candidate based on multiple medical comorbidities.  Nonetheless I think these injections may help to diminish marginally some of the pain that she is having in her knees.  Come back once these cortisone injections have worn off and we should have the gel injections approved by then. This patient is diagnosed with osteoarthritis of the knee(s).    Radiographs show evidence of joint space narrowing, osteophytes, subchondral sclerosis and/or subchondral cysts.  This patient has knee pain which interferes with functional and activities of daily living.    This patient has experienced inadequate response, adverse effects and/or intolerance with conservative  treatments such as acetaminophen, NSAIDS, topical creams, physical therapy or regular exercise, knee bracing and/or weight loss.   This patient has experienced inadequate response or has a contraindication to intra articular steroid injections for at least 3 months.   This patient is not scheduled to have a total knee replacement within 6 months of starting treatment with viscosupplementation.   Follow-Up Instructions: Return if symptoms worsen or fail to improve.   Orders:  Orders Placed This Encounter  Procedures  . XR KNEE 3 VIEW RIGHT  . XR KNEE 3 VIEW LEFT   No orders of the defined types were placed in this encounter.     Procedures: Large Joint Inj: bilateral knee on 06/24/2019 10:17 PM Indications: diagnostic evaluation, joint swelling and pain Details: 18 G 1.5 in needle, superolateral approach  Arthrogram: No  Medications (Right): 5 mL lidocaine 1 %; 4 mL bupivacaine 0.25 %; 40 mg methylPREDNISolone acetate 40 MG/ML Medications (Left): 5 mL lidocaine 1 %; 4 mL bupivacaine 0.25 %; 40 mg methylPREDNISolone acetate 40 MG/ML Outcome: tolerated well, no immediate complications Procedure, treatment alternatives, risks and benefits explained, specific risks discussed. Consent was given by the patient. Immediately prior to procedure a time out was called to verify the correct patient, procedure, equipment, support staff and site/side marked as required. Patient was prepped and draped in the usual sterile fashion.  Clinical Data: No additional findings.  Objective: Vital Signs: Ht 5' 7.5" (1.715 m)   Wt 213 lb 12.8 oz (97 kg)   BMI 32.99 kg/m   Physical Exam:   Constitutional: Patient appears well-developed HEENT:  Head: Normocephalic Eyes:EOM are normal Neck: Normal range of motion Cardiovascular: Normal rate Pulmonary/chest: Effort normal Neurologic: Patient is alert Skin: Skin is warm Psychiatric: Patient has normal mood and affect    Ortho Exam:  Orthopedic exam demonstrates a 5 to 10 degree flexion contracture in both knees.  Mild effusion in the left knee.  There is some venous stasis changes bilaterally in the lower legs.  Extensor mechanism intact bilaterally.  No groin pain with internal X rotation leg.  No other masses lymphadenopathy or skin changes noted in the bilateral knee region.  Specialty Comments:  No specialty comments available.  Imaging: No results found.   PMFS History: Patient Active Problem List   Diagnosis Date Noted  . Falls 06/11/2019  . Injury of little finger 06/11/2019  . Chronic pain of both knees 06/11/2019  . Atypical chest pain 03/27/2019  . Itching 03/27/2019  . Hypertension 11/28/2018  . Hair loss 10/21/2018  . Breast pain, left 10/21/2018  . Candidal intertrigo 10/21/2018  . Headache 07/18/2018  . S/P TAVR (transcatheter aortic valve replacement)   . GERD (gastroesophageal reflux disease) 06/13/2018  . Foot pain, bilateral 02/10/2018  . BPPV (benign paroxysmal positional vertigo) 02/08/2018  . Severe aortic valve stenosis 08/16/2017  . Chronic left hip pain 08/10/2017  . Rotator cuff impingement syndrome of left shoulder 08/10/2017  . Elevated BP without diagnosis of hypertension 05/09/2017  . Anxiety 05/09/2017  . Lesion of right eyelid 04/01/2017  . Hyperlipidemia 05/07/2016  . Prediabetes 11/06/2015  . Urine frequency 08/15/2015  . Venous insufficiency 06/11/2015  . Stress incontinence 06/11/2015  . Squamous cell carcinoma of skin 06/11/2015  . Former smoker 06/11/2015  . Chronic headaches 06/11/2015   Past Medical History:  Diagnosis Date  . Allergic rhinitis   . Arthritis   . Fatty liver   . Hypertension   . Phlebitis   . S/P TAVR (transcatheter aortic valve replacement)   . Severe aortic stenosis   . Stress incontinence   . Tobacco abuse     Family History  Problem Relation Age of Onset  . Alcoholism Other   . Arthritis Other   . Lung cancer Other   . Heart  disease Other   . Stroke Other   . Hypertension Other   . Diabetes Other   . Heart failure Mother   . Hypertension Mother   . Diabetes Mother   . Heart failure Father   . Hypotension Father   . Breast cancer Neg Hx     Past Surgical History:  Procedure Laterality Date  . BREAST BIOPSY Left    ducts removed  . Cataract surgery    . COLONOSCOPY WITH PROPOFOL N/A 09/29/2017   Procedure: COLONOSCOPY WITH PROPOFOL;  Surgeon: Lin Landsman, MD;  Location: [facility] ENDOSCOPY;  Service: Gastroenterology;  Laterality: N/A;  . EYE SURGERY    . FOOT SURGERY     7  . INTRAOPERATIVE TRANSTHORACIC ECHOCARDIOGRAM N/A 07/11/2018   Procedure: Intraoperative Transthoracic Echocardiogram;  Surgeon: Sherren Mocha, MD;  Location: [place];  Service: Open Heart Surgery;  Laterality: N/A;  . KNEE ARTHROSCOPY Right   . LAPAROSCOPIC HYSTERECTOMY    . RIGHT HEART CATH AND CORONARY ANGIOGRAPHY N/A 06/15/2018   Procedure: RIGHT HEART CATH AND CORONARY ANGIOGRAPHY;  Surgeon: Sherren Mocha, MD;  Location: [place] CV LAB;  Service: Cardiovascular;  Laterality: N/A;  . TRANSCATHETER AORTIC VALVE REPLACEMENT, TRANSFEMORAL  07/11/2018  . TRANSCATHETER AORTIC VALVE REPLACEMENT, TRANSFEMORAL N/A 07/11/2018   Procedure: TRANSCATHETER AORTIC VALVE REPLACEMENT, TRANSFEMORAL;  Surgeon: Sherren Mocha, MD;  Location: [place];  Service: Open Heart Surgery;  Laterality: N/A;  . VARICOSE VEIN SURGERY     Social History   Occupational History  . Not on file  Tobacco Use  . Smoking status: Current Some Day Smoker    Packs/day: 0.50    Years: 46.00    Pack years: 23.00    Types: Cigarettes  . Smokeless tobacco: Never Used  . Tobacco comment: stopped 06/11/2018...smokes 1-10 per day  Substance and Sexual Activity  . Alcohol use: Not Currently    Alcohol/week: 0.0 standard drinks  . Drug use: No  . Sexual activity: Not on file

## 2019-06-26 ENCOUNTER — Other Ambulatory Visit: Payer: Self-pay

## 2019-06-26 ENCOUNTER — Ambulatory Visit (INDEPENDENT_AMBULATORY_CARE_PROVIDER_SITE_OTHER): Payer: Medicare Other

## 2019-06-26 VITALS — BP 132/82 | HR 87

## 2019-06-26 DIAGNOSIS — I10 Essential (primary) hypertension: Secondary | ICD-10-CM | POA: Diagnosis not present

## 2019-06-26 NOTE — Progress Notes (Signed)
Patient is here for a BP check due to bp being high at last visit, as per patient.  Currently patients BP is 132/82 and BPM is 87.  Patient has no complaints of headaches, blurry vision, chest pain, arm pain, light headedness, dizziness, and nor jaw pain. Please see previous note for order.

## 2019-06-28 ENCOUNTER — Telehealth: Payer: Self-pay

## 2019-06-28 NOTE — Telephone Encounter (Signed)
Submitted VOB for SynviscOne, bilateral knee. 

## 2019-07-02 ENCOUNTER — Telehealth (HOSPITAL_COMMUNITY): Payer: Self-pay | Admitting: Physician Assistant

## 2019-07-02 NOTE — Telephone Encounter (Signed)
Spoke with patient regarding her echo appt for this week and she has been running a fever and runny nose and coughing and states she feel some better but I am wondering should we reschedule echo and appt due to her symptoms. She is schedule for Dr and echo. Please let patient know, she did sound sick and congested.

## 2019-07-03 ENCOUNTER — Encounter: Payer: Self-pay | Admitting: Internal Medicine

## 2019-07-03 ENCOUNTER — Telehealth (INDEPENDENT_AMBULATORY_CARE_PROVIDER_SITE_OTHER): Payer: Medicare Other | Admitting: Internal Medicine

## 2019-07-03 DIAGNOSIS — F419 Anxiety disorder, unspecified: Secondary | ICD-10-CM | POA: Diagnosis not present

## 2019-07-03 DIAGNOSIS — J0100 Acute maxillary sinusitis, unspecified: Secondary | ICD-10-CM | POA: Insufficient documentation

## 2019-07-03 MED ORDER — FLUCONAZOLE 150 MG PO TABS
150.0000 mg | ORAL_TABLET | Freq: Every day | ORAL | 0 refills | Status: DC
Start: 2019-07-03 — End: 2019-10-04

## 2019-07-03 MED ORDER — AMOXICILLIN-POT CLAVULANATE 875-125 MG PO TABS: 1.0000 | ORAL_TABLET | Freq: Two times a day (BID) | ORAL | 0 refills | Status: DC

## 2019-07-03 MED ORDER — PREDNISONE 10 MG PO TABS
ORAL_TABLET | ORAL | 0 refills | Status: DC
Start: 2019-07-03 — End: 2019-10-04

## 2019-07-03 NOTE — Progress Notes (Signed)
Virtual Visit via [facility]   This visit type was conducted due to national recommendations for restrictions regarding the COVID-19 pandemic (e.g. social distancing).  This format is felt to be most appropriate for this patient at this time.  All issues noted in this document were discussed and addressed.  No physical exam was performed (except for noted visual exam findings with Video Visits).   I connected with@ on 07/03/19 at  4:30 PM EDT by a video enabled telemedicine application  and verified that I am speaking with the correct person using two identifiers. Location patient: home Location provider: work or home office Persons participating in the virtual visit: patient, provider  I discussed the limitations, risks, security and privacy concerns of performing an evaluation and management service by telephone and the availability of in person appointments. I also discussed with the patient that there may be a patient responsible charge related to this service. The patient expressed understanding and agreed to proceed.  Reason for visit:  Sinusitis   HPI:  Sneezing,  Congestion started after being outside when husband was cutting the grass.  Has been using saline spray, flonase,  And zyrtec,  But congestion   Has persisted for two weeks and  now having right frontal and maxillary pain with purulent nasal rhinorrhea. Denies fevers,  Body aches,  Dyspnea .  Tmax 99.3  Has been covid vaccinated and rarely leaves the house, but if she does she continues to wear  a mask if she goes to [employer]   Staying in the house due to recent falls and knee pain .   Feeling better since resuming zoloft.     ROS: See pertinent positives and negatives per HPI.  Past Medical History:  Diagnosis Date  . Allergic rhinitis   . Arthritis   . Fatty liver   . Hypertension   . Phlebitis   . S/P TAVR (transcatheter aortic valve replacement)   . Severe aortic stenosis   . Stress incontinence   .  Tobacco abuse     Past Surgical History:  Procedure Laterality Date  . BREAST BIOPSY Left    ducts removed  . Cataract surgery    . COLONOSCOPY WITH PROPOFOL N/A 09/29/2017   Procedure: COLONOSCOPY WITH PROPOFOL;  Surgeon: Lin Landsman, MD;  Location: [facility] ENDOSCOPY;  Service: Gastroenterology;  Laterality: N/A;  . EYE SURGERY    . FOOT SURGERY     7  . INTRAOPERATIVE TRANSTHORACIC ECHOCARDIOGRAM N/A 07/11/2018   Procedure: Intraoperative Transthoracic Echocardiogram;  Surgeon: Sherren Mocha, MD;  Location: [place];  Service: Open Heart Surgery;  Laterality: N/A;  . KNEE ARTHROSCOPY Right   . LAPAROSCOPIC HYSTERECTOMY    . RIGHT HEART CATH AND CORONARY ANGIOGRAPHY N/A 06/15/2018   Procedure: RIGHT HEART CATH AND CORONARY ANGIOGRAPHY;  Surgeon: Sherren Mocha, MD;  Location: [place] [facility];  Service: Cardiovascular;  Laterality: N/A;  . TRANSCATHETER AORTIC VALVE REPLACEMENT, TRANSFEMORAL  07/11/2018  . TRANSCATHETER AORTIC VALVE REPLACEMENT, TRANSFEMORAL N/A 07/11/2018   Procedure: TRANSCATHETER AORTIC VALVE REPLACEMENT, TRANSFEMORAL;  Surgeon: Sherren Mocha, MD;  Location: [place];  Service: Open Heart Surgery;  Laterality: N/A;  . VARICOSE VEIN SURGERY      Family History  Problem Relation Age of Onset  . Alcoholism Other   . Arthritis Other   . Lung cancer Other   . Heart disease Other   . Stroke Other   . Hypertension Other   . Diabetes Other   . Heart failure Mother   .  Hypertension Mother   . Diabetes Mother   . Heart failure Father   . Hypotension Father   . Breast cancer Neg Hx     SOCIAL HX:  reports that she has been smoking cigarettes. She has a 23.00 pack-year smoking history. She has never used smokeless tobacco. She reports previous alcohol use. She reports that she does not use drugs.   Current Outpatient Medications:  .  acetaminophen (TYLENOL) 650 MG CR tablet, Take 650 mg by mouth every 8 (eight) hours as needed for pain. , Disp: , Rfl:  .   apixaban (ELIQUIS) 5 MG TABS tablet, Take 1 tablet (5 mg total) by mouth 2 (two) times daily., Disp: 60 tablet, Rfl: 11 .  APPLE CIDER VINEGAR PO, Take 1 tablet by mouth daily as needed (with fatty foods). , Disp: , Rfl:  .  aspirin EC 81 MG EC tablet, Take 1 tablet (81 mg total) by mouth daily., Disp:  , Rfl:  .  b complex vitamins tablet, Take 1 tablet by mouth every other day., Disp: , Rfl:  .  cetirizine (ZYRTEC) 10 MG tablet, Take 10 mg by mouth as needed. , Disp: , Rfl:  .  FLUAD QUADRIVALENT 0.5 ML injection, , Disp: , Rfl:  .  fluticasone (FLONASE) 50 MCG/ACT nasal spray, Place 1 spray into both nostrils daily as needed for allergies. , Disp: , Rfl:  .  ketoconazole (NIZORAL) 2 % cream, Apply 1 application topically daily., Disp: 15 g, Rfl: 0 .  Menthol, Topical Analgesic, (ICY HOT EX), Apply 1 application topically 4 (four) times daily as needed (muscle pain). , Disp: , Rfl:  .  Multiple Vitamin (MULTIVITAMIN WITH MINERALS) TABS tablet, Take 1 tablet by mouth daily. Women's Complete Multivitamin 50+, Disp: , Rfl:  .  Probiotic Product (PROBIOTIC ADVANCED PO), Take 1 tablet by mouth every other day. , Disp: , Rfl:  .  Propylene Glycol (SYSTANE COMPLETE OP), Place 1 drop into both eyes 3 (three) times daily as needed (dry/irritated eyes.). , Disp: , Rfl:  .  rosuvastatin (CRESTOR) 40 MG tablet, TAKE 1 TABLET BY MOUTH EVERY DAY, Disp: 90 tablet, Rfl: 2 .  sertraline (ZOLOFT) 50 MG tablet, Take 0.5 tablets (25 mg total) by mouth daily for 7 days, THEN 1 tablet (50 mg total) daily., Disp: 90 tablet, Rfl: 1 .  sodium chloride (OCEAN) 0.65 % SOLN nasal spray, Place 1 spray into both nostrils 4 (four) times daily as needed for congestion. , Disp: , Rfl:  .  amoxicillin-clavulanate (AUGMENTIN) 875-125 MG tablet, Take 1 tablet by mouth 2 (two) times daily., Disp: 20 tablet, Rfl: 0 .  fluconazole (DIFLUCAN) 150 MG tablet, Take 1 tablet (150 mg total) by mouth daily., Disp: 2 tablet, Rfl: 0 .  losartan  (COZAAR) 25 MG tablet, Take 1 tablet (25 mg total) by mouth daily., Disp: 90 tablet, Rfl: 3 .  predniSONE (DELTASONE) 10 MG tablet, 6 tablets on Day 1 , then reduce by 1 tablet daily until gone, Disp: 21 tablet, Rfl: 0  EXAM:  VITALS per patient if applicable:  GENERAL: alert, oriented, appears well and in no acute distress  HEENT: atraumatic, conjunttiva clear, no obvious abnormalities on inspection of external nose and ears  NECK: normal movements of the head and neck  LUNGS: on inspection no signs of respiratory distress, breathing rate appears normal, no obvious gross SOB, gasping or wheezing  CV: no obvious cyanosis  MS: moves all visible extremities without noticeable abnormality  PSYCH/NEURO: pleasant  and cooperative, no obvious depression or anxiety, speech and thought processing grossly intact  ASSESSMENT AND PLAN:  Discussed the following assessment and plan:  Acute non-recurrent maxillary sinusitis  Anxiety  Acute maxillary sinusitis Given chronicity of symptoms, development of facial pain and history consistent with bacterial URI,  Will treat with empiric antibiotics, steroid taper, .  Continue antihistamine and steroid nasal spray.  Daily use of a probiotic advised for 3 weeks.   Anxiety Improving outlook and mood since resuming zoloft.     I discussed the assessment and treatment plan with the patient. The patient was provided an opportunity to ask questions and all were answered. The patient agreed with the plan and demonstrated an understanding of the instructions.   The patient was advised to call back or seek an in-person evaluation if the symptoms worsen or if the condition fails to improve as anticipated.  I provided  30 minutes of non-face-to-face time during this encounter reviewing patient's current problems and past surgeries, labs and imaging studies, providing counseling on the above mentioned problems , and coordination  of care .   Crecencio Mc, MD

## 2019-07-03 NOTE — Telephone Encounter (Signed)
The patient has had congestion, green phlegm, and cough for a few days. She has had her COVID vaccinations and thinks her symptoms are related to her allergies.  Rescheduled the patient for 1 year TAVR visits to 6/30.  She understands to contact PCP if symptoms do not subside or if fever occurs. She will hydrate and take OTC allergy medication in the meantime.

## 2019-07-04 ENCOUNTER — Telehealth: Payer: Self-pay

## 2019-07-04 ENCOUNTER — Other Ambulatory Visit (HOSPITAL_COMMUNITY): Payer: Medicare Other

## 2019-07-04 ENCOUNTER — Ambulatory Visit: Payer: Medicare Other | Admitting: Physician Assistant

## 2019-07-04 NOTE — Assessment & Plan Note (Signed)
Improving outlook and mood since resuming zoloft.

## 2019-07-04 NOTE — Assessment & Plan Note (Signed)
Given chronicity of symptoms, development of facial pain and history consistent with bacterial URI,  Will treat with empiric antibiotics, steroid taper, .  Continue antihistamine and steroid nasal spray.  Daily use of a probiotic advised for 3 weeks.

## 2019-07-04 NOTE — Telephone Encounter (Signed)
Patient aware that she is approved for gel injection.  Approved, SynviscOne, bilateral knee. [place] Patient will be responsible for 20% OOP. Co-pay of $30.00 required No PA required

## 2019-07-23 NOTE — Progress Notes (Signed)
[facility] [place]                                       Cardiology Office Note    Date:  07/25/2019   ID:  Donna Rios, DOB Apr 27, 1950, MRN [id number]  PCP:  [name], MD  Cardiologist: Dr. Rockey Situ / Dr. Burt Knack & Dr. Cyndia Bent (TAVR)  CC: 1 year s/p TAVR   History of Present Illness:  Donna Rios is a 69 y.o. female with a history of HTN, arthritis, anxiety, tobacco abuse, and bicuspid AoV with severe AS s/p TAVR (07/11/18) who presents to clinic for follow up.   Patient had had a heart murmur for at least 20 years. She had known AS. Follow-up echocardiogram in 03/2018 showed progression of her aortic stenosis to critical with her mean aortic valve gradient increasing from 46 mm 1 year ago to 60 mm hg. She subsequently underwent cardiac catheterization on 06/15/2018 which showed widely patent coronary arteries with no obstructive coronary disease.   She was evaluated by the multidisciplinary valve team and underwent successful TAVR with a 26 mm Edwards Sapien 3 THV via the TF approach on 07/11/18. She had a brief run of Afib and was cardioverted back to sinus rhythm without recurrence and was felt to be procedure related. Post operative echo showed normal prosthetic valve function with a mean gradient of 11.3 mm Hg. She was started Aspirin and plavix. She also requested xanax for anxiety. 1 month echo showed 7/17 showed EF 60%, normally functioning TAVR with mean gradient of 10 mm Hg and no PVL. Follow up event monitor did show a brief episode of afib. She was started on Eliquis and plavix was discontinued.  Today she presents to clinic for follow up. Has had several falls recently due to mechianical imbalance and now walking a cane. Emotional today because she has had several family members passed recently. No CP or SOB. No LE edema, orthopnea or PND. No dizziness or syncope. No blood in stool or urine. No palpitations.     Past  Medical History:  Diagnosis Date  . Allergic rhinitis   . Arthritis   . Fatty liver   . Hypertension   . Phlebitis   . S/P TAVR (transcatheter aortic valve replacement)   . Severe aortic stenosis   . Stress incontinence   . Tobacco abuse     Past Surgical History:  Procedure Laterality Date  . BREAST BIOPSY Left    ducts removed  . Cataract surgery    . COLONOSCOPY WITH PROPOFOL N/A 09/29/2017   Procedure: COLONOSCOPY WITH PROPOFOL;  Surgeon: Lin Landsman, MD;  Location: [facility] ENDOSCOPY;  Service: Gastroenterology;  Laterality: N/A;  . EYE SURGERY    . FOOT SURGERY     7  . INTRAOPERATIVE TRANSTHORACIC ECHOCARDIOGRAM N/A 07/11/2018   Procedure: Intraoperative Transthoracic Echocardiogram;  Surgeon: Sherren Mocha, MD;  Location: [facility];  Service: Open Heart Surgery;  Laterality: N/A;  . KNEE ARTHROSCOPY Right   . LAPAROSCOPIC HYSTERECTOMY    . RIGHT HEART CATH AND CORONARY ANGIOGRAPHY N/A 06/15/2018   Procedure: RIGHT HEART CATH AND CORONARY ANGIOGRAPHY;  Surgeon: Sherren Mocha, MD;  Location: [facility] CV LAB;  Service: Cardiovascular;  Laterality: N/A;  . TRANSCATHETER AORTIC VALVE REPLACEMENT, TRANSFEMORAL  07/11/2018  . TRANSCATHETER AORTIC VALVE REPLACEMENT, TRANSFEMORAL N/A 07/11/2018   Procedure: TRANSCATHETER  AORTIC VALVE REPLACEMENT, TRANSFEMORAL;  Surgeon: Sherren Mocha, MD;  Location: [facility];  Service: Open Heart Surgery;  Laterality: N/A;  . VARICOSE VEIN SURGERY      Current Medications: Outpatient Medications Prior to Visit  Medication Sig Dispense Refill  . acetaminophen (TYLENOL) 650 MG CR tablet Take 650 mg by mouth every 8 (eight) hours as needed for pain.     Marland Kitchen apixaban (ELIQUIS) 5 MG TABS tablet Take 1 tablet (5 mg total) by mouth 2 (two) times daily. 60 tablet 11  . APPLE CIDER VINEGAR PO Take 1 tablet by mouth daily as needed (with fatty foods).     Marland Kitchen aspirin EC 81 MG EC tablet Take 1 tablet (81 mg total) by mouth daily.    Marland Kitchen b complex vitamins  tablet Take 1 tablet by mouth every other day.    . cetirizine (ZYRTEC) 10 MG tablet Take 10 mg by mouth as needed.     Marland Kitchen FLUAD QUADRIVALENT 0.5 ML injection     . fluconazole (DIFLUCAN) 150 MG tablet Take 1 tablet (150 mg total) by mouth daily. 2 tablet 0  . fluticasone (FLONASE) 50 MCG/ACT nasal spray Place 1 spray into both nostrils daily as needed for allergies.     Marland Kitchen ketoconazole (NIZORAL) 2 % cream Apply 1 application topically daily. 15 g 0  . lidocaine-prilocaine (EMLA) cream Apply 1 application topically as needed.    Marland Kitchen losartan (COZAAR) 25 MG tablet Take 1 tablet (25 mg total) by mouth daily. 90 tablet 3  . Menthol, Topical Analgesic, (ICY HOT EX) Apply 1 application topically 4 (four) times daily as needed (muscle pain).     . Multiple Vitamin (MULTIVITAMIN WITH MINERALS) TABS tablet Take 1 tablet by mouth daily. Women's Complete Multivitamin 50+    . predniSONE (DELTASONE) 10 MG tablet 6 tablets on Day 1 , then reduce by 1 tablet daily until gone 21 tablet 0  . Probiotic Product (PROBIOTIC ADVANCED PO) Take 1 tablet by mouth every other day.     Marland Kitchen Propylene Glycol (SYSTANE COMPLETE OP) Place 1 drop into both eyes 3 (three) times daily as needed (dry/irritated eyes.).     Marland Kitchen rosuvastatin (CRESTOR) 40 MG tablet TAKE 1 TABLET BY MOUTH EVERY DAY 90 tablet 2  . sertraline (ZOLOFT) 50 MG tablet Take 0.5 tablets (25 mg total) by mouth daily for 7 days, THEN 1 tablet (50 mg total) daily. 90 tablet 1  . sodium chloride (OCEAN) 0.65 % SOLN nasal spray Place 1 spray into both nostrils 4 (four) times daily as needed for congestion.     Marland Kitchen amoxicillin-clavulanate (AUGMENTIN) 875-125 MG tablet Take 1 tablet by mouth 2 (two) times daily. (Patient not taking: Reported on 07/25/2019) 20 tablet 0   No facility-administered medications prior to visit.     Allergies:   Ibuprofen, Asa [aspirin], and Iohexol   Social History   Socioeconomic History  . Marital status: Married    Spouse name: Not on file   . Number of children: Not on file  . Years of education: Not on file  . Highest education level: Not on file  Occupational History  . Not on file  Tobacco Use  . Smoking status: Current Some Day Smoker    Packs/day: 0.50    Years: 46.00    Pack years: 23.00    Types: Cigarettes  . Smokeless tobacco: Never Used  . Tobacco comment: stopped 06/11/2018...smokes 1-10 per day  Vaping Use  . Vaping Use: Never used  Substance and Sexual Activity  . Alcohol use: Not Currently    Alcohol/week: 0.0 standard drinks  . Drug use: No  . Sexual activity: Not on file  Other Topics Concern  . Not on file  Social History Narrative  . Not on file   Social Determinants of Health   Financial Resource Strain:   . Difficulty of Paying Living Expenses:   Food Insecurity:   . Worried About Charity fundraiser in the Last Year:   . Arboriculturist in the Last Year:   Transportation Needs:   . Film/video editor (Medical):   Marland Kitchen Lack of Transportation (Non-Medical):   Physical Activity:   . Days of Exercise per Week:   . Minutes of Exercise per Session:   Stress:   . Feeling of Stress :   Social Connections:   . Frequency of Communication with Friends and Family:   . Frequency of Social Gatherings with Friends and Family:   . Attends Religious Services:   . Active Member of Clubs or Organizations:   . Attends Archivist Meetings:   Marland Kitchen Marital Status:      Family History:  The patient's family history includes Alcoholism in an other family member; Arthritis in an other family member; Diabetes in her mother and another family member; Heart disease in an other family member; Heart failure in her father and mother; Hypertension in her mother and another family member; Hypotension in her father; Lung cancer in an other family member; Stroke in an other family member.      ROS:   Please see the history of present illness.    ROS   All other systems reviewed and are  negative.   PHYSICAL EXAM:   VS:  BP 110/76   Pulse 80   Ht 5' 7.5" (1.715 m)   Wt 212 lb 6.4 oz (96.3 kg)   SpO2 98%   BMI 32.78 kg/m    GEN: Well nourished, well developed, in no acute distress HEENT: normal Neck: no JVD or masses Cardiac: RRR; soft flow murmur.  No rubs, or gallops,no edema  Respiratory: clear to auscultation bilaterally, normal work of breathing GI: soft, nontender, nondistended, + BS MS: no deformity or atrophy Skin: warm and dry, no rash. Chronic venous stasis. Neuro:  Alert and Oriented x 3, Strength and sensation are intact Psych: euthymic mood, full affect   Wt Readings from Last 3 Encounters:  07/25/19 212 lb 6.4 oz (96.3 kg)  07/03/19 213 lb (96.6 kg)  06/20/19 213 lb 12.8 oz (97 kg)      Studies/Labs Reviewed:   EKG:  EKG is NOT ordered today.    Recent Labs: 10/16/2018: TSH 0.81 02/21/2019: BUN 12; Creatinine, Ser 0.79; Potassium 4.0; Sodium 136 03/27/2019: ALT 18   Lipid Panel    Component Value Date/Time   CHOL 164 07/25/2018 1121   TRIG 248 (H) 07/25/2018 1121   HDL 51 07/25/2018 1121   CHOLHDL 3.2 07/25/2018 1121   VLDL 50 (H) 07/25/2018 1121   LDLCALC 63 07/25/2018 1121   LDLDIRECT 90.8 07/25/2018 1121    Additional studies/ records that were reviewed today include:  TAVR OPERATIVE NOTE   Date of Procedure:07/11/2018  Preoperative Diagnosis:Severe Aortic Stenosis   Postoperative Diagnosis:Same   Procedure:   Transcatheter Aortic Valve Replacement - PercutaneousRightTransfemoral Approach Edwards Sapien 3 THV (size 53mm, model # 9600TFX, serial # [id number])  Co-Surgeons:Bryan Alveria Apley, MD and Sherren Mocha, MD   Anesthesiologist:Robert Ola Spurr, MD  Echocardiographer:[name] Johnsie Cancel, MD  Pre-operative Echo Findings: ? Severe aortic stenosis ? Normalleft ventricular systolic  function  Post-operative Echo Findings: ? noparavalvular leak ? Normalleft ventricular systolic function  _____________    Echo 07/12/18 IMPRESSIONS  1. A 26 Edwards Sapien bioprosthetic aortic valve (TAVR) valve is present in the aortic position. Procedure Date: 07/11/2018 Normal aortic valve prosthesis. Echo findings shows no evidence of rocking, dehiscence, regurgitation or perivalvular leak of the  aortic prosthesis.  2. Mean systolic AV gradient 11 mmHg. Using an LVOT diameter of 2.5 cm, the calculated aortic valve area is 2.6 cm2.  3. The left ventricle has normal systolic function with an ejection fraction of 60-65%. The cavity size was normal. Left ventricular diastolic Doppler parameters are indeterminate.  4. The right ventricle has normal systolic function. The cavity was normal. There is no increase in right ventricular wall thickness.  5. The inferior vena cava was normal in size with <50% respiratory variability.  _____________    Echo 08/11/18 IMPRESSIONS  1. The left ventricle has normal systolic function with an ejection fraction of 60-65%. The cavity size was normal. There is mild to moderately increased left ventricular wall thickness. Left ventricular diastolic Doppler parameters are consistent with  impaired relaxation.  2. The right ventricle has normal systolic function. The cavity was normal. There is no increase in right ventricular wall thickness. Unable to estimate RVSP  3. Left atrial size was mildly dilated.  4. A 26 Edwards Sapien bioprosthetic aortic valve (TAVR) valve is present in the aortic position. Procedure Date: 07/11/18 Normal aortic valve prosthesis.  5. The aortic valve was not well visualized. Aortic valve regurgitation is trivial . Mean gradient 10 mm Hg, peak gradient 24 mm Hg , estimated AVA 2.45 cm sq  ______________________   Echo 07/25/19 IMPRESSIONS  1. Left ventricular ejection fraction, by estimation, is 70 to 75%. The left  ventricle has hyperdynamic function. The left ventricle has no regional wall motion abnormalities. There is mild concentric left ventricular hypertrophy. Left ventricular  diastolic parameters are consistent with Grade I diastolic dysfunction (impaired relaxation).  2. Right ventricular systolic function is normal. The right ventricular size is normal.  3. The mitral valve is normal in structure. No evidence of mitral valve regurgitation.  4. The aortic valve has been repaired/replaced. Aortic valve regurgitation is not visualized. There is a 26 mm TAVR Edwards Sapien prosthetic (TAVR) valve present in the aortic position. Procedure Date: 07/11/2018. Echo findings are consistent with  normal structure and function of the aortic valve prosthesis. Aortic valve mean gradient measures 8.0 mmHg. Aortic valve Vmax measures 2.17 m/s.   ASSESSMENT & PLAN:   Severe AS s/p TAVR: Echo today shows EF 70% normally functioning TAVR with a mean gradient of 8 mmHg and no PVL. She has NYHA class II symptoms. Currently on Eliquis (started after found to have afib on monitor) and aspirin. Plan to stop aspirin at this time. SBE prophylaxis discussed; I have RX'd amoxicillin. She will continued regular follow up with Dr. Rockey Situ with serial annual echos.   Medication Adjustments/Labs and Tests Ordered: Current medicines are reviewed at length with the patient today.  Concerns regarding medicines are outlined above.  Medication changes, Labs and Tests ordered today are listed in the Patient Instructions below. There are no Patient Instructions on file for this visit.   Signed, Angelena Form, PA-C  07/25/2019 2:39 PM    [facility] [address], [place], [place]  [address] Phone: [phone])  [phone]; Fax: [phone]    

## 2019-07-25 ENCOUNTER — Ambulatory Visit: Payer: Medicare Other | Admitting: Physician Assistant

## 2019-07-25 ENCOUNTER — Ambulatory Visit (HOSPITAL_COMMUNITY): Payer: Medicare Other | Attending: Cardiovascular Disease

## 2019-07-25 ENCOUNTER — Encounter: Payer: Self-pay | Admitting: Physician Assistant

## 2019-07-25 ENCOUNTER — Other Ambulatory Visit: Payer: Self-pay

## 2019-07-25 VITALS — BP 110/76 | HR 80 | Ht 67.5 in | Wt 212.4 lb

## 2019-07-25 DIAGNOSIS — Z952 Presence of prosthetic heart valve: Secondary | ICD-10-CM

## 2019-07-25 MED ORDER — AMOXICILLIN 500 MG PO TABS
ORAL_TABLET | ORAL | 12 refills | Status: DC
Start: 2019-07-25 — End: 2020-07-04

## 2019-07-25 NOTE — Patient Instructions (Signed)
Medication Instructions:  1) STOP ASPIRIN *If you need a refill on your cardiac medications before your next appointment, please call your pharmacy*   Follow-Up: At [facility], you and your health needs are our priority.  As part of our continuing mission to provide you with exceptional heart care, we have created designated Provider Care Teams.  These Care Teams include your primary Cardiologist (physician) and Advanced Practice Providers (APPs -  Physician Assistants and Nurse Practitioners) who all work together to provide you with the care you need, when you need it. Your next appointment:   3 month(s) The format for your next appointment:   In Person Provider:    You may see Dr. Rockey Situ or one of the following Advanced Practice Providers on your designated Care Team:    Murray Hodgkins, NP  Christell Faith, PA-C  Marrianne Mood, PA-C

## 2019-08-02 ENCOUNTER — Ambulatory Visit: Payer: Medicare Other | Admitting: Orthopedic Surgery

## 2019-08-02 ENCOUNTER — Ambulatory Visit (INDEPENDENT_AMBULATORY_CARE_PROVIDER_SITE_OTHER): Payer: Medicare Other

## 2019-08-02 DIAGNOSIS — M170 Bilateral primary osteoarthritis of knee: Secondary | ICD-10-CM

## 2019-08-02 DIAGNOSIS — M545 Low back pain, unspecified: Secondary | ICD-10-CM

## 2019-08-04 ENCOUNTER — Encounter: Payer: Self-pay | Admitting: Orthopedic Surgery

## 2019-08-04 DIAGNOSIS — M170 Bilateral primary osteoarthritis of knee: Secondary | ICD-10-CM | POA: Diagnosis not present

## 2019-08-04 MED ORDER — HYLAN G-F 20 48 MG/6ML IX SOSY
48.0000 mg | PREFILLED_SYRINGE | INTRA_ARTICULAR | Status: AC | PRN
  Administered 2019-08-04: 48 mg via INTRA_ARTICULAR

## 2019-08-04 MED ORDER — LIDOCAINE HCL 1 % IJ SOLN
5.0000 mL | INTRAMUSCULAR | Status: AC | PRN
  Administered 2019-08-04: 5 mL

## 2019-08-04 MED ORDER — LIDOCAINE HCL 1 % IJ SOLN
5.0000 mL | INTRAMUSCULAR | Status: AC | PRN
Start: 2019-08-04 — End: 2019-08-04
  Administered 2019-08-04: 5 mL

## 2019-08-04 NOTE — Progress Notes (Signed)
Office Visit Note   Patient: Donna Rios           Date of Birth: [birth year]           MRN: [id number] Visit Date: 08/02/2019 Requested by: [name], MD [address] [place],  [place] [address] PCP: [name], MD  Subjective: Chief Complaint  Patient presents with  . Left Knee - Pain  . Right Knee - Pain  . Lower Back - Pain    HPI: Donna Rios is a patient with bilateral knee arthritis who presents for gel injection.  She also describes low back pain.  States that she fell and has since has had some low back pain and some mild left radicular pain.  Hard for her to straighten up at times.  Denies any discrete groin pain.  Denies any radicular symptoms below the knees.  She has been taking some over-the-counter medication with mild relief.  Denies any bowel and bladder symptoms.              ROS: All systems reviewed are negative as they relate to the chief complaint within the history of present illness.  Patient denies  fevers or chills.   Assessment & Plan: Visit Diagnoses:  1. Primary osteoarthritis of both knees   2. Low back pain, unspecified back pain laterality, unspecified chronicity, unspecified whether sciatica present     Plan: Impression is bilateral knee arthritis.  Bilateral gel injections performed today.  Lighted Derm cream is helping.  Regarding the back there are no red flag symptoms today.  Radiographs do not show any acute fracture.  I think this is something we can watch for now.  She is on anticoagulants so MRI scan and injection could be more complicated than it normally is.  I do not think that her pain symptoms are reaching that threshold of difficulty yet.  We talked about physical therapy but she does not really think the value of therapy is more than the cost of the co-pay.  Follow-Up Instructions: Return in about 3 months (around 11/02/2019).   Orders:  Orders Placed This Encounter  Procedures  . XR Lumbar Spine 2-3 Views    No orders of the defined types were placed in this encounter.     Procedures: Large Joint Inj: bilateral knee on 08/04/2019 9:02 PM Indications: pain, joint swelling and diagnostic evaluation Details: 18 G 1.5 in needle, superolateral approach  Arthrogram: No  Medications (Right): 5 mL lidocaine 1 %; 48 mg Hylan 48 MG/6ML Medications (Left): 5 mL lidocaine 1 %; 48 mg Hylan 48 MG/6ML Outcome: tolerated well, no immediate complications Procedure, treatment alternatives, risks and benefits explained, specific risks discussed. Consent was given by the patient. Immediately prior to procedure a time out was called to verify the correct patient, procedure, equipment, support staff and site/side marked as required. Patient was prepped and draped in the usual sterile fashion.       Clinical Data: No additional findings.  Objective: Vital Signs: There were no vitals taken for this visit.  Physical Exam:   Constitutional: Patient appears well-developed HEENT:  Head: Normocephalic Eyes:EOM are normal Neck: Normal range of motion Cardiovascular: Normal rate Pulmonary/chest: Effort normal Neurologic: Patient is alert Skin: Skin is warm Psychiatric: Patient has normal mood and affect    Ortho Exam: Ortho exam demonstrates no nerve root tension signs.  5 out of 5 ankle dorsiflexion plantarflexion quad hamstring strength.  No groin pain with internal/external rotation of the  leg.  No masses lymphadenopathy or skin changes noted in bilateral knee region.  No effusion is present.  Mild pain with forward and lateral bending.  No saddle paresthesias.  Specialty Comments:  No specialty comments available.  Imaging: No results found.   PMFS History: Patient Active Problem List   Diagnosis Date Noted  . Acute maxillary sinusitis 07/03/2019  . Falls 06/11/2019  . Injury of little finger 06/11/2019  . Chronic pain of both knees 06/11/2019  . Atypical chest pain 03/27/2019  .  Itching 03/27/2019  . Hypertension 11/28/2018  . Hair loss 10/21/2018  . Breast pain, left 10/21/2018  . Candidal intertrigo 10/21/2018  . Headache 07/18/2018  . S/P TAVR (transcatheter aortic valve replacement)   . GERD (gastroesophageal reflux disease) 06/13/2018  . Foot pain, bilateral 02/10/2018  . BPPV (benign paroxysmal positional vertigo) 02/08/2018  . Severe aortic valve stenosis 08/16/2017  . Chronic left hip pain 08/10/2017  . Rotator cuff impingement syndrome of left shoulder 08/10/2017  . Elevated BP without diagnosis of hypertension 05/09/2017  . Anxiety 05/09/2017  . Lesion of right eyelid 04/01/2017  . Hyperlipidemia 05/07/2016  . Prediabetes 11/06/2015  . Urine frequency 08/15/2015  . Venous insufficiency 06/11/2015  . Stress incontinence 06/11/2015  . Squamous cell carcinoma of skin 06/11/2015  . Former smoker 06/11/2015  . Chronic headaches 06/11/2015   Past Medical History:  Diagnosis Date  . Allergic rhinitis   . Arthritis   . Fatty liver   . Hypertension   . Phlebitis   . S/P TAVR (transcatheter aortic valve replacement)   . Severe aortic stenosis   . Stress incontinence   . Tobacco abuse     Family History  Problem Relation Age of Onset  . Alcoholism Other   . Arthritis Other   . Lung cancer Other   . Heart disease Other   . Stroke Other   . Hypertension Other   . Diabetes Other   . Heart failure Mother   . Hypertension Mother   . Diabetes Mother   . Heart failure Father   . Hypotension Father   . Breast cancer Neg Hx     Past Surgical History:  Procedure Laterality Date  . BREAST BIOPSY Left    ducts removed  . Cataract surgery    . COLONOSCOPY WITH PROPOFOL N/A 09/29/2017   Procedure: COLONOSCOPY WITH PROPOFOL;  Surgeon: Lin Landsman, MD;  Location: [facility] ENDOSCOPY;  Service: Gastroenterology;  Laterality: N/A;  . EYE SURGERY    . FOOT SURGERY     7  . INTRAOPERATIVE TRANSTHORACIC ECHOCARDIOGRAM N/A 07/11/2018   Procedure:  Intraoperative Transthoracic Echocardiogram;  Surgeon: Sherren Mocha, MD;  Location: [facility];  Service: Open Heart Surgery;  Laterality: N/A;  . KNEE ARTHROSCOPY Right   . LAPAROSCOPIC HYSTERECTOMY    . RIGHT HEART CATH AND CORONARY ANGIOGRAPHY N/A 06/15/2018   Procedure: RIGHT HEART CATH AND CORONARY ANGIOGRAPHY;  Surgeon: Sherren Mocha, MD;  Location: [facility] CV LAB;  Service: Cardiovascular;  Laterality: N/A;  . TRANSCATHETER AORTIC VALVE REPLACEMENT, TRANSFEMORAL  07/11/2018  . TRANSCATHETER AORTIC VALVE REPLACEMENT, TRANSFEMORAL N/A 07/11/2018   Procedure: TRANSCATHETER AORTIC VALVE REPLACEMENT, TRANSFEMORAL;  Surgeon: Sherren Mocha, MD;  Location: [facility];  Service: Open Heart Surgery;  Laterality: N/A;  . VARICOSE VEIN SURGERY     Social History   Occupational History  . Not on file  Tobacco Use  . Smoking status: Current Some Day Smoker    Packs/day: 0.50    Years:  46.00    Pack years: 23.00    Types: Cigarettes  . Smokeless tobacco: Never Used  . Tobacco comment: stopped 06/11/2018...smokes 1-10 per day  Vaping Use  . Vaping Use: Never used  Substance and Sexual Activity  . Alcohol use: Not Currently    Alcohol/week: 0.0 standard drinks  . Drug use: No  . Sexual activity: Not on file

## 2019-08-22 ENCOUNTER — Other Ambulatory Visit: Payer: Self-pay | Admitting: Cardiovascular Disease

## 2019-08-22 NOTE — Telephone Encounter (Signed)
Pt's age 68, wt 96.3 kg, SCr 0.79, CrCl 103.61, last ov w KT 07/25/19.

## 2019-08-22 NOTE — Telephone Encounter (Signed)
Please review for refill. Thanks!  

## 2019-09-12 ENCOUNTER — Encounter: Payer: Medicare Other | Admitting: Family Medicine

## 2019-09-18 ENCOUNTER — Ambulatory Visit (INDEPENDENT_AMBULATORY_CARE_PROVIDER_SITE_OTHER): Payer: Medicare Other | Admitting: Family Medicine

## 2019-09-18 ENCOUNTER — Other Ambulatory Visit: Payer: Self-pay

## 2019-09-18 ENCOUNTER — Telehealth: Payer: Self-pay

## 2019-09-18 ENCOUNTER — Encounter: Payer: Self-pay | Admitting: Family Medicine

## 2019-09-18 VITALS — BP 140/84 | HR 78 | Temp 98.6°F | Ht 67.52 in | Wt 209.2 lb

## 2019-09-18 DIAGNOSIS — Z130 Encounter for screening for diseases of the blood and blood-forming organs and certain disorders involving the immune mechanism: Secondary | ICD-10-CM

## 2019-09-18 DIAGNOSIS — F17210 Nicotine dependence, cigarettes, uncomplicated: Secondary | ICD-10-CM | POA: Insufficient documentation

## 2019-09-18 DIAGNOSIS — E782 Mixed hyperlipidemia: Secondary | ICD-10-CM | POA: Diagnosis not present

## 2019-09-18 DIAGNOSIS — I10 Essential (primary) hypertension: Secondary | ICD-10-CM | POA: Diagnosis not present

## 2019-09-18 DIAGNOSIS — Z1231 Encounter for screening mammogram for malignant neoplasm of breast: Secondary | ICD-10-CM

## 2019-09-18 DIAGNOSIS — H5712 Ocular pain, left eye: Secondary | ICD-10-CM

## 2019-09-18 DIAGNOSIS — Z0001 Encounter for general adult medical examination with abnormal findings: Secondary | ICD-10-CM

## 2019-09-18 DIAGNOSIS — Z780 Asymptomatic menopausal state: Secondary | ICD-10-CM | POA: Diagnosis not present

## 2019-09-18 DIAGNOSIS — N644 Mastodynia: Secondary | ICD-10-CM

## 2019-09-18 DIAGNOSIS — I350 Nonrheumatic aortic (valve) stenosis: Secondary | ICD-10-CM

## 2019-09-18 DIAGNOSIS — F419 Anxiety disorder, unspecified: Secondary | ICD-10-CM

## 2019-09-18 LAB — CBC
HCT: 38.8 % (ref 36.0–46.0)
Hemoglobin: 13.3 g/dL (ref 12.0–15.0)
MCHC: 34.3 g/dL (ref 30.0–36.0)
MCV: 91.5 fl (ref 78.0–100.0)
Platelets: 191 10*3/uL (ref 150.0–400.0)
RBC: 4.24 Mil/uL (ref 3.87–5.11)
RDW: 13.3 % (ref 11.5–15.5)
WBC: 10.7 10*3/uL — ABNORMAL HIGH (ref 4.0–10.5)

## 2019-09-18 LAB — COMPREHENSIVE METABOLIC PANEL
ALT: 15 U/L (ref 0–35)
AST: 20 U/L (ref 0–37)
Albumin: 4.4 g/dL (ref 3.5–5.2)
Alkaline Phosphatase: 92 U/L (ref 39–117)
BUN: 9 mg/dL (ref 6–23)
CO2: 27 mEq/L (ref 19–32)
Calcium: 9.6 mg/dL (ref 8.4–10.5)
Chloride: 101 mEq/L (ref 96–112)
Creatinine, Ser: 0.73 mg/dL (ref 0.40–1.20)
GFR: 79.03 mL/min (ref >60.00)
Glucose, Bld: 105 mg/dL — ABNORMAL HIGH (ref 70–99)
Potassium: 4.2 mEq/L (ref 3.5–5.1)
Sodium: 136 mEq/L (ref 135–145)
Total Bilirubin: 0.5 mg/dL (ref 0.2–1.2)
Total Protein: 6.8 g/dL (ref 6.0–8.3)

## 2019-09-18 LAB — LIPID PANEL
Cholesterol: 153 mg/dL (ref 0–200)
HDL: 51 mg/dL (ref >39.00)
NonHDL: 101.99
Total CHOL/HDL Ratio: 3
Triglycerides: 235 mg/dL — ABNORMAL HIGH (ref 0.0–149.0)
VLDL: 47 mg/dL — ABNORMAL HIGH (ref 0.0–40.0)

## 2019-09-18 LAB — HEMOGLOBIN A1C: Hgb A1c MFr Bld: 6.1 % (ref 4.6–6.5)

## 2019-09-18 LAB — LDL CHOLESTEROL, DIRECT: Direct LDL: 71 mg/dL

## 2019-09-18 NOTE — Assessment & Plan Note (Signed)
Physical exam completed.  Encouraged healthy diet and continued changes to her diet.  Discussed adding in some exercise.  Offered physical therapy for her balance though she declines this.  She wants to do some exercise at home on her own. Patient will call to schedule her mammogram and bone density scan. She will get a flu vaccine later in the season. Advised to quit smoking.  She will let us know when she is ready. Lab work as outlined below.

## 2019-09-18 NOTE — Assessment & Plan Note (Signed)
Somewhat improved.  She will remain on Zoloft 25 mg once daily for the next month or so and if she continues to feel well she could discontinue at that time.  She will let us know if she has further issues.

## 2019-09-18 NOTE — Chronic Care Management (AMB) (Signed)
  Chronic Care Management   Outreach Note  09/18/2019 Name: Donna Rios MRN: [id number] DOB: [birth year]  Donna Rios is a 68 y.o. year old female who is a primary care patient of [name], [name], MD. I reached out to Donna Rios by phone today in response to a referral sent by Donna Rios PCP, Dr. [name]      An unsuccessful telephone outreach was attempted today. The patient was referred to the case management team for assistance with care management and care coordination.   Follow Up Plan: A HIPPA compliant phone message was left for the patient providing contact information and requesting a return call.  The care management team will reach out to the patient again over the next 7 days.  If patient returns call to provider office, please advise to call [facility] [place], [place], [place], [place] [address] Direct Dial: [phone] [email or web address]: [email or web address]

## 2019-09-18 NOTE — Progress Notes (Signed)
[name], MD Phone: [phone]  Donna Rios is a 69 y.o. female who presents today for CPE.  Diet: Pork and vegetables.  She is worked on cutting down on sugar that is in her sweet tea. Exercise: Walks some for exercise.  Uses a cane.  Has had a few falls related to possible mild residual vertigo where she feels unsteady when she gets up. Pap smear: Status post hysterectomy Colonoscopy: 09/29/2017.  No polyps noted.  10-year recall. Mammogram: 11/07/2018.  Negative. Family history-  Colon cancer: No  Breast cancer: No  Ovarian cancer: No Menses: Status post hysterectomy Vaccines-   Flu: will get later this season  Tetanus: UTD  Shingles: got the first one on 08/24/19  COVID19: UTD  Pneumonia: UTD Hep C Screening: UTD Tobacco use: <1 PPD Alcohol use: no Illicit Drug use: no Dentist: not recently Ophthalmology: yes  Eye pain: Notes left eye pain only when bright sunlight shines in it.  No pain at any other time.  No vision changes.  Anxiety: Notes this is well controlled.  She is decreased her Zoloft to 25 mg once daily and feels good on that.  She notes she is having a hard time affording her Eliquis.  Active Ambulatory Problems    Diagnosis Date Noted  . Venous insufficiency 06/11/2015  . Stress incontinence 06/11/2015  . Squamous cell carcinoma of skin 06/11/2015  . Former smoker 06/11/2015  . Chronic headaches 06/11/2015  . Urine frequency 08/15/2015  . Prediabetes 11/06/2015  . Hyperlipidemia 05/07/2016  . Lesion of right eyelid 04/01/2017  . Elevated BP without diagnosis of hypertension 05/09/2017  . Anxiety 05/09/2017  . Chronic left hip pain 08/10/2017  . Rotator cuff impingement syndrome of left shoulder 08/10/2017  . Severe aortic valve stenosis 08/16/2017  . BPPV (benign paroxysmal positional vertigo) 02/08/2018  . Foot pain, bilateral 02/10/2018  . GERD (gastroesophageal reflux disease) 06/13/2018  . S/P TAVR (transcatheter aortic valve  replacement)   . Headache 07/18/2018  . Hair loss 10/21/2018  . Breast pain, left 10/21/2018  . Candidal intertrigo 10/21/2018  . Hypertension 11/28/2018  . Atypical chest pain 03/27/2019  . Itching 03/27/2019  . Falls 06/11/2019  . Injury of little finger 06/11/2019  . Chronic pain of both knees 06/11/2019  . Encounter for general adult medical examination with abnormal findings 09/18/2019  . Postmenopausal estrogen deficiency 09/18/2019  . Nicotine dependence, cigarettes, uncomplicated 89/21/1941   Resolved Ambulatory Problems    Diagnosis Date Noted  . Heart murmur 06/11/2015  . Allergic rhinitis 08/06/2015  . Cellulitis of leg, left 12/11/2015  . Personal history of tobacco use, presenting hazards to health 05/26/2016  . Sinusitis 08/10/2017  . Colon cancer screening   . Respiratory illness 01/02/2018  . Neck pain 02/08/2018  . Head injury 02/08/2018  . Vaginal discharge 11/28/2018  . Acute maxillary sinusitis 07/03/2019   Past Medical History:  Diagnosis Date  . Arthritis   . Fatty liver   . Phlebitis   . Severe aortic stenosis   . Tobacco abuse     Family History  Problem Relation Age of Onset  . Alcoholism Other   . Arthritis Other   . Lung cancer Other   . Heart disease Other   . Stroke Other   . Hypertension Other   . Diabetes Other   . Heart failure Mother   . Hypertension Mother   . Diabetes Mother   . Heart failure Father   . Hypotension Father   . Breast  cancer Neg Hx     Social History   Socioeconomic History  . Marital status: Married    Spouse name: Not on file  . Number of children: Not on file  . Years of education: Not on file  . Highest education level: Not on file  Occupational History  . Not on file  Tobacco Use  . Smoking status: Current Some Day Smoker    Packs/day: 0.50    Years: 46.00    Pack years: 23.00    Types: Cigarettes  . Smokeless tobacco: Never Used  . Tobacco comment: stopped 06/11/2018...smokes 1-10 per day    Vaping Use  . Vaping Use: Never used  Substance and Sexual Activity  . Alcohol use: Not Currently    Alcohol/week: 0.0 standard drinks  . Drug use: No  . Sexual activity: Not on file  Other Topics Concern  . Not on file  Social History Narrative  . Not on file   Social Determinants of Health   Financial Resource Strain:   . Difficulty of Paying Living Expenses: Not on file  Food Insecurity:   . Worried About Charity fundraiser in the Last Year: Not on file  . Ran Out of Food in the Last Year: Not on file  Transportation Needs:   . Lack of Transportation (Medical): Not on file  . Lack of Transportation (Non-Medical): Not on file  Physical Activity:   . Days of Exercise per Week: Not on file  . Minutes of Exercise per Session: Not on file  Stress:   . Feeling of Stress : Not on file  Social Connections:   . Frequency of Communication with Friends and Family: Not on file  . Frequency of Social Gatherings with Friends and Family: Not on file  . Attends Religious Services: Not on file  . Active Member of Clubs or Organizations: Not on file  . Attends Archivist Meetings: Not on file  . Marital Status: Not on file  Intimate Partner Violence:   . Fear of Current or Ex-Partner: Not on file  . Emotionally Abused: Not on file  . Physically Abused: Not on file  . Sexually Abused: Not on file    ROS  General:  Negative for nexplained weight loss, fever Skin: Negative for new or changing mole, sore that won't heal HEENT: Negative for trouble hearing, trouble seeing, ringing in ears, mouth sores, hoarseness, change in voice, dysphagia. CV:  Negative for chest pain, dyspnea, edema, palpitations Resp: Negative for cough, dyspnea, hemoptysis GI: Negative for nausea, vomiting, diarrhea, constipation, abdominal pain, melena, hematochezia. GU: Negative for dysuria, incontinence, urinary hesitance, hematuria, vaginal or penile discharge, polyuria, sexual difficulty, lumps in  testicle or breasts MSK: Negative for muscle cramps or aches, joint pain or swelling Neuro: Negative for headaches, weakness, numbness, dizziness, passing out/fainting Psych: Negative for depression, memory problems  Objective  Physical Exam Vitals:   09/18/19 0937 09/18/19 1021  BP: (!) 150/110 140/84  Pulse: 78   Temp: 98.6 F (37 C)   SpO2: 96%     BP Readings from Last 3 Encounters:  09/18/19 140/84  07/25/19 110/76  06/26/19 132/82   Wt Readings from Last 3 Encounters:  09/18/19 209 lb 3.2 oz (94.9 kg)  07/25/19 212 lb 6.4 oz (96.3 kg)  07/03/19 213 lb (96.6 kg)    Physical Exam Constitutional:      General: She is not in acute distress.    Appearance: She is not diaphoretic.  HENT:  Head: Normocephalic and atraumatic.  Eyes:     Conjunctiva/sclera: Conjunctivae normal.     Pupils: Pupils are equal, round, and reactive to light.  Cardiovascular:     Rate and Rhythm: Normal rate and regular rhythm.     Heart sounds: Normal heart sounds.  Pulmonary:     Effort: Pulmonary effort is normal.     Breath sounds: Normal breath sounds.  Abdominal:     General: Bowel sounds are normal. There is no distension.     Palpations: Abdomen is soft.     Tenderness: There is no abdominal tenderness. There is no guarding or rebound.  Genitourinary:    Comments: Henrene Pastor, RN served as chaperone, bilateral breast with slight tenderness just inferior to the areola that the patient reports is chronic for several years, no other tenderness, no masses, nipple inversion, or skin changes in bilateral breast, no axillary masses bilaterally Musculoskeletal:     Right lower leg: No edema.     Left lower leg: No edema.  Lymphadenopathy:     Cervical: No cervical adenopathy.  Skin:    General: Skin is warm and dry.  Neurological:     Mental Status: She is alert.  Psychiatric:     Comments: Mild anxiety      Assessment/Plan:   Encounter for general adult medical examination  with abnormal findings Physical exam completed.  Encouraged healthy diet and continued changes to her diet.  Discussed adding in some exercise.  Offered physical therapy for her balance though she declines this.  She wants to do some exercise at home on her own. Patient will call to schedule her mammogram and bone density scan. She will get a flu vaccine later in the season. Advised to quit smoking.  She will let us know when she is ready. Lab work as outlined below.  Anxiety Somewhat improved.  She will remain on Zoloft 25 mg once daily for the next month or so and if she continues to feel well she could discontinue at that time.  She will let us know if she has further issues.  Breast pain, left Chronic intermittent issue that the patient reports has been going on for years.  Also has some tenderness in her right breast at times.  She has had multiple negative mammograms throughout the time that she has had these symptoms.  She will monitor for any changes.  Severe aortic valve stenosis Will refer to our clinical pharmacist for assistance with her Eliquis.  Nicotine dependence, cigarettes, uncomplicated Smoking cessation counseling was provided.  Approximately 4 minutes were spent discussing the rationale for tobacco cessation and strategies for doing so.  Adjuncts, including nicotine patches, nicotine lozenges, varenicline and buproprion were recommended. F/u 3 months for this.    Orders Placed This Encounter  Procedures  . DG Bone Density    Standing Status:   Future    Standing Expiration Date:   09/17/2020    Order Specific Question:   Reason for Exam (SYMPTOM  OR DIAGNOSIS REQUIRED)    Answer:   postmenopausal estrogen deficiency    Order Specific Question:   Preferred imaging location?    Answer:   [facility]  . MM 3D SCREEN BREAST BILATERAL    Standing Status:   Future    Standing Expiration Date:   09/17/2020    Order Specific Question:   Reason for Exam (SYMPTOM  OR  DIAGNOSIS REQUIRED)    Answer:   breast cancer screening    Order  Specific Question:   Preferred imaging location?    Answer:   [facility]  . Comp Met (CMET)  . Lipid panel  . HgB A1c  . CBC  . Ambulatory referral to Chronic Care Management Services    Referral Priority:   Routine    Referral Type:   Consultation    Referral Reason:   Care Coordination    Number of Visits Requested:   1  . Ambulatory referral to Ophthalmology    Referral Priority:   Routine    Referral Type:   Consultation    Referral Reason:   Specialty Services Required    Requested Specialty:   Ophthalmology    Number of Visits Requested:   1    No orders of the defined types were placed in this encounter.   This visit occurred during the SARS-CoV-2 public health emergency.  Safety protocols were in place, including screening questions prior to the visit, additional usage of staff PPE, and extensive cleaning of exam room while observing appropriate contact time as indicated for disinfecting solutions.    [name], MD [place]

## 2019-09-18 NOTE — Assessment & Plan Note (Signed)
Smoking cessation counseling was provided.  Approximately 4 minutes were spent discussing the rationale for tobacco cessation and strategies for doing so.  Adjuncts, including nicotine patches, nicotine lozenges, varenicline and buproprion were recommended. F/u 3 months for this.

## 2019-09-18 NOTE — Assessment & Plan Note (Signed)
Chronic intermittent issue that the patient reports has been going on for years.  Also has some tenderness in her right breast at times.  She has had multiple negative mammograms throughout the time that she has had these symptoms.  She will monitor for any changes.

## 2019-09-18 NOTE — Patient Instructions (Signed)
Nice to see you. Please call to schedule your bone density scan and mammogram. Our pharmacist will call you to see if she can provide any assistance with your Eliquis. We will get lab work today and contact you with the results. Please try to exercise as you are able to.  Please do some exercises to help strengthen your legs. When you are ready to quit smoking please let us know. I have referred you to the eye doctor.  If you develop persistent eye pain please seek medical attention immediately.

## 2019-09-18 NOTE — Assessment & Plan Note (Signed)
Will refer to our clinical pharmacist for assistance with her Eliquis.

## 2019-09-20 ENCOUNTER — Other Ambulatory Visit: Payer: Self-pay | Admitting: Family Medicine

## 2019-09-20 DIAGNOSIS — D72829 Elevated white blood cell count, unspecified: Secondary | ICD-10-CM

## 2019-09-21 ENCOUNTER — Other Ambulatory Visit: Payer: Self-pay | Admitting: Family Medicine

## 2019-09-25 DIAGNOSIS — H43813 Vitreous degeneration, bilateral: Secondary | ICD-10-CM | POA: Diagnosis not present

## 2019-09-25 DIAGNOSIS — L568 Other specified acute skin changes due to ultraviolet radiation: Secondary | ICD-10-CM | POA: Diagnosis not present

## 2019-10-02 ENCOUNTER — Telehealth: Payer: Self-pay | Admitting: Pharmacist

## 2019-10-02 NOTE — Chronic Care Management (AMB) (Signed)
  Chronic Care Management   Note  10/02/2019 Name: Donna Rios MRN: [id number] DOB: Sep 17, 1950  Donna Rios is a 69 y.o. year old female who is a primary care patient of [name], [name], MD. I reached out to [place] by phone today in response to a referral sent by [name] PCP, Dr. [name]     [name] was given information about Chronic Care Management services today including:  1. CCM service includes personalized support from designated clinical staff supervised by her physician, including individualized plan of care and coordination with other care providers 2. 24/7 contact phone numbers for assistance for urgent and routine care needs. 3. Service will only be billed when office clinical staff spend 20 minutes or more in a month to coordinate care. 4. Only one practitioner may furnish and bill the service in a calendar month. 5. The patient may stop CCM services at any time (effective at the end of the month) by phone call to the office staff. 6. The patient will be responsible for cost sharing (co-pay) of up to 20% of the service fee (after annual deductible is met).  Patient agreed to services and verbal consent obtained.   Follow up plan: Telephone appointment with care management team member scheduled for:10/26/2019  Noreene Larsson, [place], [place], [place] [address] Direct Dial: [phone] [email or web address]

## 2019-10-02 NOTE — Telephone Encounter (Signed)
Received message from scheduling Care Guide that patient only had 1 week of Eliquis remaining. Scheduled urgent work-in CCM initial call later this month.   Prepared sample of Eliquis for patient to pick up to tide over until we can determine a long term solution  Drug name: Eliquis       Strength: 5 mg        Qty: 56 (4 boxes) LOT: [id number]   Exp.Date: 06/2021   Samples placed at the front desk

## 2019-10-04 ENCOUNTER — Ambulatory Visit: Payer: Medicare Other | Admitting: Pharmacist

## 2019-10-04 ENCOUNTER — Other Ambulatory Visit: Payer: Self-pay

## 2019-10-04 ENCOUNTER — Other Ambulatory Visit (INDEPENDENT_AMBULATORY_CARE_PROVIDER_SITE_OTHER): Payer: Medicare Other

## 2019-10-04 DIAGNOSIS — F419 Anxiety disorder, unspecified: Secondary | ICD-10-CM

## 2019-10-04 DIAGNOSIS — I350 Nonrheumatic aortic (valve) stenosis: Secondary | ICD-10-CM

## 2019-10-04 DIAGNOSIS — D72829 Elevated white blood cell count, unspecified: Secondary | ICD-10-CM

## 2019-10-04 DIAGNOSIS — E782 Mixed hyperlipidemia: Secondary | ICD-10-CM

## 2019-10-04 DIAGNOSIS — F32A Depression, unspecified: Secondary | ICD-10-CM

## 2019-10-04 LAB — CBC WITH DIFFERENTIAL/PLATELET
Basophils Absolute: 0.1 10*3/uL (ref 0.0–0.1)
Basophils Relative: 0.8 % (ref 0.0–3.0)
Eosinophils Absolute: 0.1 10*3/uL (ref 0.0–0.7)
Eosinophils Relative: 1.4 % (ref 0.0–5.0)
HCT: 38.4 % (ref 36.0–46.0)
Hemoglobin: 13 g/dL (ref 12.0–15.0)
Lymphocytes Relative: 29.5 % (ref 12.0–46.0)
Lymphs Abs: 2.2 10*3/uL (ref 0.7–4.0)
MCHC: 33.9 g/dL (ref 30.0–36.0)
MCV: 93 fl (ref 78.0–100.0)
Monocytes Absolute: 0.5 10*3/uL (ref 0.1–1.0)
Monocytes Relative: 6.4 % (ref 3.0–12.0)
Neutro Abs: 4.5 10*3/uL (ref 1.4–7.7)
Neutrophils Relative %: 61.9 % (ref 43.0–77.0)
Platelets: 176 10*3/uL (ref 150.0–400.0)
RBC: 4.13 Mil/uL (ref 3.87–5.11)
RDW: 13.4 % (ref 11.5–15.5)
WBC: 7.3 10*3/uL (ref 4.0–10.5)

## 2019-10-04 NOTE — Patient Instructions (Signed)
Donna Rios,   It was GREAT talking to you today!  Please outreach the [place] Quitline to get some support in quitting smoking. We will talk more about starting patches + gum or lozenges at our next call.   My colleagues (nurse care Freight forwarder and then clinical social worker) will be outreaching you to help support your healthcare goals.   I need to know 1) Total income (your social security + husband's social security + husband pension) 2) Your total copay spend since 01/26/2019 3) Your husband's total copay spend since 01/26/2019  Call me at any time with any questions or concerns!  Donna Rios, PharmD, [place], [place] [phone]  Visit Information  Goals Addressed              This Visit's Progress     Patient Stated   .  PharmD "I need help affording this medication" (pt-stated)        CARE PLAN ENTRY (see longitudinal plan of care for additional care plan information)  Current Barriers:  . Social, financial, community barriers:  o Reports that she is in the [employer], cost of Eliquis increased recently o Through our conversation, notes concerns with depression and anxiety. Multiple recent deaths in the family over the past few weeks. Concerns w/ healthy and productive communication with family members. Anxiety coping strategies include tobacco use, and unable to exercise like she would like to.  . Polypharmacy; complex patient with multiple comorbidities including aortic valve stenosis s/p TAVR (06/2018), HTN, tobacco abuse, prediabetes, Afib, depression . Uses a weekly pill box . Most recent eGFR: 80 mL/min . Afib: Eliquis 5 mg BID, CHADS2VASc score 3 (age, sex, CHF). Hx brief run of post operative Afib. Follows w/ Dr. Rockey Situ, next appt 10/2019 . HTN: losartan 25 mg QAM; BP NOT at goal at last office visit  . Depression: sertraline 25 mg daily. Reported that she felt it was "too much" to be on 50 mg  o Hx escitalopram, bupropion, duloxetine o Reports she went to a  "mandala" years ago, was "there for the required 2 weeks", but did not receive therapy or support like she was anticipating. Notes a "psychiatrist" also told her "you are an adult, get over it" regarding her anxiety . ASCVD risk reduction: rosuvastatin 40 mg QPM; last LDL right at goal of <70 . Supplements: calcium + Vitamin D; notes 1-2 servings of dairy daily. Has calcium + vit D tablet at home but wasn't sure how much she needed  . Tobacco abuse: smoked since age 37, has quit several times, but would only last for a few months; never more than 1 ppd; currently smoking ~1 ppd. Reports that she quit for 2 days prior to TAVR surgery  o Previously on bupropion for mood, did not tolerate.  o Reports triggers to smoke include: anxiety (especially when discussing caring for stepson); also notes that she had a high-stress job of quality control. Reports that walking/exercised did help w/ anxiety instead of smoking. Had been doing [facility] Sneakers at the [facility] for exercise, but COVID has prevented that. o Reports motivation to quit smoking includes: her health, knows it is a "bad, nasty habit"  Pharmacist Clinical Goal(s):  Marland Kitchen Over the next 90 days, patient will work with PharmD and provider towards optimized medication management  Interventions: . Comprehensive medication review performed; medication list updated in electronic medical record . Inter-disciplinary care team collaboration (see longitudinal plan of care) . Reviewed Eliquis cost concerns. Reviewed concepts of Medicare Coverage  Gap. Discussed Eliquis patient assistance program. Patient will investigate her 1) total household income 2) personal and her husband's total drug spend and let me know this information. If patient meets eligibility criteria, we will pursue Eliquis assistance through BMS . If patient does NOT meet criteria for BMS assistance, will discuss possibility of transition to Xarelto w/ [facility] $85/month program for Xarelto w/  cardiology team.  . Will encourage patient to communicate w/ cardiology about benefit vs risk of anticoagulation, given history of small Afib burden. Marland Kitchen Extensive discussion regarding patient's anxiety, depression, coping mechanisms. No SI/HI. Patient amenable to referral to RN CM and LCSW for disease management support and therapy support.  . Extensive discussion of tobacco cessation goals. Discussed importance of behavioral support along with pharmacotherapy. Encouraged patient to contact [place] Quitline. Discussed Chantix vs dual nicotine replacement therapy, preference to NRT at this point d/t supply concerns w/ Chantix. She would prefer to focus on medication access first, will discuss pharmacotherapy moving forward. LCSW referral for support for coping mechanisms other than tobacco.  . Extensive review of OTC medications. Reviewed recommendation for calcium 1200 mg + vitamin D [phone] units daily. Reviewed that given dietary calcium intake, patient can target 1 tablet of 600 mg Ca + Vit D daily   Patient Self Care Activities:  . Patient will take medications as prescribed . Patient will investigate necessary financial information . Patient will collaborate w/ RN CM and LCSW . Patient will contact [place] Quitline  Initial goal documentation        Ms. Shipton was given information about Chronic Care Management services today including:  1. CCM service includes personalized support from designated clinical staff supervised by her physician, including individualized plan of care and coordination with other care providers 2. 24/7 contact phone numbers for assistance for urgent and routine care needs. 3. Service will only be billed when office clinical staff spend 20 minutes or more in a month to coordinate care. 4. Only one practitioner may furnish and bill the service in a calendar month. 5. The patient may stop CCM services at any time (effective at the end of the month) by phone call to the  office staff. 6. The patient will be responsible for cost sharing (co-pay) of up to 20% of the service fee (after annual deductible is met).  Patient agreed to services and verbal consent obtained.   The patient verbalized understanding of instructions provided today and agreed to receive a mailed copy of patient instruction and/or educational materials.  Plan: - Scheduled f/u call in ~ 4 weeks  Donna Rios, PharmD, [place], [place] Pharmacist [place] [phone]

## 2019-10-04 NOTE — Telephone Encounter (Signed)
Patient picked up samples

## 2019-10-04 NOTE — Chronic Care Management (AMB) (Signed)
**Note Donna-Identified via Obfuscation** Chronic Care Management   Note  10/04/2019 Name: Donna Rios MRN: [id number] DOB: 09-08-1950   Subjective:  Donna Rios is a 69 y.o. year old female who is a primary care patient of Donna Rios, Donna Adam, MD. The CCM team was consulted for assistance with chronic disease management and care coordination needs.    Contacted patient for initial medication access and medication management support.   Donna Rios was given information about Chronic Care Management services today including:  1. CCM service includes personalized support from designated clinical staff supervised by her physician, including individualized plan of care and coordination with other care providers 2. 24/7 contact phone numbers for assistance for urgent and routine care needs. 3. Service will only be billed when office clinical staff spend 20 minutes or more in a month to coordinate care. 4. Only one practitioner may furnish and bill the service in a calendar month. 5. The patient may stop CCM services at any time (effective at the end of the month) by phone call to the office staff. 6. The patient will be responsible for cost sharing (co-pay) of up to 20% of the service fee (after annual deductible is met).  Patient agreed to services and verbal consent obtained.   Review of patient status, including review of consultants reports, laboratory and other test data, was performed as part of comprehensive evaluation and provision of chronic care management services.   SDOH (Social Determinants of Health) assessments and interventions performed:  SDOH Interventions     Most Recent Value  SDOH Interventions  SDOH Interventions for the Following Domains Depression, Stress, Tobacco  Financial Strain Interventions Other (Comment)  [manufacturer assitsance review]  Stress Interventions Other (Comment), Provide Counseling  [LCSW referral]  Tobacco Interventions Cessation Materials Given and Reviewed, Other (Comment)  [[place]  quitline suggested]  Depression Interventions/Treatment  Counseling  [LCSW referral]       Objective:  Lab Results  Component Value Date   CREATININE 0.73 09/18/2019   CREATININE 0.79 02/21/2019   CREATININE 0.84 01/31/2019    Lab Results  Component Value Date   HGBA1C 6.1 09/18/2019       Component Value Date/Time   CHOL 153 09/18/2019 1010   TRIG 235.0 (H) 09/18/2019 1010   HDL 51.00 09/18/2019 1010   CHOLHDL 3 09/18/2019 1010   VLDL 47.0 (H) 09/18/2019 1010   LDLCALC 63 07/25/2018 1121   LDLDIRECT 71.0 09/18/2019 1010    Clinical ASCVD: No  The 10-year ASCVD risk score Mikey Bussing DC Jr., et al., 2013) is: 20.2%   Values used to calculate the score:     Age: 29 years     Sex: Female     Is Non-Hispanic African American: No     Diabetic: No     Tobacco smoker: Yes     Systolic Blood Pressure: 409 mmHg     Is BP treated: Yes     HDL Cholesterol: 51 mg/dL     Total Cholesterol: 153 mg/dL    BP Readings from Last 3 Encounters:  09/18/19 140/84  07/25/19 110/76  06/26/19 132/82    Allergies  Allergen Reactions  . Ibuprofen Other (See Comments)    High does causes stomach pains  . Asa [Aspirin] Other (See Comments)    UNSPECIFIED REACTION   . Iohexol Rash    Pt had reaction after 06/21/18 TAVR CT scans    Medications Reviewed Today    Reviewed by Donna Rios, [facility] (Pharmacist) on 10/04/19 at 1139  Med List Status: <None>  Medication Order Taking? Sig Documenting Provider Last Dose Status Informant  acetaminophen (TYLENOL) 650 MG CR tablet [id number] Yes Take 650 mg by mouth every 8 (eight) hours as needed for pain.  [provider] Taking Active Self  amoxicillin (AMOXIL) 500 MG tablet [id number] No Take 4 capsules (2,000 mg) one hour prior to all dental visits.  Patient not taking: Reported on 10/04/2019   Eileen Stanford, PA-C Not Taking Active   APPLE CIDER VINEGAR PO [id number] Yes Take 1 tablet by mouth daily as needed (with fatty foods).   [provider] Taking Active Self  b complex vitamins tablet [id number] Yes Take 1 tablet by mouth 2 (two) times a week.  [provider] Taking Active Self  cetirizine (ZYRTEC) 10 MG tablet [id number] Yes Take 10 mg by mouth as needed.  [provider] Taking Active Self  ELIQUIS 5 MG TABS tablet [id number] Yes TAKE 1 TABLET BY MOUTH TWICE A DAY Gollan, Kathlene November, MD Taking Active   fluticasone (FLONASE) 50 MCG/ACT nasal spray [id number] Yes Place 1 spray into both nostrils daily as needed for allergies.  [provider] Taking Active Self  ketoconazole (NIZORAL) 2 % cream [id number] No Apply 1 application topically daily.  Patient not taking: Reported on 10/04/2019   [name], MD Not Taking Active   Lidocaine 4 % SOLN [id number] Yes Apply topically as needed. [provider] Taking Active   losartan (COZAAR) 25 MG tablet [id number] Yes Take 1 tablet (25 mg total) by mouth daily. Minna Merritts, MD Taking Active   Menthol, Topical Analgesic, (ICY HOT EX) [id number] Yes Apply 1 application topically 4 (four) times daily as needed (muscle pain).  [provider] Taking Active Self  Multiple Vitamin (MULTIVITAMIN WITH MINERALS) TABS tablet [id number] Yes Take 1 tablet by mouth daily. Women's Complete Multivitamin 50+ [provider] Taking Active Self  Probiotic Product (PROBIOTIC ADVANCED PO) [id number] Yes Take 1 tablet by mouth every other day.  [provider] Taking Active Self  Propylene Glycol (SYSTANE COMPLETE OP) [id number] Yes Place 1 drop into both eyes 3 (three) times daily as needed (dry/irritated eyes.).  [provider] Taking Active Self  rosuvastatin (CRESTOR) 40 MG tablet [id number] Yes TAKE 1 TABLET BY MOUTH EVERY DAY [name], MD Taking Active   sertraline (ZOLOFT) 50 MG tablet [id number] Yes Take 0.5 tablets (25 mg total) by mouth daily for 7 days, THEN 1 tablet (50 mg total) daily. [name], MD Taking Active            Med Note [name], Maralyn Sago Oct 04, 2019 11:37 AM) 25 mg daily  sodium chloride (OCEAN) 0.65 % SOLN nasal spray [id number] Yes Place 1 spray into both nostrils 4 (four) times daily as needed for congestion.  [provider] Taking Active Self           Assessment:   Goals Addressed              This Visit's Progress     Patient Stated   .  PharmD "I need help affording this medication" (pt-stated)        CARE PLAN ENTRY (see longitudinal plan of care for additional care plan information)  Current Barriers:  . Social, financial, community barriers:  o Reports that she is in the [employer], cost of Eliquis increased recently o Through our conversation, notes concerns with depression and anxiety. Multiple  recent deaths in the family over the past few weeks. Concerns w/ healthy and productive communication with family members. Anxiety coping strategies include tobacco use, and unable to exercise like she would like to.  . Polypharmacy; complex patient with multiple comorbidities including aortic valve stenosis s/p TAVR (06/2018), HTN, tobacco abuse, prediabetes, Afib, depression . Uses a weekly pill box . Most recent eGFR: 80 mL/min . Afib: Eliquis 5 mg BID, CHADS2VASc score 3 (age, sex, CHF). Hx brief run of post operative Afib. Follows w/ Dr. Rockey Situ, next appt 10/2019 . HTN: losartan 25 mg QAM; BP NOT at goal at last office visit  . Depression: sertraline 25 mg daily. Reported that she felt it was "too much" to be on 50 mg  o Hx escitalopram, bupropion, duloxetine o Reports she went to a "mandala" years ago, was "there for the required 2 weeks", but did not receive therapy or support like she was anticipating. Notes a "psychiatrist" also told her "you are an adult, get over it" regarding her anxiety . ASCVD risk reduction: rosuvastatin 40 mg QPM; last LDL right at goal of <70 . Supplements: calcium + Vitamin D; notes 1-2  servings of dairy daily. Has calcium + vit D tablet at home but wasn't sure how much she needed  . Tobacco abuse: smoked since age 22, has quit several times, but would only last for a few months; never more than 1 ppd; currently smoking ~1 ppd. Reports that she quit for 2 days prior to TAVR surgery  o Previously on bupropion for mood, did not tolerate.  o Reports triggers to smoke include: anxiety (especially when discussing caring for stepson); also notes that she had a high-stress job of quality control. Reports that walking/exercised did help w/ anxiety instead of smoking. Had been doing [facility] [employer]neakers at the [facility] for exercise, but COVID has prevented that. o Reports motivation to quit smoking includes: her health, knows it is a "bad, nasty habit"  Pharmacist Clinical Goal(s):  Marland Kitchen Over the next 90 days, patient will work with PharmD and provider towards optimized medication management  Interventions: . Comprehensive medication review performed; medication list updated in electronic medical record . Inter-disciplinary care team collaboration (see longitudinal plan of care) . Reviewed Eliquis cost concerns. Reviewed concepts of Medicare Coverage Gap. Discussed Eliquis patient assistance program. Patient will investigate her 1) total household income 2) personal and her husband's total drug spend and let me know this information. If patient meets eligibility criteria, we will pursue Eliquis assistance through BMS . If patient does NOT meet criteria for BMS assistance, will discuss possibility of transition to Xarelto w/ [facility] $85/month program for Xarelto w/ cardiology team.  . Will encourage patient to communicate w/ cardiology about benefit vs risk of anticoagulation, given history of small Afib burden. Marland Kitchen Extensive discussion regarding patient's anxiety, depression, coping mechanisms. No SI/HI. Patient amenable to referral to RN CM and LCSW for disease management support and therapy  support.  . Extensive discussion of tobacco cessation goals. Discussed importance of behavioral support along with pharmacotherapy. Encouraged patient to contact [place] Quitline. Discussed Chantix vs dual nicotine replacement therapy, preference to NRT at this point d/t supply concerns w/ Chantix. She would prefer to focus on medication access first, will discuss pharmacotherapy moving forward. LCSW referral for support for coping mechanisms other than tobacco.  . Extensive review of OTC medications. Reviewed recommendation for calcium 1200 mg + vitamin D [phone] units daily. Reviewed that given dietary calcium intake, patient can target 1  tablet of 600 mg Ca + Vit D daily   Patient Self Care Activities:  . Patient will take medications as prescribed . Patient will investigate necessary financial information . Patient will collaborate w/ RN CM and LCSW . Patient will contact [place] Quitline  Initial goal documentation        Plan: - Scheduled f/u call in ~ 4 weeks  Donna Rios, PharmD, [place], [place] Pharmacist [place] [phone]

## 2019-10-10 ENCOUNTER — Other Ambulatory Visit: Payer: Self-pay | Admitting: *Deleted

## 2019-10-10 NOTE — Patient Outreach (Addendum)
[facility]) Care Management  10/10/2019  Donna Rios Mar 28, 1950 [id number]   CSW received referral for grief counseling. CSW attempted to reach pt and left a HIPPA compliant voice message. CSW will attempt another outreach in 3-4 business days if no return call is received.     Eduard Clos, MSW, [place] Worker  [place] [phone]

## 2019-10-12 ENCOUNTER — Other Ambulatory Visit: Payer: Self-pay | Admitting: *Deleted

## 2019-10-12 NOTE — Patient Outreach (Addendum)
[facility]) Care Management  10/12/2019  Donna Rios 08-Nov-1950 [id number]   CSW made contact with pt and confirmed her identity.  CSW introduced self, role and reason for call- grief/loss.  Pt shared with CSW how she has had many family deaths in the last 2 years.  3 deaths back to back in October 2020 and then her mother in law. "along with this COVID mess it has been rough".   CSW talked with pt about the grief program at hospice.  Pt shared that her father was a Environmental education officer and she got a book from someone at [employer].  "It's a book about grief".  Pt is willing to consider the hospice programs yet feels she and her family talk together and help each other through the deaths.CSW validated the importance of this type of support within the family and talked with her about the benefits of the hospice programs too.   CSW will mail info to her to review and consider and plan a follow up call in a few weeks.   Eduard Clos, MSW, [place] Worker  [place] [phone]

## 2019-10-16 ENCOUNTER — Other Ambulatory Visit: Payer: Self-pay | Admitting: *Deleted

## 2019-10-16 ENCOUNTER — Encounter: Payer: Self-pay | Admitting: *Deleted

## 2019-10-16 NOTE — Patient Outreach (Signed)
[facility]) Care Management [place], new referral- routine/ PCP  10/16/2019  Donna Rios 1950-01-29 [id number]  Successful initial outreach attempt to Donna Rios, 69 y/o female referred to [facility] CM 10/04/19 for routine PCP referral by [facility] CMA.  Patient currently active with [facility] Pharmacist embedded at PCP office.  Patient has history including, but not limited to, AS with TAVR in June 2020; A-Fib; HTN/ HLD; anxiety; GERD; ongoing tobacco use.  Patient has had no recent unplanned hospital admissions.  HIPAA/ identity verified for name/ dob and purpose of call discussed.  Patient reports that she is just waking up from a nap this afternoon and doesn't feel like talking; she requests call back again next week, after 3 pm, on any day other than Tuesday 10/23/19.  Plan:  Will re-attempt call to patient within 4 business days, as she has requested  Oneta Rack, RN, BSN, [place] Coordinator [facility] Care Management  [phone]

## 2019-10-17 ENCOUNTER — Other Ambulatory Visit: Payer: Self-pay | Admitting: Cardiovascular Disease

## 2019-10-17 NOTE — Telephone Encounter (Signed)
Pt's age 69, wt 94.9 kg SCr 0.73, CrCl 108.97, last ov w/ KT 07/25/19.

## 2019-10-17 NOTE — Telephone Encounter (Signed)
Refill Request.  

## 2019-10-22 ENCOUNTER — Encounter: Payer: Self-pay | Admitting: *Deleted

## 2019-10-22 ENCOUNTER — Other Ambulatory Visit: Payer: Self-pay | Admitting: *Deleted

## 2019-10-22 NOTE — Patient Outreach (Addendum)
[facility]) Care Management [facility] CM Telephone Outreach, Screening/ new PCP referral  10/22/2019  Donna Rios Dec 19, 1950 [id number]  Successful second outreach attempt to Donna Rios, 69 y/o female referred to [facility] CM 10/04/19 for routine PCP referral by [facility] CMA.  Patient currently active with [facility] Pharmacist embedded at PCP office.  Patient has history including, but not limited to, AS with TAVR in June 2020; A-Fib; HTN/ HLD; anxiety; GERD; ongoing tobacco use.  Patient has had no recent unplanned hospital admissions.  HIPAA/ identity verified for name/ dob and purpose of call discussed.  Patient reports that she has talked over the [facility] CM program with her husband and states that she has decided that she will just pay the out-of pocket cost for her medications until she is out of the donut hole; reports that she is going to look into switching to [place], as she has heard they offer better coverage for medications.  Patient further reports that she believes that after a lot of thinking/ processing that she is going to be able to quit smoking without additional assistance; states, "I have done it once, so I know I can do it again."  She reports that she does not wish to participate in [place] program at this time.  I reiterated role/ services of [facility] CM program, and she politely declined, verbalizing no ongoing care coordination/ disease management/ pharmacy/ community resource needs.  I encouraged her to contact the [facility] CM office or myself directly should she change her mind, and also made her aware that [facility] Pharmacist embedded at PCP office remain currently involved in her care and would be contacting her again- encouraged her to take their calls and to discuss her participation with them, and she verbalized agreement and understanding.  Patient denies further issues, concerns, or problems today.  I provided/ confirmed that patient has my direct phone  number, the main [facility] CM office phone number, and the [facility] CM 24-hour nurse advice phone number should she change her mind and wish to participate in the [facility] CM program in the future.  Plan:  Will make patient's PCP and [facility] CSW and Pharmacy teams aware that patient has declined ongoing [facility] CM involvement in program, stating no current care coordination/ disease management/ pharmacy/ community resource needs  Donna Rack, RN, BSN, [employer] Coordinator [facility] Care Management  [phone]

## 2019-10-25 ENCOUNTER — Telehealth: Payer: Self-pay | Admitting: *Deleted

## 2019-10-25 DIAGNOSIS — Z122 Encounter for screening for malignant neoplasm of respiratory organs: Secondary | ICD-10-CM

## 2019-10-25 DIAGNOSIS — Z87891 Personal history of nicotine dependence: Secondary | ICD-10-CM

## 2019-10-25 NOTE — Telephone Encounter (Signed)
Contacted and scheduled. Current smoker, 47.25 pack year

## 2019-10-26 ENCOUNTER — Telehealth: Payer: Medicare Other

## 2019-10-26 NOTE — Progress Notes (Signed)
Cardiology Office Note  Date:  10/29/2019   ID:  Donna Rios, DOB 05/18/1950, MRN [id number]  PCP:  [name], MD   Chief Complaint  Patient presents with  . OTHER    12 month f/u no complaints today. Meds reviewed verbally with pt.    HPI:  Ms. Donna Rios is a 69 y.o. woman with past medical history of Hyperlipidemia Smoking, quit 6/20 Severe aortic valve stenosis, TAVR Venous insufficiency  Cath in 05/2018:no significant CAD Who presents to the office today for follow-up of her bicuspid AoV with severe AS s/p TAVR (07/11/18)  Echo 06/2019: EF normal, stable TAVR Results reviewed  Smoking again, small amt Active Neuropathy, arthritis in hands  Tolerating eliquis Paroxysmal atrial fib, rare palpitatiosn  Weight trending down  Brother  in [place],died  EKG personally reviewed by myself on todays visit NSR rate 72 bpm, no significnat ST or T wave changes  echo  07/11/18. TAVR with a48mm Edwards Sapien 3 THV via the TF approach on She had a brief run of Afib and was cardioverted back to sinus rhythm  zio did show atrial fib, paroxysmal  CHADS VASC probably 4   stop plavix and start eliquis 5 mg BID. Continue ASA 81 mg daily until 6 months out from TAVR then OK to use eliquis alone  Treadmill at home, but does not use it Quit smoking  Echo 07/2018 Aortic valve regurgitation is trivial . Mean gradient 10 mm Hg, peak gradient 24 mm Hg , estimated AVA 2.45 cm sq   echocardiogram 04/10/2018.   ejection fraction is normal (60 - 65%). The right side was also normal. Her aortic valve appears to have two leaflets rather than three. It had a valve area of 0.79 cm.  Markedly elevated mean and peak systolic pressure  Blood pressure 140/72 Total Chol 191/ LDL 93 CR 0.77 Glucose 106  EKG personally reviewed by myself on todays visit Shows normal sinus rhythm. 73 bpm. No ST ABn   OTHER PAST MEDICAL HISTORY REVIEWED BY ME FOR TODAY'S VISIT: Most recent  echocardiogram in March 2020 Normal ejection fraction, 60 - 65% Left ventricle normal Right ventricle normal Mean gradient 60.5 mmHg Peak gradient 96.4 mmHg Aortic valve is bicuspid rather than tricuspid Aortic valve area 0.79 cm  Echocardiogram May 2019 Normal ejection fraction Mean gradient 46 mmHg Peak gradient 73 mmHg Take velocity 432 cm per sec and Valve area less than 1 cm    PMH:   has a past medical history of Allergic rhinitis, Arthritis, Fatty liver, Hypertension, Phlebitis, S/P TAVR (transcatheter aortic valve replacement), Severe aortic stenosis, Stress incontinence, and Tobacco abuse.  Aortic valve stenosis Smoker  PSH:    Past Surgical History:  Procedure Laterality Date  . BREAST BIOPSY Left    ducts removed  . Cataract surgery    . COLONOSCOPY WITH PROPOFOL N/A 09/29/2017   Procedure: COLONOSCOPY WITH PROPOFOL;  Surgeon: Lin Landsman, MD;  Location: [facility] ENDOSCOPY;  Service: Gastroenterology;  Laterality: N/A;  . EYE SURGERY    . FOOT SURGERY     7  . INTRAOPERATIVE TRANSTHORACIC ECHOCARDIOGRAM N/A 07/11/2018   Procedure: Intraoperative Transthoracic Echocardiogram;  Surgeon: Sherren Mocha, MD;  Location: [place];  Service: Open Heart Surgery;  Laterality: N/A;  . KNEE ARTHROSCOPY Right   . LAPAROSCOPIC HYSTERECTOMY    . RIGHT HEART CATH AND CORONARY ANGIOGRAPHY N/A 06/15/2018   Procedure: RIGHT HEART CATH AND CORONARY ANGIOGRAPHY;  Surgeon: Sherren Mocha, MD;  Location: [name]  INVASIVE CV LAB;  Service: Cardiovascular;  Laterality: N/A;  . TRANSCATHETER AORTIC VALVE REPLACEMENT, TRANSFEMORAL  07/11/2018  . TRANSCATHETER AORTIC VALVE REPLACEMENT, TRANSFEMORAL N/A 07/11/2018   Procedure: TRANSCATHETER AORTIC VALVE REPLACEMENT, TRANSFEMORAL;  Surgeon: Sherren Mocha, MD;  Location: [place];  Service: Open Heart Surgery;  Laterality: N/A;  . VARICOSE VEIN SURGERY      Current Outpatient Medications  Medication Sig Dispense Refill  . acetaminophen (TYLENOL)  650 MG CR tablet Take 650 mg by mouth every 8 (eight) hours as needed for pain.     Marland Kitchen amoxicillin (AMOXIL) 500 MG tablet Take 4 capsules (2,000 mg) one hour prior to all dental visits. 8 tablet 12  . APPLE CIDER VINEGAR PO Take 1 tablet by mouth daily as needed (with fatty foods).     Marland Kitchen b complex vitamins tablet Take 1 tablet by mouth 2 (two) times a week.     . Calcium-Magnesium-Vitamin D (CALCIUM 1200+D3 PO) Take by mouth.    . cetirizine (ZYRTEC) 10 MG tablet Take 10 mg by mouth as needed.     Marland Kitchen ELIQUIS 5 MG TABS tablet TAKE 1 TABLET BY MOUTH TWICE A DAY 60 tablet 6  . fluticasone (FLONASE) 50 MCG/ACT nasal spray Place 1 spray into both nostrils daily as needed for allergies.     Marland Kitchen ketoconazole (NIZORAL) 2 % cream Apply 1 application topically daily. 15 g 0  . Lidocaine 4 % SOLN Apply topically as needed.    Marland Kitchen losartan (COZAAR) 25 MG tablet Take 1 tablet (25 mg total) by mouth daily. 90 tablet 3  . Menthol, Topical Analgesic, (ICY HOT EX) Apply 1 application topically 4 (four) times daily as needed (muscle pain).     . Multiple Vitamin (MULTIVITAMIN WITH MINERALS) TABS tablet Take 1 tablet by mouth daily. Women's Complete Multivitamin 50+    . Probiotic Product (PROBIOTIC ADVANCED PO) Take 1 tablet by mouth every other day.     Marland Kitchen Propylene Glycol (SYSTANE COMPLETE OP) Place 1 drop into both eyes 3 (three) times daily as needed (dry/irritated eyes.).     Marland Kitchen rosuvastatin (CRESTOR) 40 MG tablet TAKE 1 TABLET BY MOUTH EVERY DAY 90 tablet 2  . sertraline (ZOLOFT) 50 MG tablet Take 0.5 tablets (25 mg total) by mouth daily for 7 days, THEN 1 tablet (50 mg total) daily. 90 tablet 1  . sodium chloride (OCEAN) 0.65 % SOLN nasal spray Place 1 spray into both nostrils 4 (four) times daily as needed for congestion.      No current facility-administered medications for this visit.     Allergies:   Ibuprofen, Asa [aspirin], and Iohexol   Social History:  The patient  reports that she has been smoking  cigarettes. She has a 23.00 pack-year smoking history. She has never used smokeless tobacco. She reports previous alcohol use. She reports that she does not use drugs.   Family History:   family history includes Alcoholism in an other family member; Arthritis in an other family member; Diabetes in her mother and another family member; Heart disease in an other family member; Heart failure in her father and mother; Hypertension in her mother and another family member; Hypotension in her father; Lung cancer in an other family member; Stroke in an other family member.    Review of Systems: Review of Systems  Constitutional: Positive for malaise/fatigue.  Eyes: Negative.   Respiratory: Negative.   Cardiovascular: Negative.   Gastrointestinal: Negative.   Genitourinary: Negative.   Musculoskeletal: Negative.  Neurological: Negative.   Psychiatric/Behavioral: Negative.   All other systems reviewed and are negative.   PHYSICAL EXAM: VS:  BP 140/90 (BP Location: Left Arm, Patient Position: Sitting, Cuff Size: Normal)   Pulse 72   Ht 5\' 8"  (1.727 m)   Wt 203 lb 6 oz (92.3 kg)   SpO2 98%   BMI 30.92 kg/m  , BMI Body mass index is 30.92 kg/m. Constitutional:  oriented to person, place, and time. No distress.  HENT:  Head: Grossly normal Eyes:  no discharge. No scleral icterus.  Neck: No JVD, no carotid bruits  Cardiovascular: Regular rate and rhythm, no murmurs appreciated Pulmonary/Chest: Clear to auscultation bilaterally, no wheezes or rails Abdominal: Soft.  no distension.  no tenderness.  Musculoskeletal: Normal range of motion Neurological:  normal muscle tone. Coordination normal. No atrophy Skin: Skin warm and dry Psychiatric: normal affect, pleasant  Recent Labs: 09/18/2019: ALT 15; BUN 9; Creatinine, Ser 0.73; Potassium 4.2; Sodium 136 10/04/2019: Hemoglobin 13.0; Platelets 176.0    Lipid Panel Lab Results  Component Value Date   CHOL 153 09/18/2019   HDL 51.00  09/18/2019   LDLCALC 63 07/25/2018   TRIG 235.0 (H) 09/18/2019      Wt Readings from Last 3 Encounters:  10/29/19 203 lb 6 oz (92.3 kg)  09/18/19 209 lb 3.2 oz (94.9 kg)  07/25/19 212 lb 6.4 oz (96.3 kg)     ASSESSMENT AND PLAN:  Severe aortic valve stenosis S/p TAVR Echo 06/2019,  Followed in [place]  Paroxysmal atrial fibrillation Seen on event monitor,  eliquis 5 BID NSR today stable  Tobacco abuse We have encouraged her to continue to work on weaning her cigarettes and smoking cessation. She will continue to work on this and does not want any assistance with chantix.   Prediabetes We have encouraged continued exercise, careful diet management in an effort to lose weight.HAB1C 6.1  Mixed hyperlipidemia Continue Crestor LDL is 71   Total encounter time more than 25 minutes  Greater than 50% was spent in counseling and coordination of care with the patient    Orders Placed This Encounter  Procedures  . EKG 12-Lead     Signed, Esmond Plants, M.D., Ph.D. 10/29/2019  [place], [place]

## 2019-10-29 ENCOUNTER — Encounter: Payer: Self-pay | Admitting: Cardiovascular Disease

## 2019-10-29 ENCOUNTER — Other Ambulatory Visit: Payer: Self-pay

## 2019-10-29 ENCOUNTER — Ambulatory Visit (INDEPENDENT_AMBULATORY_CARE_PROVIDER_SITE_OTHER): Payer: Medicare Other | Admitting: Cardiovascular Disease

## 2019-10-29 VITALS — BP 140/90 | HR 72 | Ht 68.0 in | Wt 203.4 lb

## 2019-10-29 DIAGNOSIS — Z952 Presence of prosthetic heart valve: Secondary | ICD-10-CM | POA: Diagnosis not present

## 2019-10-29 DIAGNOSIS — I480 Paroxysmal atrial fibrillation: Secondary | ICD-10-CM

## 2019-10-29 DIAGNOSIS — I10 Essential (primary) hypertension: Secondary | ICD-10-CM | POA: Diagnosis not present

## 2019-10-29 DIAGNOSIS — I2510 Atherosclerotic heart disease of native coronary artery without angina pectoris: Secondary | ICD-10-CM

## 2019-10-29 DIAGNOSIS — I350 Nonrheumatic aortic (valve) stenosis: Secondary | ICD-10-CM | POA: Diagnosis not present

## 2019-10-29 DIAGNOSIS — Z720 Tobacco use: Secondary | ICD-10-CM | POA: Diagnosis not present

## 2019-10-29 DIAGNOSIS — E782 Mixed hyperlipidemia: Secondary | ICD-10-CM

## 2019-10-29 NOTE — Patient Instructions (Addendum)
Medication Instructions:  No changes  If you need a refill on your cardiac medications before your next appointment, please call your pharmacy.    Lab work: No new labs needed   If you have labs (blood work) drawn today and your tests are completely normal, you will receive your results only by: . MyChart Message (if you have MyChart) OR . A paper copy in the mail If you have any lab test that is abnormal or we need to change your treatment, we will call you to review the results.   Testing/Procedures: No new testing needed   Follow-Up: At [facility], you and your health needs are our priority.  As part of our continuing mission to provide you with exceptional heart care, we have created designated Provider Care Teams.  These Care Teams include your primary Cardiologist (physician) and Advanced Practice Providers (APPs -  Physician Assistants and Nurse Practitioners) who all work together to provide you with the care you need, when you need it.  . You will need a follow up appointment in 6 months  . Providers on your designated Care Team:   . Christopher Berge, NP . Ryan Dunn, PA-C . Jacquelyn Visser, PA-C  Any Other Special Instructions Will Be Listed Below (If Applicable).  COVID-19 Vaccine Information can be found at: [email or web address] For questions related to vaccine distribution or appointments, please email [email or web address] or call [phone].     

## 2019-11-01 ENCOUNTER — Ambulatory Visit
Admission: RE | Admit: 2019-11-01 | Discharge: 2019-11-01 | Disposition: A | Discharge status: Home / Self Care | Payer: Medicare Other | Source: Ambulatory Visit | Attending: Nurse Practitioner | Admitting: Nurse Practitioner

## 2019-11-01 ENCOUNTER — Other Ambulatory Visit: Payer: Self-pay

## 2019-11-01 DIAGNOSIS — Z122 Encounter for screening for malignant neoplasm of respiratory organs: Secondary | ICD-10-CM | POA: Diagnosis not present

## 2019-11-01 DIAGNOSIS — F17210 Nicotine dependence, cigarettes, uncomplicated: Secondary | ICD-10-CM | POA: Diagnosis not present

## 2019-11-01 DIAGNOSIS — Z87891 Personal history of nicotine dependence: Secondary | ICD-10-CM | POA: Diagnosis not present

## 2019-11-06 ENCOUNTER — Other Ambulatory Visit: Payer: Self-pay | Admitting: *Deleted

## 2019-11-06 ENCOUNTER — Encounter: Payer: Self-pay | Admitting: *Deleted

## 2019-11-06 NOTE — Patient Outreach (Signed)
[facility]) Care Management  11/06/2019  BRYNLI OLLIS [birth year] [id number]   [place] spoke with pt who reports she is doing "better". She feels the RX for her depression/anxiety are making a difference. "I didn't go into a spiral while having a lot of visitors in town".  Per pt, she had a family reunion with lots of family in town and had company at her house. She felt her anxiety was well managed during this time.  She does admit to "still smoking" but trying to cut back.  Pt sees the smoking as a sort of coping mechanism.  CSW again discussed counseling as an option to which she declines.  Pt also feels the "book on grief I got from the church" has been helpful with her bereavement.  CSW offered support and reminded pt to call if needs arise before follow up call from [place].   Eduard Clos, MSW, [place] Worker  [place] [phone]

## 2019-11-08 ENCOUNTER — Ambulatory Visit
Admission: RE | Admit: 2019-11-08 | Discharge: 2019-11-08 | Disposition: A | Discharge status: Home / Self Care | Payer: Medicare Other | Source: Ambulatory Visit | Attending: Family Medicine | Admitting: Family Medicine

## 2019-11-08 ENCOUNTER — Other Ambulatory Visit: Payer: Self-pay

## 2019-11-08 DIAGNOSIS — R29890 Loss of height: Secondary | ICD-10-CM | POA: Diagnosis not present

## 2019-11-08 DIAGNOSIS — Z780 Asymptomatic menopausal state: Secondary | ICD-10-CM | POA: Diagnosis not present

## 2019-11-08 DIAGNOSIS — Z1231 Encounter for screening mammogram for malignant neoplasm of breast: Secondary | ICD-10-CM

## 2019-11-08 DIAGNOSIS — Z0389 Encounter for observation for other suspected diseases and conditions ruled out: Secondary | ICD-10-CM | POA: Diagnosis not present

## 2019-11-08 DIAGNOSIS — M8589 Other specified disorders of bone density and structure, multiple sites: Secondary | ICD-10-CM | POA: Diagnosis not present

## 2019-11-08 DIAGNOSIS — R634 Abnormal weight loss: Secondary | ICD-10-CM | POA: Diagnosis not present

## 2019-11-23 ENCOUNTER — Other Ambulatory Visit: Payer: Self-pay | Admitting: Cardiovascular Disease

## 2019-12-12 ENCOUNTER — Other Ambulatory Visit: Payer: Self-pay | Admitting: *Deleted

## 2019-12-12 NOTE — Patient Outreach (Signed)
[facility]) Care Management  12/12/2019  Donna Rios 1950-09-24 [id number]   [place] spoke with pt by phone today who reports she is doing "pretty good".  Per pt, she was able to attend a memorial service for her brother at the college he had previously attended. She reports "coping well" and focusing on the positives; happy memories, etc.  Pt also shared how she has been exercising, being more social and doing things she enjoys.  CSW encouraged pt to continue to reflect and seek support as needed. She feels she has good support and is equipped.   CSW will sign off at this time and advise PCP and [facility] team of above.   Eduard Clos, MSW, [place] Worker  [place] [phone]

## 2019-12-19 ENCOUNTER — Encounter: Payer: Self-pay | Admitting: Family Medicine

## 2019-12-19 ENCOUNTER — Ambulatory Visit (INDEPENDENT_AMBULATORY_CARE_PROVIDER_SITE_OTHER): Payer: Medicare Other | Admitting: Family Medicine

## 2019-12-19 ENCOUNTER — Other Ambulatory Visit: Payer: Self-pay

## 2019-12-19 DIAGNOSIS — I10 Essential (primary) hypertension: Secondary | ICD-10-CM

## 2019-12-19 DIAGNOSIS — F419 Anxiety disorder, unspecified: Secondary | ICD-10-CM

## 2019-12-19 DIAGNOSIS — M5442 Lumbago with sciatica, left side: Secondary | ICD-10-CM

## 2019-12-19 DIAGNOSIS — M545 Low back pain, unspecified: Secondary | ICD-10-CM | POA: Insufficient documentation

## 2019-12-19 DIAGNOSIS — R202 Paresthesia of skin: Secondary | ICD-10-CM | POA: Diagnosis not present

## 2019-12-19 DIAGNOSIS — M25552 Pain in left hip: Secondary | ICD-10-CM

## 2019-12-19 DIAGNOSIS — G8929 Other chronic pain: Secondary | ICD-10-CM | POA: Insufficient documentation

## 2019-12-19 MED ORDER — LOSARTAN POTASSIUM 25 MG PO TABS: 50.0000 mg | ORAL_TABLET | Freq: Every day | ORAL | 1 refills | Status: DC

## 2019-12-19 MED ORDER — SERTRALINE HCL 25 MG PO TABS: 25.0000 mg | ORAL_TABLET | Freq: Every day | ORAL | 1 refills | Status: DC

## 2019-12-19 MED ORDER — APIXABAN 5 MG PO TABS
5.0000 mg | ORAL_TABLET | Freq: Two times a day (BID) | ORAL | 1 refills | Status: DC
Start: 2019-12-19 — End: 2020-06-10

## 2019-12-19 NOTE — Assessment & Plan Note (Signed)
Occasional sciatica with this.  Offered physical therapy referral though she declined.  She will monitor and remain active.

## 2019-12-19 NOTE — Assessment & Plan Note (Signed)
Possible neuropathy.  Distribution is not consistent with carpal tunnel.  We will check a B12 and A1c with her lab work.

## 2019-12-19 NOTE — Progress Notes (Signed)
[name], MD Phone: [phone]  Donna Rios is a 69 y.o. female who presents today for f/u.  HYPERTENSION  Disease Monitoring  Home BP Monitoring 100s-140s/60s-80s, about 50% time above 130/80 Chest pain- no    Dyspnea- no Medications  Compliance-  Taking losartan 25 mg daily.  Tobacco abuse: She is down to half a pack per day.  She is working on cutting down on her own.  Bilateral finger tingling: This occurs at times when she pets her cats.  This has been going on for at least a few months.  Has a history of carpal tunnel.  Anxiety: Patient notes this is controlled on Zoloft 25 mg daily.  No depression.  She notes doing breathing exercises does help with her anxiety.  Left hip/left low back pain: Chronic ongoing intermittent issues with this.  Notes her left low back will grab at times and she will have sciatica pain down the back of her left leg.  No numbness or weakness.  She has been walking with a cane due to the intermittent pain.  She requests a handicap placard.    Social History   Tobacco Use  Smoking Status Current Some Day Smoker  . Packs/day: 0.50  . Years: 46.00  . Pack years: 23.00  . Types: Cigarettes  Smokeless Tobacco Never Used  Tobacco Comment   stopped 06/11/2018...smokes 1-10 per day     ROS see history of present illness  Objective  Physical Exam Vitals:   12/19/19 0905  BP: 130/80  Pulse: 81  Temp: 98.4 F (36.9 C)  SpO2: 98%    BP Readings from Last 3 Encounters:  12/19/19 130/80  10/29/19 140/90  09/18/19 140/84   Wt Readings from Last 3 Encounters:  12/19/19 199 lb 12.8 oz (90.6 kg)  11/01/19 203 lb (92.1 kg)  10/29/19 203 lb 6 oz (92.3 kg)    Physical Exam Constitutional:      General: She is not in acute distress.    Appearance: She is not diaphoretic.  Cardiovascular:     Rate and Rhythm: Normal rate and regular rhythm.     Heart sounds: Normal heart sounds.  Pulmonary:     Effort: Pulmonary effort is  normal.     Breath sounds: Normal breath sounds.  Musculoskeletal:     Comments: No midline spine tenderness, no midline spine step-off, some mild left lumbar muscular back tenderness  Skin:    General: Skin is warm and dry.  Neurological:     Mental Status: She is alert.     Comments: Sensation to light touch intact bilateral hands and lower extremities, 5/5 strength bilateral quads, hamstrings, plantar flexion, and dorsiflexion      Assessment/Plan: Please see individual problem list.  Problem List Items Addressed This Visit    Anxiety    Well-controlled.  Continue Zoloft 25 mg daily.      Relevant Medications   sertraline (ZOLOFT) 25 MG tablet   Chronic left hip pain    Chronic issue.  Prior x-ray with moderate arthritis.  She can continue to use her cane.  Handicap placard form filled out.      Relevant Medications   sertraline (ZOLOFT) 25 MG tablet   Chronic low back pain    Occasional sciatica with this.  Offered physical therapy referral though she declined.  She will monitor and remain active.      Hand tingling    Possible neuropathy.  Distribution is not consistent with carpal tunnel.  We  will check a B12 and A1c with her lab work.      Relevant Orders   HgB A1c   B12   Hypertension    Above goal.  She will increase losartan to 50 mg once daily.  Follow-up in 1 week for lab work and BP check.  Discussed if she develops lightheadedness with increasing this dose she can go back to the 25 mg once daily dosing.      Relevant Medications   apixaban (ELIQUIS) 5 MG TABS tablet   losartan (COZAAR) 25 MG tablet   Other Relevant Orders   Basic Metabolic Panel (BMET)       This visit occurred during the SARS-CoV-2 public health emergency.  Safety protocols were in place, including screening questions prior to the visit, additional usage of staff PPE, and extensive cleaning of exam room while observing appropriate contact time as indicated for disinfecting solutions.     [name], MD [place]

## 2019-12-19 NOTE — Assessment & Plan Note (Signed)
Above goal.  She will increase losartan to 50 mg once daily.  Follow-up in 1 week for lab work and BP check.  Discussed if she develops lightheadedness with increasing this dose she can go back to the 25 mg once daily dosing.

## 2019-12-19 NOTE — Patient Instructions (Signed)
Nice to see you. We will increase your losartan to 50 mg once daily.  If you develop lightheadedness with this please go back to the 25 mg dose.  We will have you return for labs in 1 week.

## 2019-12-19 NOTE — Assessment & Plan Note (Signed)
Chronic issue.  Prior x-ray with moderate arthritis.  She can continue to use her cane.  Handicap placard form filled out.

## 2019-12-19 NOTE — Assessment & Plan Note (Signed)
Well-controlled.  Continue Zoloft 25 mg daily. 

## 2019-12-26 ENCOUNTER — Other Ambulatory Visit: Payer: Self-pay

## 2019-12-26 ENCOUNTER — Ambulatory Visit (INDEPENDENT_AMBULATORY_CARE_PROVIDER_SITE_OTHER): Payer: Medicare Other

## 2019-12-26 DIAGNOSIS — I10 Essential (primary) hypertension: Secondary | ICD-10-CM | POA: Diagnosis not present

## 2019-12-26 DIAGNOSIS — R030 Elevated blood-pressure reading, without diagnosis of hypertension: Secondary | ICD-10-CM | POA: Diagnosis not present

## 2019-12-26 DIAGNOSIS — R202 Paresthesia of skin: Secondary | ICD-10-CM | POA: Diagnosis not present

## 2019-12-26 NOTE — Progress Notes (Signed)
Patient is here for a BP check due to bp being high at last visit, as per patient.  Currently patients BP is 150/98 and BPM is 83. BP was retaken after waiting 10 minutes in office and BP was 132/90 and BPM was 79.  Patient has no complaints of headaches, blurry vision, chest pain, arm pain, light headedness, dizziness, and nor jaw pain. Please see previous note for order.

## 2019-12-27 LAB — BASIC METABOLIC PANEL
BUN: 13 mg/dL (ref 6–23)
CO2: 28 mEq/L (ref 19–32)
Calcium: 9.2 mg/dL (ref 8.4–10.5)
Chloride: 105 mEq/L (ref 96–112)
Creatinine, Ser: 0.7 mg/dL (ref 0.40–1.20)
GFR: 88.29 mL/min (ref >60.00)
Glucose, Bld: 100 mg/dL — ABNORMAL HIGH (ref 70–99)
Potassium: 4 mEq/L (ref 3.5–5.1)
Sodium: 141 mEq/L (ref 135–145)

## 2019-12-27 LAB — HEMOGLOBIN A1C: Hgb A1c MFr Bld: 6 % (ref 4.6–6.5)

## 2019-12-27 LAB — VITAMIN B12: Vitamin B-12: 362 pg/mL (ref 211–911)

## 2020-02-22 ENCOUNTER — Other Ambulatory Visit: Payer: Self-pay | Admitting: Family Medicine

## 2020-03-19 ENCOUNTER — Other Ambulatory Visit: Payer: Self-pay

## 2020-03-21 ENCOUNTER — Other Ambulatory Visit: Payer: Self-pay

## 2020-03-21 ENCOUNTER — Ambulatory Visit: Payer: Medicare PPO | Admitting: Family Medicine

## 2020-03-21 DIAGNOSIS — F419 Anxiety disorder, unspecified: Secondary | ICD-10-CM

## 2020-03-21 DIAGNOSIS — M5442 Lumbago with sciatica, left side: Secondary | ICD-10-CM | POA: Diagnosis not present

## 2020-03-21 DIAGNOSIS — G8929 Other chronic pain: Secondary | ICD-10-CM

## 2020-03-21 DIAGNOSIS — I700 Atherosclerosis of aorta: Secondary | ICD-10-CM

## 2020-03-21 DIAGNOSIS — R202 Paresthesia of skin: Secondary | ICD-10-CM | POA: Diagnosis not present

## 2020-03-21 DIAGNOSIS — F17210 Nicotine dependence, cigarettes, uncomplicated: Secondary | ICD-10-CM

## 2020-03-21 MED ORDER — PREDNISONE 20 MG PO TABS: 40.0000 mg | ORAL_TABLET | Freq: Every day | ORAL | 0 refills | Status: DC

## 2020-03-21 NOTE — Progress Notes (Signed)
[name], MD Phone: [phone]  Donna Rios is a 70 y.o. female who presents today for f/u.  HYPERLIPIDEMIA Symptoms Chest pain on exertion:  no    Medications: Compliance- taking crestor Right upper quadrant pain- no  Muscle aches- no  Chronic low back pain: This is on the left side.  She notes progressive issues with pain radiating from her back down the side of her leg and then into the front of her leg down to her feet.  It is very uncomfortable.  Gets back spasms.  Notes this has been going on since last year when she had a fall.  She reports she saw orthopedics at that time and had imaging of her back.  She notes no numbness.  No weakness.  No incontinence.  Anxiety: This is well controlled.  She is no longer on Zoloft.  She knows she was getting distracted taking the Zoloft.  Nicotine dependence: She is thinking about quitting.  She has nicotine patches on hand to start.  Hand tingling: This has been an ongoing issue.  She previously deferred seeing neurology though she is open to this now.    Social History   Tobacco Use  Smoking Status Current Some Day Smoker   Packs/day: 0.50   Years: 46.00   Pack years: 23.00   Types: Cigarettes  Smokeless Tobacco Never Used  Tobacco Comment   stopped 06/11/2018...smokes 1-10 per day    Current Outpatient Medications on File Prior to Visit  Medication Sig Dispense Refill   acetaminophen (TYLENOL) 650 MG CR tablet Take 650 mg by mouth every 8 (eight) hours as needed for pain.      amoxicillin (AMOXIL) 500 MG tablet Take 4 capsules (2,000 mg) one hour prior to all dental visits. 8 tablet 12   apixaban (ELIQUIS) 5 MG TABS tablet Take 1 tablet (5 mg total) by mouth 2 (two) times daily. 180 tablet 1   APPLE CIDER VINEGAR PO Take 1 tablet by mouth daily as needed (with fatty foods).      b complex vitamins tablet Take 1 tablet by mouth 2 (two) times a week.      Calcium-Magnesium-Vitamin D (CALCIUM 1200+D3 PO) Take  by mouth.     cetirizine (ZYRTEC) 10 MG tablet Take 10 mg by mouth as needed.      fluticasone (FLONASE) 50 MCG/ACT nasal spray Place 1 spray into both nostrils daily as needed for allergies.      ketoconazole (NIZORAL) 2 % cream Apply 1 application topically daily. 15 g 0   Lidocaine 4 % SOLN Apply topically as needed.     losartan (COZAAR) 25 MG tablet Take 2 tablets (50 mg total) by mouth daily. 180 tablet 1   Menthol, Topical Analgesic, (ICY HOT EX) Apply 1 application topically 4 (four) times daily as needed (muscle pain).      Multiple Vitamin (MULTIVITAMIN WITH MINERALS) TABS tablet Take 1 tablet by mouth daily. Women's Complete Multivitamin 50+     PNEUMOVAX 23 25 MCG/0.5ML injection      Probiotic Product (PROBIOTIC ADVANCED PO) Take 1 tablet by mouth every other day.     Propylene Glycol (SYSTANE COMPLETE OP) Place 1 drop into both eyes 3 (three) times daily as needed (dry/irritated eyes.).      rosuvastatin (CRESTOR) 40 MG tablet TAKE 1 TABLET BY MOUTH EVERY DAY 90 tablet 2   sertraline (ZOLOFT) 25 MG tablet Take 1 tablet (25 mg total) by mouth daily. 90 tablet 1   sertraline (  ZOLOFT) 50 MG tablet TAKE 1/2 TABLETS (25 MG TOTAL) BY MOUTH DAILY FOR 7 DAYS, THEN 1 TABLET (50 MG TOTAL) DAILY. 90 tablet 1   sodium chloride (OCEAN) 0.65 % SOLN nasal spray Place 1 spray into both nostrils 4 (four) times daily as needed for congestion.      No current facility-administered medications on file prior to visit.     ROS see history of present illness  Objective  Physical Exam Vitals:   03/21/20 1052  BP: 120/80  Pulse: 81  Temp: 98.7 F (37.1 C)  SpO2: 99%    BP Readings from Last 3 Encounters:  03/21/20 120/80  12/26/19 132/90  12/19/19 130/80   Wt Readings from Last 3 Encounters:  03/21/20 200 lb 3.2 oz (90.8 kg)  12/19/19 199 lb 12.8 oz (90.6 kg)  11/01/19 203 lb (92.1 kg)    Physical Exam Constitutional:      General: She is not in acute distress.     Appearance: She is not diaphoretic.  Cardiovascular:     Rate and Rhythm: Normal rate and regular rhythm.     Heart sounds: Normal heart sounds.  Pulmonary:     Effort: Pulmonary effort is normal.     Breath sounds: Normal breath sounds.  Musculoskeletal:        General: No edema.     Comments: No midline spine tenderness, no midline spine step-off, no muscular back tenderness  Skin:    General: Skin is warm and dry.  Neurological:     Mental Status: She is alert.     Comments: 5/5 strength in bilateral biceps, triceps, grip, quads, hamstrings, plantar and dorsiflexion, sensation to light touch intact in bilateral UE and LE, 2+ patellar reflexes      Assessment/Plan: Please see individual problem list.  Problem List Items Addressed This Visit    Anxiety    Resolved.  She will monitor for recurrence of symptoms off of Zoloft.      Aortic atherosclerosis ([place])    Continue risk factor management.      Chronic low back pain    Chronic issue with worsening sciatica-like discomfort.  We will trial a short course of prednisone.  If not improving she may need to follow-up with orthopedics.      Relevant Medications   predniSONE (DELTASONE) 20 MG tablet   Hand tingling    Ongoing issues with this.  We will refer to neurology.      Relevant Orders   Ambulatory referral to Neurology   Nicotine dependence, cigarettes, uncomplicated    Encourage smoking cessation.  Patient will try nicotine patches when she decides to quit.         This visit occurred during the SARS-CoV-2 public health emergency.  Safety protocols were in place, including screening questions prior to the visit, additional usage of staff PPE, and extensive cleaning of exam room while observing appropriate contact time as indicated for disinfecting solutions.    [name], MD [place]

## 2020-03-21 NOTE — Assessment & Plan Note (Signed)
Resolved.  She will monitor for recurrence of symptoms off of Zoloft.

## 2020-03-21 NOTE — Assessment & Plan Note (Signed)
Ongoing issues with this.  We will refer to neurology.

## 2020-03-21 NOTE — Assessment & Plan Note (Signed)
Encourage smoking cessation.  Patient will try nicotine patches when she decides to quit.

## 2020-03-21 NOTE — Assessment & Plan Note (Signed)
Continue risk factor management. 

## 2020-03-21 NOTE — Patient Instructions (Signed)
Nice to see you. Please quit smoking. Please let us know if the prednisone is not helpful.

## 2020-03-21 NOTE — Assessment & Plan Note (Signed)
Chronic issue with worsening sciatica-like discomfort.  We will trial a short course of prednisone.  If not improving she may need to follow-up with orthopedics.

## 2020-04-02 ENCOUNTER — Ambulatory Visit: Payer: Medicare PPO | Admitting: Family Medicine

## 2020-04-02 ENCOUNTER — Encounter: Payer: Self-pay | Admitting: Family Medicine

## 2020-04-02 ENCOUNTER — Other Ambulatory Visit: Payer: Self-pay

## 2020-04-02 DIAGNOSIS — R000 Tachycardia, unspecified: Secondary | ICD-10-CM | POA: Insufficient documentation

## 2020-04-02 DIAGNOSIS — M5442 Lumbago with sciatica, left side: Secondary | ICD-10-CM

## 2020-04-02 DIAGNOSIS — G8929 Other chronic pain: Secondary | ICD-10-CM

## 2020-04-02 DIAGNOSIS — I10 Essential (primary) hypertension: Secondary | ICD-10-CM | POA: Diagnosis not present

## 2020-04-02 NOTE — Patient Instructions (Signed)
Nice to see you. Please complete the exercises for your back. If your elevated heart rate returns please let us know.   Sciatica Rehab Ask your health care provider which exercises are safe for you. Do exercises exactly as told by your health care provider and adjust them as directed. It is normal to feel mild stretching, pulling, tightness, or discomfort as you do these exercises. Stop right away if you feel sudden pain or your pain gets worse. Do not begin these exercises until told by your health care provider. Stretching and range-of-motion exercises These exercises warm up your muscles and joints and improve the movement and flexibility of your hips and back. These exercises also help to relieve pain, numbness, and tingling. Sciatic nerve glide 1. Sit in a chair with your head facing down toward your chest. Place your hands behind your back. Let your shoulders slump forward. 2. Slowly straighten one of your legs while you tilt your head back as if you are looking toward the ceiling. Only straighten your leg as far as you can without making your symptoms worse. 3. Hold this position for __________ seconds. 4. Slowly return to the starting position. 5. Repeat with your other leg. Repeat __________ times. Complete this exercise __________ times a day. Knee to chest with hip adduction and internal rotation 1. Lie on your back on a firm surface with both legs straight. 2. Bend one of your knees and move it up toward your chest until you feel a gentle stretch in your lower back and buttock. Then, move your knee toward the shoulder that is on the opposite side from your leg. This is hip adduction and internal rotation. ? Hold your leg in this position by holding on to the front of your knee. 3. Hold this position for __________ seconds. 4. Slowly return to the starting position. 5. Repeat with your other leg. Repeat __________ times. Complete this exercise __________ times a day.   Prone  extension on elbows 1. Lie on your abdomen on a firm surface. A bed may be too soft for this exercise. 2. Prop yourself up on your elbows. 3. Use your arms to help lift your chest up until you feel a gentle stretch in your abdomen and your lower back. ? This will place some of your body weight on your elbows. If this is uncomfortable, try stacking pillows under your chest. ? Your hips should stay down, against the surface that you are lying on. Keep your hip and back muscles relaxed. 4. Hold this position for __________ seconds. 5. Slowly relax your upper body and return to the starting position. Repeat __________ times. Complete this exercise __________ times a day.   Strengthening exercises These exercises build strength and endurance in your back. Endurance is the ability to use your muscles for a long time, even after they get tired. Pelvic tilt This exercise strengthens the muscles that lie deep in the abdomen. 1. Lie on your back on a firm surface. Bend your knees and keep your feet flat on the floor. 2. Tense your abdominal muscles. Tip your pelvis up toward the ceiling and flatten your lower back into the floor. ? To help with this exercise, you may place a small towel under your lower back and try to push your back into the towel. 3. Hold this position for __________ seconds. 4. Let your muscles relax completely before you repeat this exercise. Repeat __________ times. Complete this exercise __________ times a day. Alternating arm and leg  raises 1. Get on your hands and knees on a firm surface. If you are on a hard floor, you may want to use padding, such as an exercise mat, to cushion your knees. 2. Line up your arms and legs. Your hands should be directly below your shoulders, and your knees should be directly below your hips. 3. Lift your left leg behind you. At the same time, raise your right arm and straighten it in front of you. ? Do not lift your leg higher than your hip. ? Do  not lift your arm higher than your shoulder. ? Keep your abdominal and back muscles tight. ? Keep your hips facing the ground. ? Do not arch your back. ? Keep your balance carefully, and do not hold your breath. 4. Hold this position for __________ seconds. 5. Slowly return to the starting position. 6. Repeat with your right leg and your left arm. Repeat __________ times. Complete this exercise __________ times a day.   Posture and body mechanics Good posture and healthy body mechanics can help to relieve stress in your body's tissues and joints. Body mechanics refers to the movements and positions of your body while you do your daily activities. Posture is part of body mechanics. Good posture means:  Your spine is in its natural S-curve position (neutral).  Your shoulders are pulled back slightly.  Your head is not tipped forward. Follow these guidelines to improve your posture and body mechanics in your everyday activities. Standing  When standing, keep your spine neutral and your feet about hip width apart. Keep a slight bend in your knees. Your ears, shoulders, and hips should line up.  When you do a task in which you stand in one place for a long time, place one foot up on a stable object that is 2-4 inches (5-10 cm) high, such as a footstool. This helps keep your spine neutral.   Sitting  When sitting, keep your spine neutral and keep your feet flat on the floor. Use a footrest, if necessary, and keep your thighs parallel to the floor. Avoid rounding your shoulders, and avoid tilting your head forward.  When working at a desk or a computer, keep your desk at a height where your hands are slightly lower than your elbows. Slide your chair under your desk so you are close enough to maintain good posture.  When working at a computer, place your monitor at a height where you are looking straight ahead and you do not have to tilt your head forward or downward to look at the screen.    Resting  When lying down and resting, avoid positions that are most painful for you.  If you have pain with activities such as sitting, bending, stooping, or squatting, lie in a position in which your body does not bend very much. For example, avoid curling up on your side with your arms and knees near your chest (fetal position).  If you have pain with activities such as standing for a long time or reaching with your arms, lie with your spine in a neutral position and bend your knees slightly. Try the following positions: ? Lying on your side with a pillow between your knees. ? Lying on your back with a pillow under your knees. Lifting  When lifting objects, keep your feet at least shoulder width apart and tighten your abdominal muscles.  Bend your knees and hips and keep your spine neutral. It is important to lift using the strength of  your legs, not your back. Do not lock your knees straight out.  Always ask for help to lift heavy or awkward objects.   This information is not intended to replace advice given to you by your health care provider. Make sure you discuss any questions you have with your health care provider. Document Revised: 05/05/2018 Document Reviewed: 02/02/2018 Elsevier Patient Education  [place].

## 2020-04-02 NOTE — Assessment & Plan Note (Signed)
This occurred at home following the use of prednisone.  I suspect this was related to her prednisone intake.  Has not recurred over the last several days.  Discussed monitoring and if it does recur she will let us know for further evaluation.

## 2020-04-02 NOTE — Progress Notes (Signed)
[name], MD Phone: [phone]  Donna Rios is a 70 y.o. female who presents today for follow-up.  Low back pain: She notes the prednisone was not beneficial.  No numbness.  No weakness.  No incontinence.  She does note radiation of the pain over her lateral hip and into her anterior thigh and down her leg.  Occasionally feels as though is going to spasm if she moves the wrong way.  She is not interested in physical therapy.  Elevated heart rate: Patient notes the day after stopping the prednisone she noted her heart rate went up into the 120s to 150s at times.  She noted no chest pain or shortness of breath.  This lasted a couple of days and has resolved at this time.  Social History   Tobacco Use  Smoking Status Current Some Day Smoker  . Packs/day: 0.50  . Years: 46.00  . Pack years: 23.00  . Types: Cigarettes  Smokeless Tobacco Never Used  Tobacco Comment   stopped 06/11/2018...smokes 1-10 per day    Current Outpatient Medications on File Prior to Visit  Medication Sig Dispense Refill  . acetaminophen (TYLENOL) 650 MG CR tablet Take 650 mg by mouth every 8 (eight) hours as needed for pain.     Marland Kitchen amoxicillin (AMOXIL) 500 MG tablet Take 4 capsules (2,000 mg) one hour prior to all dental visits. 8 tablet 12  . apixaban (ELIQUIS) 5 MG TABS tablet Take 1 tablet (5 mg total) by mouth 2 (two) times daily. 180 tablet 1  . APPLE CIDER VINEGAR PO Take 1 tablet by mouth daily as needed (with fatty foods).     Marland Kitchen b complex vitamins tablet Take 1 tablet by mouth 2 (two) times a week.     . Calcium-Magnesium-Vitamin D (CALCIUM 1200+D3 PO) Take by mouth.    . cetirizine (ZYRTEC) 10 MG tablet Take 10 mg by mouth as needed.     . fluticasone (FLONASE) 50 MCG/ACT nasal spray Place 1 spray into both nostrils daily as needed for allergies.     Marland Kitchen ketoconazole (NIZORAL) 2 % cream Apply 1 application topically daily. 15 g 0  . Lidocaine 4 % SOLN Apply topically as needed.    Marland Kitchen losartan  (COZAAR) 25 MG tablet Take 2 tablets (50 mg total) by mouth daily. 180 tablet 1  . Menthol, Topical Analgesic, (ICY HOT EX) Apply 1 application topically 4 (four) times daily as needed (muscle pain).     . Multiple Vitamin (MULTIVITAMIN WITH MINERALS) TABS tablet Take 1 tablet by mouth daily. Women's Complete Multivitamin 50+    . PNEUMOVAX 23 25 MCG/0.5ML injection     . predniSONE (DELTASONE) 20 MG tablet Take 2 tablets (40 mg total) by mouth daily with breakfast. 10 tablet 0  . Probiotic Product (PROBIOTIC ADVANCED PO) Take 1 tablet by mouth every other day.    Marland Kitchen Propylene Glycol (SYSTANE COMPLETE OP) Place 1 drop into both eyes 3 (three) times daily as needed (dry/irritated eyes.).     Marland Kitchen rosuvastatin (CRESTOR) 40 MG tablet TAKE 1 TABLET BY MOUTH EVERY DAY 90 tablet 2  . sertraline (ZOLOFT) 25 MG tablet Take 1 tablet (25 mg total) by mouth daily. 90 tablet 1  . sertraline (ZOLOFT) 50 MG tablet TAKE 1/2 TABLETS (25 MG TOTAL) BY MOUTH DAILY FOR 7 DAYS, THEN 1 TABLET (50 MG TOTAL) DAILY. 90 tablet 1  . sodium chloride (OCEAN) 0.65 % SOLN nasal spray Place 1 spray into both nostrils 4 (four) times  daily as needed for congestion.      No current facility-administered medications on file prior to visit.     ROS see history of present illness  Objective  Physical Exam Vitals:   04/02/20 0950  BP: 120/85  Pulse: 86  Temp: 98.9 F (37.2 C)  SpO2: 99%    BP Readings from Last 3 Encounters:  04/02/20 120/85  03/21/20 120/80  12/26/19 132/90   Wt Readings from Last 3 Encounters:  04/02/20 197 lb 9.6 oz (89.6 kg)  03/21/20 200 lb 3.2 oz (90.8 kg)  12/19/19 199 lb 12.8 oz (90.6 kg)    Physical Exam Constitutional:      General: She is not in acute distress.    Appearance: She is not diaphoretic.  Cardiovascular:     Rate and Rhythm: Normal rate and regular rhythm.     Heart sounds: Normal heart sounds.  Pulmonary:     Effort: Pulmonary effort is normal.     Breath sounds:  Normal breath sounds.  Musculoskeletal:        General: No edema.     Comments: No midline spine tenderness, no midline spine step-off, no muscular back tenderness  Skin:    General: Skin is warm and dry.  Neurological:     Mental Status: She is alert.     Comments: 5/5 strength bilateral quads, hamstrings, plantar flexion, and dorsiflexion, sensation light touch intact bilateral lower extremities, 2+ patellar reflexes      Assessment/Plan: Please see individual problem list.  Problem List Items Addressed This Visit    Chronic low back pain    This is a persistent issue.  We will refer back to orthopedics for further evaluation.  She was given exercises to complete at home.      Elevated pulse rate    This occurred at home following the use of prednisone.  I suspect this was related to her prednisone intake.  Has not recurred over the last several days.  Discussed monitoring and if it does recur she will let us know for further evaluation.      Hypertension    The patient notes generally blood pressure is well controlled though at times it is on the lower side in the morning when she wakes up.  When that occurs she takes only 25 mg of the losartan.  Otherwise she is taking 50 mg once daily.  Discussed that it is okay to continue this way as long as her blood pressure remains adequately controlled.         This visit occurred during the SARS-CoV-2 public health emergency.  Safety protocols were in place, including screening questions prior to the visit, additional usage of staff PPE, and extensive cleaning of exam room while observing appropriate contact time as indicated for disinfecting solutions.    [name], MD [place]

## 2020-04-02 NOTE — Assessment & Plan Note (Signed)
The patient notes generally blood pressure is well controlled though at times it is on the lower side in the morning when she wakes up.  When that occurs she takes only 25 mg of the losartan.  Otherwise she is taking 50 mg once daily.  Discussed that it is okay to continue this way as long as her blood pressure remains adequately controlled.

## 2020-04-02 NOTE — Assessment & Plan Note (Signed)
This is a persistent issue.  We will refer back to orthopedics for further evaluation.  She was given exercises to complete at home.

## 2020-04-18 ENCOUNTER — Ambulatory Visit: Payer: Medicare PPO | Admitting: Surgical

## 2020-04-18 DIAGNOSIS — M5410 Radiculopathy, site unspecified: Secondary | ICD-10-CM

## 2020-04-18 DIAGNOSIS — M1612 Unilateral primary osteoarthritis, left hip: Secondary | ICD-10-CM

## 2020-04-18 DIAGNOSIS — M545 Low back pain, unspecified: Secondary | ICD-10-CM

## 2020-04-18 DIAGNOSIS — M25552 Pain in left hip: Secondary | ICD-10-CM

## 2020-04-19 ENCOUNTER — Encounter: Payer: Self-pay | Admitting: Surgical

## 2020-04-19 NOTE — Progress Notes (Signed)
Office Visit Note   Patient: Donna Rios           Date of Birth: [birth year]           MRN: [id number] Visit Date: 04/18/2020 Requested by: [name], MD [address] [place],  [place] [address] PCP: [name], MD  Subjective: Chief Complaint  Patient presents with  . Lower Back - Pain    HPI: Donna Rios is a 70 y.o. female who presents to the office complaining of low back pain.  She has had low back pain over the last year.  She also complains of radicular pain down her left leg into her left foot.  She fell in 2021 and landed on her back which is when most of her pain began.  Pain does come and go but bothers her most every day.  She does wake with pain in her groin and with the radicular pain.  She complains of groin pain primarily with sitting for long periods of time.  She feels she does not have the range of motion in the left hip as she does in the right hip.  She is taking occasional Tylenol without relief, using ointment without relief.  Ambulates with a cane.  She saw her primary care physician in early March and had a prednisone course that did not provide lasting relief and caused palpitations.  She has history of TAVR in 2020 and takes Eliquis twice per day.  Denies any history of diabetes.  Her goal is to get her symptoms under control for a 9-hour drive that she has to make at the end of April..                ROS: All systems reviewed are negative as they relate to the chief complaint within the history of present illness.  Patient denies fevers or chills.  Assessment & Plan: Visit Diagnoses:  1. Low back pain, unspecified back pain laterality, unspecified chronicity, unspecified whether sciatica present   2. Pain in left hip   3. Radicular pain of left lower extremity   4. Unilateral primary osteoarthritis, left hip     Plan: Patient is a 70 year old female who presents complaining of left leg pain.  She has low back pain over the  last year with associated radiculopathy down the left leg that travels down the posterior leg into her left foot.  She also has left-sided groin pain that bothers her about as much as the radicular pain.  She has physical exam findings that are concerning for hip arthritis with radiographic findings of moderate hip arthritis from her previous lumbar spine radiographs in July 2021.  With continued radicular pain is now waking her up at night as well as continued groin pain, plan to order MRI of the lumbar spine for further evaluation of her radicular pain and refer patient to Dr. Laurence Spates for intra-articular left hip injection.  She has never had a hip injection before.  Radiographs from July 2021 were reviewed with the patient today and reveal spondylolisthesis at L4-L5 with disc space narrowing at L5-S1 and facet arthritis throughout the inferior lumbar spine.  Plan to follow-up after lumbar spine MRI to review results.  We will assess her response to the left hip injection at that time as well.  Follow-Up Instructions: No follow-ups on file.   Orders:  Orders Placed This Encounter  Procedures  . MR Lumbar Spine w/o contrast  . Ambulatory referral  to Physical Medicine Rehab   No orders of the defined types were placed in this encounter.     Procedures: No procedures performed   Clinical Data: No additional findings.  Objective: Vital Signs: There were no vitals taken for this visit.  Physical Exam:  Constitutional: Patient appears well-developed HEENT:  Head: Normocephalic Eyes:EOM are normal Neck: Normal range of motion Cardiovascular: Normal rate Pulmonary/chest: Effort normal Neurologic: Patient is alert Skin: Skin is warm Psychiatric: Patient has normal mood and affect  Ortho Exam: Ortho exam demonstrates left hip with decreased internal rotation compared with the contralateral hip.  She has pain with internal rotation that is localized primarily to her groin.  Positive  Stinchfield exam.  She lacks about 20 degrees of hip flexion compared with the contralateral hip.  She has pain with terminal hip flexion.  Negative straight leg raise.  5/5 motor strength of bilateral hip flexor, quadricep, hamstring, dorsiflexion, plantarflexion.  Sensation intact all dermatomes of bilateral lower extremities.  Patellar tendon reflexes are normoreflexic bilaterally.  She has tenderness throughout the axial lumbar spine toward the level of L4-L5.  Pain with extension of the spine but no significant pain with flexion.  Specialty Comments:  No specialty comments available.  Imaging: No results found.   PMFS History: Patient Active Problem List   Diagnosis Date Noted  . Elevated pulse rate 04/02/2020  . Aortic atherosclerosis ([place]) 03/21/2020  . Chronic low back pain 12/19/2019  . Hand tingling 12/19/2019  . Encounter for general adult medical examination with abnormal findings 09/18/2019  . Postmenopausal estrogen deficiency 09/18/2019  . Nicotine dependence, cigarettes, uncomplicated 22/97/9892  . Falls 06/11/2019  . Injury of little finger 06/11/2019  . Chronic pain of both knees 06/11/2019  . Atypical chest pain 03/27/2019  . Itching 03/27/2019  . Hypertension 11/28/2018  . Hair loss 10/21/2018  . Breast pain, left 10/21/2018  . Headache 07/18/2018  . S/P TAVR (transcatheter aortic valve replacement)   . GERD (gastroesophageal reflux disease) 06/13/2018  . Foot pain, bilateral 02/10/2018  . Severe aortic valve stenosis 08/16/2017  . Chronic left hip pain 08/10/2017  . Rotator cuff impingement syndrome of left shoulder 08/10/2017  . Elevated BP without diagnosis of hypertension 05/09/2017  . Anxiety 05/09/2017  . Lesion of right eyelid 04/01/2017  . Hyperlipidemia 05/07/2016  . Prediabetes 11/06/2015  . Urine frequency 08/15/2015  . Venous insufficiency 06/11/2015  . Stress incontinence 06/11/2015  . Squamous cell carcinoma of skin 06/11/2015  . Former  smoker 06/11/2015  . Chronic headaches 06/11/2015   Past Medical History:  Diagnosis Date  . Allergic rhinitis   . Arthritis   . Fatty liver   . Hypertension   . Phlebitis   . S/P TAVR (transcatheter aortic valve replacement)   . Severe aortic stenosis   . Stress incontinence   . Tobacco abuse     Family History  Problem Relation Age of Onset  . Alcoholism Other   . Arthritis Other   . Lung cancer Other   . Heart disease Other   . Stroke Other   . Hypertension Other   . Diabetes Other   . Heart failure Mother   . Hypertension Mother   . Diabetes Mother   . Heart failure Father   . Hypotension Father   . Breast cancer Neg Hx     Past Surgical History:  Procedure Laterality Date  . ABDOMINAL HYSTERECTOMY    . BREAST BIOPSY Left    ducts removed  . Cataract  surgery    . COLONOSCOPY WITH PROPOFOL N/A 09/29/2017   Procedure: COLONOSCOPY WITH PROPOFOL;  Surgeon: Lin Landsman, MD;  Location: [facility] ENDOSCOPY;  Service: Gastroenterology;  Laterality: N/A;  . EYE SURGERY    . FOOT SURGERY     7  . INTRAOPERATIVE TRANSTHORACIC ECHOCARDIOGRAM N/A 07/11/2018   Procedure: Intraoperative Transthoracic Echocardiogram;  Surgeon: Sherren Mocha, MD;  Location: [facility];  Service: Open Heart Surgery;  Laterality: N/A;  . KNEE ARTHROSCOPY Right   . LAPAROSCOPIC HYSTERECTOMY    . RIGHT HEART CATH AND CORONARY ANGIOGRAPHY N/A 06/15/2018   Procedure: RIGHT HEART CATH AND CORONARY ANGIOGRAPHY;  Surgeon: Sherren Mocha, MD;  Location: [facility];  Service: Cardiovascular;  Laterality: N/A;  . TRANSCATHETER AORTIC VALVE REPLACEMENT, TRANSFEMORAL  07/11/2018  . TRANSCATHETER AORTIC VALVE REPLACEMENT, TRANSFEMORAL N/A 07/11/2018   Procedure: TRANSCATHETER AORTIC VALVE REPLACEMENT, TRANSFEMORAL;  Surgeon: Sherren Mocha, MD;  Location: [facility];  Service: Open Heart Surgery;  Laterality: N/A;  . VARICOSE VEIN SURGERY     Social History   Occupational History  . Not on file  Tobacco  Use  . Smoking status: Current Some Day Smoker    Packs/day: 0.50    Years: 46.00    Pack years: 23.00    Types: Cigarettes  . Smokeless tobacco: Never Used  . Tobacco comment: stopped 06/11/2018...smokes 1-10 per day  Vaping Use  . Vaping Use: Never used  Substance and Sexual Activity  . Alcohol use: Not Currently    Alcohol/week: 0.0 standard drinks  . Drug use: No  . Sexual activity: Not on file

## 2020-04-24 ENCOUNTER — Telehealth: Payer: Medicare PPO

## 2020-04-29 ENCOUNTER — Telehealth: Payer: Self-pay

## 2020-04-29 NOTE — Progress Notes (Signed)
Cardiology Office Note  Date:  04/30/2020   ID:  [name], DOB [birth year], MRN [id number]  PCP:  Donna Haven, MD   Chief Complaint  Patient presents with  . Other    6 month follow up. Meds reviewed verbally with patient.     HPI:  Donna Rios is a 70 y.o. woman with past medical history of Hyperlipidemia Smoking,  Severe aortic valve stenosis, TAVR Venous insufficiency  Cath in 05/2018:no significant CAD Who presents to the office today for follow-up of her bicuspid AoV with severe AS s/p TAVR (07/11/18), PAF on eliquis  LOV 10/2019  In follow-up today reports that she is unsteady on her feet Has had several falls, x4 Chronic backpain Has MRI scheduled for lower back Also with hip pain, scheduled for cortisone  CT chest 10/21, images pulled up and reviewed with her Labeled as having aortic atherosclerosis very mild in arch  Runs of atrial fib 4/2/202, was taking prednisone for back pain Rate 130 to 140 Otherwise Heart rate typically runs in the 60-70 range She feels the atrial fibrillation was triggered by the prednisone, she did have symptoms Tolerating her Eliquis twice daily  Echo 06/2019: Reviewed EF normal, stable TAVR  Smoking again, trying to quit  EKG personally reviewed by myself on todays visit NSR rate 76 bpm, no significnat ST or T wave changes  echo  07/11/18. TAVR with a53mm Edwards Sapien 3 THV via the TF approach on She had a brief run of Afib and was cardioverted back to sinus rhythm  zio did show atrial fib, paroxysmal  CHADS VASC probably 4   stop plavix and start eliquis 5 mg BID. Continue ASA 81 mg daily until 6 months out from TAVR then OK to use eliquis alone  Treadmill at home, but does not use it Quit smoking  Echo 07/2018 Aortic valve regurgitation is trivial . Mean gradient 10 mm Hg, peak gradient 24 mm Hg , estimated AVA 2.45 cm sq   echocardiogram 04/10/2018.   ejection fraction is normal (60 - 65%). The right  side was also normal. Her aortic valve appears to have two leaflets rather than three. It had a valve area of 0.79 cm.  Markedly elevated mean and peak systolic pressure   OTHER PAST MEDICAL HISTORY REVIEWED BY ME FOR TODAY'S VISIT: Most recent echocardiogram in March 2020 Normal ejection fraction, 60 - 65% Left ventricle normal Right ventricle normal Mean gradient 60.5 mmHg Peak gradient 96.4 mmHg Aortic valve is bicuspid rather than tricuspid Aortic valve area 0.79 cm  Echocardiogram May 2019 Normal ejection fraction Mean gradient 46 mmHg Peak gradient 73 mmHg Take velocity 432 cm per sec and Valve area less than 1 cm    PMH:   has a past medical history of Allergic rhinitis, Arthritis, Fatty liver, Hypertension, Phlebitis, S/P TAVR (transcatheter aortic valve replacement), Severe aortic stenosis, Stress incontinence, and Tobacco abuse.  Aortic valve stenosis Smoker  PSH:    Past Surgical History:  Procedure Laterality Date  . ABDOMINAL HYSTERECTOMY    . BREAST BIOPSY Left    ducts removed  . Cataract surgery    . COLONOSCOPY WITH PROPOFOL N/A 09/29/2017   Procedure: COLONOSCOPY WITH PROPOFOL;  Surgeon: Lin Landsman, MD;  Location: [facility] ENDOSCOPY;  Service: Gastroenterology;  Laterality: N/A;  . EYE SURGERY    . FOOT SURGERY     7  . INTRAOPERATIVE TRANSTHORACIC ECHOCARDIOGRAM N/A 07/11/2018   Procedure: Intraoperative Transthoracic Echocardiogram;  Surgeon: Sherren Mocha,  MD;  Location: [facility] OR;  Service: Open Heart Surgery;  Laterality: N/A;  . KNEE ARTHROSCOPY Right   . LAPAROSCOPIC HYSTERECTOMY    . RIGHT HEART CATH AND CORONARY ANGIOGRAPHY N/A 06/15/2018   Procedure: RIGHT HEART CATH AND CORONARY ANGIOGRAPHY;  Surgeon: Sherren Mocha, MD;  Location: [facility];  Service: Cardiovascular;  Laterality: N/A;  . TRANSCATHETER AORTIC VALVE REPLACEMENT, TRANSFEMORAL  07/11/2018  . TRANSCATHETER AORTIC VALVE REPLACEMENT, TRANSFEMORAL N/A 07/11/2018    Procedure: TRANSCATHETER AORTIC VALVE REPLACEMENT, TRANSFEMORAL;  Surgeon: Sherren Mocha, MD;  Location: [facility];  Service: Open Heart Surgery;  Laterality: N/A;  . VARICOSE VEIN SURGERY      Current Outpatient Medications  Medication Sig Dispense Refill  . acetaminophen (TYLENOL) 650 MG CR tablet Take 650 mg by mouth every 8 (eight) hours as needed for pain.     Marland Kitchen amoxicillin (AMOXIL) 500 MG tablet Take 4 capsules (2,000 mg) one hour prior to all dental visits. 8 tablet 12  . apixaban (ELIQUIS) 5 MG TABS tablet Take 1 tablet (5 mg total) by mouth 2 (two) times daily. 180 tablet 1  . APPLE CIDER VINEGAR PO Take 1 tablet by mouth daily as needed (with fatty foods).     Marland Kitchen b complex vitamins tablet Take 1 tablet by mouth 2 (two) times a week.     . Calcium-Magnesium-Vitamin D (CALCIUM 1200+D3 PO) Take by mouth.    . cetirizine (ZYRTEC) 10 MG tablet Take 10 mg by mouth as needed.     . fluticasone (FLONASE) 50 MCG/ACT nasal spray Place 1 spray into both nostrils daily as needed for allergies.     Marland Kitchen ketoconazole (NIZORAL) 2 % cream Apply 1 application topically daily. 15 g 0  . Lidocaine 4 % SOLN Apply topically as needed.    Marland Kitchen losartan (COZAAR) 25 MG tablet Take 2 tablets (50 mg total) by mouth daily. 180 tablet 1  . Menthol, Topical Analgesic, (ICY HOT EX) Apply 1 application topically 4 (four) times daily as needed (muscle pain).     . Multiple Vitamin (MULTIVITAMIN WITH MINERALS) TABS tablet Take 1 tablet by mouth daily. Women's Complete Multivitamin 50+    . PNEUMOVAX 23 25 MCG/0.5ML injection     . Probiotic Product (PROBIOTIC ADVANCED PO) Take 1 tablet by mouth every other day.    Marland Kitchen Propylene Glycol (SYSTANE COMPLETE OP) Place 1 drop into both eyes 3 (three) times daily as needed (dry/irritated eyes.).     Marland Kitchen rosuvastatin (CRESTOR) 40 MG tablet TAKE 1 TABLET BY MOUTH EVERY DAY 90 tablet 2  . sodium chloride (OCEAN) 0.65 % SOLN nasal spray Place 1 spray into both nostrils 4 (four) times daily  as needed for congestion.      No current facility-administered medications for this visit.     Allergies:   Ibuprofen, Asa [aspirin], and Iohexol   Social History:  The patient  reports that she has been smoking cigarettes. She has a 23.00 pack-year smoking history. She has never used smokeless tobacco. She reports previous alcohol use. She reports that she does not use drugs.   Family History:   family history includes Alcoholism in an other family member; Arthritis in an other family member; Diabetes in her mother and another family member; Heart disease in an other family member; Heart failure in her father and mother; Hypertension in her mother and another family member; Hypotension in her father; Lung cancer in an other family member; Stroke in an other family member.  Review of Systems: Review of Systems  Constitutional: Negative.   HENT: Negative.   Eyes: Negative.   Respiratory: Negative.   Cardiovascular: Negative.   Gastrointestinal: Negative.   Genitourinary: Negative.   Musculoskeletal: Negative.   Neurological: Negative.   Psychiatric/Behavioral: Negative.   All other systems reviewed and are negative.   PHYSICAL EXAM: VS:  BP 134/80 (BP Location: Left Arm, Patient Position: Sitting, Cuff Size: Normal)   Pulse 76   Ht 5\' 8"  (1.727 m)   Wt 199 lb (90.3 kg)   SpO2 94%   BMI 30.26 kg/m  , BMI Body mass index is 30.26 kg/m. Constitutional:  oriented to person, place, and time. No distress.  HENT:  Head: Grossly normal Eyes:  no discharge. No scleral icterus.  Neck: No JVD, no carotid bruits  Cardiovascular: Regular rate and rhythm, no murmurs appreciated Pulmonary/Chest: Clear to auscultation bilaterally, no wheezes or rails Abdominal: Soft.  no distension.  no tenderness.  Musculoskeletal: Normal range of motion Neurological:  normal muscle tone. Coordination normal. No atrophy Skin: Skin warm and dry Psychiatric: normal affect, pleasant  Recent  Labs: 09/18/2019: ALT 15 10/04/2019: Hemoglobin 13.0; Platelets 176.0 12/26/2019: BUN 13; Creatinine, Ser 0.70; Potassium 4.0; Sodium 141    Lipid Panel Lab Results  Component Value Date   CHOL 153 09/18/2019   HDL 51.00 09/18/2019   LDLCALC 63 07/25/2018   TRIG 235.0 (H) 09/18/2019      Wt Readings from Last 3 Encounters:  04/30/20 199 lb (90.3 kg)  04/02/20 197 lb 9.6 oz (89.6 kg)  03/21/20 200 lb 3.2 oz (90.8 kg)     ASSESSMENT AND PLAN:  Severe aortic valve stenosis S/p TAVR Echo 06/2019,  Stable, repeat echo in one year   Aortic atherosclerosis mild disease noted on evaluation of her CT, images pulled up and reviewed with her  Paroxysmal atrial fibrillation Seen on event monitor,  eliquis 5 BID Recent episode tachypalpitations concerning for atrial fibrillation heart rate up to 130 bpm beginning of March 2022, was on prednisone at the time for back pain Recommend she take metoprolol tartrate 25 mg twice daily as needed for elevated heart rate over 100 bpm concerning for atrial fibrillation -Will not add metoprolol on a regular basis as blood pressure somewhat labile, sometimes low -For increasing frequency of atrial fibrillation episodes we would decrease the losartan, add metoprolol succinate 25 daily  Tobacco abuse We have encouraged her to continue to work on weaning her cigarettes and smoking cessation. She will continue to work on this and does not want any assistance with chantix. Has patches at home  Prediabetes We have encouraged continued exercise, careful diet management in an effort to lose weight. A1C 6.0  Mixed hyperlipidemia Cholesterol is at goal on the current lipid regimen. No changes to the medications were made.    Total encounter time more than 25 minutes  Greater than 50% was spent in counseling and coordination of care with the patient   No orders of the defined types were placed in this encounter.    Signed, Esmond Plants, M.D.,  Ph.D. 04/30/2020  [place], [place]

## 2020-04-29 NOTE — Telephone Encounter (Signed)
Called patient to schedule a appointment for her mri review she completes her mri tomorrow I called to schedule her for the next available appointment patient stated she will be out of town so she wont be able to come patient would like a call back once Dr.Dean recieves her results call back:[phone]

## 2020-04-29 NOTE — Telephone Encounter (Signed)
Holding until MRI is completed and read.

## 2020-04-30 ENCOUNTER — Other Ambulatory Visit: Payer: Self-pay

## 2020-04-30 ENCOUNTER — Encounter: Payer: Self-pay | Admitting: Cardiovascular Disease

## 2020-04-30 ENCOUNTER — Ambulatory Visit: Payer: Medicare PPO | Admitting: Cardiovascular Disease

## 2020-04-30 ENCOUNTER — Ambulatory Visit
Admission: RE | Admit: 2020-04-30 | Discharge: 2020-04-30 | Disposition: A | Discharge status: Home / Self Care | Payer: Medicare PPO | Source: Ambulatory Visit | Attending: Surgical | Admitting: Surgical

## 2020-04-30 VITALS — BP 134/80 | HR 76 | Ht 68.0 in | Wt 199.0 lb

## 2020-04-30 DIAGNOSIS — E782 Mixed hyperlipidemia: Secondary | ICD-10-CM | POA: Diagnosis not present

## 2020-04-30 DIAGNOSIS — M545 Low back pain, unspecified: Secondary | ICD-10-CM | POA: Insufficient documentation

## 2020-04-30 DIAGNOSIS — I10 Essential (primary) hypertension: Secondary | ICD-10-CM | POA: Diagnosis not present

## 2020-04-30 DIAGNOSIS — I350 Nonrheumatic aortic (valve) stenosis: Secondary | ICD-10-CM | POA: Diagnosis not present

## 2020-04-30 DIAGNOSIS — Z720 Tobacco use: Secondary | ICD-10-CM

## 2020-04-30 DIAGNOSIS — Z952 Presence of prosthetic heart valve: Secondary | ICD-10-CM

## 2020-04-30 DIAGNOSIS — I2510 Atherosclerotic heart disease of native coronary artery without angina pectoris: Secondary | ICD-10-CM

## 2020-04-30 DIAGNOSIS — I480 Paroxysmal atrial fibrillation: Secondary | ICD-10-CM | POA: Diagnosis not present

## 2020-04-30 MED ORDER — METOPROLOL TARTRATE 25 MG PO TABS: 25.0000 mg | ORAL_TABLET | Freq: Two times a day (BID) | ORAL | 3 refills | Status: DC | PRN

## 2020-04-30 NOTE — Patient Instructions (Addendum)
Medication Instructions:  Please take metoprolol tartrate 25 mg up to twice a day as needed   For atrial fibrillation, fast heart , sustained >100 BPM (heart rate)  Lab work: No new labs needed  Testing/Procedures: Echo (aortic valve disease, TAVR)  Will have schedule 1 year, around April 2023  Your physician has requested that you have an echocardiogram. Echocardiography is a painless test that uses sound waves to create images of your heart. It provides your doctor with information about the size and shape of your heart and how well your heart's chambers and valves are working. This procedure takes approximately one hour. There are no restrictions for this procedure.  There is a possibility that an IV may need to be started during your test to inject an image enhancing agent. This is done to obtain more optimal pictures of your heart. Therefore we ask that you do at least drink some water prior to coming in to hydrate your veins.    Follow-Up:  . You will need a follow up appointment in 12 months after echo  Schedule after echo has been schedule   . Providers on your designated Care Team:   . Murray Hodgkins, NP . Christell Faith, PA-C . Marrianne Mood, PA-C   COVID-19 Vaccine Information can be found at: ShippingScam.co.uk For questions related to vaccine distribution or appointments, please email vaccine@[facility] .com or call [phone].

## 2020-05-01 ENCOUNTER — Encounter: Payer: Self-pay | Admitting: Physical Medicine and Rehabilitation

## 2020-05-01 ENCOUNTER — Ambulatory Visit: Payer: Self-pay

## 2020-05-01 ENCOUNTER — Ambulatory Visit (INDEPENDENT_AMBULATORY_CARE_PROVIDER_SITE_OTHER): Payer: Medicare PPO | Admitting: Physical Medicine and Rehabilitation

## 2020-05-01 DIAGNOSIS — M25552 Pain in left hip: Secondary | ICD-10-CM | POA: Diagnosis not present

## 2020-05-01 MED ORDER — TRIAMCINOLONE ACETONIDE 40 MG/ML IJ SUSP
60.0000 mg | INTRAMUSCULAR | Status: AC | PRN
  Administered 2020-05-01: 60 mg via INTRA_ARTICULAR

## 2020-05-01 MED ORDER — BUPIVACAINE HCL 0.25 % IJ SOLN
4.0000 mL | INTRAMUSCULAR | Status: AC | PRN
  Administered 2020-05-01: 4 mL via INTRA_ARTICULAR

## 2020-05-01 NOTE — Progress Notes (Signed)
Left hip/ groin pain. Difficulty bending to tie shoes or put on socks.  Low back and buttock pain- had MRI yesterday.  States that contrast causes itching and rash.States that oral prednisone made her heart race when she took it recently, but her cardiologist gave her something to take in case that happens again.   Numeric Pain Rating Scale and Functional Assessment Average Pain 5   In the last MONTH (on 0-10 scale) has pain interfered with the following?  1. General activity like being  able to carry out your everyday physical activities such as walking, climbing stairs, carrying groceries, or moving a chair?  Rating(8)    +Dye Allergies.

## 2020-05-01 NOTE — Patient Instructions (Signed)

## 2020-05-01 NOTE — Telephone Encounter (Signed)
See below. Wanting to be called with results.

## 2020-05-01 NOTE — Telephone Encounter (Signed)
I called - pls see if you can get her in with [facility] before 4/18 for injection thx

## 2020-05-01 NOTE — Progress Notes (Signed)
   Donna Rios - 70 y.o. female MRN [id number]  Date of birth: 1950-08-01  Office Visit Note: Visit Date: 05/01/2020 PCP: [name], MD Referred by: [name], MD  Subjective: Chief Complaint  Patient presents with  . Left Hip - Pain   HPI:  PATTE WINKEL is a 70 y.o. female who comes in today at the request of Dr. Laurence Spates for planned Left anesthetic hip arthrogram with fluoroscopic guidance.  The patient has failed conservative care including home exercise, medications, time and activity modification.  This injection will be diagnostic and hopefully therapeutic.  Please see requesting physician notes for further details and justification.  Because of contrast allergy we will use biplanar imaging positioning and medication flows of indication of intra-articular position.   ROS Otherwise per HPI.  Assessment & Plan: Visit Diagnoses:    ICD-10-CM   1. Pain in left hip  M25.552 XR C-ARM NO REPORT    Large Joint Inj: L hip joint    Plan: No additional findings.   Meds & Orders: No orders of the defined types were placed in this encounter.   Orders Placed This Encounter  Procedures  . Large Joint Inj: L hip joint  . XR C-ARM NO REPORT    Follow-up: Return if symptoms worsen or fail to improve, for Annie Main, MD.   Procedures: Large Joint Inj: L hip joint on 05/01/2020 12:54 PM Indications: diagnostic evaluation and pain Details: 22 G 3.5 in needle, fluoroscopy-guided anterior approach  Arthrogram: No  Medications: 4 mL bupivacaine 0.25 %; 60 mg triamcinolone acetonide 40 MG/ML Outcome: tolerated well, no immediate complications  There was excellent biplanar needle position intra-articularly.  There was easy flow of medication without resistance.. The patient did have relief of symptoms during the anesthetic phase of the injection. Procedure, treatment alternatives, risks and benefits explained, specific risks discussed. Consent was given by the  patient. Immediately prior to procedure a time out was called to verify the correct patient, procedure, equipment, support staff and site/side marked as required. Patient was prepped and draped in the usual sterile fashion.          Clinical History: No specialty comments available.     Objective:  VS:  HT:    WT:   BMI:     BP:   HR: bpm  TEMP: ( )  RESP:  Physical Exam   Imaging:

## 2020-05-02 ENCOUNTER — Telehealth: Payer: Self-pay | Admitting: Orthopedic Surgery

## 2020-05-02 ENCOUNTER — Telehealth: Payer: Self-pay | Admitting: Physical Medicine and Rehabilitation

## 2020-05-02 NOTE — Telephone Encounter (Signed)
Dr. Ernestina Patches said we can try to get approval for an ESI based on her MRI and try to get her in next week.

## 2020-05-02 NOTE — Telephone Encounter (Signed)
See below. Please advise.  

## 2020-05-02 NOTE — Telephone Encounter (Signed)
thx

## 2020-05-02 NOTE — Telephone Encounter (Signed)
Needs auth and scheduling for left L4 TF. Needs to be worked in next week- ? Thursday afternoon for 30 min.

## 2020-05-02 NOTE — Telephone Encounter (Signed)
Ok perfect. Thanks  °

## 2020-05-02 NOTE — Telephone Encounter (Signed)
What type of injection does she need? We did a hip injection yesterday. I don't think we will be able to get her in for [facility] before 4/18.

## 2020-05-02 NOTE — Telephone Encounter (Signed)
Will you ask him if you guys would be able to accommodate patient before 04/18. Thanks

## 2020-05-05 ENCOUNTER — Telehealth: Payer: Self-pay | Admitting: Orthopedic Surgery

## 2020-05-05 NOTE — Telephone Encounter (Signed)
Patient called requesting a call from Lauren F. Patient states she waiting for call from [place] to discuss and set back injection appt. Please call patient at [phone].

## 2020-05-05 NOTE — Telephone Encounter (Signed)
See below patient calling back about scheduling.

## 2020-05-06 NOTE — Telephone Encounter (Signed)
Pt has been sch

## 2020-05-07 ENCOUNTER — Telehealth: Payer: Self-pay | Admitting: Physical Medicine and Rehabilitation

## 2020-05-07 NOTE — Telephone Encounter (Signed)
Pt called stating she missed a call from [facility] and would like her to try her again please.   [phone]

## 2020-05-08 ENCOUNTER — Ambulatory Visit: Payer: Medicare PPO | Admitting: Physical Medicine and Rehabilitation

## 2020-05-15 ENCOUNTER — Other Ambulatory Visit: Payer: Self-pay | Admitting: Cardiovascular Disease

## 2020-05-15 DIAGNOSIS — I10 Essential (primary) hypertension: Secondary | ICD-10-CM

## 2020-05-15 NOTE — Telephone Encounter (Signed)
Rx request sent to pharmacy.  

## 2020-05-23 ENCOUNTER — Ambulatory Visit: Payer: Medicare PPO | Admitting: Neurology

## 2020-05-28 ENCOUNTER — Ambulatory Visit: Payer: Medicare PPO | Admitting: Physical Medicine and Rehabilitation

## 2020-05-29 ENCOUNTER — Other Ambulatory Visit: Payer: Self-pay

## 2020-05-29 ENCOUNTER — Ambulatory Visit: Payer: Medicare PPO | Admitting: Physical Medicine and Rehabilitation

## 2020-05-29 ENCOUNTER — Ambulatory Visit: Payer: Self-pay

## 2020-05-29 VITALS — BP 168/96 | HR 87

## 2020-05-29 DIAGNOSIS — M5416 Radiculopathy, lumbar region: Secondary | ICD-10-CM

## 2020-05-29 MED ORDER — METHYLPREDNISOLONE ACETATE 80 MG/ML IJ SUSP
80.0000 mg | Freq: Once | INTRAMUSCULAR | Status: AC
  Administered 2020-05-29: 80 mg

## 2020-05-29 NOTE — Progress Notes (Signed)
Pt state lower back pain mostly the left side. Pt state standing, walking and riding in a car makes the pain worse. Pt state she takes over the counter pain meds.  Numeric Pain Rating Scale and Functional Assessment Average Pain 8   In the last MONTH (on 0-10 scale) has pain interfered with the following?  1. General activity like being  able to carry out your everyday physical activities such as walking, climbing stairs, carrying groceries, or moving a chair?  Rating(8)   +Driver, +BT, +Dye Allergies.(lolhexol)

## 2020-05-29 NOTE — Patient Instructions (Signed)

## 2020-06-07 ENCOUNTER — Other Ambulatory Visit: Payer: Self-pay | Admitting: Family Medicine

## 2020-06-09 ENCOUNTER — Other Ambulatory Visit: Payer: Self-pay | Admitting: Cardiovascular Disease

## 2020-06-09 DIAGNOSIS — I10 Essential (primary) hypertension: Secondary | ICD-10-CM

## 2020-06-09 MED ORDER — LOSARTAN POTASSIUM 25 MG PO TABS: 50.0000 mg | ORAL_TABLET | Freq: Every day | ORAL | 1 refills | Status: DC

## 2020-06-09 NOTE — Telephone Encounter (Signed)
*  STAT* If patient is at the pharmacy, call can be transferred to refill team.   1. Which medications need to be refilled? (please list name of each medication and dose if known) Eliquis & Losartin  2. Which pharmacy/location (including street and city if local pharmacy) is medication to be sent to?[facility], [address]  3. Do they need a 30 day or 90 day supply? 90 day

## 2020-06-09 NOTE — Telephone Encounter (Signed)
Please advise on correct dosage of losartan. Listed on last AVS from 04-30-20 as losartan 25mg  2 tablets PO daily but on med list it lists losartan 25mg  daily.  Per Dr. Donivan Scull 05/20/20 office note: -For increasing frequency of atrial fibrillation episodes we would decrease the losartan, add metoprolol succinate 25 daily  Thank you!

## 2020-06-10 ENCOUNTER — Ambulatory Visit (INDEPENDENT_AMBULATORY_CARE_PROVIDER_SITE_OTHER): Payer: Medicare PPO | Admitting: Pharmacist

## 2020-06-10 DIAGNOSIS — E782 Mixed hyperlipidemia: Secondary | ICD-10-CM

## 2020-06-10 DIAGNOSIS — G8929 Other chronic pain: Secondary | ICD-10-CM

## 2020-06-10 DIAGNOSIS — I10 Essential (primary) hypertension: Secondary | ICD-10-CM | POA: Diagnosis not present

## 2020-06-10 DIAGNOSIS — I700 Atherosclerosis of aorta: Secondary | ICD-10-CM

## 2020-06-10 DIAGNOSIS — F17210 Nicotine dependence, cigarettes, uncomplicated: Secondary | ICD-10-CM

## 2020-06-10 DIAGNOSIS — F419 Anxiety disorder, unspecified: Secondary | ICD-10-CM

## 2020-06-10 MED ORDER — LOSARTAN POTASSIUM 25 MG PO TABS: 25.0000 mg | ORAL_TABLET | Freq: Every day | ORAL | 3 refills | Status: DC

## 2020-06-10 MED ORDER — ELIQUIS 5 MG PO TABS: 5.0000 mg | ORAL_TABLET | Freq: Two times a day (BID) | ORAL | 3 refills | Status: DC

## 2020-06-10 NOTE — Patient Instructions (Addendum)
Donna Rios,   It was great talking to you today!  Please let me know how we can help you with your goal to quit smoking.   Call me with any questions or concerns before our next appointment.   Take care!  Catie Darnelle Maffucci, PharmD [phone]  Visit Information  PATIENT GOALS: Goals Addressed              This Visit's Progress     Patient Stated   .  Medication Monitoring (pt-stated)        Patient Goals/Self-Care Activities . Over the next 90 days, patient will:  - take medications as prescribed check blood pressure daily, document, and provide at future appointments       The patient verbalized understanding of instructions, educational materials, and care plan provided today and agreed to receive a mailed copy of patient instructions, educational materials, and care plan.   Plan: Telephone follow up appointment with care management team member scheduled for:  ~ 10 weeks  Catie Darnelle Maffucci, PharmD, [place], [place] Clinical Pharmacist [employer] [facility] [employer] [phone]

## 2020-06-10 NOTE — Chronic Care Management (AMB) (Signed)
Chronic Care Management Pharmacy Note  06/10/2020 Name:  DACY ENRICO MRN:  [id number] DOB:  12-11-1950  Subjective: Donna Rios is an 70 y.o. year old female who is a primary patient of [name], [name], MD.  The CCM team was consulted for assistance with disease management and care coordination needs.    Engaged with patient by telephone for follow up visit in response to provider referral for pharmacy case management and/or care coordination services.   Consent to Services:  The patient was given information about Chronic Care Management services, agreed to services, and gave verbal consent prior to initiation of services.  Please see initial visit note for detailed documentation.   Patient Care Team: [name], MD as PCP - General (Family Medicine) De Hollingshead, RPH-CPP as Pharmacist (Pharmacist)  Recent office visits:  3/9 - PCP visit  - f/u low back pain, tachycardia  Recent consult visits:  4/6- cardiology Dr. Rockey Situ for afib f/u. Advised to reduce losartan and add metoprolol 25 mg BID PRN tachycardia >100  3/25 - Charles Magnant, ortho- low back pain; recommended spine injection  Hospital visits: None in previous 6 months  Objective:  Lab Results  Component Value Date   CREATININE 0.70 12/26/2019   CREATININE 0.73 09/18/2019   CREATININE 0.79 02/21/2019    Lab Results  Component Value Date   HGBA1C 6.0 12/26/2019   Last diabetic Eye exam: No results found for: HMDIABEYEEXA  Last diabetic Foot exam: No results found for: HMDIABFOOTEX      Component Value Date/Time   CHOL 153 09/18/2019 1010   TRIG 235.0 (H) 09/18/2019 1010   HDL 51.00 09/18/2019 1010   CHOLHDL 3 09/18/2019 1010   VLDL 47.0 (H) 09/18/2019 1010   LDLCALC 63 07/25/2018 1121   LDLDIRECT 71.0 09/18/2019 1010    Hepatic Function Latest Ref Rng & Units 09/18/2019 03/27/2019 07/25/2018  Total Protein 6.0 - 8.3 g/dL 6.8 7.0 6.9  Albumin 3.5 - 5.2 g/dL 4.4 4.1 4.0  AST 0  - 37 U/L 20 19 20   ALT 0 - 35 U/L 15 18 20   Alk Phosphatase 39 - 117 U/L 92 78 84  Total Bilirubin 0.2 - 1.2 mg/dL 0.5 0.4 0.2(L)  Bilirubin, Direct 0.0 - 0.3 mg/dL - 0.1 -    Lab Results  Component Value Date/Time   TSH 0.81 10/16/2018 02:25 PM   TSH 1.410 08/16/2018 03:42 PM   FREET4 0.92 10/16/2018 02:25 PM    CBC Latest Ref Rng & Units 10/04/2019 09/18/2019 07/25/2018  WBC 4.0 - 10.5 K/uL 7.3 10.7(H) 8.9  Hemoglobin 12.0 - 15.0 g/dL 13.0 13.3 11.7(L)  Hematocrit 36.0 - 46.0 % 38.4 38.8 34.2(L)  Platelets 150.0 - 400.0 K/uL 176.0 191.0 226    No results found for: VD25OH  Clinical ASCVD: No  The 10-year ASCVD risk score Mikey Bussing DC Jr., et al., 2013) is: 27.8%   Values used to calculate the score:     Age: 78 years     Sex: Female     Is Non-Hispanic African American: No     Diabetic: No     Tobacco smoker: Yes     Systolic Blood Pressure: 734 mmHg     Is BP treated: Yes     HDL Cholesterol: 51 mg/dL     Total Cholesterol: 153 mg/dL     CHA2DS2-VASc Score = 4  This indicates a 4.8% annual risk of stroke. The patient's score is based upon: CHF History: No  HTN History: Yes Diabetes History: No Stroke History: No Vascular Disease History: Yes Age Score: 1 Gender Score: 1    Social History   Tobacco Use  Smoking Status Current Some Day Smoker  . Packs/day: 0.50  . Years: 46.00  . Pack years: 23.00  . Types: Cigarettes  Smokeless Tobacco Never Used  Tobacco Comment   stopped 06/11/2018...smokes 1-10 per day   BP Readings from Last 3 Encounters:  05/29/20 (!) 168/96  04/30/20 134/80  04/02/20 120/85   Pulse Readings from Last 3 Encounters:  05/29/20 87  04/30/20 76  04/02/20 86   Wt Readings from Last 3 Encounters:  04/30/20 199 lb (90.3 kg)  04/02/20 197 lb 9.6 oz (89.6 kg)  03/21/20 200 lb 3.2 oz (90.8 kg)    Assessment: Review of patient past medical history, allergies, medications, health status, including review of consultants reports, laboratory  and other test data, was performed as part of comprehensive evaluation and provision of chronic care management services.   SDOH:  (Social Determinants of Health) assessments and interventions performed:  SDOH Interventions   Flowsheet Row Most Recent Value  SDOH Interventions   Financial Strain Interventions Intervention Not Indicated      CCM Care Plan  Allergies  Allergen Reactions  . Ibuprofen Other (See Comments)    High does causes stomach pains  . Asa [Aspirin] Other (See Comments)    UNSPECIFIED REACTION   . Iohexol Rash    Pt had reaction after 06/21/18 TAVR CT scans    Medications Reviewed Today    Reviewed by De Hollingshead, RPH-CPP (Pharmacist) on 06/10/20 at 1354  Med List Status: <None>  Medication Order Taking? Sig Documenting Provider Last Dose Status Informant  acetaminophen (TYLENOL) 650 MG CR tablet [id number] No Take 650 mg by mouth every 8 (eight) hours as needed for pain.   Patient not taking: Reported on 06/10/2020   [provider] Not Taking Active Self  amoxicillin (AMOXIL) 500 MG tablet [id number] No Take 4 capsules (2,000 mg) one hour prior to all dental visits.  Patient not taking: Reported on 06/10/2020   Eileen Stanford, PA-C Not Taking Active   apixaban (ELIQUIS) 5 MG TABS tablet [id number] Yes Take 1 tablet (5 mg total) by mouth 2 (two) times daily. [name], MD Taking Active   APPLE CIDER VINEGAR PO [id number] Yes Take 1 tablet by mouth daily as needed (with fatty foods).  [provider] Taking Active Self  b complex vitamins tablet [id number] Yes Take 1 tablet by mouth 2 (two) times a week.  [provider] Taking Active Self  Calcium-Magnesium-Vitamin D (CALCIUM 1200+D3 PO) [id number] Yes Take by mouth. [provider] Taking Active   cetirizine (ZYRTEC) 10 MG tablet [id number] Yes Take 10 mg by mouth as needed.  [provider] Taking Active Self  fluticasone (FLONASE) 50 MCG/ACT nasal  spray [id number] Yes Place 1 spray into both nostrils daily as needed for allergies.  [provider] Taking Active Self  ketoconazole (NIZORAL) 2 % cream [id number] No Apply 1 application topically daily.  Patient not taking: Reported on 06/10/2020   [name], MD Not Taking Active   Lidocaine 4 % SOLN [id number] Yes Apply topically as needed. [provider] Taking Active   losartan (COZAAR) 25 MG tablet [id number] Yes Take 1 tablet (25 mg total) by mouth daily. [name], MD Taking Active   Menthol, Topical Analgesic, (ICY HOT EX) [id number] No Apply 1 application topically 4 (  four) times daily as needed (muscle pain).   Patient not taking: Reported on 06/10/2020   [provider] Not Taking Active Self  methylPREDNISolone acetate (DEPO-MEDROL) injection 80 mg [id number]   Magnus Sinning, MD  Consider Medication Status and Discontinue (Completed Course)   metoprolol tartrate (LOPRESSOR) 25 MG tablet [id number] Yes Take 1 tablet (25 mg total) by mouth 2 (two) times daily as needed (for fast heart rate >100, atrial fib). Minna Merritts, MD Taking Active   Multiple Vitamin (MULTIVITAMIN WITH MINERALS) TABS tablet [id number] Yes Take 1 tablet by mouth daily. Women's Complete Multivitamin 50+ [provider] Taking Active Self  Probiotic Product (PROBIOTIC ADVANCED PO) [id number] Yes Take 1 tablet by mouth every other day. [provider] Taking Active Self  Propylene Glycol (SYSTANE COMPLETE OP) [id number] Yes Place 1 drop into both eyes 3 (three) times daily as needed (dry/irritated eyes.).  [provider] Taking Active Self  rosuvastatin (CRESTOR) 40 MG tablet [id number] Yes TAKE 1 TABLET BY MOUTH EVERY DAY [name], MD Taking Active           Patient Active Problem List   Diagnosis Date Noted  . Elevated pulse rate 04/02/2020  . Aortic atherosclerosis ([place]) 03/21/2020  . Chronic low back pain 12/19/2019  . Hand  tingling 12/19/2019  . Encounter for general adult medical examination with abnormal findings 09/18/2019  . Postmenopausal estrogen deficiency 09/18/2019  . Nicotine dependence, cigarettes, uncomplicated 82/42/3536  . Falls 06/11/2019  . Injury of little finger 06/11/2019  . Chronic pain of both knees 06/11/2019  . Atypical chest pain 03/27/2019  . Itching 03/27/2019  . Hypertension 11/28/2018  . Hair loss 10/21/2018  . Breast pain, left 10/21/2018  . Headache 07/18/2018  . S/P TAVR (transcatheter aortic valve replacement)   . GERD (gastroesophageal reflux disease) 06/13/2018  . Foot pain, bilateral 02/10/2018  . Severe aortic valve stenosis 08/16/2017  . Chronic left hip pain 08/10/2017  . Rotator cuff impingement syndrome of left shoulder 08/10/2017  . Elevated BP without diagnosis of hypertension 05/09/2017  . Anxiety 05/09/2017  . Lesion of right eyelid 04/01/2017  . Hyperlipidemia 05/07/2016  . Prediabetes 11/06/2015  . Urine frequency 08/15/2015  . Venous insufficiency 06/11/2015  . Stress incontinence 06/11/2015  . Squamous cell carcinoma of skin 06/11/2015  . Former smoker 06/11/2015  . Chronic headaches 06/11/2015    Immunization History  Administered Date(s) Administered  . Fluad Quad(high Dose 65+) 10/09/2018  . Influenza, High Dose Seasonal PF 11/06/2015, 10/06/2016, 11/06/2019  . Influenza-Unspecified 10/05/2017  . Moderna Sars-Covid-2 Vaccination 03/21/2019, 05/19/2019  . Pneumococcal Conjugate-13 05/06/2016  . Pneumococcal Polysaccharide-23 06/07/2017, 08/15/2019  . Tdap 05/06/2016  . Zoster Recombinat (Shingrix) 08/15/2019, 10/18/2019    Conditions to be addressed/monitored: Atrial Fibrillation, HTN, HLD and tobacco use  Care Plan : Medication Management  Updates made by De Hollingshead, RPH-CPP since 06/10/2020 12:00 AM    Problem: Afib, HTN, tobacco use     Long-Range Goal: Disease Progression Prevention   Start Date: 06/10/2020  This Visit's  Progress: On track  Priority: High  Note:   Current Barriers:  . Complex patient at high risk of medication errors, exacerbations  Pharmacist Clinical Goal(s):  Marland Kitchen Over the next 90 days, patient will achieve adherence to monitoring guidelines and medication adherence to achieve therapeutic efficacy through collaboration with PharmD and provider.   Interventions: . 1:1 collaboration with [name], MD regarding development and update of comprehensive plan of care as evidenced by provider  attestation and co-signature . Inter-disciplinary care team collaboration (see longitudinal plan of care) . Comprehensive medication review performed; medication list updated in electronic medical record  SDOH: . Reports some anxiety, though notes she is doing fine without anxiolytic. Discussed referral back to LCSW for support. Patient declines at this time.   Atrial Fibrillation/HTN: . Controlled; current treatment: metoprolol 25 mg BID PRN HR >100, losartan 50 mg daily - but patient reports she is taking 25 mg daily, as documented in Dr. Donivan Scull last note to "reduce losartan".  . Antiplatelet regimen: Eliquis 5 mg BID . Current home readings: 120-126/60-70s.  . Notes some episodes of tachycardia, but after exertion. Reports she pulled weeds today, her HR was >100, but she rested and it came down to 70s. Has not needed metoprolol since Dr. Rockey Situ prescribed it.  . Reviewed that the prescription for losartan needs to reflect her current use, not allow for "extra" to prevent failing adherence measures. Continue losartan 25 mg daily, script updated, will route to Dr. Rockey Situ for [place]. Called [facility], advised to put the 50 mg dose back in stock and fill 25 mg dose.  . Patient also requested refill on Eliquis be sent to the pharmacy. Sent.  . Continue current regimen at this time  Hyperlipidemia: . Controlled; current treatment: rosuvastatin 40 mg daily   . Continue current regimen at this time  Tobacco  Abuse: . 1 packs per day; Declines to use varenicline given "things she has heard about it" from her niece who is a Marine scientist.  . Reviewed that GI upset is the most common side effect, but is mitigated by taking with food. Reviewed that it is less common that varenicline will cause psychiatric side effects than originally thought. Reviewed benefit in taking prior to patient being fully ready to quit to improve chances of quitting. Patient declines.  . Advised that combination patches + gum/lozenges would be expected to be more beneficial than patch monotherapy. Patient notes that she has sugar free lozenges that she will use instead.  . Discussed setting a quit date. Patient not interested.  . Patient concerned about impact of medications on her heart. Reviewed that risk of continuing to smoke outweighs any concerns of medications on cardiovascular health. Continue to evaluate readiness to quit and support in quitting.    Patient Goals/Self-Care Activities . Over the next 90 days, patient will:  - take medications as prescribed check blood pressure daily, document, and provide at future appointments  Follow Up Plan: Telephone follow up appointment with care management team member scheduled for: ~ 10 weeks      Medication Assistance: None required.  Patient affirms current coverage meets needs.  Patient's preferred pharmacy is:  [id number] [place] [place] [id number]: [phone]: [phone]  Follow Up:  Patient agrees to Care Plan and Follow-up.  Plan: Telephone follow up appointment with care management team member scheduled for:  ~ 10 weeks  Catie TDarnelle Maffucci PharmD, BLonoke CMountain MesaClinical Pharmacist LOccidental Petroleumat BJohnson & [phone]

## 2020-06-11 ENCOUNTER — Ambulatory Visit (INDEPENDENT_AMBULATORY_CARE_PROVIDER_SITE_OTHER): Payer: Medicare PPO

## 2020-06-11 ENCOUNTER — Other Ambulatory Visit: Payer: Self-pay

## 2020-06-11 VITALS — BP 134/88 | HR 75 | Temp 97.1°F | Resp 14 | Ht 68.0 in | Wt 191.8 lb

## 2020-06-11 DIAGNOSIS — Z0000 Encounter for general adult medical examination without abnormal findings: Secondary | ICD-10-CM | POA: Diagnosis not present

## 2020-06-11 NOTE — Patient Instructions (Addendum)
Ms. Donna Rios , Thank you for taking time to come for your Medicare Wellness Visit. I appreciate your ongoing commitment to your health goals. Please review the following plan we discussed and let me know if I can assist you in the future.   These are the goals we discussed: Goals      Patient Stated   .  Increase physical activity (pt-stated)    .  Medication Monitoring (pt-stated)      Patient Goals/Self-Care Activities . Over the next 90 days, patient will:  - take medications as prescribed check blood pressure daily, document, and provide at future appointments       This is a list of the screening recommended for you and due dates:  Health Maintenance  Topic Date Due  . COVID-19 Vaccine (4 - Booster for Moderna series) 03/21/2020  . Flu Shot  08/25/2020  . Mammogram  11/07/2021  . Tetanus Vaccine  05/07/2026  . Colon Cancer Screening  09/30/2027  . DEXA scan (bone density measurement)  Completed  . Hepatitis C Screening: USPSTF Recommendation to screen - Ages 50-79 yo.  Completed  . Pneumonia vaccines  Completed  . HPV Vaccine  Aged Out   Advanced directives: End of life planning; Advance aging; Advanced directives discussed.  Copy of current HCPOA/Living Will requested.    Conditions/risks identified: none new  Follow up in one year for your annual wellness visit   Preventive Care 65 Years and Older, Female Preventive care refers to lifestyle choices and visits with your health care provider that can promote health and wellness. What does preventive care include?  A yearly physical exam. This is also called an annual well check.  Dental exams once or twice a year.  Routine eye exams. Ask your health care provider how often you should have your eyes checked.  Personal lifestyle choices, including:  Daily care of your teeth and gums.  Regular physical activity.  Eating a healthy diet.  Avoiding tobacco and drug use.  Limiting alcohol use.  Practicing safe  sex.  Taking low-dose aspirin every day.  Taking vitamin and mineral supplements as recommended by your health care provider. What happens during an annual well check? The services and screenings done by your health care provider during your annual well check will depend on your age, overall health, lifestyle risk factors, and family history of disease. Counseling  Your health care provider may ask you questions about your:  Alcohol use.  Tobacco use.  Drug use.  Emotional well-being.  Home and relationship well-being.  Sexual activity.  Eating habits.  History of falls.  Memory and ability to understand (cognition).  Work and work Statistician.  Reproductive health. Screening  You may have the following tests or measurements:  Height, weight, and BMI.  Blood pressure.  Lipid and cholesterol levels. These may be checked every 5 years, or more frequently if you are over 22 years old.  Skin check.  Lung cancer screening. You may have this screening every year starting at age 48 if you have a 30-pack-year history of smoking and currently smoke or have quit within the past 15 years.  Fecal occult blood test (FOBT) of the stool. You may have this test every year starting at age 28.  Flexible sigmoidoscopy or colonoscopy. You may have a sigmoidoscopy every 5 years or a colonoscopy every 10 years starting at age 62.  Hepatitis C blood test.  Hepatitis B blood test.  Sexually transmitted disease (STD) testing.  Diabetes screening.  This is done by checking your blood sugar (glucose) after you have not eaten for a while (fasting). You may have this done every 1-3 years.  Bone density scan. This is done to screen for osteoporosis. You may have this done starting at age 19.  Mammogram. This may be done every 1-2 years. Talk to your health care provider about how often you should have regular mammograms. Talk with your health care provider about your test results,  treatment options, and if necessary, the need for more tests. Vaccines  Your health care provider may recommend certain vaccines, such as:  Influenza vaccine. This is recommended every year.  Tetanus, diphtheria, and acellular pertussis (Tdap, Td) vaccine. You may need a Td booster every 10 years.  Zoster vaccine. You may need this after age 30.  Pneumococcal 13-valent conjugate (PCV13) vaccine. One dose is recommended after age 47.  Pneumococcal polysaccharide (PPSV23) vaccine. One dose is recommended after age 69. Talk to your health care provider about which screenings and vaccines you need and how often you need them. This information is not intended to replace advice given to you by your health care provider. Make sure you discuss any questions you have with your health care provider. Document Released: 02/07/2015 Document Revised: 10/01/2015 Document Reviewed: 11/12/2014 Elsevier Interactive Patient Education  2017 [place] Prevention in the Home Falls can cause injuries. They can happen to people of all ages. There are many things you can do to make your home safe and to help prevent falls. What can I do on the outside of my home?  Regularly fix the edges of walkways and driveways and fix any cracks.  Remove anything that might make you trip as you walk through a door, such as a raised step or threshold.  Trim any bushes or trees on the path to your home.  Use bright outdoor lighting.  Clear any walking paths of anything that might make someone trip, such as rocks or tools.  Regularly check to see if handrails are loose or broken. Make sure that both sides of any steps have handrails.  Any raised decks and porches should have guardrails on the edges.  Have any leaves, snow, or ice cleared regularly.  Use sand or salt on walking paths during winter.  Clean up any spills in your garage right away. This includes oil or grease spills. What can I do in the  bathroom?  Use night lights.  Install grab bars by the toilet and in the tub and shower. Do not use towel bars as grab bars.  Use non-skid mats or decals in the tub or shower.  If you need to sit down in the shower, use a plastic, non-slip stool.  Keep the floor dry. Clean up any water that spills on the floor as soon as it happens.  Remove soap buildup in the tub or shower regularly.  Attach bath mats securely with double-sided non-slip rug tape.  Do not have throw rugs and other things on the floor that can make you trip. What can I do in the bedroom?  Use night lights.  Make sure that you have a light by your bed that is easy to reach.  Do not use any sheets or blankets that are too big for your bed. They should not hang down onto the floor.  Have a firm chair that has side arms. You can use this for support while you get dressed.  Do not have throw rugs and  other things on the floor that can make you trip. What can I do in the kitchen?  Clean up any spills right away.  Avoid walking on wet floors.  Keep items that you use a lot in easy-to-reach places.  If you need to reach something above you, use a strong step stool that has a grab bar.  Keep electrical cords out of the way.  Do not use floor polish or wax that makes floors slippery. If you must use wax, use non-skid floor wax.  Do not have throw rugs and other things on the floor that can make you trip. What can I do with my stairs?  Do not leave any items on the stairs.  Make sure that there are handrails on both sides of the stairs and use them. Fix handrails that are broken or loose. Make sure that handrails are as long as the stairways.  Check any carpeting to make sure that it is firmly attached to the stairs. Fix any carpet that is loose or worn.  Avoid having throw rugs at the top or bottom of the stairs. If you do have throw rugs, attach them to the floor with carpet tape.  Make sure that you have a  light switch at the top of the stairs and the bottom of the stairs. If you do not have them, ask someone to add them for you. What else can I do to help prevent falls?  Wear shoes that:  Do not have high heels.  Have rubber bottoms.  Are comfortable and fit you well.  Are closed at the toe. Do not wear sandals.  If you use a stepladder:  Make sure that it is fully opened. Do not climb a closed stepladder.  Make sure that both sides of the stepladder are locked into place.  Ask someone to hold it for you, if possible.  Clearly mark and make sure that you can see:  Any grab bars or handrails.  First and last steps.  Where the edge of each step is.  Use tools that help you move around (mobility aids) if they are needed. These include:  Canes.  Walkers.  Scooters.  Crutches.  Turn on the lights when you go into a dark area. Replace any light bulbs as soon as they burn out.  Set up your furniture so you have a clear path. Avoid moving your furniture around.  If any of your floors are uneven, fix them.  If there are any pets around you, be aware of where they are.  Review your medicines with your doctor. Some medicines can make you feel dizzy. This can increase your chance of falling. Ask your doctor what other things that you can do to help prevent falls. This information is not intended to replace advice given to you by your health care provider. Make sure you discuss any questions you have with your health care provider. Document Released: 11/07/2008 Document Revised: 06/19/2015 Document Reviewed: 02/15/2014 Elsevier Interactive Patient Education  2017 [employer].

## 2020-06-11 NOTE — Progress Notes (Signed)
Subjective:   Donna Rios is a 70 y.o. female who presents for Medicare Annual (Subsequent) preventive examination.  Review of Systems    No ROS.  Medicare Wellness    Cardiac Risk Factors include: advanced age (>65men, >72 women);hypertension     Objective:    Today's Vitals   06/11/20 1124  BP: 134/88  Pulse: 75  Resp: 14  Temp: (!) 97.1 F (36.2 C)  SpO2: 98%  Weight: 191 lb 12.8 oz (87 kg)  Height: 5\' 8"  (1.727 m)   Body mass index is 29.16 kg/m.  Advanced Directives 06/11/2020 06/11/2019 07/11/2018 07/07/2018 06/21/2018 06/15/2018 06/09/2018  Does Patient Have a Medical Advance Directive? No No No No No No No  Does patient want to make changes to medical advance directive? Yes (MAU/Ambulatory/Procedural Areas - Information given) - - - - - -  Would patient like information on creating a medical advance directive? - No - Patient declined No - Patient declined No - Patient declined No - Patient declined Yes (MAU/Ambulatory/Procedural Areas - Information given) Yes (MAU/Ambulatory/Procedural Areas - Information given)    Current Medications (verified) Outpatient Encounter Medications as of 06/11/2020  Medication Sig  . acetaminophen (TYLENOL) 650 MG CR tablet Take 650 mg by mouth every 8 (eight) hours as needed for pain.  (Patient not taking: Reported on 06/10/2020)  . amoxicillin (AMOXIL) 500 MG tablet Take 4 capsules (2,000 mg) one hour prior to all dental visits. (Patient not taking: Reported on 06/10/2020)  . apixaban (ELIQUIS) 5 MG TABS tablet Take 1 tablet (5 mg total) by mouth 2 (two) times daily.  . APPLE CIDER VINEGAR PO Take 1 tablet by mouth daily as needed (with fatty foods).   Marland Kitchen b complex vitamins tablet Take 1 tablet by mouth 2 (two) times a week.   . Calcium-Magnesium-Vitamin D (CALCIUM 1200+D3 PO) Take by mouth.  . cetirizine (ZYRTEC) 10 MG tablet Take 10 mg by mouth as needed.   . fluticasone (FLONASE) 50 MCG/ACT nasal spray Place 1 spray into both nostrils  daily as needed for allergies.   Marland Kitchen ketoconazole (NIZORAL) 2 % cream Apply 1 application topically daily. (Patient not taking: Reported on 06/10/2020)  . Lidocaine 4 % SOLN Apply topically as needed.  Marland Kitchen losartan (COZAAR) 25 MG tablet Take 1 tablet (25 mg total) by mouth daily.  . Menthol, Topical Analgesic, (ICY HOT EX) Apply 1 application topically 4 (four) times daily as needed (muscle pain).  (Patient not taking: Reported on 06/10/2020)  . metoprolol tartrate (LOPRESSOR) 25 MG tablet Take 1 tablet (25 mg total) by mouth 2 (two) times daily as needed (for fast heart rate >100, atrial fib).  . Multiple Vitamin (MULTIVITAMIN WITH MINERALS) TABS tablet Take 1 tablet by mouth daily. Women's Complete Multivitamin 50+  . Probiotic Product (PROBIOTIC ADVANCED PO) Take 1 tablet by mouth every other day.  Marland Kitchen Propylene Glycol (SYSTANE COMPLETE OP) Place 1 drop into both eyes 3 (three) times daily as needed (dry/irritated eyes.).   Marland Kitchen rosuvastatin (CRESTOR) 40 MG tablet TAKE 1 TABLET BY MOUTH EVERY DAY   Facility-Administered Encounter Medications as of 06/11/2020  Medication  . methylPREDNISolone acetate (DEPO-MEDROL) injection 80 mg    Allergies (verified) Ibuprofen, Asa [aspirin], and Iohexol   History: Past Medical History:  Diagnosis Date  . Allergic rhinitis   . Arthritis   . Fatty liver   . Hypertension   . Phlebitis   . S/P TAVR (transcatheter aortic valve replacement)   . Severe aortic stenosis   .  Stress incontinence   . Tobacco abuse    Past Surgical History:  Procedure Laterality Date  . ABDOMINAL HYSTERECTOMY    . BREAST BIOPSY Left    ducts removed  . Cataract surgery    . COLONOSCOPY WITH PROPOFOL N/A 09/29/2017   Procedure: COLONOSCOPY WITH PROPOFOL;  Surgeon: Lin Landsman, MD;  Location: [facility] ENDOSCOPY;  Service: Gastroenterology;  Laterality: N/A;  . EYE SURGERY    . FOOT SURGERY     7  . INTRAOPERATIVE TRANSTHORACIC ECHOCARDIOGRAM N/A 07/11/2018   Procedure:  Intraoperative Transthoracic Echocardiogram;  Surgeon: Sherren Mocha, MD;  Location: [place];  Service: Open Heart Surgery;  Laterality: N/A;  . KNEE ARTHROSCOPY Right   . LAPAROSCOPIC HYSTERECTOMY    . RIGHT HEART CATH AND CORONARY ANGIOGRAPHY N/A 06/15/2018   Procedure: RIGHT HEART CATH AND CORONARY ANGIOGRAPHY;  Surgeon: Sherren Mocha, MD;  Location: [place] CV LAB;  Service: Cardiovascular;  Laterality: N/A;  . TRANSCATHETER AORTIC VALVE REPLACEMENT, TRANSFEMORAL  07/11/2018  . TRANSCATHETER AORTIC VALVE REPLACEMENT, TRANSFEMORAL N/A 07/11/2018   Procedure: TRANSCATHETER AORTIC VALVE REPLACEMENT, TRANSFEMORAL;  Surgeon: Sherren Mocha, MD;  Location: [place];  Service: Open Heart Surgery;  Laterality: N/A;  . VARICOSE VEIN SURGERY     Family History  Problem Relation Age of Onset  . Alcoholism Other   . Arthritis Other   . Lung cancer Other   . Heart disease Other   . Stroke Other   . Hypertension Other   . Diabetes Other   . Heart failure Mother   . Hypertension Mother   . Diabetes Mother   . Heart failure Father   . Hypotension Father   . Breast cancer Neg Hx    Social History   Socioeconomic History  . Marital status: Married    Spouse name: Not on file  . Number of children: Not on file  . Years of education: Not on file  . Highest education level: Not on file  Occupational History  . Not on file  Tobacco Use  . Smoking status: Current Some Day Smoker    Packs/day: 0.50    Years: 46.00    Pack years: 23.00    Types: Cigarettes  . Smokeless tobacco: Never Used  . Tobacco comment: stopped 06/11/2018...smokes 1-10 per day  Vaping Use  . Vaping Use: Never used  Substance and Sexual Activity  . Alcohol use: Not Currently    Alcohol/week: 0.0 standard drinks  . Drug use: No  . Sexual activity: Not on file  Other Topics Concern  . Not on file  Social History Narrative  . Not on file   Social Determinants of Health   Financial Resource Strain: Low Risk   .  Difficulty of Paying Living Expenses: Not hard at all  Food Insecurity: No Food Insecurity  . Worried About Charity fundraiser in the Last Year: Never true  . Ran Out of Food in the Last Year: Never true  Transportation Needs: No Transportation Needs  . Lack of Transportation (Medical): No  . Lack of Transportation (Non-Medical): No  Physical Activity: Not on file  Stress: No Stress Concern Present  . Feeling of Stress : Not at all  Social Connections: Unknown  . Frequency of Communication with Friends and Family: Not on file  . Frequency of Social Gatherings with Friends and Family: Not on file  . Attends Religious Services: Not on file  . Active Member of Clubs or Organizations: Not on file  . Attends  Club or Organization Meetings: Not on file  . Marital Status: Married    Tobacco Counseling Ready to quit: Not Answered Counseling given: Not Answered Comment: stopped 06/11/2018...smokes 1-10 per day   Clinical Intake:  Pre-visit preparation completed: Yes        Diabetes: No  How often do you need to have someone help you when you read instructions, pamphlets, or other written materials from your doctor or pharmacy?: 1 - Never   Interpreter Needed?: No      Activities of Daily Living In your present state of health, do you have any difficulty performing the following activities: 06/11/2020  Hearing? N  Vision? N  Difficulty concentrating or making decisions? N  Walking or climbing stairs? Y  Comment Unsteady gait. Cane in use. Chronic back pain. Paces self.  Dressing or bathing? N  Doing errands, shopping? N  Preparing Food and eating ? N  Using the Toilet? N  In the past six months, have you accidently leaked urine? Y  Comment Managed with daily liner  Do you have problems with loss of bowel control? N  Managing your Medications? N  Managing your Finances? N  Housekeeping or managing your Housekeeping? N  Some recent data might be hidden    Patient Care  Team: [name], MD as PCP - General (Family Medicine) De Hollingshead, RPH-CPP as Pharmacist (Pharmacist)  Indicate any recent Medical Services you may have received from other than [facility] providers in the past year (date may be approximate).     Assessment:   This is a routine wellness examination for Jimmya.  Hearing/Vision screen  Hearing Screening   125Hz  250Hz  500Hz  1000Hz  2000Hz  3000Hz  4000Hz  6000Hz  8000Hz   Right ear:           Left ear:           Comments: Patient is able to hear conversational tones without difficulty.  No issues reported.  Vision Screening Comments: Wears corrective lenses when reading  Cataract extraction, bilateral  Visual acuity not assessed, virtual visit. They have seen their ophthalmologist in the last 12 months.  Dietary issues and exercise activities discussed: Current Exercise Habits: Home exercise routine, Type of exercise: walking;stretching, Time (Minutes): 20, Frequency (Times/Week): 3, Weekly Exercise (Minutes/Week): 60, Intensity: Mild  Goals Addressed              This Visit's Progress     Patient Stated   .  COMPLETED: I should wear my glasses more (pt-stated)      .  Increase physical activity (pt-stated)        Depression Screen PHQ 2/9 Scores 06/11/2020 09/18/2019 06/11/2019 01/29/2019 11/27/2018 10/16/2018 06/07/2017  PHQ - 2 Score 0 0 0 0 0 0 0    Fall Risk Fall Risk  06/11/2020 09/18/2019 07/03/2019 06/11/2019 01/29/2019  Falls in the past year? 0 1 - 1 0  Number falls in past yr: 0 1 - 1 0  Injury with Fall? 0 1 - 1 0  Comment - - - Bruised bilateral knees, right elbow, right pinky -  Follow up Falls evaluation completed Falls evaluation completed Falls evaluation completed Falls evaluation completed Falls evaluation completed    [place]: Handrails in use when climbing stairs? Yes Home free of loose throw rugs in walkways, pet beds, electrical cords, etc? Yes  Adequate lighting in  your home to reduce risk of falls? Yes   ASSISTIVE DEVICES UTILIZED TO PREVENT FALLS: Life alert? No  Use of a cane, walker or w/c? Yes  Grab bars in the bathroom? Yes   Shower chair or bench in shower? Yes   Elevated toilet seat or a handicapped toilet? Yes   TIMED UP AND GO: Was the test performed? Yes .  Length of time to ambulate 10 feet: 18 sec.   Gait slow and steady with assistive device  Cognitive Function: Patient is alert and oriented x3 Denies difficulty focusing, making decisions, memory loss.  Enjoys brain health activities  MMSE - Mini Mental State Exam 06/07/2017 05/06/2016  Orientation to time 5 5  Orientation to Place 5 5  Registration 3 3  Attention/ Calculation 5 5  Recall 3 2  Language- name 2 objects 2 2  Language- repeat 1 1  Language- follow 3 step command 3 3  Language- read & follow direction 1 1  Write a sentence 1 1  Copy design 1 1  Total score 30 29     6CIT Screen 06/11/2020 06/11/2019 06/09/2018  What Year? 0 points 0 points 0 points  What month? 0 points 0 points 0 points  What time? 0 points 0 points 0 points  Count back from 20 0 points 0 points 0 points  Months in reverse 0 points 0 points 0 points  Repeat phrase - 0 points 0 points  Total Score - 0 0    Immunizations Immunization History  Administered Date(s) Administered  . Fluad Quad(high Dose 65+) 10/09/2018  . Influenza, High Dose Seasonal PF 11/06/2015, 10/06/2016, 11/06/2019  . Influenza-Unspecified 10/05/2017  . Moderna SARS-COV2 Booster Vaccination 12/20/2019  . Moderna Sars-Covid-2 Vaccination 04/18/2019, 05/19/2019  . Pneumococcal Conjugate-13 05/06/2016  . Pneumococcal Polysaccharide-23 06/07/2017, 08/15/2019  . Tdap 05/06/2016  . Zoster Recombinat (Shingrix) 08/15/2019, 10/18/2019    Health Maintenance Health Maintenance  Topic Date Due  . COVID-19 Vaccine (4 - Booster for Moderna series) 03/21/2020  . INFLUENZA VACCINE  08/25/2020  . MAMMOGRAM  11/07/2021  .  TETANUS/TDAP  05/07/2026  . COLONOSCOPY (Pts 45-57yrs Insurance coverage will need to be confirmed)  09/30/2027  . DEXA SCAN  Completed  . Hepatitis C Screening  Completed  . PNA vac Low Risk Adult  Completed  . HPV VACCINES  Aged Out   Colorectal cancer screening: Type of screening: Colonoscopy. Completed 09/29/17. Repeat every 10 years  Mammogram status: Completed 11/08/19. Repeat every year  Bone density- 11/08/19.  Lung Cancer Screening: (Low Dose CT Chest recommended). Last completed 11/01/19.  Vision Screening: Recommended annual ophthalmology exams for early detection of glaucoma and other disorders of the eye. Is the patient up to date with their annual eye exam?  Yes   Dental Screening: Recommended annual dental exams for proper oral hygiene.  Community Resource Referral / Chronic Care Management: CRR required this visit?  Yes   CCM required this visit?  Yes      Plan:   Keep all routine maintenance appointments.   I have personally reviewed and noted the following in the patient's chart:   . Medical and social history . Use of alcohol, tobacco or illicit drugs  . Current medications and supplements including opioid prescriptions. Patient does not take opioids.  . Functional ability and status . Nutritional status . Physical activity . Advanced directives . List of other physicians . Hospitalizations, surgeries, and ER visits in previous 12 months . Vitals . Screenings to include cognitive, depression, and falls . Referrals and appointments  In addition, I have reviewed and discussed with patient certain preventive  protocols, quality metrics, and best practice recommendations. A written personalized care plan for preventive services as well as general preventive health recommendations were provided to patient.     Varney Biles, LPN   10/09/7827

## 2020-06-17 NOTE — Progress Notes (Signed)
Donna Rios - 70 y.o. female MRN [id number]  Date of birth: 27-Nov-1950  Office Visit Note: Visit Date: 05/29/2020 PCP: [name], MD Referred by: [name], MD  Subjective: Chief Complaint  Patient presents with  . Lower Back - Pain   HPI:  Donna RIBAUDO is a 70 y.o. female who comes in today at the request of Dr. Anderson Malta for planned Left L4-L5 Lumbar Transforaminal epidural steroid injection with fluoroscopic guidance.  The patient has failed conservative care including home exercise, medications, time and activity modification.  This injection will be diagnostic and hopefully therapeutic.  Please see requesting physician notes for further details and justification. MRI reviewed with images and spine model.  MRI reviewed in the note below.  Prior hip injection did seem to help to some degree but she still having pain in the left lower back and thigh.  Hurts walking and riding in the car.    ROS Otherwise per HPI.  Assessment & Plan: Visit Diagnoses:    ICD-10-CM   1. Lumbar radiculopathy  M54.16 XR C-ARM NO REPORT    Epidural Steroid injection    methylPREDNISolone acetate (DEPO-MEDROL) injection 80 mg    Plan: No additional findings.   Meds & Orders:  Meds ordered this encounter  Medications  . methylPREDNISolone acetate (DEPO-MEDROL) injection 80 mg    Orders Placed This Encounter  Procedures  . XR C-ARM NO REPORT  . Epidural Steroid injection    Follow-up: Return for  Anderson Malta, MD.   Procedures: No procedures performed  Lumbosacral Transforaminal Epidural Steroid Injection - Sub-Pedicular Approach with Fluoroscopic Guidance  Patient: Donna Rios      Date of Birth: [birth year] MRN: [id number] PCP: [name], MD      Visit Date: 05/29/2020   Universal Protocol:    Date/Time: 05/29/2020  Consent Given By: the patient  Position: PRONE  Additional Comments: Vital signs were monitored before and after the  procedure. Patient was prepped and draped in the usual sterile fashion. The correct patient, procedure, and site was verified.   Injection Procedure Details:   Procedure diagnoses: Lumbar radiculopathy [M54.16]    Meds Administered:  Meds ordered this encounter  Medications  . methylPREDNISolone acetate (DEPO-MEDROL) injection 80 mg    Laterality: Left  Location/Site:  L4-L5  Needle:5.0 in., 22 ga.  Short bevel or Quincke spinal needle  Needle Placement: Transforaminal  Findings:    -Comments: Excellent flow of contrast along the nerve, nerve root and into the epidural space.  Procedure Details: After squaring off the end-plates to get a true AP view, the C-arm was positioned so that an oblique view of the foramen as noted above was visualized. The target area is just inferior to the "nose of the scotty dog" or sub pedicular. The soft tissues overlying this structure were infiltrated with 2-3 ml. of 1% Lidocaine without Epinephrine.  The spinal needle was inserted toward the target using a "trajectory" view along the fluoroscope beam.  Under AP and lateral visualization, the needle was advanced so it did not puncture dura and was located close the 6 O'Clock position of the pedical in AP tracterory. Biplanar projections were used to confirm position. Aspiration was confirmed to be negative for CSF and/or blood. A 1-2 ml. volume of Isovue-250 was injected and flow of contrast was noted at each level. Radiographs were obtained for documentation purposes.   After attaining the desired flow of contrast documented above, a 0.5  to 1.0 ml test dose of 0.25% Marcaine was injected into each respective transforaminal space.  The patient was observed for 90 seconds post injection.  After no sensory deficits were reported, and normal lower extremity motor function was noted,   the above injectate was administered so that equal amounts of the injectate were placed at each foramen (level) into the  transforaminal epidural space.   Additional Comments:  The patient tolerated the procedure well Dressing: 2 x 2 sterile gauze and Band-Aid    Post-procedure details: Patient was observed during the procedure. Post-procedure instructions were reviewed.  Patient left the clinic in stable condition.      Clinical History: No specialty comments available.     Objective:  VS:  HT:    WT:   BMI:     BP:(!) 168/96  HR:87bpm  TEMP: ( )  RESP:  Physical Exam Vitals and nursing note reviewed.  Constitutional:      General: She is not in acute distress.    Appearance: Normal appearance. She is not ill-appearing.  HENT:     Head: Normocephalic and atraumatic.     Right Ear: External ear normal.     Left Ear: External ear normal.  Eyes:     Extraocular Movements: Extraocular movements intact.  Cardiovascular:     Rate and Rhythm: Normal rate.     Pulses: Normal pulses.  Pulmonary:     Effort: Pulmonary effort is normal. No respiratory distress.  Abdominal:     General: There is no distension.     Palpations: Abdomen is soft.  Musculoskeletal:        General: Tenderness present.     Cervical back: Neck supple.     Right lower leg: No edema.     Left lower leg: No edema.     Comments: Patient has good distal strength with no pain over the greater trochanters.  No clonus or focal weakness.  Skin:    Findings: No erythema, lesion or rash.  Neurological:     General: No focal deficit present.     Mental Status: She is alert and oriented to person, place, and time.     Sensory: No sensory deficit.     Motor: No weakness or abnormal muscle tone.     Coordination: Coordination normal.  Psychiatric:        Mood and Affect: Mood normal.        Behavior: Behavior normal.      Imaging: No results found.

## 2020-06-17 NOTE — Procedures (Signed)
Lumbosacral Transforaminal Epidural Steroid Injection - Sub-Pedicular Approach with Fluoroscopic Guidance  Patient: Donna Rios      Date of Birth: February 06, 1950 MRN: [id number] PCP: [name], MD      Visit Date: 05/29/2020   Universal Protocol:    Date/Time: 05/29/2020  Consent Given By: the patient  Position: PRONE  Additional Comments: Vital signs were monitored before and after the procedure. Patient was prepped and draped in the usual sterile fashion. The correct patient, procedure, and site was verified.   Injection Procedure Details:   Procedure diagnoses: Lumbar radiculopathy [M54.16]    Meds Administered:  Meds ordered this encounter  Medications  . methylPREDNISolone acetate (DEPO-MEDROL) injection 80 mg    Laterality: Left  Location/Site:  L4-L5  Needle:5.0 in., 22 ga.  Short bevel or Quincke spinal needle  Needle Placement: Transforaminal  Findings:    -Comments: Excellent flow of contrast along the nerve, nerve root and into the epidural space.  Procedure Details: After squaring off the end-plates to get a true AP view, the C-arm was positioned so that an oblique view of the foramen as noted above was visualized. The target area is just inferior to the "nose of the scotty dog" or sub pedicular. The soft tissues overlying this structure were infiltrated with 2-3 ml. of 1% Lidocaine without Epinephrine.  The spinal needle was inserted toward the target using a "trajectory" view along the fluoroscope beam.  Under AP and lateral visualization, the needle was advanced so it did not puncture dura and was located close the 6 O'Clock position of the pedical in AP tracterory. Biplanar projections were used to confirm position. Aspiration was confirmed to be negative for CSF and/or blood. A 1-2 ml. volume of Isovue-250 was injected and flow of contrast was noted at each level. Radiographs were obtained for documentation purposes.   After attaining the  desired flow of contrast documented above, a 0.5 to 1.0 ml test dose of 0.25% Marcaine was injected into each respective transforaminal space.  The patient was observed for 90 seconds post injection.  After no sensory deficits were reported, and normal lower extremity motor function was noted,   the above injectate was administered so that equal amounts of the injectate were placed at each foramen (level) into the transforaminal epidural space.   Additional Comments:  The patient tolerated the procedure well Dressing: 2 x 2 sterile gauze and Band-Aid    Post-procedure details: Patient was observed during the procedure. Post-procedure instructions were reviewed.  Patient left the clinic in stable condition.

## 2020-06-27 ENCOUNTER — Other Ambulatory Visit: Payer: Self-pay

## 2020-06-29 IMAGING — CR CHEST - 2 VIEW
2 series · 2 of 2 positions shown · non-contrast
Comparison: 06/21/2018

CLINICAL DATA: Preop evaluation for upcoming TAVR, history of
aortic stenosis

EXAM:
CHEST - 2 VIEW

[w chest pa]
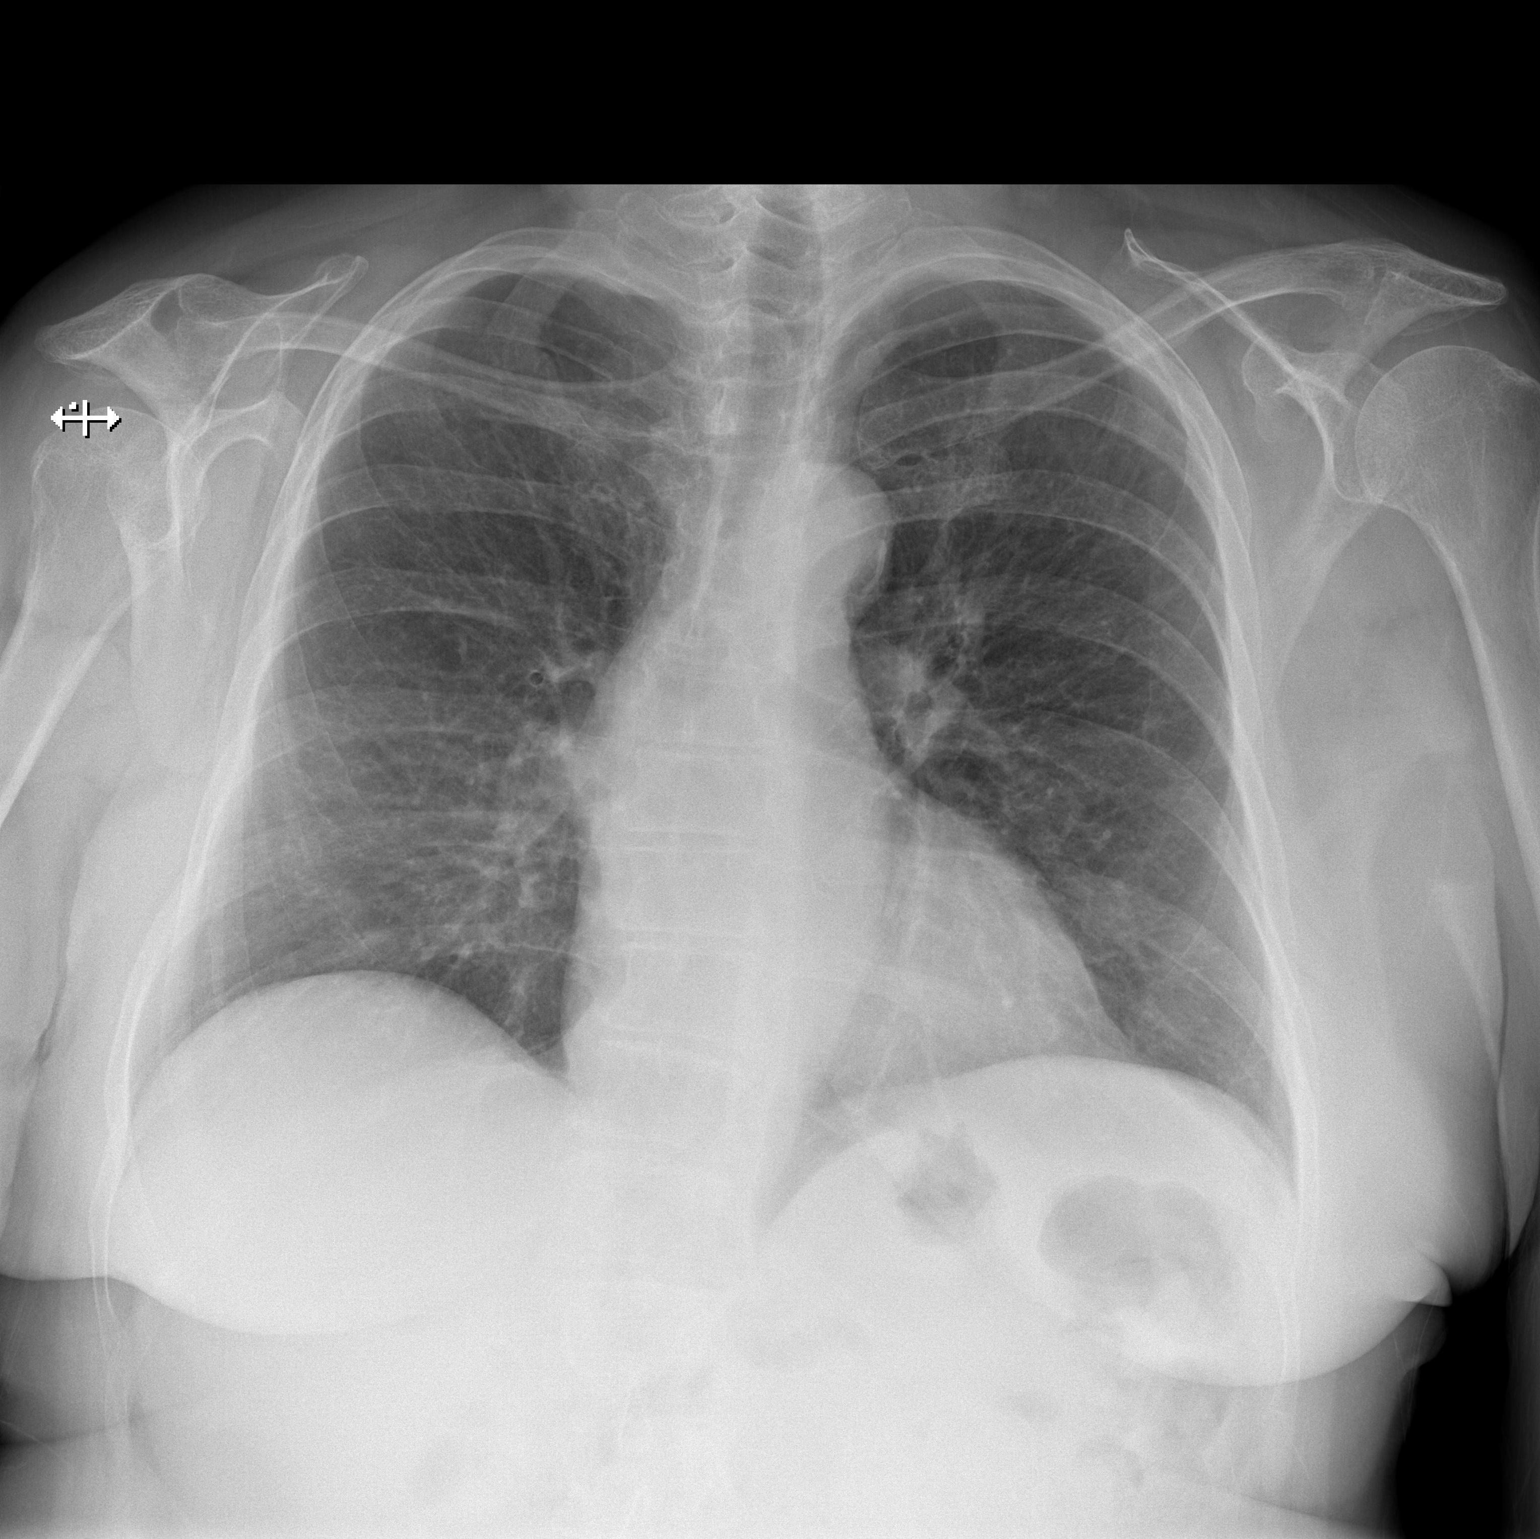

[w chest lat]
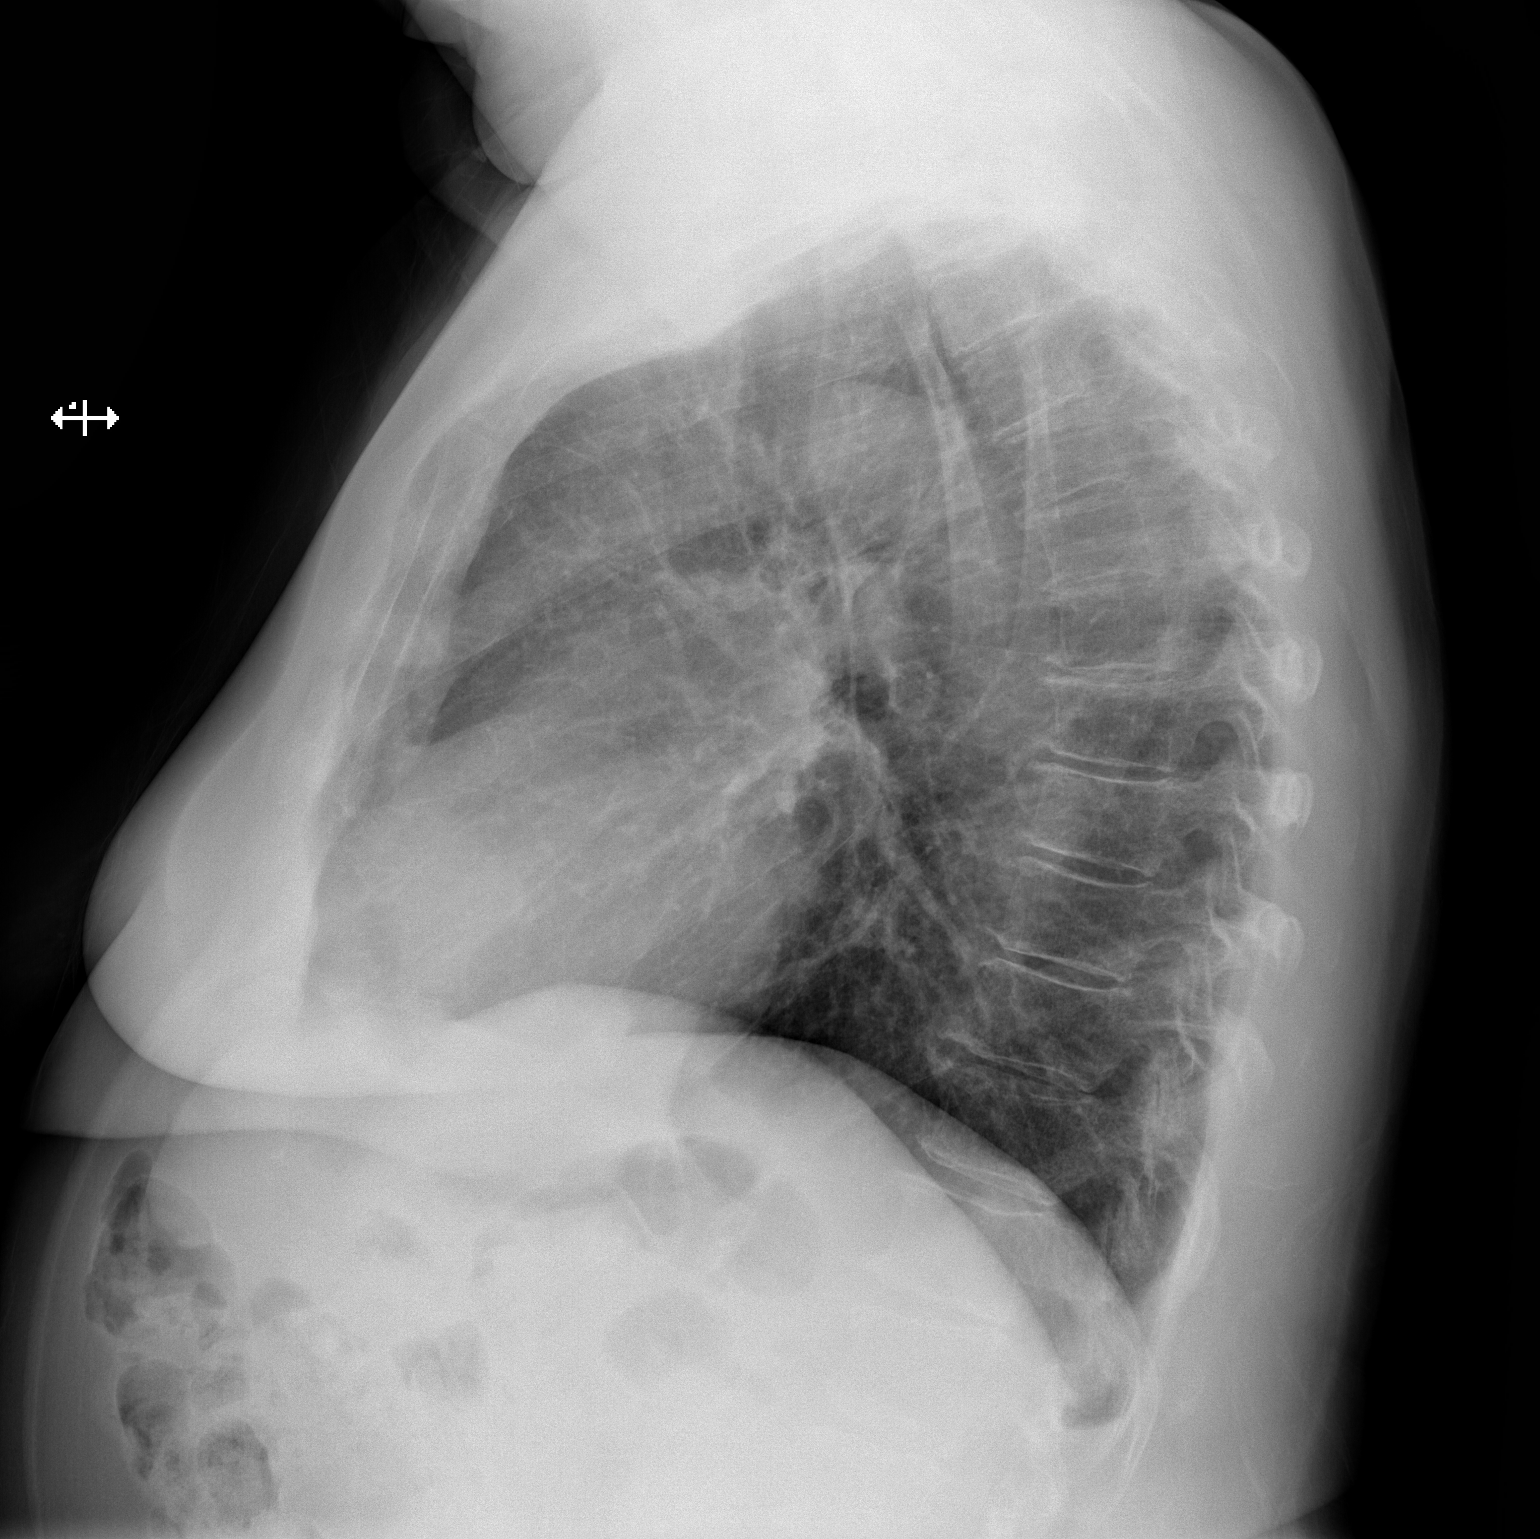

[2 of 2 positions shown; findings below may reference images not displayed]

FINDINGS: Cardiac shadows within normal limits. Aortic calcifications are
seen. The lungs are well aerated bilaterally without focal
infiltrate or sizable effusion. No acute bony abnormality is noted.
IMPRESSION: No acute abnormality seen.

## 2020-07-04 ENCOUNTER — Ambulatory Visit: Payer: Medicare PPO | Admitting: Family Medicine

## 2020-07-04 ENCOUNTER — Other Ambulatory Visit: Payer: Self-pay

## 2020-07-04 DIAGNOSIS — E782 Mixed hyperlipidemia: Secondary | ICD-10-CM | POA: Diagnosis not present

## 2020-07-04 DIAGNOSIS — I10 Essential (primary) hypertension: Secondary | ICD-10-CM | POA: Diagnosis not present

## 2020-07-04 DIAGNOSIS — K5900 Constipation, unspecified: Secondary | ICD-10-CM | POA: Diagnosis not present

## 2020-07-04 DIAGNOSIS — M5442 Lumbago with sciatica, left side: Secondary | ICD-10-CM | POA: Diagnosis not present

## 2020-07-04 DIAGNOSIS — I872 Venous insufficiency (chronic) (peripheral): Secondary | ICD-10-CM

## 2020-07-04 DIAGNOSIS — G8929 Other chronic pain: Secondary | ICD-10-CM | POA: Diagnosis not present

## 2020-07-04 DIAGNOSIS — I480 Paroxysmal atrial fibrillation: Secondary | ICD-10-CM | POA: Insufficient documentation

## 2020-07-04 LAB — COMPREHENSIVE METABOLIC PANEL
ALT: 15 U/L (ref 0–35)
AST: 16 U/L (ref 0–37)
Albumin: 4.4 g/dL (ref 3.5–5.2)
Alkaline Phosphatase: 77 U/L (ref 39–117)
BUN: 12 mg/dL (ref 6–23)
CO2: 29 mEq/L (ref 19–32)
Calcium: 9.4 mg/dL (ref 8.4–10.5)
Chloride: 105 mEq/L (ref 96–112)
Creatinine, Ser: 0.75 mg/dL (ref 0.40–1.20)
GFR: 80.98 mL/min (ref >60.00)
Glucose, Bld: 93 mg/dL (ref 70–99)
Potassium: 4.4 mEq/L (ref 3.5–5.1)
Sodium: 142 mEq/L (ref 135–145)
Total Bilirubin: 0.5 mg/dL (ref 0.2–1.2)
Total Protein: 6.6 g/dL (ref 6.0–8.3)

## 2020-07-04 LAB — LIPID PANEL
Cholesterol: 138 mg/dL (ref 0–200)
HDL: 52.7 mg/dL (ref >39.00)
LDL Cholesterol: 51 mg/dL (ref 0–99)
NonHDL: 85.53
Total CHOL/HDL Ratio: 3
Triglycerides: 174 mg/dL — ABNORMAL HIGH (ref 0.0–149.0)
VLDL: 34.8 mg/dL (ref 0.0–40.0)

## 2020-07-04 LAB — CBC
HCT: 39.5 % (ref 36.0–46.0)
Hemoglobin: 13.7 g/dL (ref 12.0–15.0)
MCHC: 34.6 g/dL (ref 30.0–36.0)
MCV: 92.7 fl (ref 78.0–100.0)
Platelets: 180 10*3/uL (ref 150.0–400.0)
RBC: 4.26 Mil/uL (ref 3.87–5.11)
RDW: 13.2 % (ref 11.5–15.5)
WBC: 10.1 10*3/uL (ref 4.0–10.5)

## 2020-07-04 NOTE — Progress Notes (Signed)
[name], MD Phone: [phone]  Donna Rios is a 70 y.o. female who presents today for f/u.  HYPERTENSION Disease Monitoring Home BP Monitoring <130/80 mostly Chest pain- no    Dyspnea- no Medications Compliance-  taking losartan 25 mg daily.  Edema- chronic and stable related to venous insufficiency   HYPERLIPIDEMIA Symptoms Chest pain on exertion:  no    Medications: Compliance- taking crestor Right upper quadrant pain- no   Atrial fibrillation: This is paroxysmal A. fib.  She had palpitations after taking prednisone for her back though those resolved.  She has been on Eliquis with no bleeding.  She saw cardiology and they recommended her using metoprolol on an as-needed basis.  Chronic back pain: Patient notes this has improved with injections though she still has some symptoms certain days.  Constipation: Patient noted constipation after having her back injections.  Notes she was not going daily.  She took Dulcolax and that did help her improve some though she is still not having daily bowel movements.  She tries to get plenty of fiber in her diet.    Social History   Tobacco Use  Smoking Status Some Days   Packs/day: 0.50   Years: 46.00   Pack years: 23.00   Types: Cigarettes  Smokeless Tobacco Never  Tobacco Comments   stopped 06/11/2018...smokes 1-10 per day    Current Outpatient Medications on File Prior to Visit  Medication Sig Dispense Refill   apixaban (ELIQUIS) 5 MG TABS tablet Take 1 tablet (5 mg total) by mouth 2 (two) times daily. 180 tablet 3   APPLE CIDER VINEGAR PO Take 1 tablet by mouth daily as needed (with fatty foods).      b complex vitamins tablet Take 1 tablet by mouth 2 (two) times a week.      Calcium-Magnesium-Vitamin D (CALCIUM 1200+D3 PO) Take by mouth.     cetirizine (ZYRTEC) 10 MG tablet Take 10 mg by mouth as needed.      fluticasone (FLONASE) 50 MCG/ACT nasal spray Place 1 spray into both nostrils daily as needed for  allergies.      Lidocaine 4 % SOLN Apply topically as needed.     losartan (COZAAR) 25 MG tablet Take 1 tablet (25 mg total) by mouth daily. 90 tablet 3   metoprolol tartrate (LOPRESSOR) 25 MG tablet Take 1 tablet (25 mg total) by mouth 2 (two) times daily as needed (for fast heart rate >100, atrial fib). 60 tablet 3   Multiple Vitamin (MULTIVITAMIN WITH MINERALS) TABS tablet Take 1 tablet by mouth daily. Women's Complete Multivitamin 50+     Probiotic Product (PROBIOTIC ADVANCED PO) Take 1 tablet by mouth every other day.     Propylene Glycol (SYSTANE COMPLETE OP) Place 1 drop into both eyes 3 (three) times daily as needed (dry/irritated eyes.).      rosuvastatin (CRESTOR) 40 MG tablet TAKE 1 TABLET BY MOUTH EVERY DAY 90 tablet 2   No current facility-administered medications on file prior to visit.     ROS see history of present illness  Objective  Physical Exam Vitals:   07/04/20 1002  BP: 130/80  Pulse: 78  Temp: 98.5 F (36.9 C)  SpO2: 99%    BP Readings from Last 3 Encounters:  07/04/20 130/80  06/11/20 134/88  05/29/20 (!) 168/96   Wt Readings from Last 3 Encounters:  07/04/20 195 lb (88.5 kg)  06/11/20 191 lb 12.8 oz (87 kg)  04/30/20 199 lb (90.3 kg)  Physical Exam Constitutional:      General: She is not in acute distress.    Appearance: She is not diaphoretic.  Cardiovascular:     Rate and Rhythm: Normal rate and regular rhythm.     Heart sounds: Normal heart sounds.  Pulmonary:     Effort: Pulmonary effort is normal.     Breath sounds: Normal breath sounds.  Abdominal:     General: Bowel sounds are normal. There is no distension.     Palpations: Abdomen is soft.     Tenderness: There is no abdominal tenderness. There is no guarding or rebound.  Skin:    General: Skin is warm and dry.  Neurological:     Mental Status: She is alert.     Assessment/Plan: Please see individual problem list.  Problem List Items Addressed This Visit     Venous  insufficiency    The patient has difficulty wearing her compression stockings when it is hot outside.  I encouraged her to wear these as she is able to.       Hyperlipidemia    Continue Crestor 40 mg once daily.  Check lab work.       Relevant Orders   Lipid panel   Comp Met (CMET)   Hypertension    Well-controlled.  She will continue losartan 25 mg once daily.  Check labs today.       Relevant Orders   Comp Met (CMET)   Chronic low back pain    Status post injections.  Some improvement.  She will continue to see her specialist.       Paroxysmal atrial fibrillation ([place])    Sinus rhythm today.  She will continue Eliquis 5 mg twice daily.  She can use the metoprolol as needed as advised by her cardiologist.       Relevant Orders   CBC   Constipation    Benign exam.  Discussed she could try adding MiraLAX on a daily basis to see if that would provide benefit.        Return in about 4 months (around 11/03/2020) for Hypertension.  This visit occurred during the SARS-CoV-2 public health emergency.  Safety protocols were in place, including screening questions prior to the visit, additional usage of staff PPE, and extensive cleaning of exam room while observing appropriate contact time as indicated for disinfecting solutions.    [name], MD [place]

## 2020-07-04 NOTE — Assessment & Plan Note (Signed)
The patient has difficulty wearing her compression stockings when it is hot outside.  I encouraged her to wear these as she is able to.

## 2020-07-04 NOTE — Patient Instructions (Signed)
Nice to see you. We will check lab work today. Please continue to monitor your blood pressure.

## 2020-07-04 NOTE — Assessment & Plan Note (Signed)
Well-controlled.  She will continue losartan 25 mg once daily.  Check labs today.

## 2020-07-04 NOTE — Assessment & Plan Note (Signed)
Status post injections.  Some improvement.  She will continue to see her specialist.

## 2020-07-04 NOTE — Assessment & Plan Note (Signed)
Sinus rhythm today.  She will continue Eliquis 5 mg twice daily.  She can use the metoprolol as needed as advised by her cardiologist.

## 2020-07-04 NOTE — Assessment & Plan Note (Signed)
Continue Crestor 40 mg once daily.  Check lab work.

## 2020-07-04 NOTE — Assessment & Plan Note (Signed)
Benign exam.  Discussed she could try adding MiraLAX on a daily basis to see if that would provide benefit.

## 2020-07-15 ENCOUNTER — Ambulatory Visit: Payer: Medicare PPO | Admitting: Neurology

## 2020-07-15 ENCOUNTER — Encounter: Payer: Self-pay | Admitting: Neurology

## 2020-07-15 VITALS — BP 130/80 | HR 75 | Ht 68.5 in | Wt 192.0 lb

## 2020-07-15 DIAGNOSIS — G5603 Carpal tunnel syndrome, bilateral upper limbs: Secondary | ICD-10-CM | POA: Diagnosis not present

## 2020-07-15 NOTE — Progress Notes (Signed)
[facility]    Provider:  Dr Jaynee Eagles Requesting Provider: [name], MD Primary Care Provider:  [name], MD  CC: Tingling in bilateral hands  HPI:  Donna Rios is a 70 y.o. female here as requested by [name], MD for tingling in the fingers.  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  She had tingling in her fingers, started before she retired, started with numbness, the carpal tunnel braces helped in the past(years ago), all her fingers help, she feels her skin is dry, lotion doesn't help, in all the fingers, in 202 the symptoms came back after her cardiac surgery. She thought it was over washing hands. Worse with driving they felt numb, progressive slowly, she has occ dropped things especially when puffing on a cigarette, not worse at night or morning but she tries very hard not to position them wrong. She denies neck pain and radicular symptoms. Both hands, pretty symmetrical, very annoying also mildly painful. If she goes to sleep positionally wrong she akes with the symptoms.   Reviewed notes, labs and imaging from outside physicians, which showed:  July 04, 2020: CBC and CMP normal  Review of Systems: Patient complains of symptoms per HPI as well as the following symptoms joint pain, hand pain. Pertinent negatives and positives per HPI. All others negative.   Social History   Socioeconomic History   Marital status: Married    Spouse name: Not on file   Number of children: Not on file   Years of education: Not on file   Highest education level: Not on file  Occupational History   Not on file  Tobacco Use   Smoking status: Some Days    Packs/day: 0.50    Years: 46.00    Pack years: 23.00    Types: Cigarettes   Smokeless tobacco: Never   Tobacco comments:    stopped 06/11/2018...smokes 1-10 per day  Vaping Use   Vaping Use: Never  used  Substance and Sexual Activity   Alcohol use: Not Currently    Alcohol/week: 0.0 standard drinks   Drug use: No   Sexual activity: Not on file  Other Topics Concern   Not on file  Social History Narrative   Not on file   Social Determinants of Health   Financial Resource Strain: Low Risk    Difficulty of Paying Living Expenses: Not hard at all  Food Insecurity: No Food Insecurity   Worried About Charity fundraiser in the Last Year: Never true   [place] in the Last Year: Never true  Transportation Needs: No Transportation Needs   Lack of Transportation (Medical): No   Lack of Transportation (Non-Medical): No  Physical Activity: Not on file  Stress: No Stress Concern Present   Feeling of Stress : Not at all  Social Connections: Unknown   Frequency of Communication with Friends and Family: Not on file   Frequency of Social Gatherings with Friends and Family: Not on file   Attends Religious Services: Not on file   Active Member of Clubs or Organizations: Not on file   Attends Archivist Meetings: Not on file   Marital Status: Married  Human resources officer Violence: Not At Risk   Fear of Current or Ex-Partner: No   Emotionally Abused: No   Physically Abused: No   Sexually Abused: No    Family History  Problem Relation Age of  Onset   Alcoholism Other    Arthritis Other    Lung cancer Other    Heart disease Other    Stroke Other    Hypertension Other    Diabetes Other    Heart failure Mother    Hypertension Mother    Diabetes Mother    Heart failure Father    Hypotension Father    Breast cancer Neg Hx     Past Medical History:  Diagnosis Date   Allergic rhinitis    Arthritis    Fatty liver    Hypertension    Phlebitis    S/P TAVR (transcatheter aortic valve replacement)    Severe aortic stenosis    Stress incontinence    Tobacco abuse     Patient Active Problem List   Diagnosis Date Noted   Paroxysmal atrial fibrillation ([place])  07/04/2020   Constipation 07/04/2020   Elevated pulse rate 04/02/2020   Aortic atherosclerosis ([place]) 03/21/2020   Chronic low back pain 12/19/2019   Hand tingling 12/19/2019   Encounter for general adult medical examination with abnormal findings 09/18/2019   Postmenopausal estrogen deficiency 09/18/2019   Nicotine dependence, cigarettes, uncomplicated 16/10/9602   Falls 06/11/2019   Injury of little finger 06/11/2019   Chronic pain of both knees 06/11/2019   Itching 03/27/2019   Hypertension 11/28/2018   Hair loss 10/21/2018   Headache 07/18/2018   S/P TAVR (transcatheter aortic valve replacement)    GERD (gastroesophageal reflux disease) 06/13/2018   Foot pain, bilateral 02/10/2018   Severe aortic valve stenosis 08/16/2017   Chronic left hip pain 08/10/2017   Rotator cuff impingement syndrome of left shoulder 08/10/2017   Anxiety 05/09/2017   Lesion of right eyelid 04/01/2017   Hyperlipidemia 05/07/2016   Prediabetes 11/06/2015   Urine frequency 08/15/2015   Venous insufficiency 06/11/2015   Stress incontinence 06/11/2015   Squamous cell carcinoma of skin 06/11/2015   Former smoker 06/11/2015   Chronic headaches 06/11/2015    Past Surgical History:  Procedure Laterality Date   ABDOMINAL HYSTERECTOMY     BREAST BIOPSY Left    ducts removed   Cataract surgery     COLONOSCOPY WITH PROPOFOL N/A 09/29/2017   Procedure: COLONOSCOPY WITH PROPOFOL;  Surgeon: Lin Landsman, MD;  Location: [facility];  Service: Gastroenterology;  Laterality: N/A;   EYE SURGERY     FOOT SURGERY     7   INTRAOPERATIVE TRANSTHORACIC ECHOCARDIOGRAM N/A 07/11/2018   Procedure: Intraoperative Transthoracic Echocardiogram;  Surgeon: Sherren Mocha, MD;  Location: [place];  Service: Open Heart Surgery;  Laterality: N/A;   KNEE ARTHROSCOPY Right    LAPAROSCOPIC HYSTERECTOMY     RIGHT HEART CATH AND CORONARY ANGIOGRAPHY N/A 06/15/2018   Procedure: RIGHT HEART CATH AND CORONARY ANGIOGRAPHY;   Surgeon: Sherren Mocha, MD;  Location: [facility];  Service: Cardiovascular;  Laterality: N/A;   TRANSCATHETER AORTIC VALVE REPLACEMENT, TRANSFEMORAL  07/11/2018   TRANSCATHETER AORTIC VALVE REPLACEMENT, TRANSFEMORAL N/A 07/11/2018   Procedure: TRANSCATHETER AORTIC VALVE REPLACEMENT, TRANSFEMORAL;  Surgeon: Sherren Mocha, MD;  Location: [place];  Service: Open Heart Surgery;  Laterality: N/A;   VARICOSE VEIN SURGERY      Current Outpatient Medications  Medication Sig Dispense Refill   apixaban (ELIQUIS) 5 MG TABS tablet Take 1 tablet (5 mg total) by mouth 2 (two) times daily. 180 tablet 3   APPLE CIDER VINEGAR PO Take 1 tablet by mouth daily as needed (with fatty foods).      b complex vitamins tablet  Take 1 tablet by mouth 2 (two) times a week.      Calcium-Magnesium-Vitamin D (CALCIUM 1200+D3 PO) Take by mouth.     cetirizine (ZYRTEC) 10 MG tablet Take 10 mg by mouth as needed.      fluticasone (FLONASE) 50 MCG/ACT nasal spray Place 1 spray into both nostrils daily as needed for allergies.      Lidocaine 4 % SOLN Apply topically as needed.     losartan (COZAAR) 25 MG tablet Take 1 tablet (25 mg total) by mouth daily. 90 tablet 3   metoprolol tartrate (LOPRESSOR) 25 MG tablet Take 1 tablet (25 mg total) by mouth 2 (two) times daily as needed (for fast heart rate >100, atrial fib). 60 tablet 3   Multiple Vitamin (MULTIVITAMIN WITH MINERALS) TABS tablet Take 1 tablet by mouth daily. Women's Complete Multivitamin 50+     Probiotic Product (PROBIOTIC ADVANCED PO) Take 1 tablet by mouth every other day.     Propylene Glycol (SYSTANE COMPLETE OP) Place 1 drop into both eyes 3 (three) times daily as needed (dry/irritated eyes.).      rosuvastatin (CRESTOR) 40 MG tablet TAKE 1 TABLET BY MOUTH EVERY DAY 90 tablet 2   No current facility-administered medications for this visit.    Allergies as of 07/15/2020 - Review Complete 07/15/2020  Allergen Reaction Noted   Ibuprofen Other (See  Comments) 09/17/2015   Asa [aspirin] Other (See Comments) 06/11/2015   Iohexol Rash 06/23/2018    Vitals: BP 130/80   Pulse 75   Ht 5' 8.5" (1.74 m)   Wt 192 lb (87.1 kg)   BMI 28.77 kg/m  Last Weight:  Wt Readings from Last 1 Encounters:  07/15/20 192 lb (87.1 kg)   Last Height:   Ht Readings from Last 1 Encounters:  07/15/20 5' 8.5" (1.74 m)     Physical exam: Exam: Gen: NAD, conversant, well nourised, well groomed                     CV: RRR, no MRG. No Carotid Bruits. No peripheral edema, warm, nontender Eyes: Conjunctivae clear without exudates or hemorrhage  Neuro: Detailed Neurologic Exam  Speech:    Speech is normal; fluent and spontaneous with normal comprehension.  Cognition:    The patient is oriented to person, place, and time;     recent and remote memory intact;     language fluent;     normal attention, concentration,     fund of knowledge Cranial Nerves:    The pupils are equal, round, and reactive to light.  Pupils too small to visualize fundi. Visual fields are full to finger confrontation. Extraocular movements are intact. Trigeminal sensation is intact and the muscles of mastication are normal. The face is symmetric. The palate elevates in the midline. Hearing intact. Voice is normal. Shoulder shrug is normal. The tongue has normal motion without fasciculations.   Coordination:    Normal   Gait:    normal.   Motor Observation:    No asymmetry, no atrophy, and no involuntary movements noted. Tone:    Normal muscle tone.    Posture:    Posture is normal. normal erect    Strength:    weak grip bilaterally. Otherwise strength is intact and symmetrical in the upper and lower limbs, no focal deficits.      Sensation: intact to LT     Reflex Exam:  DTR's:    Deep tendon reflexes in the upper and lower  extremities are symmetrical  bilaterally.   Toes:    The toes are downgoing bilaterally.   Clonus:    Clonus is absent.   Positive  mcphalen's maneuver  Assessment/Plan: 70 year old patient with likely bilateral carpal tunnel syndrome  Wear wrist splints for Carpal Tunnel Syndrome Come back for electromyogram (see below) Wear wrist splints. If doesn't get better and electromyogram confirms Carpal Tunnel Syndrome, I recommended surgery or injections into the wrists  Orders Placed This Encounter  Procedures   NCV with EMG(electromyography)   No orders of the defined types were placed in this encounter.   Cc: [name], MD,  [name], MD  Sarina Ill, MD  [facility] [address] [place], [place] [address]  Phone [phone] Fax [phone]  I spent over 35 minutes of face-to-face and non-face-to-face time with patient on the  1. Bilateral carpal tunnel syndrome    diagnosis.  This included previsit chart review, lab review, study review, order entry, electronic health record documentation, patient education on the different diagnostic and therapeutic options, counseling and coordination of care, risks and benefits of management, compliance, or risk factor reduction

## 2020-07-15 NOTE — Patient Instructions (Signed)
Wear wrist splints for Carpal Tunnel Syndrome Come back for electromyogram (see below) Wear wrist splints. If doesn't get better and electromyogram confirms Carpal Tunnel Syndrome, I recommended surgery or injections into the wrists  Carpal Tunnel Syndrome  Carpal tunnel syndrome is a condition that causes pain, numbness, and weakness in your hand and fingers. The carpal tunnel is a narrow area located on the palm side of your wrist. Repeated wrist motion or certain diseases may cause swelling within the tunnel. This swelling pinches the main nerve in the wrist.The main nerve in the wrist is called the median nerve. What are the causes? This condition may be caused by: Repeated and forceful wrist and hand motions. Wrist injuries. Arthritis. A cyst or tumor in the carpal tunnel. Fluid buildup during pregnancy. Use of tools that vibrate. Sometimes the cause of this condition is not known. What increases the risk? The following factors may make you more likely to develop this condition: Having a job that requires you to repeatedly or forcefully move your wrist or hand or requires you to use tools that vibrate. This may include jobs that involve using computers, working on an [employer], or working with [employer] such as Pension scheme manager. Being a woman. Having certain conditions, such as: Diabetes. Obesity. An underactive thyroid (hypothyroidism). Kidney failure. Rheumatoid arthritis. What are the signs or symptoms? Symptoms of this condition include: A tingling feeling in your fingers, especially in your thumb, index, and middle fingers. Tingling or numbness in your hand. An aching feeling in your entire arm, especially when your wrist and elbow are bent for a long time. Wrist pain that goes up your arm to your shoulder. Pain that goes down into your palm or fingers. A weak feeling in your hands. You may have trouble grabbing and holding items. Your symptoms may feel worse during  the night. How is this diagnosed? This condition is diagnosed with a medical history and physical exam. You may also have tests, including: Electromyogram (EMG). This test measures electrical signals sent by your nerves into the muscles. Nerve conduction study. This test measures how well electrical signals pass through your nerves. Imaging tests, such as X-rays, ultrasound, and MRI. These tests check for possible causes of your condition. How is this treated? This condition may be treated with: Lifestyle changes. It is important to stop or change the activity that caused your condition. Doing exercise and activities to strengthen and stretch your muscles and tendons (physical therapy). Making lifestyle changes to help with your condition and learning how to do your daily activities safely (occupational therapy). Medicines for pain and inflammation. This may include medicine that is injected into your wrist. A wrist splint or brace. Surgery. Follow these instructions at home: If you have a splint or brace: Wear the splint or brace as told by your health care provider. Remove it only as told by your health care provider. Loosen the splint or brace if your fingers tingle, become numb, or turn cold and blue. Keep the splint or brace clean. If the splint or brace is not waterproof: Do not let it get wet. Cover it with a watertight covering when you take a bath or shower. Managing pain, stiffness, and swelling If directed, put ice on the painful area. To do this: If you have a removeable splint or brace, remove it as told by your health care provider. Put ice in a plastic bag. Place a towel between your skin and the bag or between the splint  or brace and the bag. Leave the ice on for 20 minutes, 2-3 times a day. Do not fall asleep with the cold pack on your skin. Remove the ice if your skin turns bright red. This is very important. If you cannot feel pain, heat, or cold, you have a greater  risk of damage to the area. Move your fingers often to reduce stiffness and swelling. General instructions Take over-the-counter and prescription medicines only as told by your health care provider. Rest your wrist and hand from any activity that may be causing your pain. If your condition is work related, talk with your employer about changes that can be made, such as getting a wrist pad to use while typing. Do any exercises as told by your health care provider, physical therapist, or occupational therapist. Keep all follow-up visits. This is important. Contact a health care provider if: You have new symptoms. Your pain is not controlled with medicines. Your symptoms get worse. Get help right away if: You have severe numbness or tingling in your wrist or hand. Summary Carpal tunnel syndrome is a condition that causes pain, numbness, and weakness in your hand and fingers. It is usually caused by repeated wrist motions. Lifestyle changes and medicines are used to treat carpal tunnel syndrome. Surgery may be recommended. Follow your health care provider's instructions about wearing a splint, resting from activity, keeping follow-up visits, and calling for help. This information is not intended to replace advice given to you by your health care provider. Make sure you discuss any questions you have with your healthcare provider. Document Revised: 05/24/2019 Document Reviewed: 05/24/2019 Elsevier Patient Education  2022 [place] is a test to check how well your muscles and nerves are working. This procedure includes the combined use of electromyogram (EMG) and nerve conduction study (NCS). EMG is used to look for muscular disorders. NCS, which is also called electroneurogram, measures how well your nerves arecontrolling your muscles. The procedures are usually done together to check if your muscles and nerves are healthy. If the results of the tests  are abnormal, this may indicatedisease or injury, such as a neuromuscular disease or peripheral nerve damage. Tell a health care provider about: Any allergies you have. All medicines you are taking, including vitamins, herbs, eye drops, creams, and over-the-counter medicines. Any problems you or family members have had with anesthetic medicines. Any blood disorders you have. Any surgeries you have had. Any medical conditions you have. If you have a pacemaker. Whether you are pregnant or may be pregnant. What are the risks? Generally, this is a safe procedure. However, problems may occur, including: Infection where the electrodes were inserted. Bleeding. What happens before the procedure? Medicines Ask your health care provider about: Changing or stopping your regular medicines. This is especially important if you are taking diabetes medicines or blood thinners. Taking medicines such as aspirin and ibuprofen. These medicines can thin your blood. Do not take these medicines unless your health care provider tells you to take them. Taking over-the-counter medicines, vitamins, herbs, and supplements. General instructions Your health care provider may ask you to avoid: Beverages that have caffeine, such as coffee and tea. Any products that contain nicotine or tobacco. These products include cigarettes, e-cigarettes, and chewing tobacco. If you need help quitting, ask your health care provider. Do not use lotions or creams on the same day that you will be having the procedure. What happens during the procedure? For EMG  Your health care provider will  ask you to stay in a position so that he or she can access the muscle that will be studied. You may be standing, sitting, or lying down. You may be given a medicine that numbs the area (local anesthetic). A very thin needle that has an electrode will be inserted into your muscle. Another small electrode will be placed on your skin near the  muscle. Your health care provider will ask you to continue to remain still. The electrodes will send a signal that tells about the electrical activity of your muscles. You may see this on a monitor or hear it in the room. After your muscles have been studied at rest, your health care provider will ask you to contract or flex your muscles. The electrodes will send a signal that tells about the electrical activity of your muscles. Your health care provider will remove the electrodes and the electrode needles when the procedure is finished. The procedure may vary among health care providers and hospitals. For NCS  An electrode that records your nerve activity (recording electrode) will be placed on your skin by the muscle that is being studied. An electrode that is used as a reference (reference electrode) will be placed near the recording electrode. A paste or gel will be applied to your skin between the recording electrode and the reference electrode. Your nerve will be stimulated with a mild shock. Your health care provider will measure how much time it takes for your muscle to react. Your health care provider will remove the electrodes and the gel when the procedure is finished. The procedure may vary among health care providers and hospitals. What happens after the procedure? It is up to you to get the results of your procedure. Ask your health care provider, or the department that is doing the procedure, when your results will be ready. Your health care provider may: Give you medicines for any pain. Monitor the insertion sites to make sure that bleeding stops. Summary Electromyoneurogram is a test to check how well your muscles and nerves are working. If the results of the tests are abnormal, this may indicate disease or injury. This is a safe procedure. However, problems may occur, such as bleeding and infection. Your health care provider will do two tests to complete this procedure. One  checks your muscles (EMG) and another checks your nerves (NCS). It is up to you to get the results of your procedure. Ask your health care provider, or the department that is doing the procedure, when your results will be ready. This information is not intended to replace advice given to you by your health care provider. Make sure you discuss any questions you have with your healthcare provider. Document Revised: 09/27/2017 Document Reviewed: 09/09/2017 Elsevier Patient Education  [place].

## 2020-07-26 ENCOUNTER — Other Ambulatory Visit: Payer: Self-pay | Admitting: Cardiovascular Disease

## 2020-08-07 ENCOUNTER — Ambulatory Visit: Payer: Medicare PPO | Admitting: Neurology

## 2020-08-07 ENCOUNTER — Ambulatory Visit (INDEPENDENT_AMBULATORY_CARE_PROVIDER_SITE_OTHER): Payer: Medicare PPO | Admitting: Neurology

## 2020-08-07 ENCOUNTER — Other Ambulatory Visit: Payer: Self-pay

## 2020-08-07 DIAGNOSIS — G5603 Carpal tunnel syndrome, bilateral upper limbs: Secondary | ICD-10-CM

## 2020-08-07 DIAGNOSIS — G5623 Lesion of ulnar nerve, bilateral upper limbs: Secondary | ICD-10-CM

## 2020-08-07 DIAGNOSIS — Z0289 Encounter for other administrative examinations: Secondary | ICD-10-CM

## 2020-08-07 NOTE — Progress Notes (Signed)
See procedure note.

## 2020-08-07 NOTE — Progress Notes (Signed)
Full Name: Donna Rios Gender: Female MRN #: [id number] Date of Birth: 06/14/1950    Visit Date: 08/07/2020 10:22 Age: 70 Years Examining Physician: Sarina Ill, MD  Requesting Provider: [name], MD Primary Care Provider:  [name], MD  History: Tingling in the fingers  Summary: Nerve Conduction Studies were performed on the bilateral upper extremities.  The right Ulnar motor nerve showed decreased conduction velocity (B.Elbow - Wrist, 85m/s, N> 49) and decreased conduction velocity (A.Elbow - B.Elbow, 31m/s, N> 49). The left Ulnar motor nerve showed reduced amplitude(3.73mV, N>6) and decreased conduction velocity (B.Elbow - Wrist, 3m/s, N> 49) and decreased conduction velocity (A.Elbow - B.Elbow, 26 m/s, N> 49) with a 28m/s drop across the elbow.The right radial sensory nerve showed decreased amplitude(11uV, N>15).The left radial sensory nerve showed decreased amplitude(13uV, N>15). The right Median 2nd Digit orthodromic sensory nerve showed prolonged distal peak latency 4.4 ms, N<3.4) and reduced amplitude(7 uV, N>10). The left  Median 2nd Digit orthodromic sensory nerve showed prolonged distal peak latency 4.0 ms, N<3.4) and reduced amplitude(6 uV, N>10). The left and right Ulnar orthodromic sensory nerves showed no response. F Wave studies indicate that the right Ulnar F wave has delayed latency(35.6, N<77ms).F Wave studies indicate that the left Ulnar F wave has delayed latency(33.3, N<45ms). All remaining nerves (as indicated in the following tables) were within normal limits. All muscles (as indicated in the following tables) were within normal limits.     Conclusion: 1. 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 2. There is a possible compression of the right ulnar nerve at the elbow, as evidenced by the abnormal ulnar sensory conduction and decreased conduction velocity across the  elbow. 3. There is mild bilateral Carpal Tunnel Syndrome. 3. 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 No suggestion of cervical radiculopathy.   Orders Placed This Encounter  Procedures   Ambulatory referral to Orthopedic Surgery     Sarina Ill M.D.  [facility] [address], [place], [place] [address] Tel: [phone] Fax: [phone]  Verbal informed consent was obtained from the patient, patient was informed of potential risk of procedure, including bruising, bleeding, hematoma formation, infection, muscle weakness, muscle pain, numbness, among others.        [place]    Nerve / Sites Muscle Latency Ref. Amplitude Ref. Rel Amp Segments Distance Velocity Ref. Area    ms ms mV mV %  cm m/s m/s mVms  R Median - APB     Wrist APB 4.4 ?4.4 6.2 ?4.0 100 Wrist - APB 7   19.7     Upper arm APB 9.1  5.5  88.6 Upper arm - Wrist 23 49 ?49 22.2  L Median - APB     Wrist APB 4.4 ?4.4 6.8 ?4.0 100 Wrist - APB 7   20.8     Upper arm APB 9.1  5.5  80.8 Upper arm - Wrist 23 49 ?49 20.6  R Ulnar - ADM     Wrist ADM 3.3 ?3.3 7.1 ?6.0 100 Wrist - ADM 7   21.6     B.Elbow ADM 8.8  7.0  98.6 B.Elbow - Wrist 19 34 ?49 23.5     A.Elbow ADM 11.8  6.8  96.4 A.Elbow - B.Elbow 10 34 ?49 22.8  L Ulnar - ADM     Wrist ADM 3.3 ?3.3 3.4 ?6.0 100 Wrist - ADM 7  19.3     B.Elbow ADM 7.4  1.9  55.1 B.Elbow - Wrist 20 49 ?49 9.3     A.Elbow ADM 11.2  1.0  55 A.Elbow - B.Elbow 10 26 ?49 11.8             SNC    Nerve / Sites Rec. Site Peak Lat Ref.  Amp Ref. Segments Distance Peak Diff Ref.    ms ms V V  cm ms ms  R Radial - Anatomical snuff box (Forearm)     Forearm Wrist 2.7 ?2.9 11 ?15 Forearm - Wrist 10    L Radial - Anatomical snuff box (Forearm)     Forearm Wrist 2.5 ?2.9 13 ?15 Forearm - Wrist 10    L Median - Orthodromic (Dig II, Mid palm)     Dig II Wrist 4.0 ?3.4 6 ?10 Dig II - Wrist 13    R Median - Orthodromic (Dig II, Mid palm)     Dig II Wrist 4.4  ?3.4 7 ?10 Dig II - Wrist 13    L Ulnar - Orthodromic, (Dig V, Mid palm)     Dig V Wrist NR ?3.1 NR ?5 Dig V - Wrist 11    R Ulnar - Orthodromic, (Dig V, Mid palm)     Dig V Wrist NR ?3.1 NR ?5 Dig V - Wrist 30                   F  Wave    Nerve F Lat Ref.   ms ms  R Ulnar - ADM 35.6 ?32.0  L Ulnar - ADM 33.3 ?32.0         EMG Summary Table    Spontaneous MUAP Recruitment  Muscle IA Fib PSW Fasc Other Amp Dur. Poly Pattern  L. Cervical paraspinals (low) Normal None None None _______ Normal Normal Normal Normal  R. Cervical paraspinals (low) Normal None None None _______ Normal Normal Normal Normal  L. Deltoid Normal None None None _______ Normal Normal Normal Normal  R. Deltoid Normal None None None _______ Normal Normal Normal Normal  L. Triceps brachii Normal None None None _______ Normal Normal Normal Normal  R. Triceps brachii Normal None None None _______ Normal Normal Normal Normal  L. Flexor digitorum profundus (Ulnar) Normal None None None _______ Normal Normal Normal Normal  R. Flexor digitorum profundus (Ulnar) Normal None None None _______ Normal Normal Normal Normal  L. Pronator teres Normal None None None _______ Normal Normal Normal Normal  R. Pronator teres Normal None None None _______ Normal Normal Normal Normal  L. First dorsal interosseous Normal None None None _______ Normal Normal Normal Reduced  R. First dorsal interosseous Normal None None None _______ Normal Normal Normal Normal  L. Opponens pollicis Normal None None None _______ Normal Normal Normal Normal  R. Opponens pollicis Normal None None None _______ Normal Normal Normal Normal

## 2020-08-10 NOTE — Procedures (Signed)
Full Name: Donna Rios Gender: Female MRN #: [id number] Date of Birth: 1950/07/10    Visit Date: 08/07/2020 10:22 Age: 70 Years Examining Physician: Sarina Ill, MD  Requesting Provider: [name], MD Primary Care Provider:  [name], MD  History: Tingling in the fingers  Summary: Nerve Conduction Studies were performed on the bilateral upper extremities.  The right Ulnar motor nerve showed decreased conduction velocity (B.Elbow - Wrist, 32m/s, N> 49) and decreased conduction velocity (A.Elbow - B.Elbow, 42m/s, N> 49). The left Ulnar motor nerve showed reduced amplitude(3.36mV, N>6) and decreased conduction velocity (B.Elbow - Wrist, 73m/s, N> 49) and decreased conduction velocity (A.Elbow - B.Elbow, 26 m/s, N> 49) with a 55m/s drop across the elbow.The right radial sensory nerve showed decreased amplitude(11uV, N>15).The left radial sensory nerve showed decreased amplitude(13uV, N>15). The right Median 2nd Digit orthodromic sensory nerve showed prolonged distal peak latency 4.4 ms, N<3.4) and reduced amplitude(7 uV, N>10). The left  Median 2nd Digit orthodromic sensory nerve showed prolonged distal peak latency 4.0 ms, N<3.4) and reduced amplitude(6 uV, N>10). The left and right Ulnar orthodromic sensory nerves showed no response. F Wave studies indicate that the right Ulnar F wave has delayed latency(35.6, N<51ms).F Wave studies indicate that the left Ulnar F wave has delayed latency(33.3, N<64ms). All remaining nerves (as indicated in the following tables) were within normal limits. All muscles (as indicated in the following tables) were within normal limits.     Conclusion: 1. 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 2. There is a possible compression of the right ulnar nerve at the elbow, as evidenced by the abnormal ulnar sensory conduction and decreased conduction velocity across the  elbow. 3. There is mild bilateral Carpal Tunnel Syndrome. 3. 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 No suggestion of cervical radiculopathy.   Orders Placed This Encounter  Procedures   Ambulatory referral to Orthopedic Surgery     Sarina Ill M.D.  [facility] [address], [place], [place] [address] Tel: [phone] Fax: [phone]  Verbal informed consent was obtained from the patient, patient was informed of potential risk of procedure, including bruising, bleeding, hematoma formation, infection, muscle weakness, muscle pain, numbness, among others.        [place]    Nerve / Sites Muscle Latency Ref. Amplitude Ref. Rel Amp Segments Distance Velocity Ref. Area    ms ms mV mV %  cm m/s m/s mVms  R Median - APB     Wrist APB 4.4 ?4.4 6.2 ?4.0 100 Wrist - APB 7   19.7     Upper arm APB 9.1  5.5  88.6 Upper arm - Wrist 23 49 ?49 22.2  L Median - APB     Wrist APB 4.4 ?4.4 6.8 ?4.0 100 Wrist - APB 7   20.8     Upper arm APB 9.1  5.5  80.8 Upper arm - Wrist 23 49 ?49 20.6  R Ulnar - ADM     Wrist ADM 3.3 ?3.3 7.1 ?6.0 100 Wrist - ADM 7   21.6     B.Elbow ADM 8.8  7.0  98.6 B.Elbow - Wrist 19 34 ?49 23.5     A.Elbow ADM 11.8  6.8  96.4 A.Elbow - B.Elbow 10 34 ?49 22.8  L Ulnar - ADM     Wrist ADM 3.3 ?3.3 3.4 ?6.0 100 Wrist - ADM 7  19.3     B.Elbow ADM 7.4  1.9  55.1 B.Elbow - Wrist 20 49 ?49 9.3     A.Elbow ADM 11.2  1.0  55 A.Elbow - B.Elbow 10 26 ?49 11.8             SNC    Nerve / Sites Rec. Site Peak Lat Ref.  Amp Ref. Segments Distance Peak Diff Ref.    ms ms V V  cm ms ms  R Radial - Anatomical snuff box (Forearm)     Forearm Wrist 2.7 ?2.9 11 ?15 Forearm - Wrist 10    L Radial - Anatomical snuff box (Forearm)     Forearm Wrist 2.5 ?2.9 13 ?15 Forearm - Wrist 10    L Median - Orthodromic (Dig II, Mid palm)     Dig II Wrist 4.0 ?3.4 6 ?10 Dig II - Wrist 13    R Median - Orthodromic (Dig II, Mid palm)     Dig II Wrist 4.4  ?3.4 7 ?10 Dig II - Wrist 13    L Ulnar - Orthodromic, (Dig V, Mid palm)     Dig V Wrist NR ?3.1 NR ?5 Dig V - Wrist 11    R Ulnar - Orthodromic, (Dig V, Mid palm)     Dig V Wrist NR ?3.1 NR ?5 Dig V - Wrist 38                   F  Wave    Nerve F Lat Ref.   ms ms  R Ulnar - ADM 35.6 ?32.0  L Ulnar - ADM 33.3 ?32.0         EMG Summary Table    Spontaneous MUAP Recruitment  Muscle IA Fib PSW Fasc Other Amp Dur. Poly Pattern  L. Cervical paraspinals (low) Normal None None None _______ Normal Normal Normal Normal  R. Cervical paraspinals (low) Normal None None None _______ Normal Normal Normal Normal  L. Deltoid Normal None None None _______ Normal Normal Normal Normal  R. Deltoid Normal None None None _______ Normal Normal Normal Normal  L. Triceps brachii Normal None None None _______ Normal Normal Normal Normal  R. Triceps brachii Normal None None None _______ Normal Normal Normal Normal  L. Flexor digitorum profundus (Ulnar) Normal None None None _______ Normal Normal Normal Normal  R. Flexor digitorum profundus (Ulnar) Normal None None None _______ Normal Normal Normal Normal  L. Pronator teres Normal None None None _______ Normal Normal Normal Normal  R. Pronator teres Normal None None None _______ Normal Normal Normal Normal  L. First dorsal interosseous Normal None None None _______ Normal Normal Normal Reduced  R. First dorsal interosseous Normal None None None _______ Normal Normal Normal Normal  L. Opponens pollicis Normal None None None _______ Normal Normal Normal Normal  R. Opponens pollicis Normal None None None _______ Normal Normal Normal Normal

## 2020-08-11 ENCOUNTER — Telehealth: Payer: Self-pay

## 2020-08-11 NOTE — Telephone Encounter (Signed)
Referral sent to [facility]. P: [id number].

## 2020-08-14 ENCOUNTER — Other Ambulatory Visit: Payer: Self-pay

## 2020-08-14 ENCOUNTER — Ambulatory Visit: Payer: Medicare PPO | Admitting: Orthopedic Surgery

## 2020-08-14 DIAGNOSIS — G5622 Lesion of ulnar nerve, left upper limb: Secondary | ICD-10-CM | POA: Diagnosis not present

## 2020-08-22 ENCOUNTER — Ambulatory Visit: Payer: Medicare PPO | Admitting: Orthopedic Surgery

## 2020-08-26 ENCOUNTER — Ambulatory Visit (INDEPENDENT_AMBULATORY_CARE_PROVIDER_SITE_OTHER): Payer: Medicare PPO | Admitting: Pharmacist

## 2020-08-26 DIAGNOSIS — I480 Paroxysmal atrial fibrillation: Secondary | ICD-10-CM

## 2020-08-26 DIAGNOSIS — I10 Essential (primary) hypertension: Secondary | ICD-10-CM | POA: Diagnosis not present

## 2020-08-26 DIAGNOSIS — E782 Mixed hyperlipidemia: Secondary | ICD-10-CM | POA: Diagnosis not present

## 2020-08-26 DIAGNOSIS — F17210 Nicotine dependence, cigarettes, uncomplicated: Secondary | ICD-10-CM

## 2020-08-26 NOTE — Chronic Care Management (AMB) (Signed)
Chronic Care Management Pharmacy Note  08/26/2020 Name:  Donna Rios MRN:  [id number] DOB:  [birth year]  Subjective: Donna Rios is an 70 y.o. year old female who is a primary patient of [name], [name], MD.  The CCM team was consulted for assistance with disease management and care coordination needs.    Engaged with patient by telephone for follow up visit in response to provider referral for pharmacy case management and/or care coordination services.   Consent to Services:  The patient was given information about Chronic Care Management services, agreed to services, and gave verbal consent prior to initiation of services.  Please see initial visit note for detailed documentation.   Patient Care Team: [name], MD as PCP - General (Family Medicine) De Hollingshead, RPH-CPP as Pharmacist (Pharmacist)  Recent office visits: 6/10 - PCP - continue current regimen; add miralax PRN  Recent consult visits: 6/21Jaynee Eagles neurology - carpal tunnel, wear wrist splints - [facility]  Objective:  Lab Results  Component Value Date   CREATININE 0.75 07/04/2020   CREATININE 0.70 12/26/2019   CREATININE 0.73 09/18/2019    Lab Results  Component Value Date   HGBA1C 6.0 12/26/2019   Last diabetic Eye exam: No results found for: HMDIABEYEEXA  Last diabetic Foot exam: No results found for: HMDIABFOOTEX      Component Value Date/Time   CHOL 138 07/04/2020 1031   TRIG 174.0 (H) 07/04/2020 1031   HDL 52.70 07/04/2020 1031   CHOLHDL 3 07/04/2020 1031   VLDL 34.8 07/04/2020 1031   LDLCALC 51 07/04/2020 1031   LDLDIRECT 71.0 09/18/2019 1010    Hepatic Function Latest Ref Rng & Units 07/04/2020 09/18/2019 03/27/2019  Total Protein 6.0 - 8.3 g/dL 6.6 6.8 7.0  Albumin 3.5 - 5.2 g/dL 4.4 4.4 4.1  AST 0 - 37 U/L _0 ALT 0 - 35 U/L _1 Alk Phosphatase 39 - 117 U/L 77 92 78  Total Bilirubin 0.2 - 1.2 mg/dL 0.5 0.5 0.4  Bilirubin, Direct 0.0 - 0.3  mg/dL - - 0.1    Lab Results  Component Value Date/Time   TSH 0.81 10/16/2018 02:25 PM   TSH 1.410 08/16/2018 03:42 PM   FREET4 0.92 10/16/2018 02:25 PM    CBC Latest Ref Rng & Units 07/04/2020 10/04/2019 09/18/2019  WBC 4.0 - 10.5 K/uL 10.1 7.3 10.7(H)  Hemoglobin 12.0 - 15.0 g/dL 13.7 13.0 13.3  Hematocrit 36.0 - 46.0 % 39.5 38.4 38.8  Platelets 150.0 - 400.0 K/uL 180.0 176.0 191.0    No results found for: VD25OH  Clinical ASCVD: Yes  The 10-year ASCVD risk score Mikey Bussing DC Jr., et al., 2013) is: 18.6%   Values used to calculate the score:     Age: 4 years     Sex: Female     Is Non-Hispanic African American: No     Diabetic: No     Tobacco smoker: Yes     Systolic Blood Pressure: 875 mmHg     Is BP treated: Yes     HDL Cholesterol: 52.7 mg/dL     Total Cholesterol: 138 mg/dL     CHA2DS2-VASc Score = 4  This indicates a 4.8% annual risk of stroke. The patient's score is based upon: CHF History: No HTN History: Yes Diabetes History: No Stroke History: No Vascular Disease History: Yes Age Score: 1 Gender Score: 1   Social History   Tobacco Use  Smoking Status Some  Days   Packs/day: 0.50   Years: 46.00   Pack years: 23.00   Types: Cigarettes  Smokeless Tobacco Never  Tobacco Comments   stopped 06/11/2018...smokes 1-10 per day   BP Readings from Last 3 Encounters:  07/15/20 130/80  07/04/20 130/80  06/11/20 134/88   Pulse Readings from Last 3 Encounters:  07/15/20 75  07/04/20 78  06/11/20 75   Wt Readings from Last 3 Encounters:  07/15/20 192 lb (87.1 kg)  07/04/20 195 lb (88.5 kg)  06/11/20 191 lb 12.8 oz (87 kg)    Assessment: Review of patient past medical history, allergies, medications, health status, including review of consultants reports, laboratory and other test data, was performed as part of comprehensive evaluation and provision of chronic care management services.   SDOH:  (Social Determinants of Health) assessments and interventions  performed:  SDOH Interventions    Flowsheet Row Most Recent Value  SDOH Interventions   SDOH Interventions for the Following Domains Tobacco  Financial Strain Interventions Intervention Not Indicated  Tobacco Interventions Cessation Materials Given and Reviewed       CCM Care Plan  Allergies  Allergen Reactions   Ibuprofen Other (See Comments)    High does causes stomach pains   Asa [Aspirin] Other (See Comments)    UNSPECIFIED REACTION    Iohexol Rash    Pt had reaction after 06/21/18 TAVR CT scans    Medications Reviewed Today     Reviewed by De Hollingshead, RPH-CPP (Pharmacist) on 08/26/20 at 1458  Med List Status: <None>   Medication Order Taking? Sig Documenting Provider Last Dose Status Informant  apixaban (ELIQUIS) 5 MG TABS tablet [id number] Yes Take 1 tablet (5 mg total) by mouth 2 (two) times daily. [name], MD Taking Active   APPLE CIDER VINEGAR PO [id number] Yes Take 1 tablet by mouth daily as needed (with fatty foods).  [provider] Taking Active Self  b complex vitamins tablet [id number] Yes Take 1 tablet by mouth 2 (two) times a week.  [provider] Taking Active Self  Calcium-Magnesium-Vitamin D (CALCIUM 1200+D3 PO) [id number] Yes Take by mouth. [provider] Taking Active   cetirizine (ZYRTEC) 10 MG tablet [id number] Yes Take 10 mg by mouth as needed.  [provider] Taking Active Self  fluticasone (FLONASE) 50 MCG/ACT nasal spray [id number] Yes Place 1 spray into both nostrils daily as needed for allergies.  [provider] Taking Active Self  Lidocaine 4 % SOLN [id number] Yes Apply topically as needed. [provider] Taking Active   losartan (COZAAR) 25 MG tablet [id number] Yes Take 1 tablet (25 mg total) by mouth daily. [name], MD Taking Active   metoprolol tartrate (LOPRESSOR) 25 MG tablet [id number] No TAKE 1 TABLET BY MOUTH 2 (TWO) TIMES DAILY AS NEEDED (FOR FAST HEART RATE  >100, ATRIAL FIB).  Patient not taking: Reported on 08/26/2020   Minna Merritts, MD Not Taking Active   Multiple Vitamin (MULTIVITAMIN WITH MINERALS) TABS tablet [id number] Yes Take 1 tablet by mouth daily. Women's Complete Multivitamin 50+ [provider] Taking Active Self  Probiotic Product (PROBIOTIC ADVANCED PO) [id number] Yes Take 1 tablet by mouth every other day. [provider] Taking Active Self  Propylene Glycol (SYSTANE COMPLETE OP) [id number] Yes Place 1 drop into both eyes 3 (three) times daily as needed (dry/irritated eyes.).  [provider] Taking Active Self  rosuvastatin (CRESTOR) 40 MG tablet [id number] Yes TAKE 1 TABLET BY MOUTH EVERY DAY [name]  Darnell Level, MD Taking Active             Patient Active Problem List   Diagnosis Date Noted   Paroxysmal atrial fibrillation ([place]) 07/04/2020   Constipation 07/04/2020   Elevated pulse rate 04/02/2020   Aortic atherosclerosis ([place]) 03/21/2020   Chronic low back pain 12/19/2019   Hand tingling 12/19/2019   Encounter for general adult medical examination with abnormal findings 09/18/2019   Postmenopausal estrogen deficiency 09/18/2019   Nicotine dependence, cigarettes, uncomplicated 21/30/8657   Falls 06/11/2019   Injury of little finger 06/11/2019   Chronic pain of both knees 06/11/2019   Itching 03/27/2019   Hypertension 11/28/2018   Hair loss 10/21/2018   Headache 07/18/2018   S/P TAVR (transcatheter aortic valve replacement)    GERD (gastroesophageal reflux disease) 06/13/2018   Foot pain, bilateral 02/10/2018   Severe aortic valve stenosis 08/16/2017   Chronic left hip pain 08/10/2017   Rotator cuff impingement syndrome of left shoulder 08/10/2017   Anxiety 05/09/2017   Lesion of right eyelid 04/01/2017   Hyperlipidemia 05/07/2016   Prediabetes 11/06/2015   Urine frequency 08/15/2015   Venous insufficiency 06/11/2015   Stress incontinence 06/11/2015   Squamous cell carcinoma of skin  06/11/2015   Former smoker 06/11/2015   Chronic headaches 06/11/2015    Immunization History  Administered Date(s) Administered   Fluad Quad(high Dose 65+) 10/09/2018   Influenza, High Dose Seasonal PF 11/06/2015, 10/06/2016, 11/06/2019   Influenza-Unspecified 10/05/2017   Moderna SARS-COV2 Booster Vaccination 12/20/2019   Moderna Sars-Covid-2 Vaccination 04/18/2019, 05/19/2019   Pneumococcal Conjugate-13 05/06/2016   Pneumococcal Polysaccharide-23 06/07/2017, 08/15/2019   Tdap 05/06/2016   Zoster Recombinat (Shingrix) 08/15/2019, 10/18/2019    Conditions to be addressed/monitored: Atrial Fibrillation, HTN, and HLD  Care Plan : Medication Management  Updates made by De Hollingshead, RPH-CPP since 08/26/2020 12:00 AM     Problem: Afib, HTN, tobacco use      Long-Range Goal: Disease Progression Prevention   Start Date: 06/10/2020  This Visit's Progress: On track  Recent Progress: On track  Priority: High  Note:   Current Barriers:  Complex patient at high risk of medication errors, exacerbations  Pharmacist Clinical Goal(s):  Over the next 90 days, patient will achieve adherence to monitoring guidelines and medication adherence to achieve therapeutic efficacy through collaboration with PharmD and provider.   Interventions: 1:1 collaboration with [name], MD regarding development and update of comprehensive plan of care as evidenced by provider attestation and co-signature Inter-disciplinary care team collaboration (see longitudinal plan of care) Comprehensive medication review performed; medication list updated in electronic medical record  SDOH: Lots of family time recently for her birthday. Happy to have seen everyone. Some stress with her brother in law's health  Atrial Fibrillation/HTN: Controlled; current treatment: losartan 25 mg QAM; metoprolol 25 mg BID PRN HR >100; follows w/ Dr. Rockey Situ Antiplatelet regimen: Eliquis 5 mg BID Current home readings:  120-130/60-80s; HR 60-70 Weigh most days. Uses feelings to judge if she's eating too much salt. 190-192 lbs. No issues with tachycardia recently. No need for metoprolol PRN. Recommended to continue current regimen at this time along with collaboration with cardiology  Hyperlipidemia: Controlled per last lipid panel; current treatment: rosuvastatin 40 mg daily   Continue current regimen at this time along with collaboration with cardiology  Tobacco Abuse: Previously 1 packs per day; stopped smoking the day after her birthday (08/24/20); wearing 14 mg nicotine patch, taking off overnight. Some strange dreams, but no "bad" dreams. Reports she  has "set my mind to it" so is going to stop smoking.  Praised for choosing to quit smoking. Encouraged to continue to remain abstinent.   Patient Goals/Self-Care Activities Over the next 90 days, patient will:  - take medications as prescribed check blood pressure daily, document, and provide at future appointments  Follow Up Plan: Telephone follow up appointment with care management team member scheduled for: ~ 6 weeks      Medication Assistance: None required.  Patient affirms current coverage meets needs.  Patient's preferred pharmacy is:  [id number] [place], [place] [id number]: [phone]: [phone]  Follow Up:  Patient agrees to Care Plan and Follow-up.  Plan: Telephone follow up appointment with care management team member scheduled for:  ~ 6 weeks  Catie TDarnelle Maffucci PharmD, [place] CDeltavilleClinical Pharmacist [facility] [phone]

## 2020-08-26 NOTE — Patient Instructions (Signed)
Visit Information  PATIENT GOALS:  Goals Addressed               This Visit's Progress     Patient Stated     Medication Monitoring (pt-stated)        Patient Goals/Self-Care Activities Over the next 90 days, patient will:  - take medications as prescribed check blood pressure daily, document, and provide at future appointments         Patient verbalizes understanding of instructions provided today and agrees to view in [place].    Plan: Telephone follow up appointment with care management team member scheduled for:  ~ 6 weeks  Catie Darnelle Maffucci, PharmD, [place], [place] Clinical Pharmacist [employer] at [employer] [phone]

## 2020-08-28 ENCOUNTER — Encounter: Payer: Self-pay | Admitting: Orthopedic Surgery

## 2020-08-28 NOTE — Progress Notes (Signed)
Office Visit Note   Patient: Donna Rios           Date of Birth: [birth year]           MRN: [id number] Visit Date: 08/14/2020 Requested by: Melvenia Beam, MD [place] [address] [place],  [place] [address] PCP: [name], MD  Subjective: Chief Complaint  Patient presents with   Other    BUE pain with numbness    HPI: Donna Rios is a 70 y.o. female who presents to the office complaining of left elbow pain with left hand numbness and tingling.  She returns to discuss nerve conduction study results that were performed on 08/07/2020 that revealed moderately severe left ulnar neuropathy with possible compression of the right ulnar nerve as well as bilateral carpal tunnel syndrome rated mild.  She denies much in the way of neck symptoms.  She does have history of a remote injury to her left elbow when she was a child and states that her elbow has always looked different than her uninjured elbow ever since then.              ROS: All systems reviewed are negative as they relate to the chief complaint within the history of present illness.  Patient denies fevers or chills.  Assessment & Plan: Visit Diagnoses:  1. Ulnar neuropathy at elbow of left upper extremity     Plan: Patient is a 70 year old female who presents complaining of left elbow pain with numbness and tingling in the fourth and fifth fingers of the left hand primarily.  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  Most of what bothering her is the numbness and tingling in the fourth and fifth fingers.  Plan to refer patient to Dr. Tempie Donning when he arrives to this office for further evaluation and likely surgical management.  Patient agreed with this plan.  Follow-Up Instructions: No follow-ups on file.   Orders:  No orders of the defined types were placed in this encounter.  No  orders of the defined types were placed in this encounter.     Procedures: No procedures performed   Clinical Data: No additional findings.  Objective: Vital Signs: There were no vitals taken for this visit.  Physical Exam:  Constitutional: Patient appears well-developed HEENT:  Head: Normocephalic Eyes:EOM are normal Neck: Normal range of motion Cardiovascular: Normal rate Pulmonary/chest: Effort normal Neurologic: Patient is alert Skin: Skin is warm Psychiatric: Patient has normal mood and affect  Ortho Exam: Ortho exam demonstrates left elbow with cubitus varus alignment.  Positive Tinel sign over the left elbow.  Positive elbow flexion test.  Positive Froment's sign.  Negative Tinel sign over the carpal tunnel.  Negative Phalen sign.  Decreased sensation through the fourth and fifth fingers of the left hand.    Specialty Comments:  No specialty comments available.  Imaging: No results found.   PMFS History: Patient Active Problem List   Diagnosis Date Noted   Paroxysmal atrial fibrillation ([place]) 07/04/2020   Constipation 07/04/2020   Elevated pulse rate 04/02/2020   Aortic atherosclerosis ([place]) 03/21/2020   Chronic low back pain 12/19/2019   Hand tingling 12/19/2019   Encounter for general adult medical examination with abnormal findings 09/18/2019   Postmenopausal estrogen deficiency 09/18/2019   Nicotine dependence, cigarettes, uncomplicated [id number]   Falls 06/11/2019   Injury  of little finger 06/11/2019   Chronic pain of both knees 06/11/2019   Itching 03/27/2019   Hypertension 11/28/2018   Hair loss 10/21/2018   Headache 07/18/2018   S/P TAVR (transcatheter aortic valve replacement)    GERD (gastroesophageal reflux disease) 06/13/2018   Foot pain, bilateral 02/10/2018   Severe aortic valve stenosis 08/16/2017   Chronic left hip pain 08/10/2017   Rotator cuff impingement syndrome of left shoulder 08/10/2017   Anxiety 05/09/2017   Lesion of right  eyelid 04/01/2017   Hyperlipidemia 05/07/2016   Prediabetes 11/06/2015   Urine frequency 08/15/2015   Venous insufficiency 06/11/2015   Stress incontinence 06/11/2015   Squamous cell carcinoma of skin 06/11/2015   Former smoker 06/11/2015   Chronic headaches 06/11/2015   Past Medical History:  Diagnosis Date   Allergic rhinitis    Arthritis    Fatty liver    Hypertension    Phlebitis    S/P TAVR (transcatheter aortic valve replacement)    Severe aortic stenosis    Stress incontinence    Tobacco abuse     Family History  Problem Relation Age of Onset   Alcoholism Other    Arthritis Other    Lung cancer Other    Heart disease Other    Stroke Other    Hypertension Other    Diabetes Other    Heart failure Mother    Hypertension Mother    Diabetes Mother    Heart failure Father    Hypotension Father    Breast cancer Neg Hx     Past Surgical History:  Procedure Laterality Date   ABDOMINAL HYSTERECTOMY     BREAST BIOPSY Left    ducts removed   Cataract surgery     COLONOSCOPY WITH PROPOFOL N/A 09/29/2017   Procedure: COLONOSCOPY WITH PROPOFOL;  Surgeon: Lin Landsman, MD;  Location: [facility] ENDOSCOPY;  Service: Gastroenterology;  Laterality: N/A;   EYE SURGERY     FOOT SURGERY     7   INTRAOPERATIVE TRANSTHORACIC ECHOCARDIOGRAM N/A 07/11/2018   Procedure: Intraoperative Transthoracic Echocardiogram;  Surgeon: Sherren Mocha, MD;  Location: [place];  Service: Open Heart Surgery;  Laterality: N/A;   KNEE ARTHROSCOPY Right    LAPAROSCOPIC HYSTERECTOMY     RIGHT HEART CATH AND CORONARY ANGIOGRAPHY N/A 06/15/2018   Procedure: RIGHT HEART CATH AND CORONARY ANGIOGRAPHY;  Surgeon: Sherren Mocha, MD;  Location: [place] [facility];  Service: Cardiovascular;  Laterality: N/A;   TRANSCATHETER AORTIC VALVE REPLACEMENT, TRANSFEMORAL  07/11/2018   TRANSCATHETER AORTIC VALVE REPLACEMENT, TRANSFEMORAL N/A 07/11/2018   Procedure: TRANSCATHETER AORTIC VALVE REPLACEMENT, TRANSFEMORAL;   Surgeon: Sherren Mocha, MD;  Location: [place];  Service: Open Heart Surgery;  Laterality: N/A;   VARICOSE VEIN SURGERY     Social History   Occupational History   Not on file  Tobacco Use   Smoking status: Some Days    Packs/day: 0.50    Years: 46.00    Pack years: 23.00    Types: Cigarettes   Smokeless tobacco: Never   Tobacco comments:    stopped 06/11/2018...smokes 1-10 per day  Vaping Use   Vaping Use: Never used  Substance and Sexual Activity   Alcohol use: Not Currently    Alcohol/week: 0.0 standard drinks   Drug use: No   Sexual activity: Not on file

## 2020-09-19 ENCOUNTER — Other Ambulatory Visit: Payer: Self-pay

## 2020-09-19 ENCOUNTER — Ambulatory Visit: Payer: Medicare PPO | Admitting: Family Medicine

## 2020-09-19 ENCOUNTER — Encounter: Payer: Self-pay | Admitting: Family Medicine

## 2020-09-19 DIAGNOSIS — R109 Unspecified abdominal pain: Secondary | ICD-10-CM | POA: Diagnosis not present

## 2020-09-19 DIAGNOSIS — R7303 Prediabetes: Secondary | ICD-10-CM | POA: Diagnosis not present

## 2020-09-19 DIAGNOSIS — I10 Essential (primary) hypertension: Secondary | ICD-10-CM

## 2020-09-19 LAB — COMPREHENSIVE METABOLIC PANEL
ALT: 15 U/L (ref 0–35)
AST: 19 U/L (ref 0–37)
Albumin: 4.1 g/dL (ref 3.5–5.2)
Alkaline Phosphatase: 71 U/L (ref 39–117)
BUN: 10 mg/dL (ref 6–23)
CO2: 27 mEq/L (ref 19–32)
Calcium: 9.4 mg/dL (ref 8.4–10.5)
Chloride: 105 mEq/L (ref 96–112)
Creatinine, Ser: 0.69 mg/dL (ref 0.40–1.20)
GFR: 88.14 mL/min (ref >60.00)
Glucose, Bld: 86 mg/dL (ref 70–99)
Potassium: 4.3 mEq/L (ref 3.5–5.1)
Sodium: 140 mEq/L (ref 135–145)
Total Bilirubin: 0.6 mg/dL (ref 0.2–1.2)
Total Protein: 6.4 g/dL (ref 6.0–8.3)

## 2020-09-19 LAB — HEMOGLOBIN A1C: Hgb A1c MFr Bld: 5.9 % (ref 4.6–6.5)

## 2020-09-19 NOTE — Assessment & Plan Note (Signed)
Adequately controlled.  She will continue losartan 25 mg daily.

## 2020-09-19 NOTE — Patient Instructions (Signed)
Nice to see you. We will get some lab work today and contact you with the results. Please try to continue with walking for exercise. Please try to monitor your diet.  Diet Recommendations  Starchy (carb) foods: Bread, rice, pasta, potatoes, corn, cereal, grits, crackers, bagels, muffins, all baked goods.  (Fruits, milk, and yogurt also have carbohydrate, but most of these foods will not spike your blood sugar as the starchy foods will.)  A few fruits do cause high blood sugars; use small portions of bananas (limit to 1/2 at a time), grapes, watermelon, oranges, and most tropical fruits.    Protein foods: Meat, fish, poultry, eggs, dairy foods, and beans such as pinto and kidney beans (beans also provide carbohydrate).   1. Eat at least 3 meals and 1-2 snacks per day. Never go more than 4-5 hours while awake without eating. Eat breakfast within the first hour of getting up.   2. Limit starchy foods to TWO per meal and ONE per snack. ONE portion of a starchy  food is equal to the following:   - ONE slice of bread (or its equivalent, such as half of a hamburger bun).   - 1/2 cup of a "scoopable" starchy food such as potatoes or rice.   - 15 grams of carbohydrate as shown on food label.  3. Include at every meal: a protein food, a carb food, and vegetables and/or fruit.   - Obtain twice the volume of veg's as protein or carbohydrate foods for both lunch and dinner.   - Fresh or frozen veg's are best.   - Keep frozen veg's on hand for a quick vegetable serving.

## 2020-09-19 NOTE — Progress Notes (Signed)
[name], MD Phone: [phone]  Donna Rios is a 70 y.o. female who presents today for follow-up.  Hypertension: Blood pressure is mostly less than 130/80.  She is taking losartan.  No chest pain or shortness of breath.  Prediabetes: Notes she tries to walk some for exercise.  She celebrated her 70th birthday recently and did not eat very well around that time though typically eats generally healthy diet.  She does drink some sweet tea.  Occasionally has a biscuit.  Does have cereal for breakfast.  Right flank discomfort: This has been going on 2 to 3 weeks.  Notes a catch in her right side between her ribs and her iliac crest.  No radiation.  No nausea or vomiting.  No diarrhea.  Notes good bowel movements on a daily basis.  Social History   Tobacco Use  Smoking Status Some Days   Packs/day: 0.50   Years: 46.00   Pack years: 23.00   Types: Cigarettes  Smokeless Tobacco Never  Tobacco Comments   stopped 06/11/2018...smokes 1-10 per day    Current Outpatient Medications on File Prior to Visit  Medication Sig Dispense Refill   apixaban (ELIQUIS) 5 MG TABS tablet Take 1 tablet (5 mg total) by mouth 2 (two) times daily. 180 tablet 3   APPLE CIDER VINEGAR PO Take 1 tablet by mouth daily as needed (with fatty foods).      b complex vitamins tablet Take 1 tablet by mouth 2 (two) times a week.      Calcium-Magnesium-Vitamin D (CALCIUM 1200+D3 PO) Take by mouth.     cetirizine (ZYRTEC) 10 MG tablet Take 10 mg by mouth as needed.      fluticasone (FLONASE) 50 MCG/ACT nasal spray Place 1 spray into both nostrils daily as needed for allergies.      Lidocaine 4 % SOLN Apply topically as needed.     losartan (COZAAR) 25 MG tablet Take 1 tablet (25 mg total) by mouth daily. 90 tablet 3   metoprolol tartrate (LOPRESSOR) 25 MG tablet TAKE 1 TABLET BY MOUTH 2 (TWO) TIMES DAILY AS NEEDED (FOR FAST HEART RATE >100, ATRIAL FIB). 180 tablet 1   Multiple Vitamin (MULTIVITAMIN WITH MINERALS)  TABS tablet Take 1 tablet by mouth daily. Women's Complete Multivitamin 50+     Probiotic Product (PROBIOTIC ADVANCED PO) Take 1 tablet by mouth every other day.     Propylene Glycol (SYSTANE COMPLETE OP) Place 1 drop into both eyes 3 (three) times daily as needed (dry/irritated eyes.).      rosuvastatin (CRESTOR) 40 MG tablet TAKE 1 TABLET BY MOUTH EVERY DAY 90 tablet 2   No current facility-administered medications on file prior to visit.     ROS see history of present illness  Objective  Physical Exam Vitals:   09/19/20 1020  BP: 118/80  Pulse: 75  Temp: 98.7 F (37.1 C)  SpO2: 99%    BP Readings from Last 3 Encounters:  09/19/20 118/80  07/15/20 130/80  07/04/20 130/80   Wt Readings from Last 3 Encounters:  09/19/20 194 lb (88 kg)  07/15/20 192 lb (87.1 kg)  07/04/20 195 lb (88.5 kg)    Physical Exam Constitutional:      General: She is not in acute distress.    Appearance: She is not diaphoretic.  Cardiovascular:     Rate and Rhythm: Normal rate and regular rhythm.     Heart sounds: Normal heart sounds.  Pulmonary:     Effort: Pulmonary effort is  normal.     Breath sounds: Normal breath sounds.  Abdominal:     General: Bowel sounds are normal. There is no distension.     Palpations: Abdomen is soft.     Tenderness: There is no abdominal tenderness. There is no guarding or rebound.     Comments: No tenderness over her right flank  Skin:    General: Skin is warm and dry.  Neurological:     Mental Status: She is alert.     Assessment/Plan: Please see individual problem list.  Problem List Items Addressed This Visit     Hypertension    Adequately controlled.  She will continue losartan 25 mg daily.      Relevant Orders   Comp Met (CMET)   Prediabetes    Check A1c.  Encouraged to continue exercise.  Given dietary information.      Relevant Orders   HgB A1c   Right flank pain    Undetermined cause.  She has benign exam.  Discussed it could be a  liver issue or muscular issue.  She was concerned about her pancreas so advised that her pancreas was located epigastric area.  We will check a CMP.  She will monitor.        Return in about 4 months (around 01/19/2021) for Please cancel the visit in October.  This visit occurred during the SARS-CoV-2 public health emergency.  Safety protocols were in place, including screening questions prior to the visit, additional usage of staff PPE, and extensive cleaning of exam room while observing appropriate contact time as indicated for disinfecting solutions.    [name], MD [place]

## 2020-09-19 NOTE — Assessment & Plan Note (Signed)
Undetermined cause.  She has benign exam.  Discussed it could be a liver issue or muscular issue.  She was concerned about her pancreas so advised that her pancreas was located epigastric area.  We will check a CMP.  She will monitor.

## 2020-09-19 NOTE — Assessment & Plan Note (Signed)
Check A1c.  Encouraged to continue exercise.  Given dietary information.

## 2020-09-30 ENCOUNTER — Ambulatory Visit: Payer: Medicare PPO | Admitting: Orthopedic Surgery

## 2020-09-30 ENCOUNTER — Other Ambulatory Visit: Payer: Self-pay

## 2020-09-30 DIAGNOSIS — G5603 Carpal tunnel syndrome, bilateral upper limbs: Secondary | ICD-10-CM | POA: Insufficient documentation

## 2020-09-30 DIAGNOSIS — R200 Anesthesia of skin: Secondary | ICD-10-CM

## 2020-09-30 NOTE — Progress Notes (Signed)
Office Visit Note   Patient: Donna Rios           Date of Birth: [birth year]           MRN: [id number] Visit Date: 09/30/2020              Requested by: [name], MD [address] [place],  [place] [address] PCP: [name], MD   Assessment & Plan: Visit Diagnoses: No diagnosis found.  Her bilateral hand numbness and paresthesias could be explained by bilateral carpal tunnel syndrome and left cubital tunnel syndrome.  Plan: Discussed with patient at this point we will continue to treat her conservatively.  She will continue wearing wrist splints at night we will get her a brace for her left elbow to prevent flexion during sleep and also provide a pad to protect her nerve when setting her elbow down.  We discussed both carpal and cubital tunnel releases in general but she is not interested in pursuing any surgery in the near future.  Follow-Up Instructions: No follow-ups on file.   Orders:  No orders of the defined types were placed in this encounter.  No orders of the defined types were placed in this encounter.     Procedures: No procedures performed   Clinical Data: No additional findings.   Subjective: No chief complaint on file.   CTS Risk Factors: DM: Neg Thyroid disease: Neg Inflammatory arthritis: Neg Wrist trauma: Neg C-spine: Neg  This is a 70 year old right-hand-dominant female who presents with bilateral hand numbness and paresthesias.  Her symptoms have been going on for at least 8 months.  She describes sensation changes in both hands involving all of her fingers.  She has worn wrist braces in the past with some resolution of her presumed carpal tunnel symptoms.  She had a left elbow fracture at the age of 48 with some residual deformity.  She describes difficulty with driving and gripping with worsening of her numbness and paresthesias.  She describes occasional nocturnal symptoms when she has to wake up and shake her hands  for symptom relief.  She is a near 50-pack-year smoker and previously worked in a Clinical cytogeneticist.    Review of Systems  Constitutional: Negative.   Respiratory:  Positive for cough.   Cardiovascular: Negative.   Skin: Negative.   Neurological:  Positive for numbness.  Hematological:        On eliquis for major vessel disease    Objective: Vital Signs: There were no vitals taken for this visit.  Physical Exam Cardiovascular:     Rate and Rhythm: Normal rate.     Pulses: Normal pulses.  Pulmonary:     Effort: Pulmonary effort is normal.  Skin:    General: Skin is warm and dry.     Capillary Refill: Capillary refill takes less than 2 seconds.  Neurological:     General: No focal deficit present.     Mental Status: She is alert.    Ortho Exam No thenar or intrinsic atrophy bilaterally 5-5 thenar intrinsic and first dorsal interosseous strength Positive Tinel sign bilaterally Negative Phalen and carpal tunnel compression test bilaterally Negative Tinel at right elbow Mildly positive Tinel left elbow  Two-point discrimination is 5 mm in all fingers on the right Two-point discrimination is 5 mm left index finger and over 8 mm in the left small finger  Specialty Comments:  No specialty comments available.  Imaging: No results found.   PMFS History: Patient  Active Problem List   Diagnosis Date Noted   Right flank pain 09/19/2020   Paroxysmal atrial fibrillation ([place]) 07/04/2020   Constipation 07/04/2020   Elevated pulse rate 04/02/2020   Aortic atherosclerosis ([place]) 03/21/2020   Chronic low back pain 12/19/2019   Hand tingling 12/19/2019   Encounter for general adult medical examination with abnormal findings 09/18/2019   Postmenopausal estrogen deficiency 09/18/2019   Nicotine dependence, cigarettes, uncomplicated [id number]   Falls 06/11/2019   Injury of little finger 06/11/2019   Chronic pain of both knees 06/11/2019   Itching 03/27/2019   Hypertension  11/28/2018   Hair loss 10/21/2018   Headache 07/18/2018   S/P TAVR (transcatheter aortic valve replacement)    GERD (gastroesophageal reflux disease) 06/13/2018   Foot pain, bilateral 02/10/2018   Severe aortic valve stenosis 08/16/2017   Chronic left hip pain 08/10/2017   Rotator cuff impingement syndrome of left shoulder 08/10/2017   Anxiety 05/09/2017   Lesion of right eyelid 04/01/2017   Hyperlipidemia 05/07/2016   Prediabetes 11/06/2015   Urine frequency 08/15/2015   Venous insufficiency 06/11/2015   Stress incontinence 06/11/2015   Squamous cell carcinoma of skin 06/11/2015   Former smoker 06/11/2015   Chronic headaches 06/11/2015   Past Medical History:  Diagnosis Date   Allergic rhinitis    Arthritis    Fatty liver    Hypertension    Phlebitis    S/P TAVR (transcatheter aortic valve replacement)    Severe aortic stenosis    Stress incontinence    Tobacco abuse     Family History  Problem Relation Age of Onset   Alcoholism Other    Arthritis Other    Lung cancer Other    Heart disease Other    Stroke Other    Hypertension Other    Diabetes Other    Heart failure Mother    Hypertension Mother    Diabetes Mother    Heart failure Father    Hypotension Father    Breast cancer Neg Hx     Past Surgical History:  Procedure Laterality Date   ABDOMINAL HYSTERECTOMY     BREAST BIOPSY Left    ducts removed   Cataract surgery     COLONOSCOPY WITH PROPOFOL N/A 09/29/2017   Procedure: COLONOSCOPY WITH PROPOFOL;  Surgeon: Lin Landsman, MD;  Location: [facility] ENDOSCOPY;  Service: Gastroenterology;  Laterality: N/A;   EYE SURGERY     FOOT SURGERY     7   INTRAOPERATIVE TRANSTHORACIC ECHOCARDIOGRAM N/A 07/11/2018   Procedure: Intraoperative Transthoracic Echocardiogram;  Surgeon: Sherren Mocha, MD;  Location: [place];  Service: Open Heart Surgery;  Laterality: N/A;   KNEE ARTHROSCOPY Right    LAPAROSCOPIC HYSTERECTOMY     RIGHT HEART CATH AND CORONARY ANGIOGRAPHY  N/A 06/15/2018   Procedure: RIGHT HEART CATH AND CORONARY ANGIOGRAPHY;  Surgeon: Sherren Mocha, MD;  Location: [place] CV LAB;  Service: Cardiovascular;  Laterality: N/A;   TRANSCATHETER AORTIC VALVE REPLACEMENT, TRANSFEMORAL  07/11/2018   TRANSCATHETER AORTIC VALVE REPLACEMENT, TRANSFEMORAL N/A 07/11/2018   Procedure: TRANSCATHETER AORTIC VALVE REPLACEMENT, TRANSFEMORAL;  Surgeon: Sherren Mocha, MD;  Location: [place];  Service: Open Heart Surgery;  Laterality: N/A;   VARICOSE VEIN SURGERY     Social History   Occupational History   Not on file  Tobacco Use   Smoking status: Some Days    Packs/day: 0.50    Years: 46.00    Pack years: 23.00    Types: Cigarettes   Smokeless tobacco: Never  Tobacco comments:    stopped 06/11/2018...smokes 1-10 per day  Vaping Use   Vaping Use: Never used  Substance and Sexual Activity   Alcohol use: Not Currently    Alcohol/week: 0.0 standard drinks   Drug use: No   Sexual activity: Not on file

## 2020-10-01 NOTE — Progress Notes (Signed)
Office Visit Note              Patient: Donna Rios                                 Date of Birth: [birth year]                                                    MRN: [id number] Visit Date: 09/30/2020                                                                     Requested by: [name], MD [address] [place],  [place] [address] PCP: [name], MD     Assessment & Plan: Visit Diagnoses: No diagnosis found.   Her bilateral hand numbness and paresthesias could be explained by bilateral carpal tunnel syndrome and left cubital tunnel syndrome.   Plan: Discussed with patient at this point we will continue to treat her conservatively.  She will continue wearing wrist splints at night we will get her a brace for her left elbow to prevent flexion during sleep and also provide a pad to protect her nerve when setting her elbow down.  We discussed both carpal and cubital tunnel releases in general but she is not interested in pursuing any surgery in the near future.   Follow-Up Instructions: No follow-ups on file.    Orders:  No orders of the defined types were placed in this encounter.   No orders of the defined types were placed in this encounter.        Procedures: No procedures performed     Clinical Data: No additional findings.     Subjective: No chief complaint on file.     CTS Risk Factors: DM: Neg Thyroid disease: Neg Inflammatory arthritis: Neg Wrist trauma: Neg C-spine: Neg   This is a 70 year old right-hand-dominant female who presents with bilateral hand numbness and paresthesias.  Her symptoms have been going on for at least 8 months.  She describes sensation changes in both hands involving all of her fingers.  She has worn wrist braces in the past with some resolution of her presumed carpal tunnel symptoms.  She had a left elbow fracture at the age of 67 with some residual deformity.  She describes difficulty with driving and  gripping with worsening of her numbness and paresthesias.  She describes occasional nocturnal symptoms when she has to wake up and shake her hands for symptom relief.  She is a near 50-pack-year smoker and previously worked in a Clinical cytogeneticist.      Review of Systems  Constitutional: Negative.   Respiratory:  Positive for cough.   Cardiovascular: Negative.   Skin: Negative.   Neurological:  Positive for numbness.  Hematological:        On eliquis for major vessel disease      Objective: Vital Signs: There were no vitals taken for this visit.   Physical Exam Cardiovascular:     Rate and  Rhythm: Normal rate.     Pulses: Normal pulses.  Pulmonary:     Effort: Pulmonary effort is normal.  Skin:    General: Skin is warm and dry.     Capillary Refill: Capillary refill takes less than 2 seconds.  Neurological:     General: No focal deficit present.     Mental Status: She is alert.      Ortho Exam No thenar or intrinsic atrophy bilaterally 5-5 thenar intrinsic and first dorsal interosseous strength Positive Tinel sign bilaterally Negative Phalen and carpal tunnel compression test bilaterally Negative Tinel at right elbow Mildly positive Tinel left elbow  Two-point discrimination is 5 mm in all fingers on the right Two-point discrimination is 5 mm left index finger and over 8 mm in the left small finger   Specialty Comments:  No specialty comments available.   Imaging: No results found.     PMFS History:     Patient Active Problem List    Diagnosis Date Noted   Right flank pain 09/19/2020   Paroxysmal atrial fibrillation ([place]) 07/04/2020   Constipation 07/04/2020   Elevated pulse rate 04/02/2020   Aortic atherosclerosis ([place]) 03/21/2020   Chronic low back pain 12/19/2019   Hand tingling 12/19/2019   Encounter for general adult medical examination with abnormal findings 09/18/2019   Postmenopausal estrogen deficiency 09/18/2019   Nicotine dependence, cigarettes,  uncomplicated [id number]   Falls 06/11/2019   Injury of little finger 06/11/2019   Chronic pain of both knees 06/11/2019   Itching 03/27/2019   Hypertension 11/28/2018   Hair loss 10/21/2018   Headache 07/18/2018   S/P TAVR (transcatheter aortic valve replacement)     GERD (gastroesophageal reflux disease) 06/13/2018   Foot pain, bilateral 02/10/2018   Severe aortic valve stenosis 08/16/2017   Chronic left hip pain 08/10/2017   Rotator cuff impingement syndrome of left shoulder 08/10/2017   Anxiety 05/09/2017   Lesion of right eyelid 04/01/2017   Hyperlipidemia 05/07/2016   Prediabetes 11/06/2015   Urine frequency 08/15/2015   Venous insufficiency 06/11/2015   Stress incontinence 06/11/2015   Squamous cell carcinoma of skin 06/11/2015   Former smoker 06/11/2015   Chronic headaches 06/11/2015        Past Medical History:  Diagnosis Date   Allergic rhinitis     Arthritis     Fatty liver     Hypertension     Phlebitis     S/P TAVR (transcatheter aortic valve replacement)     Severe aortic stenosis     Stress incontinence     Tobacco abuse           Family History  Problem Relation Age of Onset   Alcoholism Other     Arthritis Other     Lung cancer Other     Heart disease Other     Stroke Other     Hypertension Other     Diabetes Other     Heart failure Mother     Hypertension Mother     Diabetes Mother     Heart failure Father     Hypotension Father     Breast cancer Neg Hx           Past Surgical History:  Procedure Laterality Date   ABDOMINAL HYSTERECTOMY       BREAST BIOPSY Left      ducts removed   Cataract surgery       COLONOSCOPY WITH PROPOFOL N/A 09/29/2017    Procedure: COLONOSCOPY WITH PROPOFOL;  Surgeon: Lin Landsman, MD;  Location: [facility] ENDOSCOPY;  Service: Gastroenterology;  Laterality: N/A;   EYE SURGERY       FOOT SURGERY        7   INTRAOPERATIVE TRANSTHORACIC ECHOCARDIOGRAM N/A 07/11/2018    Procedure: Intraoperative Transthoracic  Echocardiogram;  Surgeon: Sherren Mocha, MD;  Location: [place];  Service: Open Heart Surgery;  Laterality: N/A;   KNEE ARTHROSCOPY Right     LAPAROSCOPIC HYSTERECTOMY       RIGHT HEART CATH AND CORONARY ANGIOGRAPHY N/A 06/15/2018    Procedure: RIGHT HEART CATH AND CORONARY ANGIOGRAPHY;  Surgeon: Sherren Mocha, MD;  Location: [place] CV LAB;  Service: Cardiovascular;  Laterality: N/A;   TRANSCATHETER AORTIC VALVE REPLACEMENT, TRANSFEMORAL   07/11/2018   TRANSCATHETER AORTIC VALVE REPLACEMENT, TRANSFEMORAL N/A 07/11/2018    Procedure: TRANSCATHETER AORTIC VALVE REPLACEMENT, TRANSFEMORAL;  Surgeon: Sherren Mocha, MD;  Location: [place];  Service: Open Heart Surgery;  Laterality: N/A;   VARICOSE VEIN SURGERY        Social History         Occupational History   Not on file  Tobacco Use   Smoking status: Some Days      Packs/day: 0.50      Years: 46.00      Pack years: 23.00      Types: Cigarettes   Smokeless tobacco: Never   Tobacco comments:      stopped 06/11/2018...smokes 1-10 per day  Vaping Use   Vaping Use: Never used  Substance and Sexual Activity   Alcohol use: Not Currently      Alcohol/week: 0.0 standard drinks   Drug use: No   Sexual activity: Not on file

## 2020-10-15 ENCOUNTER — Ambulatory Visit (INDEPENDENT_AMBULATORY_CARE_PROVIDER_SITE_OTHER): Payer: Medicare PPO | Admitting: Pharmacist

## 2020-10-15 DIAGNOSIS — I700 Atherosclerosis of aorta: Secondary | ICD-10-CM

## 2020-10-15 DIAGNOSIS — F17210 Nicotine dependence, cigarettes, uncomplicated: Secondary | ICD-10-CM

## 2020-10-15 DIAGNOSIS — I10 Essential (primary) hypertension: Secondary | ICD-10-CM

## 2020-10-15 DIAGNOSIS — E782 Mixed hyperlipidemia: Secondary | ICD-10-CM

## 2020-10-15 DIAGNOSIS — I480 Paroxysmal atrial fibrillation: Secondary | ICD-10-CM

## 2020-10-15 NOTE — Patient Instructions (Signed)
Visit Information  PATIENT GOALS:  Goals Addressed               This Visit's Progress     Patient Stated     Medication Monitoring (pt-stated)        Patient Goals/Self-Care Activities Over the next 90 days, patient will:  - take medications as prescribed check blood pressure daily, document, and provide at future appointments        Patient verbalizes understanding of instructions provided today and agrees to view in [place].    Plan: Telephone follow up appointment with care management team member scheduled for:  recommended f/u in 3 months, but patient requested 6 month follow up   Catie Darnelle Maffucci, PharmD, [place], [place] Pharmacist [employer] at [facility] [phone]

## 2020-10-15 NOTE — Chronic Care Management (AMB) (Signed)
Chronic Care Management Pharmacy Note  10/15/2020 Name:  Donna Rios MRN:  [id number] DOB:  [birth year]   Subjective: Donna Rios is an 70 y.o. year old female who is a primary patient of [name], [name], MD.  The CCM team was consulted for assistance with disease management and care coordination needs.    Engaged with patient by telephone for follow up visit in response to provider referral for pharmacy case management and/or care coordination services.   Consent to Services:  The patient was given information about Chronic Care Management services, agreed to services, and gave verbal consent prior to initiation of services.  Please see initial visit note for detailed documentation.   Patient Care Team: [name], MD as PCP - General (Family Medicine) De Hollingshead, RPH-CPP as Pharmacist (Pharmacist)   Objective:  Lab Results  Component Value Date   CREATININE 0.69 09/19/2020   CREATININE 0.75 07/04/2020   CREATININE 0.70 12/26/2019    Lab Results  Component Value Date   HGBA1C 5.9 09/19/2020   Last diabetic Eye exam: No results found for: HMDIABEYEEXA  Last diabetic Foot exam: No results found for: HMDIABFOOTEX      Component Value Date/Time   CHOL 138 07/04/2020 1031   TRIG 174.0 (H) 07/04/2020 1031   HDL 52.70 07/04/2020 1031   CHOLHDL 3 07/04/2020 1031   VLDL 34.8 07/04/2020 1031   LDLCALC 51 07/04/2020 1031   LDLDIRECT 71.0 09/18/2019 1010    Hepatic Function Latest Ref Rng & Units 09/19/2020 07/04/2020 09/18/2019  Total Protein 6.0 - 8.3 g/dL 6.4 6.6 6.8  Albumin 3.5 - 5.2 g/dL 4.1 4.4 4.4  AST 0 - 37 U/L _0 ALT 0 - 35 U/L _1 Alk Phosphatase 39 - 117 U/L 71 77 92  Total Bilirubin 0.2 - 1.2 mg/dL 0.6 0.5 0.5  Bilirubin, Direct 0.0 - 0.3 mg/dL - - -    Lab Results  Component Value Date/Time   TSH 0.81 10/16/2018 02:25 PM   TSH 1.410 08/16/2018 03:42 PM   FREET4 0.92 10/16/2018 02:25 PM    CBC Latest Ref Rng &  Units 07/04/2020 10/04/2019 09/18/2019  WBC 4.0 - 10.5 K/uL 10.1 7.3 10.7(H)  Hemoglobin 12.0 - 15.0 g/dL 13.7 13.0 13.3  Hematocrit 36.0 - 46.0 % 39.5 38.4 38.8  Platelets 150.0 - 400.0 K/uL 180.0 176.0 191.0     Clinical ASCVD: No  The 10-year ASCVD risk score (Arnett DK, et al., 2019) is: 15.6%   Values used to calculate the score:     Age: 52 years     Sex: Female     Is Non-Hispanic African American: No     Diabetic: No     Tobacco smoker: Yes     Systolic Blood Pressure: 427 mmHg     Is BP treated: Yes     HDL Cholesterol: 52.7 mg/dL     Total Cholesterol: 138 mg/dL     CHA2DS2-VASc Score = 4  This indicates a 4.8% annual risk of stroke. The patient's score is based upon: CHF History: 0 HTN History: 1 Diabetes History: 0 Stroke History: 0 Vascular Disease History: 1 Age Score: 1 Gender Score: 1    Social History   Tobacco Use  Smoking Status Some Days   Packs/day: 0.50   Years: 46.00   Pack years: 23.00   Types: Cigarettes  Smokeless Tobacco Never  Tobacco Comments   stopped 06/11/2018...smokes 1-10 per day  BP Readings from Last 3 Encounters:  09/19/20 118/80  07/15/20 130/80  07/04/20 130/80   Pulse Readings from Last 3 Encounters:  09/19/20 75  07/15/20 75  07/04/20 78   Wt Readings from Last 3 Encounters:  09/19/20 194 lb (88 kg)  07/15/20 192 lb (87.1 kg)  07/04/20 195 lb (88.5 kg)    Assessment: Review of patient past medical history, allergies, medications, health status, including review of consultants reports, laboratory and other test data, was performed as part of comprehensive evaluation and provision of chronic care management services.   SDOH:  (Social Determinants of Health) assessments and interventions performed:  SDOH Interventions    Flowsheet Row Most Recent Value  SDOH Interventions   SDOH Interventions for the Following Domains Tobacco  Financial Strain Interventions Intervention Not Indicated  Tobacco Interventions  Cessation Materials Given and Reviewed       CCM Care Plan  Allergies  Allergen Reactions   Ibuprofen Other (See Comments)    High does causes stomach pains   Asa [Aspirin] Other (See Comments)    UNSPECIFIED REACTION    Iohexol Rash    Pt had reaction after 06/21/18 TAVR CT scans    Medications Reviewed Today     Reviewed by De Hollingshead, RPH-CPP (Pharmacist) on 10/15/20 at 1533  Med List Status: <None>   Medication Order Taking? Sig Documenting Provider Last Dose Status Informant  apixaban (ELIQUIS) 5 MG TABS tablet [id number] Yes Take 1 tablet (5 mg total) by mouth 2 (two) times daily. [name], MD Taking Active   APPLE CIDER VINEGAR PO [id number] Yes Take 1 tablet by mouth daily as needed (with fatty foods).  [provider] Taking Active Self  b complex vitamins tablet [id number] Yes Take 1 tablet by mouth 2 (two) times a week.  [provider] Taking Active Self  Calcium-Magnesium-Vitamin D (CALCIUM 1200+D3 PO) [id number] Yes Take 1 tablet by mouth daily. [provider] Taking Active   cetirizine (ZYRTEC) 10 MG tablet [id number] Yes Take 10 mg by mouth as needed.  [provider] Taking Active Self  fluticasone (FLONASE) 50 MCG/ACT nasal spray [id number] Yes Place 1 spray into both nostrils daily as needed for allergies.  [provider] Taking Active Self  Lidocaine 4 % SOLN [id number] Yes Apply topically as needed. [provider] Taking Active   losartan (COZAAR) 25 MG tablet [id number] Yes Take 1 tablet (25 mg total) by mouth daily. [name], MD Taking Active   metoprolol tartrate (LOPRESSOR) 25 MG tablet [id number] No TAKE 1 TABLET BY MOUTH 2 (TWO) TIMES DAILY AS NEEDED (FOR FAST HEART RATE >100, ATRIAL FIB).  Patient not taking: Reported on 10/15/2020   Minna Merritts, MD Not Taking Active   Multiple Vitamin (MULTIVITAMIN WITH MINERALS) TABS tablet [id number] Yes Take 1 tablet by mouth daily. Women's  Complete Multivitamin 50+ [provider] Taking Active Self  Probiotic Product (PROBIOTIC ADVANCED PO) [id number] Yes Take 1 tablet by mouth every other day. [provider] Taking Active Self  Propylene Glycol (SYSTANE COMPLETE OP) [id number] Yes Place 1 drop into both eyes 3 (three) times daily as needed (dry/irritated eyes.).  [provider] Taking Active Self  rosuvastatin (CRESTOR) 40 MG tablet [id number] Yes TAKE 1 TABLET BY MOUTH EVERY DAY [name], MD Taking Active             Patient Active Problem List   Diagnosis Date Noted   Bilateral hand numbness 09/30/2020  Right flank pain 09/19/2020   Paroxysmal atrial fibrillation (HCC) 07/04/2020   Constipation 07/04/2020   Elevated pulse rate 04/02/2020   Aortic atherosclerosis ([place]) 03/21/2020   Chronic low back pain 12/19/2019   Hand tingling 12/19/2019   Encounter for general adult medical examination with abnormal findings 09/18/2019   Postmenopausal estrogen deficiency 09/18/2019   Nicotine dependence, cigarettes, uncomplicated 58/85/0277   Falls 06/11/2019   Injury of little finger 06/11/2019   Chronic pain of both knees 06/11/2019   Itching 03/27/2019   Hypertension 11/28/2018   Hair loss 10/21/2018   Headache 07/18/2018   S/P TAVR (transcatheter aortic valve replacement)    GERD (gastroesophageal reflux disease) 06/13/2018   Foot pain, bilateral 02/10/2018   Severe aortic valve stenosis 08/16/2017   Chronic left hip pain 08/10/2017   Rotator cuff impingement syndrome of left shoulder 08/10/2017   Anxiety 05/09/2017   Lesion of right eyelid 04/01/2017   Hyperlipidemia 05/07/2016   Prediabetes 11/06/2015   Urine frequency 08/15/2015   Venous insufficiency 06/11/2015   Stress incontinence 06/11/2015   Squamous cell carcinoma of skin 06/11/2015   Former smoker 06/11/2015   Chronic headaches 06/11/2015    Immunization History  Administered Date(s) Administered   Fluad  Quad(high Dose 65+) 10/09/2018   Influenza, High Dose Seasonal PF 11/06/2015, 10/06/2016, 11/06/2019   Influenza-Unspecified 10/05/2017   Moderna SARS-COV2 Booster Vaccination 12/20/2019   Moderna Sars-Covid-2 Vaccination 04/18/2019, 05/19/2019   Pneumococcal Conjugate-13 05/06/2016   Pneumococcal Polysaccharide-23 06/07/2017, 08/15/2019   Tdap 05/06/2016   Zoster Recombinat (Shingrix) 08/15/2019, 10/18/2019    Conditions to be addressed/monitored: Atrial Fibrillation, HTN, and HLD  Care Plan : Medication Management  Updates made by De Hollingshead, RPH-CPP since 10/15/2020 12:00 AM     Problem: Afib, HTN, tobacco use      Long-Range Goal: Disease Progression Prevention   Start Date: 06/10/2020  This Visit's Progress: On track  Recent Progress: On track  Priority: High  Note:   Current Barriers:  Complex patient at high risk of medication errors, exacerbations  Pharmacist Clinical Goal(s):  Over the next 90 days, patient will achieve adherence to monitoring guidelines and medication adherence to achieve therapeutic efficacy through collaboration with PharmD and provider.   Interventions: 1:1 collaboration with [name], MD regarding development and update of comprehensive plan of care as evidenced by provider attestation and co-signature Inter-disciplinary care team collaboration (see longitudinal plan of care) Comprehensive medication review performed; medication list updated in electronic medical record  Health Maintenance Yearly influenza vaccination: due - recommended to pursue this season Td/Tdap vaccination: up to date Pneumonia vaccination: up to date COVID vaccinations: due - recommended to pursue bivalent booster  Shingrix vaccinations: up to date Colonoscopy: up to date Bone density scan: up to date Mammogram: up to date  Atrial Fibrillation/HTN: Controlled; current treatment: losartan 25 mg QAM; metoprolol 25 mg BID PRN HR >100 - though has not  needed recently; follows w/ Dr. Rockey Situ Antiplatelet regimen: Eliquis 5 mg BID Current home readings: checks daily, 120-130/60-80s; HR 60-70, except the day she watched the [place] funeral and she was emotional No issues with tachycardia recently. No need for metoprolol PRN.  Recommended to continue current regimen at this time along with collaboration with cardiology  Hyperlipidemia: Controlled per last lipid panel; current treatment: rosuvastatin 40 mg daily, moved to QAM   Continue current regimen at this time along with collaboration with cardiology  Tobacco Abuse: <1 pack per day; had previously stopped smoking, but had stressful family things (  stepson had a stroke), so she restarted smoking. Reports she was also stress eating, so has gained weight.  Had stopped smoking the day after her birthday ([birth year]), but restarted about 3-4 weeks later. Was wearing 14 mg nicotine patch, taking off overnight.  Motivations to quit smoking: doesn't like the cost, doesn't like the smell. Reports she doesn't have any other vices/ways to celebrate. Does report improvement in stress moving forward, so she anticipates being able to think about quitting again moving forward.  Motivational interviewing provided. Discussed ways of navigating stress instead of smoking. She reports she likes to walk, so now that the weather is improving she hopes to do that more.   Patient Goals/Self-Care Activities Over the next 90 days, patient will:  - take medications as prescribed check blood pressure daily, document, and provide at future appointments  Follow Up Plan: Telephone follow up appointment with care management team member scheduled for: offered 3 month follow up or closing CCM case. Patient would like to continue engagement, requests a 6 month follow up      Medication Assistance: None required.  Patient affirms current coverage meets needs.  Patient's preferred pharmacy is:  [id number]-  [place], [place] [id number]: [phone]: [phone]  Follow Up:  Patient agrees to Care Plan and Follow-up.  Plan: Telephone follow up appointment with care management team member scheduled for:  recommended f/u in 3 months, but patient requested 6 month follow up   Catie TDarnelle Maffucci PharmD, BOliver CDeepwaterPharmacist LOccidental Petroleumat BS[phone]

## 2020-10-24 DIAGNOSIS — I480 Paroxysmal atrial fibrillation: Secondary | ICD-10-CM

## 2020-10-24 DIAGNOSIS — E782 Mixed hyperlipidemia: Secondary | ICD-10-CM | POA: Diagnosis not present

## 2020-10-24 DIAGNOSIS — I10 Essential (primary) hypertension: Secondary | ICD-10-CM | POA: Diagnosis not present

## 2020-10-30 ENCOUNTER — Encounter: Payer: Self-pay | Admitting: Orthopedic Surgery

## 2020-11-04 ENCOUNTER — Ambulatory Visit: Payer: Medicare PPO | Admitting: Family Medicine

## 2021-01-20 ENCOUNTER — Ambulatory Visit: Payer: Medicare PPO | Admitting: Family Medicine

## 2021-01-27 ENCOUNTER — Ambulatory Visit: Payer: Medicare PPO | Admitting: Family Medicine

## 2021-02-20 ENCOUNTER — Encounter: Payer: Self-pay | Admitting: Family Medicine

## 2021-02-20 ENCOUNTER — Ambulatory Visit: Payer: Medicare PPO | Admitting: Family Medicine

## 2021-02-20 ENCOUNTER — Other Ambulatory Visit: Payer: Self-pay

## 2021-02-20 DIAGNOSIS — W5503XA Scratched by cat, initial encounter: Secondary | ICD-10-CM | POA: Diagnosis not present

## 2021-02-20 DIAGNOSIS — G5603 Carpal tunnel syndrome, bilateral upper limbs: Secondary | ICD-10-CM | POA: Diagnosis not present

## 2021-02-20 DIAGNOSIS — I10 Essential (primary) hypertension: Secondary | ICD-10-CM | POA: Diagnosis not present

## 2021-02-20 DIAGNOSIS — G5622 Lesion of ulnar nerve, left upper limb: Secondary | ICD-10-CM

## 2021-02-20 DIAGNOSIS — F17210 Nicotine dependence, cigarettes, uncomplicated: Secondary | ICD-10-CM

## 2021-02-20 DIAGNOSIS — I480 Paroxysmal atrial fibrillation: Secondary | ICD-10-CM | POA: Diagnosis not present

## 2021-02-20 DIAGNOSIS — G5620 Lesion of ulnar nerve, unspecified upper limb: Secondary | ICD-10-CM | POA: Insufficient documentation

## 2021-02-20 MED ORDER — LOSARTAN POTASSIUM 25 MG PO TABS: 25.0000 mg | ORAL_TABLET | Freq: Every day | ORAL | 3 refills | Status: DC

## 2021-02-20 MED ORDER — ROSUVASTATIN CALCIUM 40 MG PO TABS: 40.0000 mg | ORAL_TABLET | Freq: Every day | ORAL | 3 refills | Status: DC

## 2021-02-20 NOTE — Assessment & Plan Note (Signed)
Sinus rhythm today.  She will continue Eliquis 5 mg twice daily.  She can continue Lopressor as needed as prescribed by cardiology.

## 2021-02-20 NOTE — Assessment & Plan Note (Signed)
Chronic issue.  I encouraged her to wear her brace at night.  Discussed she could tolerate the brace she could try wrapping her elbow in a towel.  She will let us or orthopedics know if she has any progression of symptoms.

## 2021-02-20 NOTE — Assessment & Plan Note (Signed)
Patient has been evaluated by orthopedics.  I encouraged her to continue her cock up splints at night.  If she has any progression of symptoms she will let us or orthopedics know.

## 2021-02-20 NOTE — Assessment & Plan Note (Signed)
Encourage smoking cessation.  The patient is not quite ready to quit.

## 2021-02-20 NOTE — Patient Instructions (Addendum)
Nice to see you. Please continue to wear your wrist splints and your elbow brace. Please continue to monitor your blood pressure.

## 2021-02-20 NOTE — Assessment & Plan Note (Signed)
Appears to be well-healed with no signs of infection.  She will monitor.  Advised that if she ever gets bitten by a cat she needs to let us know given the need for antibiotics.

## 2021-02-20 NOTE — Assessment & Plan Note (Signed)
Well-controlled at home.  She will continue losartan 25 mg once daily.  She will continue to monitor her blood pressure.

## 2021-02-20 NOTE — Progress Notes (Signed)
[name], MD Phone: [phone]  Donna Rios is a 71 y.o. female who presents today for f/u.  HYPERTENSION Disease Monitoring Home BP Monitoring 120s-130s/70s-80s Chest pain- no    Dyspnea- no Medications Compliance-  taking losartan.  BMET    Component Value Date/Time   NA 140 09/19/2020 1052   K 4.3 09/19/2020 1052   CL 105 09/19/2020 1052   CO2 27 09/19/2020 1052   GLUCOSE 86 09/19/2020 1052   BUN 10 09/19/2020 1052   CREATININE 0.69 09/19/2020 1052   CREATININE 0.75 05/06/2016 0959   CALCIUM 9.4 09/19/2020 1052   GFRNONAA >60 07/25/2018 1121   GFRNONAA 84 05/06/2016 0959   GFRAA >60 07/25/2018 1121   GFRAA >89 05/06/2016 0959   Atrial fibrillation: Patient notes that she had some persistent palpitations when taking prednisone though those have resolved since coming off of that.  She occasionally will have short lasting palpitations.  She is taking Eliquis.  No bleeding issues.  She has Lopressor to take as needed.  She notes she has an echo scheduled in April through cardiology.  Carpal tunnel/ulnar neuropathy: Patient saw orthopedics.  They discussed surgery though the patient wanted to avoid this.  They advised her to wear her wrist braces and she got a elbow brace online.  She has a hard time keeping the elbow brace on at night.  Nicotine dependence, cigarettes: She continues to smoke.  She is not ready to quit.  Cat clawed her right forearm: Patient notes this happened over a week ago.  She cleansed it with peroxide.  No signs of infection.  Social History   Tobacco Use  Smoking Status Some Days   Packs/day: 0.50   Years: 46.00   Pack years: 23.00   Types: Cigarettes  Smokeless Tobacco Never    Current Outpatient Medications on File Prior to Visit  Medication Sig Dispense Refill   apixaban (ELIQUIS) 5 MG TABS tablet Take 1 tablet (5 mg total) by mouth 2 (two) times daily. 180 tablet 3   APPLE CIDER VINEGAR PO Take 1 tablet by mouth daily as needed  (with fatty foods).      b complex vitamins tablet Take 1 tablet by mouth 2 (two) times a week.      Calcium-Magnesium-Vitamin D (CALCIUM 1200+D3 PO) Take 1 tablet by mouth daily.     cetirizine (ZYRTEC) 10 MG tablet Take 10 mg by mouth as needed.      fluticasone (FLONASE) 50 MCG/ACT nasal spray Place 1 spray into both nostrils daily as needed for allergies.      Lidocaine 4 % SOLN Apply topically as needed.     Multiple Vitamin (MULTIVITAMIN WITH MINERALS) TABS tablet Take 1 tablet by mouth daily. Women's Complete Multivitamin 50+     Probiotic Product (PROBIOTIC ADVANCED PO) Take 1 tablet by mouth every other day.     Propylene Glycol (SYSTANE COMPLETE OP) Place 1 drop into both eyes 3 (three) times daily as needed (dry/irritated eyes.).      No current facility-administered medications on file prior to visit.     ROS see history of present illness  Objective  Physical Exam Vitals:   02/20/21 1032 02/20/21 1056  BP: (!) 150/80 140/80  Pulse: 83   Temp: 98.1 F (36.7 C)   SpO2: 99%     BP Readings from Last 3 Encounters:  02/20/21 140/80  09/19/20 118/80  07/15/20 130/80   Wt Readings from Last 3 Encounters:  02/20/21 189 lb 3.2 oz (85.8  kg)  09/19/20 194 lb (88 kg)  07/15/20 192 lb (87.1 kg)    Physical Exam Constitutional:      General: She is not in acute distress.    Appearance: She is not diaphoretic.  Cardiovascular:     Rate and Rhythm: Normal rate and regular rhythm.     Heart sounds: Normal heart sounds.  Pulmonary:     Effort: Pulmonary effort is normal.     Breath sounds: Normal breath sounds.  Skin:    General: Skin is warm and dry.       Neurological:     Mental Status: She is alert.     Assessment/Plan: Please see individual problem list.  Problem List Items Addressed This Visit     Carpal tunnel syndrome, bilateral    Patient has been evaluated by orthopedics.  I encouraged her to continue her cock up splints at night.  If she has any  progression of symptoms she will let us or orthopedics know.      Cat scratch    Appears to be well-healed with no signs of infection.  She will monitor.  Advised that if she ever gets bitten by a cat she needs to let us know given the need for antibiotics.      Cubital tunnel syndrome    Chronic issue.  I encouraged her to wear her brace at night.  Discussed she could tolerate the brace she could try wrapping her elbow in a towel.  She will let us or orthopedics know if she has any progression of symptoms.      Hypertension    Well-controlled at home.  She will continue losartan 25 mg once daily.  She will continue to monitor her blood pressure.      Relevant Medications   losartan (COZAAR) 25 MG tablet   rosuvastatin (CRESTOR) 40 MG tablet   Nicotine dependence, cigarettes, uncomplicated    Encourage smoking cessation.  The patient is not quite ready to quit.      Paroxysmal atrial fibrillation (HCC)    Sinus rhythm today.  She will continue Eliquis 5 mg twice daily.  She can continue Lopressor as needed as prescribed by cardiology.      Relevant Medications   losartan (COZAAR) 25 MG tablet   rosuvastatin (CRESTOR) 40 MG tablet    Return in about 4 months (around 06/20/2021) for Hypertension with labs.  This visit occurred during the SARS-CoV-2 public health emergency.  Safety protocols were in place, including screening questions prior to the visit, additional usage of staff PPE, and extensive cleaning of exam room while observing appropriate contact time as indicated for disinfecting solutions.    [name], MD [place]

## 2021-03-30 ENCOUNTER — Ambulatory Visit: Payer: Self-pay | Admitting: Pharmacist

## 2021-03-30 NOTE — Chronic Care Management (AMB) (Signed)
?  Chronic Care Management  ? ?Note ? ?03/30/2021 ?Name: Donna Rios MRN: [id number] DOB: 1950-04-02 ? ? ? ?Closing pharmacy CCM case at this time. Patient has clinic contact information for future questions or concerns.  ? ?Catie Darnelle Maffucci, PharmD, [place], CPP ?Clinical Pharmacist ?Therapist, music at [employer] ?[phone] ? ?

## 2021-04-08 ENCOUNTER — Telehealth: Payer: Medicare PPO

## 2021-04-14 ENCOUNTER — Telehealth: Payer: Self-pay | Admitting: Acute Care

## 2021-04-14 NOTE — Telephone Encounter (Signed)
Pt left VMM at the [phone] to schedule LDCT.   Attempted to reach pt - first call was answered with no one speaking then hung up.  Second call went to VM with message left. ?

## 2021-04-14 NOTE — Telephone Encounter (Signed)
Called to schedule annual LDCT.  Pt not available but left contact information for pt to call to schedule appt. ?

## 2021-04-30 ENCOUNTER — Ambulatory Visit (INDEPENDENT_AMBULATORY_CARE_PROVIDER_SITE_OTHER): Payer: Medicare PPO

## 2021-04-30 DIAGNOSIS — I350 Nonrheumatic aortic (valve) stenosis: Secondary | ICD-10-CM | POA: Diagnosis not present

## 2021-04-30 DIAGNOSIS — Z952 Presence of prosthetic heart valve: Secondary | ICD-10-CM | POA: Diagnosis not present

## 2021-04-30 LAB — ECHOCARDIOGRAM COMPLETE
AR max vel: 2.08 cm2
AV Area VTI: 2.36 cm2
AV Area mean vel: 2.13 cm2
AV Mean grad: 9 mmHg
AV Peak grad: 18.8 mmHg
Ao pk vel: 2.17 m/s
Area-P 1/2: 2.13 cm2
Calc EF: 51 %
Single Plane A2C EF: 49 %
Single Plane A4C EF: 53.6 %

## 2021-06-01 NOTE — Progress Notes (Signed)
Cardiology Office Note ? ?Date:  06/02/2021  ? ?ID:  ANABELLE BUNGERT, DOB [birth year], MRN [id number] ? ?PCP:  [name], MD  ? ?Chief Complaint  ?Patient presents with  ? 12 month follow up   ?  Discuss Echo results. "Doing well." Medications reviewed by the patient verbally.   ? ? ?HPI:  ?Ms. Keandra Medero is a 71 y.o. woman with past medical history of ?Hyperlipidemia ?Smoking,  ?Severe aortic valve stenosis, TAVR ?Venous insufficiency  ?Cath in 05/2018:no significant CAD ?Who presents to the office today for follow-up of her bicuspid AoV with severe AS s/p TAVR (07/11/18), PAF on eliquis ? ?LOV 4/22 ?Doing well, no acute changes today ? ?Echocardiogram performed April 30, 2021, results discussed in detail ?Normal ejection fraction ?Right ventricle normal size normal function ?TAVR valve functioning well, appropriate gradient ? ?Problems in her hands, numb hands ?May need surgery, she is concerned about recovery ?Uses a cane , unsteady on her feet ?Prior falls, fell Thursday last week, difficulty getting up off the ground secondary to leg weakness.  No regular walking program ? ?Chronic backpain ?Also with hip pain,  ? ?CT chest 10/21, previously reviewed ?aortic atherosclerosis, very mild in arch ? ?No recent runs of atrial fibrillation or symptoms concerning for arrhythmia ? ?EKG personally reviewed by myself on todays visit ?Nsr rate 74 bpm no St or T wave changes ? ?Other past medical history reviewed ?Runs of atrial fib 4/2/202, was taking prednisone for back pain ?Rate 130 to 140 ?Tolerating her Eliquis twice daily ? ?Echo 06/2019: ?EF normal, stable TAVR ? ?echo ? 07/11/18. TAVR with a 26 mm Edwards Sapien 3 THV via the TF approach on She had a brief run of Afib and was cardioverted back to sinus rhythm ? ?zio did show atrial fib, paroxysmal  ?CHADS VASC probably 4  ? ?Treadmill at home, but does not use it ? ? echocardiogram 04/10/2018.  ? ejection fraction is normal (60 - 65%). The right side was also  normal. Her aortic valve appears to have two leaflets rather than three. It had a valve area of 0.79 cm.  Markedly elevated mean and peak systolic pressure ? ? ?PMH:   has a past medical history of Allergic rhinitis, Arthritis, Fatty liver, Hypertension, Phlebitis, S/P TAVR (transcatheter aortic valve replacement), Severe aortic stenosis, Stress incontinence, and Tobacco abuse.  ?Aortic valve stenosis ?Smoker ? ?PSH:    ?Past Surgical History:  ?Procedure Laterality Date  ? ABDOMINAL HYSTERECTOMY    ? BREAST BIOPSY Left   ? ducts removed  ? Cataract surgery    ? COLONOSCOPY WITH PROPOFOL N/A 09/29/2017  ? Procedure: COLONOSCOPY WITH PROPOFOL;  Surgeon: Lin Landsman, MD;  Location: [facility] ENDOSCOPY;  Service: Gastroenterology;  Laterality: N/A;  ? EYE SURGERY    ? FOOT SURGERY    ? ?7  ? INTRAOPERATIVE TRANSTHORACIC ECHOCARDIOGRAM N/A 07/11/2018  ? Procedure: Intraoperative Transthoracic Echocardiogram;  Surgeon: Sherren Mocha, MD;  Location: [place];  Service: Open Heart Surgery;  Laterality: N/A;  ? KNEE ARTHROSCOPY Right   ? LAPAROSCOPIC HYSTERECTOMY    ? RIGHT HEART CATH AND CORONARY ANGIOGRAPHY N/A 06/15/2018  ? Procedure: RIGHT HEART CATH AND CORONARY ANGIOGRAPHY;  Surgeon: Sherren Mocha, MD;  Location: [place] [facility];  Service: Cardiovascular;  Laterality: N/A;  ? TRANSCATHETER AORTIC VALVE REPLACEMENT, TRANSFEMORAL  07/11/2018  ? TRANSCATHETER AORTIC VALVE REPLACEMENT, TRANSFEMORAL N/A 07/11/2018  ? Procedure: TRANSCATHETER AORTIC VALVE REPLACEMENT, TRANSFEMORAL;  Surgeon: Sherren Mocha, MD;  Location[facility];  Service: Open Heart Surgery;  Laterality: N/A;  ? VARICOSE VEIN SURGERY    ? ? ?Current Outpatient Medications  ?Medication Sig Dispense Refill  ? apixaban (ELIQUIS) 5 MG TABS tablet Take 1 tablet (5 mg total) by mouth 2 (two) times daily. 180 tablet 3  ? APPLE CIDER VINEGAR PO Take 1 tablet by mouth daily as needed (with fatty foods).     ? b complex vitamins tablet Take 1 tablet by mouth 2  (two) times a week.     ? Calcium-Magnesium-Vitamin D (CALCIUM 1200+D3 PO) Take 1 tablet by mouth daily.    ? cetirizine (ZYRTEC) 10 MG tablet Take 10 mg by mouth as needed.     ? fluticasone (FLONASE) 50 MCG/ACT nasal spray Place 1 spray into both nostrils daily as needed for allergies.     ? Lidocaine 4 % SOLN Apply topically as needed.    ? losartan (COZAAR) 25 MG tablet Take 1 tablet (25 mg total) by mouth daily. 90 tablet 3  ? metoprolol tartrate (LOPRESSOR) 25 MG tablet Take 25 mg by mouth 2 (two) times daily. (For fast heart rate >100, a-fib).    ? Multiple Vitamin (MULTIVITAMIN WITH MINERALS) TABS tablet Take 1 tablet by mouth daily. Women's Complete Multivitamin 50+    ? Probiotic Product (PROBIOTIC ADVANCED PO) Take 1 tablet by mouth every other day.    ? Propylene Glycol (SYSTANE COMPLETE OP) Place 1 drop into both eyes 3 (three) times daily as needed (dry/irritated eyes.).     ? rosuvastatin (CRESTOR) 40 MG tablet Take 1 tablet (40 mg total) by mouth daily. 90 tablet 3  ? ?No current facility-administered medications for this visit.  ? ? ? ?Allergies:   Ibuprofen, Asa [aspirin], Iohexol, and Prednisone  ? ?Social History:  The patient  reports that she has been smoking cigarettes. She has a 23.00 pack-year smoking history. She has never used smokeless tobacco. She reports that she does not currently use alcohol. She reports that she does not use drugs.  ? ?Family History:   family history includes Alcoholism in an other family member; Arthritis in an other family member; Diabetes in her mother and another family member; Heart disease in an other family member; Heart failure in her father and mother; Hypertension in her mother and another family member; Hypotension in her father; Lung cancer in an other family member; Stroke in an other family member.  ? ? ?Review of Systems: ?Review of Systems  ?Constitutional: Negative.   ?HENT: Negative.    ?Eyes: Negative.   ?Respiratory: Negative.    ?Cardiovascular:  Negative.   ?Gastrointestinal: Negative.   ?Genitourinary: Negative.   ?Musculoskeletal: Negative.   ?     Numbness in her hands  ?Neurological: Negative.   ?Psychiatric/Behavioral: Negative.    ?All other systems reviewed and are negative. ? ?PHYSICAL EXAM: ?VS:  BP 140/70 (BP Location: Left Arm, Patient Position: Sitting, Cuff Size: Normal)   Pulse 74   Ht '5\' 8"'$  (1.727 m)   Wt 185 lb 4 oz (84 kg)   SpO2 98%   BMI 28.17 kg/m?  , BMI Body mass index is 28.17 kg/m?Marland Kitchen ?Constitutional:  oriented to person, place, and time. No distress.  ?HENT:  ?Head: Grossly normal ?Eyes:  no discharge. No scleral icterus.  ?Neck: No JVD, no carotid bruits  ?Cardiovascular: Regular rate and rhythm, no murmurs appreciated ?Pulmonary/Chest: Clear to auscultation bilaterally, no wheezes or rails ?Abdominal: Soft.  no distension.  no tenderness.  ?Musculoskeletal:  Normal range of motion ?Neurological:  normal muscle tone. Coordination normal. No atrophy ?Skin: Skin warm and dry ?Psychiatric: normal affect, pleasant ? ?Recent Labs: ?07/04/2020: Hemoglobin 13.7; Platelets 180.0 ?09/19/2020: ALT 15; BUN 10; Creatinine, Ser 0.69; Potassium 4.3; Sodium 140  ? ? ?Lipid Panel ?Lab Results  ?Component Value Date  ? CHOL 138 07/04/2020  ? HDL 52.70 07/04/2020  ? [place] 51 07/04/2020  ? TRIG 174.0 (H) 07/04/2020  ? ?  ? ?Wt Readings from Last 3 Encounters:  ?06/02/21 185 lb 4 oz (84 kg)  ?02/20/21 189 lb 3.2 oz (85.8 kg)  ?09/19/20 194 lb (88 kg)  ?  ? ?ASSESSMENT AND PLAN: ? ?Severe aortic valve stenosis ?S/p TAVR ?Echo 04/2021, stable valve ?Stable ? ?Aortic atherosclerosis ?mild disease noted on evaluation of her CT,  ?Lipids at goal ? ?Paroxysmal atrial fibrillation ?Seen on event monitor,  eliquis 5 BID, Rare fall ?No recent episodes ?No med changes ? ?Tobacco abuse ?We have encouraged her to continue to work on weaning her cigarettes and smoking cessation. She will continue to work on this and does not want any assistance with chantix.    ? ?Prediabetes ?A1C 5.9 weight stable ?Walking program ? ?Mixed hyperlipidemia ?Cholesterol is at goal on the current lipid regimen. No changes to the medications were made. ? ?Essential HTN ?Blood pressure is well

## 2021-06-02 ENCOUNTER — Encounter: Payer: Self-pay | Admitting: Cardiovascular Disease

## 2021-06-02 ENCOUNTER — Ambulatory Visit: Payer: Medicare PPO | Admitting: Cardiovascular Disease

## 2021-06-02 VITALS — BP 140/70 | HR 74 | Ht 68.0 in | Wt 185.2 lb

## 2021-06-02 DIAGNOSIS — E782 Mixed hyperlipidemia: Secondary | ICD-10-CM | POA: Diagnosis not present

## 2021-06-02 DIAGNOSIS — I10 Essential (primary) hypertension: Secondary | ICD-10-CM | POA: Diagnosis not present

## 2021-06-02 DIAGNOSIS — Z952 Presence of prosthetic heart valve: Secondary | ICD-10-CM | POA: Diagnosis not present

## 2021-06-02 DIAGNOSIS — I350 Nonrheumatic aortic (valve) stenosis: Secondary | ICD-10-CM

## 2021-06-02 DIAGNOSIS — I2510 Atherosclerotic heart disease of native coronary artery without angina pectoris: Secondary | ICD-10-CM

## 2021-06-02 DIAGNOSIS — Z720 Tobacco use: Secondary | ICD-10-CM | POA: Diagnosis not present

## 2021-06-02 DIAGNOSIS — I480 Paroxysmal atrial fibrillation: Secondary | ICD-10-CM | POA: Diagnosis not present

## 2021-06-02 DIAGNOSIS — I700 Atherosclerosis of aorta: Secondary | ICD-10-CM | POA: Diagnosis not present

## 2021-06-02 MED ORDER — AMOXICILLIN 500 MG PO TABS: 2000.0000 mg | ORAL_TABLET | Freq: Once | ORAL | 0 refills | Status: AC

## 2021-06-02 NOTE — Patient Instructions (Signed)
Medication Instructions:  No changes  If you need a refill on your cardiac medications before your next appointment, please call your pharmacy.   Lab work: No new labs needed  Testing/Procedures: No new testing needed  Follow-Up: At [facility], you and your health needs are our priority.  As part of our continuing mission to provide you with exceptional heart care, we have created designated Provider Care Teams.  These Care Teams include your primary Cardiologist (physician) and Advanced Practice Providers (APPs -  Physician Assistants and Nurse Practitioners) who all work together to provide you with the care you need, when you need it.  You will need a follow up appointment in 12 months  Providers on your designated Care Team:   Christopher Berge, NP Ryan Dunn, PA-C Cadence Furth, PA-C  COVID-19 Vaccine Information can be found at: [email or web address] For questions related to vaccine distribution or appointments, please email [email or web address] or call [phone].   

## 2021-06-11 ENCOUNTER — Ambulatory Visit (INDEPENDENT_AMBULATORY_CARE_PROVIDER_SITE_OTHER): Payer: Medicare PPO

## 2021-06-11 VITALS — HR 70 | Ht 68.0 in | Wt 185.0 lb

## 2021-06-11 DIAGNOSIS — Z0000 Encounter for general adult medical examination without abnormal findings: Secondary | ICD-10-CM

## 2021-06-11 NOTE — Patient Instructions (Addendum)
  Ms. Guest , Thank you for taking time to come for your Medicare Wellness Visit. I appreciate your ongoing commitment to your health goals. Please review the following plan we discussed and let me know if I can assist you in the future.   These are the goals we discussed:  Goals       Patient Stated     Increase physical activity (pt-stated)      Walk for exercise Stay hydrated        This is a list of the screening recommended for you and due dates:  Health Maintenance  Topic Date Due   COVID-19 Vaccine (3 - Moderna risk series) 06/27/2021*   Flu Shot  08/25/2021   Mammogram  11/07/2021   Tetanus Vaccine  05/07/2026   Colon Cancer Screening  09/30/2027   Pneumonia Vaccine  Completed   DEXA scan (bone density measurement)  Completed   Hepatitis C Screening: USPSTF Recommendation to screen - Ages 38-79 yo.  Completed   Zoster (Shingles) Vaccine  Completed   HPV Vaccine  Aged Out   Cologuard (Stool DNA test)  Discontinued  *Topic was postponed. The date shown is not the original due date.

## 2021-06-11 NOTE — Progress Notes (Signed)
Subjective:   Donna Rios is a 71 y.o. female who presents for Medicare Annual (Subsequent) preventive examination.  Review of Systems    No ROS.  Medicare Wellness Virtual Visit.  Visual/audio telehealth visit, UTA vital signs.   See social history for additional risk factors.   Cardiac Risk Factors include: advanced age (>27mn, >>3women)     Objective:    Today's Vitals   06/11/21 1118  Pulse: 70  Weight: 185 lb (83.9 kg)  Height: '5\' 8"'$  (1.727 m)   Body mass index is 28.13 kg/m.     06/11/2020   11:35 AM 06/11/2019   11:40 AM 07/11/2018    5:06 PM 07/07/2018    9:35 AM 06/21/2018   12:43 PM 06/15/2018   12:15 PM 06/09/2018    9:39 AM  Advanced Directives  Does Patient Have a Medical Advance Directive? No No No No No No No  Does patient want to make changes to medical advance directive? Yes (MAU/Ambulatory/Procedural Areas - Information given)        Would patient like information on creating a medical advance directive?  No - Patient declined No - Patient declined No - Patient declined No - Patient declined Yes (MAU/Ambulatory/Procedural Areas - Information given) Yes (MAU/Ambulatory/Procedural Areas - Information given)    Current Medications (verified) Outpatient Encounter Medications as of 06/11/2021  Medication Sig   apixaban (ELIQUIS) 5 MG TABS tablet Take 1 tablet (5 mg total) by mouth 2 (two) times daily.   APPLE CIDER VINEGAR PO Take 1 tablet by mouth daily as needed (with fatty foods).    b complex vitamins tablet Take 1 tablet by mouth 2 (two) times a week.    Calcium-Magnesium-Vitamin D (CALCIUM 1200+D3 PO) Take 1 tablet by mouth daily.   cetirizine (ZYRTEC) 10 MG tablet Take 10 mg by mouth as needed.    fluticasone (FLONASE) 50 MCG/ACT nasal spray Place 1 spray into both nostrils daily as needed for allergies.    Lidocaine 4 % SOLN Apply topically as needed.   losartan (COZAAR) 25 MG tablet Take 1 tablet (25 mg total) by mouth daily.   metoprolol  tartrate (LOPRESSOR) 25 MG tablet Take 25 mg by mouth 2 (two) times daily. (For fast heart rate >100, a-fib).   Multiple Vitamin (MULTIVITAMIN WITH MINERALS) TABS tablet Take 1 tablet by mouth daily. Women's Complete Multivitamin 50+   Probiotic Product (PROBIOTIC ADVANCED PO) Take 1 tablet by mouth every other day.   Propylene Glycol (SYSTANE COMPLETE OP) Place 1 drop into both eyes 3 (three) times daily as needed (dry/irritated eyes.).    rosuvastatin (CRESTOR) 40 MG tablet Take 1 tablet (40 mg total) by mouth daily.   No facility-administered encounter medications on file as of 06/11/2021.    Allergies (verified) Ibuprofen, Asa [aspirin], Iohexol, and Prednisone   History: Past Medical History:  Diagnosis Date   Allergic rhinitis    Arthritis    Fatty liver    Hypertension    Phlebitis    S/P TAVR (transcatheter aortic valve replacement)    Severe aortic stenosis    Stress incontinence    Tobacco abuse    Past Surgical History:  Procedure Laterality Date   ABDOMINAL HYSTERECTOMY     BREAST BIOPSY Left    ducts removed   Cataract surgery     COLONOSCOPY WITH PROPOFOL N/A 09/29/2017   Procedure: COLONOSCOPY WITH PROPOFOL;  Surgeon: VLin Landsman MD;  Location: [facility] ENDOSCOPY;  Service: Gastroenterology;  Laterality: N/A;  EYE SURGERY     FOOT SURGERY     7   INTRAOPERATIVE TRANSTHORACIC ECHOCARDIOGRAM N/A 07/11/2018   Procedure: Intraoperative Transthoracic Echocardiogram;  Surgeon: Sherren Mocha, MD;  Location: [place];  Service: Open Heart Surgery;  Laterality: N/A;   KNEE ARTHROSCOPY Right    LAPAROSCOPIC HYSTERECTOMY     RIGHT HEART CATH AND CORONARY ANGIOGRAPHY N/A 06/15/2018   Procedure: RIGHT HEART CATH AND CORONARY ANGIOGRAPHY;  Surgeon: Sherren Mocha, MD;  Location: [place] [facility];  Service: Cardiovascular;  Laterality: N/A;   TRANSCATHETER AORTIC VALVE REPLACEMENT, TRANSFEMORAL  07/11/2018   TRANSCATHETER AORTIC VALVE REPLACEMENT, TRANSFEMORAL N/A  07/11/2018   Procedure: TRANSCATHETER AORTIC VALVE REPLACEMENT, TRANSFEMORAL;  Surgeon: Sherren Mocha, MD;  Location: [place];  Service: Open Heart Surgery;  Laterality: N/A;   VARICOSE VEIN SURGERY     Family History  Problem Relation Age of Onset   Alcoholism Other    Arthritis Other    Lung cancer Other    Heart disease Other    Stroke Other    Hypertension Other    Diabetes Other    Heart failure Mother    Hypertension Mother    Diabetes Mother    Heart failure Father    Hypotension Father    Breast cancer Neg Hx    Social History   Socioeconomic History   Marital status: Married    Spouse name: Not on file   Number of children: Not on file   Years of education: Not on file   Highest education level: Not on file  Occupational History   Not on file  Tobacco Use   Smoking status: Every Day    Packs/day: 0.50    Years: 46.00    Pack years: 23.00    Types: Cigarettes   Smokeless tobacco: Never  Vaping Use   Vaping Use: Never used  Substance and Sexual Activity   Alcohol use: Not Currently    Alcohol/week: 0.0 standard drinks   Drug use: No   Sexual activity: Not on file  Other Topics Concern   Not on file  Social History Narrative   Not on file   Social Determinants of Health   Financial Resource Strain: Low Risk    Difficulty of Paying Living Expenses: Not hard at all  Food Insecurity: No Food Insecurity   Worried About Charity fundraiser in the Last Year: Never true   Ran Out of Food in the Last Year: Never true  Transportation Needs: No Transportation Needs   Lack of Transportation (Medical): No   Lack of Transportation (Non-Medical): No  Physical Activity: Insufficiently Active   Days of Exercise per Week: 3 days   Minutes of Exercise per Session: 20 min  Stress: No Stress Concern Present   Feeling of Stress : Not at all  Social Connections: Unknown   Frequency of Communication with Friends and Family: Not on file   Frequency of Social Gatherings  with Friends and Family: Not on file   Attends Religious Services: Not on Electrical engineer or Organizations: Not on file   Attends Archivist Meetings: Not on file   Marital Status: Married    Tobacco Counseling Ready to quit: Not Answered Counseling given: Not Answered   Clinical Intake:  Pre-visit preparation completed: Yes        Diabetes: No  How often do you need to have someone help you when you read instructions, pamphlets, or other written materials from  your doctor or pharmacy?: 1 - Never  Interpreter Needed?: No    Activities of Daily Living    06/11/2021   11:19 AM  In your present state of health, do you have any difficulty performing the following activities:  Hearing? 0  Vision? 0  Difficulty concentrating or making decisions? 0  Walking or climbing stairs? 1  Comment Cane in use when walking  Dressing or bathing? 0  Doing errands, shopping? 0  Preparing Food and eating ? N  Using the Toilet? N  In the past six months, have you accidently leaked urine? N  Do you have problems with loss of bowel control? N  Managing your Medications? N  Managing your Finances? N  Housekeeping or managing your Housekeeping? N   Patient Care Team: [name], MD as PCP - General (Family Medicine)  Indicate any recent Medical Services you may have received from other than [facility] providers in the past year (date may be approximate).     Assessment:   This is a routine wellness examination for Donna Rios.  Virtual Visit via Telephone Note  I connected with  [place] on 06/11/21 at 11:15 AM EDT by telephone and verified that I am speaking with the correct person using two identifiers.  Persons participating in the virtual visit: patient/Nurse Health Advisor   I discussed the limitations of performing an evaluation and management service by telehealth. We continued and completed visit with audio only. Some vital signs may be absent or  patient reported.   Hearing/Vision screen Hearing Screening - Comments:: Patient is able to hear conversational tones without difficulty. No issues reported. Vision Screening - Comments:: Wears corrective lenses when reading  Cataract extraction, bilateral They have seen their ophthalmologist in the last 12 months.  Dietary issues and exercise activities discussed: Current Exercise Habits: Home exercise routine (Bed exercise for leg strengthening), Type of exercise: walking, Time (Minutes): 20, Frequency (Times/Week): 3, Weekly Exercise (Minutes/Week): 60, Intensity: Mild Regular diet Good water intake   Goals Addressed               This Visit's Progress     Patient Stated     Increase physical activity (pt-stated)   On track     Walk for exercise Stay hydrated       Depression Screen    06/11/2021   11:29 AM 02/20/2021   10:35 AM 09/19/2020   10:23 AM 06/11/2020   11:31 AM 09/18/2019    9:26 AM 06/11/2019   11:54 AM 01/29/2019   11:15 AM  PHQ 2/9 Scores  PHQ - 2 Score 0 0 0 0 0 0 0    Fall Risk    06/11/2021   11:27 AM 02/20/2021   10:33 AM 09/19/2020   10:22 AM 06/11/2020   11:36 AM 09/18/2019    9:36 AM  Fall Risk   Falls in the past year? 1 0 0 0 1  Number falls in past yr: 0 0 0 0 1  Injury with Fall? 0 0  0 1  Risk for fall due to : Impaired balance/gait No Fall Risks     Risk for fall due to: Comment Cane in use when ambulating      Follow up Falls evaluation completed Falls evaluation completed Falls evaluation completed Falls evaluation completed Falls evaluation completed   [place]: Home free of loose throw rugs in walkways, pet beds, electrical cords, etc? Yes  Adequate lighting in your  home to reduce risk of falls? Yes   ASSISTIVE DEVICES UTILIZED TO PREVENT FALLS: Life alert? No  Use of a cane, walker or w/c? Yes  Grab bars in the bathroom? Yes  Shower chair or bench in shower? Yes  Elevated toilet seat ? Yes    TIMED UP AND GO: Was the test performed? No .   Cognitive Function: Patient is alert and oriented x3.  Enjoys puzzles and reading for brain health exercise.     06/07/2017    9:36 AM 05/06/2016   10:23 AM  MMSE - Mini Mental State Exam  Orientation to time 5 5  Orientation to Place 5 5  Registration 3 3  Attention/ Calculation 5 5  Recall 3 2  Language- name 2 objects 2 2  Language- repeat 1 1  Language- follow 3 step command 3 3  Language- read & follow direction 1 1  Write a sentence 1 1  Copy design 1 1  Total score 30 29        06/11/2020   11:50 AM 06/11/2019   11:50 AM 06/09/2018    9:40 AM  6CIT Screen  What Year? 0 points 0 points 0 points  What month? 0 points 0 points 0 points  What time? 0 points 0 points 0 points  Count back from 20 0 points 0 points 0 points  Months in reverse 0 points 0 points 0 points  Repeat phrase  0 points 0 points  Total Score  0 points 0 points    Immunizations Immunization History  Administered Date(s) Administered   Fluad Quad(high Dose 65+) 10/09/2018   Influenza, High Dose Seasonal PF 11/06/2015, 10/06/2016, 11/06/2019, 11/05/2020   Influenza-Unspecified 10/05/2017   Moderna Covid-19 Vaccine Bivalent Booster 75yr & up 11/05/2020   Moderna SARS-COV2 Booster Vaccination 12/20/2019   Moderna Sars-Covid-2 Vaccination 04/18/2019, 05/19/2019   Pneumococcal Conjugate-13 05/06/2016   Pneumococcal Polysaccharide-23 06/07/2017, 08/15/2019   Tdap 05/06/2016   Zoster Recombinat (Shingrix) 08/15/2019, 10/18/2019   Screening Tests Health Maintenance  Topic Date Due   COVID-19 Vaccine (3 - Moderna risk series) 06/27/2021 (Originally 11/05/2020)   INFLUENZA VACCINE  08/25/2021   MAMMOGRAM  11/07/2021   TETANUS/TDAP  05/07/2026   COLONOSCOPY (Pts 45-463yrInsurance coverage will need to be confirmed)  09/30/2027   Pneumonia Vaccine 71Years old  Completed   DEXA SCAN  Completed   Hepatitis C Screening  Completed   Zoster  Vaccines- Shingrix  Completed   HPV VACCINES  Aged Out   Fecal DNA (Cologuard)  Discontinued   Health Maintenance There are no preventive care reminders to display for this patient.  Lung Cancer Screening: (Low Dose CT Chest recommended if Age 101-80ears, 30 pack-year currently smoking OR have quit w/in 15years.) does not qualify.   Vision Screening: Recommended annual ophthalmology exams for early detection of glaucoma and other disorders of the eye.  Dental Screening: Recommended annual dental exams for proper oral hygiene  Community Resource Referral / Chronic Care Management: CRR required this visit?  No   CCM required this visit?  No      Plan:   Keep all routine maintenance appointments.   I have personally reviewed and noted the following in the patient's chart:   Medical and social history Use of alcohol, tobacco or illicit drugs  Current medications and supplements including opioid prescriptions.  Functional ability and status Nutritional status Physical activity Advanced directives List of other physicians Hospitalizations, surgeries, and ER visits in previous 12 months  Vitals Screenings to include cognitive, depression, and falls Referrals and appointments  In addition, I have reviewed and discussed with patient certain preventive protocols, quality metrics, and best practice recommendations. A written personalized care plan for preventive services as well as general preventive health recommendations were provided to patient.     Varney Biles, LPN   03/20/1144

## 2021-06-23 ENCOUNTER — Encounter: Payer: Self-pay | Admitting: Family Medicine

## 2021-06-23 ENCOUNTER — Telehealth: Payer: Medicare PPO | Admitting: Family Medicine

## 2021-06-23 VITALS — Ht 68.0 in | Wt 185.0 lb

## 2021-06-23 DIAGNOSIS — I10 Essential (primary) hypertension: Secondary | ICD-10-CM | POA: Diagnosis not present

## 2021-06-23 DIAGNOSIS — R1032 Left lower quadrant pain: Secondary | ICD-10-CM

## 2021-06-23 DIAGNOSIS — W19XXXA Unspecified fall, initial encounter: Secondary | ICD-10-CM | POA: Insufficient documentation

## 2021-06-23 DIAGNOSIS — G8929 Other chronic pain: Secondary | ICD-10-CM | POA: Diagnosis not present

## 2021-06-23 DIAGNOSIS — M5441 Lumbago with sciatica, right side: Secondary | ICD-10-CM | POA: Diagnosis not present

## 2021-06-23 DIAGNOSIS — R82998 Other abnormal findings in urine: Secondary | ICD-10-CM | POA: Insufficient documentation

## 2021-06-23 DIAGNOSIS — R7303 Prediabetes: Secondary | ICD-10-CM

## 2021-06-23 DIAGNOSIS — E782 Mixed hyperlipidemia: Secondary | ICD-10-CM

## 2021-06-23 NOTE — Assessment & Plan Note (Signed)
Adequately controlled.  She will continue losartan 25 mg daily and metoprolol 25 mg twice daily.

## 2021-06-23 NOTE — Assessment & Plan Note (Signed)
Check urinalysis.  We will also check liver enzymes.

## 2021-06-23 NOTE — Assessment & Plan Note (Signed)
Encouraged continued exercise to try to prevent falls.

## 2021-06-23 NOTE — Assessment & Plan Note (Signed)
Continue Crestor 40 mg daily.  She is not quite due for a lipid panel.

## 2021-06-23 NOTE — Assessment & Plan Note (Signed)
Check A1c. 

## 2021-06-23 NOTE — Assessment & Plan Note (Signed)
Exacerbation after a fall.  We will get an x-ray given her radicular symptoms.

## 2021-06-23 NOTE — Progress Notes (Signed)
Virtual Visit via video Note  This visit type was conducted due to national recommendations for restrictions regarding the COVID-19 pandemic (e.g. social distancing).  This format is felt to be most appropriate for this patient at this time.  All issues noted in this document were discussed and addressed.  No physical exam was performed (except for noted visual exam findings with Video Visits).   I connected with Donna Rios today at 10:30 AM EDT by a video enabled telemedicine application and verified that I am speaking with the correct person using two identifiers. Location patient: home Location provider: home office Persons participating in the virtual visit: patient, provider  I discussed the limitations, risks, security and privacy concerns of performing an evaluation and management service by telephone and the availability of in person appointments. I also discussed with the patient that there may be a patient responsible charge related to this service. The patient expressed understanding and agreed to proceed.   Reason for visit: f/u.  HPI: HYPERTENSION Disease Monitoring Home BP Monitoring 120/83 Chest pain- no    Dyspnea- no Medications Compliance-  taking losartan, metoprolol.   BMET    Component Value Date/Time   NA 140 09/19/2020 1052   K 4.3 09/19/2020 1052   CL 105 09/19/2020 1052   CO2 27 09/19/2020 1052   GLUCOSE 86 09/19/2020 1052   BUN 10 09/19/2020 1052   CREATININE 0.69 09/19/2020 1052   CREATININE 0.75 05/06/2016 0959   CALCIUM 9.4 09/19/2020 1052   GFRNONAA >60 07/25/2018 1121   GFRNONAA 84 05/06/2016 0959   GFRAA >60 07/25/2018 1121   GFRAA >89 05/06/2016 0959   HYPERLIPIDEMIA Symptoms Chest pain on exertion:  no    Medications: Compliance- taking crestor Right upper quadrant pain- no Lipid Panel     Component Value Date/Time   CHOL 138 07/04/2020 1031   TRIG 174.0 (H) 07/04/2020 1031   HDL 52.70 07/04/2020 1031   CHOLHDL 3 07/04/2020 1031    VLDL 34.8 07/04/2020 1031   LDLCALC 51 07/04/2020 1031   LDLDIRECT 71.0 09/18/2019 1010   Fall: Patient reports she fell on 5/5.  She notes she lost her balance and did not want to let go of her cane or her phone to try to catch herself.  She fell onto her bottom.  She notes since then she has had some pain in her low back that radiates down her right posterior leg typically in the morning.  She notes there is a certain way that she can twist her leg that would bring the pain on.  She notes no numbness or weakness.  No bowel or bladder incontinence.  She is been taking Tylenol and using IcyHot which have been helpful.  Patient notes she has been working on walking more and doing core exercises to help prevent falls.  Urine color change: Patient reports recently noting an orange color to her urine.  She has been taking a new vitamin B12 gummy.  She notes no change in chronic urinary urgency.  No dysuria.  Left lower quadrant discomfort: Patient reports this comes on a few hours before needing to have a bowel movement.  Notes it resolves after having the bowel movement.  This has been an ongoing issue.  She reports her GI physician advised her that there was a narrowing in her intestines in the left lower quadrant.   ROS: See pertinent positives and negatives per HPI.  Past Medical History:  Diagnosis Date   Allergic rhinitis  Arthritis    Fatty liver    Hypertension    Phlebitis    S/P TAVR (transcatheter aortic valve replacement)    Severe aortic stenosis    Stress incontinence    Tobacco abuse     Past Surgical History:  Procedure Laterality Date   ABDOMINAL HYSTERECTOMY     BREAST BIOPSY Left    ducts removed   Cataract surgery     COLONOSCOPY WITH PROPOFOL N/A 09/29/2017   Procedure: COLONOSCOPY WITH PROPOFOL;  Surgeon: Lin Landsman, MD;  Location: [facility];  Service: Gastroenterology;  Laterality: N/A;   EYE SURGERY     FOOT SURGERY     7   INTRAOPERATIVE  TRANSTHORACIC ECHOCARDIOGRAM N/A 07/11/2018   Procedure: Intraoperative Transthoracic Echocardiogram;  Surgeon: Sherren Mocha, MD;  Location: [place];  Service: Open Heart Surgery;  Laterality: N/A;   KNEE ARTHROSCOPY Right    LAPAROSCOPIC HYSTERECTOMY     RIGHT HEART CATH AND CORONARY ANGIOGRAPHY N/A 06/15/2018   Procedure: RIGHT HEART CATH AND CORONARY ANGIOGRAPHY;  Surgeon: Sherren Mocha, MD;  Location: [place] [facility];  Service: Cardiovascular;  Laterality: N/A;   TRANSCATHETER AORTIC VALVE REPLACEMENT, TRANSFEMORAL  07/11/2018   TRANSCATHETER AORTIC VALVE REPLACEMENT, TRANSFEMORAL N/A 07/11/2018   Procedure: TRANSCATHETER AORTIC VALVE REPLACEMENT, TRANSFEMORAL;  Surgeon: Sherren Mocha, MD;  Location: [place];  Service: Open Heart Surgery;  Laterality: N/A;   VARICOSE VEIN SURGERY      Family History  Problem Relation Age of Onset   Alcoholism Other    Arthritis Other    Lung cancer Other    Heart disease Other    Stroke Other    Hypertension Other    Diabetes Other    Heart failure Mother    Hypertension Mother    Diabetes Mother    Heart failure Father    Hypotension Father    Breast cancer Neg Hx     SOCIAL HX: Smoker   Current Outpatient Medications:    apixaban (ELIQUIS) 5 MG TABS tablet, Take 1 tablet (5 mg total) by mouth 2 (two) times daily., Disp: 180 tablet, Rfl: 3   APPLE CIDER VINEGAR PO, Take 1 tablet by mouth daily as needed (with fatty foods). , Disp: , Rfl:    b complex vitamins tablet, Take 1 tablet by mouth 2 (two) times a week. , Disp: , Rfl:    Calcium-Magnesium-Vitamin D (CALCIUM 1200+D3 PO), Take 1 tablet by mouth daily., Disp: , Rfl:    cetirizine (ZYRTEC) 10 MG tablet, Take 10 mg by mouth as needed. , Disp: , Rfl:    fluticasone (FLONASE) 50 MCG/ACT nasal spray, Place 1 spray into both nostrils daily as needed for allergies. , Disp: , Rfl:    Lidocaine 4 % SOLN, Apply topically as needed., Disp: , Rfl:    losartan (COZAAR) 25 MG tablet, Take 1  tablet (25 mg total) by mouth daily., Disp: 90 tablet, Rfl: 3   metoprolol tartrate (LOPRESSOR) 25 MG tablet, Take 25 mg by mouth 2 (two) times daily. (For fast heart rate >100, a-fib)., Disp: , Rfl:    Multiple Vitamin (MULTIVITAMIN WITH MINERALS) TABS tablet, Take 1 tablet by mouth daily. Women's Complete Multivitamin 50+, Disp: , Rfl:    Probiotic Product (PROBIOTIC ADVANCED PO), Take 1 tablet by mouth every other day., Disp: , Rfl:    Propylene Glycol (SYSTANE COMPLETE OP), Place 1 drop into both eyes 3 (three) times daily as needed (dry/irritated eyes.). , Disp: , Rfl:  rosuvastatin (CRESTOR) 40 MG tablet, Take 1 tablet (40 mg total) by mouth daily., Disp: 90 tablet, Rfl: 3  EXAM:  VITALS per patient if applicable:  GENERAL: alert, oriented, appears well and in no acute distress  HEENT: atraumatic, conjunttiva clear, no obvious abnormalities on inspection of external nose and ears  NECK: normal movements of the head and neck  LUNGS: on inspection no signs of respiratory distress, breathing rate appears normal, no obvious gross SOB, gasping or wheezing  CV: no obvious cyanosis  MS: moves all visible extremities without noticeable abnormality  PSYCH/NEURO: pleasant and cooperative, no obvious depression or anxiety, speech and thought processing grossly intact  ASSESSMENT AND PLAN:  Discussed the following assessment and plan:  Problem List Items Addressed This Visit     Chronic low back pain (Chronic)    Exacerbation after a fall.  We will get an x-ray given her radicular symptoms.       Relevant Orders   DG Lumbar Spine Complete   Hyperlipidemia (Chronic)    Continue Crestor 40 mg daily.  She is not quite due for a lipid panel.       Hypertension - Primary (Chronic)    Adequately controlled.  She will continue losartan 25 mg daily and metoprolol 25 mg twice daily.       Relevant Orders   Basic Metabolic Panel (BMET)   Prediabetes (Chronic)    Check A1c.        Relevant Orders   HgB A1c   Fall    Encouraged continued exercise to try to prevent falls.       Left lower quadrant pain    Intermittent ongoing issue.  Likely related to the need to have a bowel movement.  She will monitor this and if it changes she will let us know.       Orange-colored urine    Check urinalysis.  We will also check liver enzymes.       Relevant Orders   POCT Urinalysis Dipstick   Hepatic function panel    Return in about 1 week (around 06/30/2021) for labs and x-ray, 4 months PCP.   I discussed the assessment and treatment plan with the patient. The patient was provided an opportunity to ask questions and all were answered. The patient agreed with the plan and demonstrated an understanding of the instructions.   The patient was advised to call back or seek an in-person evaluation if the symptoms worsen or if the condition fails to improve as anticipated.  [name], MD

## 2021-06-23 NOTE — Assessment & Plan Note (Signed)
Intermittent ongoing issue.  Likely related to the need to have a bowel movement.  She will monitor this and if it changes she will let us know.

## 2021-07-01 ENCOUNTER — Other Ambulatory Visit (INDEPENDENT_AMBULATORY_CARE_PROVIDER_SITE_OTHER): Payer: Medicare PPO

## 2021-07-01 ENCOUNTER — Other Ambulatory Visit: Payer: Self-pay | Admitting: Family Medicine

## 2021-07-01 ENCOUNTER — Other Ambulatory Visit: Payer: Medicare PPO

## 2021-07-01 ENCOUNTER — Ambulatory Visit (INDEPENDENT_AMBULATORY_CARE_PROVIDER_SITE_OTHER): Payer: Medicare PPO

## 2021-07-01 DIAGNOSIS — G8929 Other chronic pain: Secondary | ICD-10-CM | POA: Diagnosis not present

## 2021-07-01 DIAGNOSIS — R82998 Other abnormal findings in urine: Secondary | ICD-10-CM

## 2021-07-01 DIAGNOSIS — R8290 Unspecified abnormal findings in urine: Secondary | ICD-10-CM | POA: Diagnosis not present

## 2021-07-01 DIAGNOSIS — I10 Essential (primary) hypertension: Secondary | ICD-10-CM

## 2021-07-01 DIAGNOSIS — M4316 Spondylolisthesis, lumbar region: Secondary | ICD-10-CM | POA: Diagnosis not present

## 2021-07-01 DIAGNOSIS — R7303 Prediabetes: Secondary | ICD-10-CM | POA: Diagnosis not present

## 2021-07-01 DIAGNOSIS — M545 Low back pain, unspecified: Secondary | ICD-10-CM | POA: Diagnosis not present

## 2021-07-01 DIAGNOSIS — M4126 Other idiopathic scoliosis, lumbar region: Secondary | ICD-10-CM | POA: Diagnosis not present

## 2021-07-01 DIAGNOSIS — M5441 Lumbago with sciatica, right side: Secondary | ICD-10-CM

## 2021-07-01 LAB — POCT URINALYSIS DIPSTICK
Bilirubin, UA: NEGATIVE
Glucose, UA: NEGATIVE
Ketones, UA: NEGATIVE
Nitrite, UA: NEGATIVE
Protein, UA: NEGATIVE
Spec Grav, UA: 1.01 (ref 1.010–1.025)
Urobilinogen, UA: 0.2 E.U./dL
pH, UA: 6 (ref 5.0–8.0)

## 2021-07-01 LAB — HEPATIC FUNCTION PANEL
ALT: 15 U/L (ref 0–35)
AST: 19 U/L (ref 0–37)
Albumin: 4.3 g/dL (ref 3.5–5.2)
Alkaline Phosphatase: 71 U/L (ref 39–117)
Bilirubin, Direct: 0.1 mg/dL (ref 0.0–0.3)
Total Bilirubin: 0.5 mg/dL (ref 0.2–1.2)
Total Protein: 6.8 g/dL (ref 6.0–8.3)

## 2021-07-01 LAB — BASIC METABOLIC PANEL
BUN: 11 mg/dL (ref 6–23)
CO2: 29 mEq/L (ref 19–32)
Calcium: 9.5 mg/dL (ref 8.4–10.5)
Chloride: 103 mEq/L (ref 96–112)
Creatinine, Ser: 0.66 mg/dL (ref 0.40–1.20)
GFR: 88.61 mL/min (ref >60.00)
Glucose, Bld: 83 mg/dL (ref 70–99)
Potassium: 3.6 mEq/L (ref 3.5–5.1)
Sodium: 139 mEq/L (ref 135–145)

## 2021-07-01 LAB — HEMOGLOBIN A1C: Hgb A1c MFr Bld: 6 % (ref 4.6–6.5)

## 2021-07-02 LAB — URINALYSIS, MICROSCOPIC ONLY

## 2021-08-17 ENCOUNTER — Telehealth: Payer: Self-pay | Admitting: Family Medicine

## 2021-08-17 NOTE — Telephone Encounter (Signed)
Reviewed. Agree with advice given.

## 2021-08-17 NOTE — Telephone Encounter (Signed)
Patient came into office concerned about BP readings( copied and given to provider) and complaint of increased anxiety since brother-in-law passed on 09/02/21. Patient crying several times in office due  to concerns of family members death, patient reported feeling anxious, and anxiety level up , BP taken after trying to get patient to calm down, recorded at 152/88 pulse 81 and 02 on room air 98%. Patient took Losartan 25 mg and first dose of eliquis 5 mg at 12:30 PM today staed she forgot this morning. Patient denied headache, dizziness, or palpitations. Patient stated she believed her anxiety causing increased BP and requested Xanax, advised patient this would require an appointment and scheduled patient after agreement. Gave patient BP recording sheets X 10 and gave written Appt reminder. Spoke with PCP and advised of patient complaints and that appointment scheduled provider agreed with plan. Advised patient to call back if BP increased over 160/90 and she tried resting for 20 minutes and BP does not come down, or if symptoms worsen.

## 2021-08-19 ENCOUNTER — Ambulatory Visit: Payer: Medicare PPO | Admitting: Family Medicine

## 2021-08-19 ENCOUNTER — Encounter: Payer: Self-pay | Admitting: Family Medicine

## 2021-08-19 DIAGNOSIS — I10 Essential (primary) hypertension: Secondary | ICD-10-CM

## 2021-08-19 DIAGNOSIS — H579 Unspecified disorder of eye and adnexa: Secondary | ICD-10-CM

## 2021-08-19 DIAGNOSIS — F32A Depression, unspecified: Secondary | ICD-10-CM | POA: Diagnosis not present

## 2021-08-19 DIAGNOSIS — F419 Anxiety disorder, unspecified: Secondary | ICD-10-CM

## 2021-08-19 MED ORDER — ALPRAZOLAM 0.25 MG PO TABS: 0.2500 mg | ORAL_TABLET | Freq: Two times a day (BID) | ORAL | 0 refills | Status: DC | PRN

## 2021-08-19 NOTE — Progress Notes (Signed)
Donna Rumps, MD Phone: [phone]  Donna Rios is a 71 y.o. female who presents today for same-day visit.  HYPERTENSION Disease Monitoring Home BP Monitoring 118-154/[id number] chest pain- no    Dyspnea- no Medications Compliance-  taking losartan.  BMET    Component Value Date/Time   NA 139 07/01/2021 1339   K 3.6 07/01/2021 1339   CL 103 07/01/2021 1339   CO2 29 07/01/2021 1339   GLUCOSE 83 07/01/2021 1339   BUN 11 07/01/2021 1339   CREATININE 0.66 07/01/2021 1339   CREATININE 0.75 05/06/2016 0959   CALCIUM 9.5 07/01/2021 1339   GFRNONAA >60 07/25/2018 1121   GFRNONAA 84 05/06/2016 0959   GFRAA >60 07/25/2018 1121   GFRAA >89 05/06/2016 0959   Anxiety/depression: Patient does note some anxiety and depression.  Her brother-in-law passed away recently.  She seems to be doing well prior to that occurring.  She is traveling to [place] for the funeral and request something for anxiety.  She has taken Xanax in the past and tolerated it adequately.  She notes she is only going to take it if she really needs it and she will only take it for a very short period of time as she is not going to get hooked on it.  She notes no SI.  Bloodshot eye: Patient reports if she sleeps on her left side she will occasionally get a bloodshot left eye.  It has occurred on 2 occasions.  She had no other abnormalities with the eye.  Social History   Tobacco Use  Smoking Status Every Day   Packs/day: 0.50   Years: 46.00   Total pack years: 23.00   Types: Cigarettes  Smokeless Tobacco Never    Current Outpatient Medications on File Prior to Visit  Medication Sig Dispense Refill   apixaban (ELIQUIS) 5 MG TABS tablet Take 1 tablet (5 mg total) by mouth 2 (two) times daily. 180 tablet 3   APPLE CIDER VINEGAR PO Take 1 tablet by mouth daily as needed (with fatty foods).      b complex vitamins tablet Take 1 tablet by mouth 2 (two) times a week.      Calcium-Magnesium-Vitamin D (CALCIUM 1200+D3  PO) Take 1 tablet by mouth daily.     cetirizine (ZYRTEC) 10 MG tablet Take 10 mg by mouth as needed.      fluticasone (FLONASE) 50 MCG/ACT nasal spray Place 1 spray into both nostrils daily as needed for allergies.      Lidocaine 4 % SOLN Apply topically as needed.     losartan (COZAAR) 25 MG tablet Take 1 tablet (25 mg total) by mouth daily. 90 tablet 3   metoprolol tartrate (LOPRESSOR) 25 MG tablet Take 25 mg by mouth 2 (two) times daily. (For fast heart rate >100, a-fib).     Multiple Vitamin (MULTIVITAMIN WITH MINERALS) TABS tablet Take 1 tablet by mouth daily. Women's Complete Multivitamin 50+     Probiotic Product (PROBIOTIC ADVANCED PO) Take 1 tablet by mouth every other day.     Propylene Glycol (SYSTANE COMPLETE OP) Place 1 drop into both eyes 3 (three) times daily as needed (dry/irritated eyes.).      rosuvastatin (CRESTOR) 40 MG tablet Take 1 tablet (40 mg total) by mouth daily. 90 tablet 3   No current facility-administered medications on file prior to visit.     ROS see history of present illness  Objective  Physical Exam Vitals:   08/19/21 0926  BP: 130/80  Pulse: 83  Temp: 98.1 F (36.7 C)  SpO2: 99%    BP Readings from Last 3 Encounters:  08/19/21 130/80  06/02/21 140/70  02/20/21 140/80   Wt Readings from Last 3 Encounters:  08/19/21 178 lb 3.2 oz (80.8 kg)  06/23/21 185 lb (83.9 kg)  06/11/21 185 lb (83.9 kg)    Physical Exam Constitutional:      General: She is not in acute distress.    Appearance: She is not diaphoretic.  Eyes:     Conjunctiva/sclera: Conjunctivae normal.     Pupils: Pupils are equal, round, and reactive to light.  Cardiovascular:     Rate and Rhythm: Normal rate and regular rhythm.     Heart sounds: Normal heart sounds.  Pulmonary:     Effort: Pulmonary effort is normal.     Breath sounds: Normal breath sounds.  Skin:    General: Skin is warm and dry.  Neurological:     Mental Status: She is alert.       Assessment/Plan: Please see individual problem list.  Problem List Items Addressed This Visit     Hypertension (Chronic)    Has increased recently though seems to be in the setting of anxiety and stress with the loss of her brother-in-law.  Discussed continuing to monitor and if her blood pressure is consistently greater than 150/90 she will let us know.  For now she will continue losartan 25 mg daily.      Anxiety and depression    Mild.  This is in the setting of her brother-in-law passing away.  I did discuss hydroxyzine with the patient though she is hesitant to try that and would prefer Xanax as she has tolerated that well in the past.  She is aware of the risk of addiction and dependence with Xanax.  She understands that this will just be a short-term prescription.  She is advised not to drive while taking this medication.  She was advised to discontinue the Xanax if she gets excessively drowsy.      Relevant Medications   ALPRAZolam (XANAX) 0.25 MG tablet   Bloodshot eye    Her eye appears normal at this time.  If this recurs she will see her eye doctor.        Return in about 1 month (around 09/19/2021) for Blood pressure follow-up.   Donna Rumps, MD [place]

## 2021-08-19 NOTE — Assessment & Plan Note (Signed)
Has increased recently though seems to be in the setting of anxiety and stress with the loss of her brother-in-law.  Discussed continuing to monitor and if her blood pressure is consistently greater than 150/90 she will let us know.  For now she will continue losartan 25 mg daily.

## 2021-08-19 NOTE — Assessment & Plan Note (Signed)
Mild.  This is in the setting of her brother-in-law passing away.  I did discuss hydroxyzine with the patient though she is hesitant to try that and would prefer Xanax as she has tolerated that well in the past.  She is aware of the risk of addiction and dependence with Xanax.  She understands that this will just be a short-term prescription.  She is advised not to drive while taking this medication.  She was advised to discontinue the Xanax if she gets excessively drowsy.

## 2021-08-19 NOTE — Assessment & Plan Note (Signed)
Her eye appears normal at this time.  If this recurs she will see her eye doctor.

## 2021-08-19 NOTE — Patient Instructions (Addendum)
Nice to see you. If the Xanax makes you drowsy please discontinue taking it.   If your bloodshot eye recurs please see your eye doctor.

## 2021-09-06 ENCOUNTER — Other Ambulatory Visit: Payer: Self-pay | Admitting: Family Medicine

## 2021-09-21 ENCOUNTER — Ambulatory Visit: Payer: Medicare PPO | Admitting: Family Medicine

## 2021-09-21 ENCOUNTER — Encounter: Payer: Self-pay | Admitting: Family Medicine

## 2021-09-21 DIAGNOSIS — G8929 Other chronic pain: Secondary | ICD-10-CM

## 2021-09-21 DIAGNOSIS — F419 Anxiety disorder, unspecified: Secondary | ICD-10-CM

## 2021-09-21 DIAGNOSIS — M25552 Pain in left hip: Secondary | ICD-10-CM

## 2021-09-21 DIAGNOSIS — I10 Essential (primary) hypertension: Secondary | ICD-10-CM

## 2021-09-21 DIAGNOSIS — F32A Depression, unspecified: Secondary | ICD-10-CM

## 2021-09-21 NOTE — Patient Instructions (Signed)
Hip Exercises Ask your health care provider which exercises are safe for you. Do exercises exactly as told by your health care provider and adjust them as directed. It is normal to feel mild stretching, pulling, tightness, or discomfort as you do these exercises. Stop right away if you feel sudden pain or your pain gets worse. Do not begin these exercises until told by your health care provider. Stretching and range-of-motion exercises These exercises warm up your muscles and joints and improve the movement and flexibility of your hip. These exercises also help to relieve pain, numbness, and tingling. You may be asked to limit your range of motion if you had a hip replacement. Talk to your health care provider about these restrictions. Hamstrings, supine  Lie on your back (supine position). Loop a belt or towel over the ball of your left / right foot. The ball of your foot is on the walking surface, right under your toes. Straighten your left / right knee and slowly pull on the belt or towel to raise your leg until you feel a gentle stretch behind your knee (hamstring). Do not let your knee bend while you do this. Keep your other leg flat on the floor. Hold this position for __________ seconds. Slowly return your leg to the starting position. Repeat __________ times. Complete this exercise __________ times a day. Hip rotation  Lie on your back on a firm surface. With your left / right hand, gently pull your left / right knee toward the shoulder that is on the same side of the body. Stop when your knee is pointing toward the ceiling. Hold your left / right ankle with your other hand. Keeping your knee steady, gently pull your left / right ankle toward your other shoulder until you feel a stretch in your buttocks. Keep your hips and shoulders firmly planted while you do this stretch. Hold this position for __________ seconds. Repeat __________ times. Complete this exercise __________ times a  day. Seated stretch This exercise is sometimes called hamstrings and adductors stretch. Sit on the floor with your legs stretched wide. Keep your knees straight during this exercise. Keeping your head and back in a straight line, bend at your waist to reach for your left foot (position A). You should feel a stretch in your right inner thigh (adductors). Hold this position for __________ seconds. Then slowly return to the upright position. Keeping your head and back in a straight line, bend at your waist to reach forward (position B). You should feel a stretch behind both of your thighs and knees (hamstrings). Hold this position for __________ seconds. Then slowly return to the upright position. Keeping your head and back in a straight line, bend at your waist to reach for your right foot (position C). You should feel a stretch in your left inner thigh (adductors). Hold this position for __________ seconds. Then slowly return to the upright position. Repeat __________ times. Complete this exercise __________ times a day. Lunge This exercise stretches the muscles of the hip (hip flexors). Place your left / right knee on the floor and bend your other knee so that is directly over your ankle. You should be half-kneeling. Keep good posture with your head over your shoulders. Tighten your buttocks to point your tailbone downward. This will prevent your back from arching too much. You should feel a gentle stretch in the front of your left / right thigh and hip. If you do not feel a stretch, slide your other foot   forward slightly and then slowly lunge forward with your chest up until your knee once again lines up over your ankle. Make sure your tailbone continues to point downward. Hold this position for __________ seconds. Slowly return to the starting position. Repeat __________ times. Complete this exercise __________ times a day. Strengthening exercises These exercises build strength and endurance  in your hip. Endurance is the ability to use your muscles for a long time, even after they get tired. Bridge This exercise strengthens the muscles of your hip (hip extensors). Lie on your back on a firm surface with your knees bent and your feet flat on the floor. Tighten your buttocks muscles and lift your bottom off the floor until the trunk of your body and your hips are level with your thighs. Do not arch your back. You should feel the muscles working in your buttocks and the back of your thighs. If you do not feel these muscles, slide your feet 1-2 inches (2.5-5 cm) farther away from your buttocks. Hold this position for __________ seconds. Slowly lower your hips to the starting position. Let your muscles relax completely between repetitions. Repeat __________ times. Complete this exercise __________ times a day. Straight leg raises, side-lying This exercise strengthens the muscles that move the hip joint away from the center of the body (hip abductors). Lie on your side with your left / right leg in the top position. Lie so your head, shoulder, hip, and knee line up. You may bend your bottom knee slightly to help you balance. Roll your hips slightly forward, so your hips are stacked directly over each other and your left / right knee is facing forward. Leading with your heel, lift your top leg 4-6 inches (10-15 cm). You should feel the muscles in your top hip lifting. Do not let your foot drift forward. Do not let your knee roll toward the ceiling. Hold this position for __________ seconds. Slowly return to the starting position. Let your muscles relax completely between repetitions. Repeat __________ times. Complete this exercise __________ times a day. Straight leg raises, side-lying This exercise strengthens the muscles that move the hip joint toward the center of the body (hip adductors). Lie on your side with your left / right leg in the bottom position. Lie so your head, shoulder,  hip, and knee line up. You may place your upper foot in front to help you balance. Roll your hips slightly forward, so your hips are stacked directly over each other and your left / right knee is facing forward. Tense the muscles in your inner thigh and lift your bottom leg 4-6 inches (10-15 cm). Hold this position for __________ seconds. Slowly return to the starting position. Let your muscles relax completely between repetitions. Repeat __________ times. Complete this exercise __________ times a day. Straight leg raises, supine This exercise strengthens the muscles in the front of your thigh (quadriceps). Lie on your back (supine position) with your left / right leg extended and your other knee bent. Tense the muscles in the front of your left / right thigh. You should see your kneecap slide up or see increased dimpling just above your knee. Keep these muscles tight as you raise your leg 4-6 inches (10-15 cm) off the floor. Do not let your knee bend. Hold this position for __________ seconds. Keep these muscles tense as you lower your leg. Relax the muscles slowly and completely between repetitions. Repeat __________ times. Complete this exercise __________ times a day. Hip abductors, standing This   exercise strengthens the muscles that move the leg and hip joint away from the center of the body (hip abductors). Tie one end of a rubber exercise band or tubing to a secure surface, such as a chair, table, or pole. Loop the other end of the band or tubing around your left / right ankle. Keeping your ankle with the band or tubing directly opposite the secured end, step away until there is tension in the tubing or band. Hold on to a chair, table, or pole as needed for balance. Lift your left / right leg out to your side. While you do this: Keep your back upright. Keep your shoulders over your hips. Keep your toes pointing forward. Make sure to use your hip muscles to slowly lift your leg. Do not  tip your body or forcefully lift your leg. Hold this position for __________ seconds. Slowly return to the starting position. Repeat __________ times. Complete this exercise __________ times a day. Squats This exercise strengthens the muscles in the front of your thigh (quadriceps). Stand in a door frame so your feet and knees are in line with the frame. You may place your hands on the frame for balance. Slowly bend your knees and lower your hips like you are going to sit in a chair. Keep your lower legs in a straight-up-and-down position. Do not let your hips go lower than your knees. Do not bend your knees lower than told by your health care provider. If your hip pain increases, do not bend as low. Hold this position for ___________ seconds. Slowly push with your legs to return to standing. Do not use your hands to pull yourself to standing. Repeat __________ times. Complete this exercise __________ times a day. This information is not intended to replace advice given to you by your health care provider. Make sure you discuss any questions you have with your health care provider. Document Revised: 05/28/2020 Document Reviewed: 05/28/2020 Elsevier Patient Education  2023 [employer]   

## 2021-09-21 NOTE — Assessment & Plan Note (Signed)
Prior x-ray with moderate arthritis.  Discussed repeating the x-ray that we defer this today.  She will complete exercises for her hips at home.  I did discuss potential for doing PT though she defers this at this time.

## 2021-09-21 NOTE — Progress Notes (Signed)
[name], MD Phone: [phone]  Donna Rios is a 71 y.o. female who presents today for f/u.  HYPERTENSION Disease Monitoring Home BP Monitoring generally 110s-130s/70s-80s, occasionally higher Chest pain- no    Dyspnea- no Medications Compliance-  taking losartan BMET    Component Value Date/Time   NA 139 07/01/2021 1339   K 3.6 07/01/2021 1339   CL 103 07/01/2021 1339   CO2 29 07/01/2021 1339   GLUCOSE 83 07/01/2021 1339   BUN 11 07/01/2021 1339   CREATININE 0.66 07/01/2021 1339   CREATININE 0.75 05/06/2016 0959   CALCIUM 9.5 07/01/2021 1339   GFRNONAA >60 07/25/2018 1121   GFRNONAA 84 05/06/2016 0959   GFRAA >60 07/25/2018 1121   GFRAA >89 05/06/2016 0959   Anxiety: Patient notes her anxiety was up when she went on a trip out of state.  She had to take Xanax during this trip has not required any since she returned.  Chronic left hip pain: This is a chronic issue.  Notes it catches when she tries to abduct.  Her range of motion is decreased in her left hip.  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  Social History   Tobacco Use  Smoking Status Every Day   Packs/day: 0.50   Years: 46.00   Total pack years: 23.00   Types: Cigarettes  Smokeless Tobacco Never    Current Outpatient Medications on File Prior to Visit  Medication Sig Dispense Refill   ALPRAZolam (XANAX) 0.25 MG tablet Take 1 tablet (0.25 mg total) by mouth 2 (two) times daily as needed for anxiety. 10 tablet 0   APPLE CIDER VINEGAR PO Take 1 tablet by mouth daily as needed (with fatty foods).      b complex vitamins tablet Take 1 tablet by mouth 2 (two) times a week.      Calcium-Magnesium-Vitamin D (CALCIUM 1200+D3 PO) Take 1 tablet by mouth daily.     cetirizine (ZYRTEC) 10 MG tablet Take 10 mg by mouth as needed.      ELIQUIS 5 MG TABS tablet TAKE 1 TABLET BY MOUTH TWICE A DAY 180 tablet 3   fluticasone (FLONASE) 50 MCG/ACT nasal spray Place 1 spray into both nostrils  daily as needed for allergies.      Lidocaine 4 % SOLN Apply topically as needed.     losartan (COZAAR) 25 MG tablet Take 1 tablet (25 mg total) by mouth daily. 90 tablet 3   metoprolol tartrate (LOPRESSOR) 25 MG tablet Take 25 mg by mouth 2 (two) times daily. (For fast heart rate >100, a-fib).     Multiple Vitamin (MULTIVITAMIN WITH MINERALS) TABS tablet Take 1 tablet by mouth daily. Women's Complete Multivitamin 50+     Probiotic Product (PROBIOTIC ADVANCED PO) Take 1 tablet by mouth every other day.     Propylene Glycol (SYSTANE COMPLETE OP) Place 1 drop into both eyes 3 (three) times daily as needed (dry/irritated eyes.).      rosuvastatin (CRESTOR) 40 MG tablet Take 1 tablet (40 mg total) by mouth daily. 90 tablet 3   No current facility-administered medications on file prior to visit.     ROS see history of present illness  Objective  Physical Exam Vitals:   09/21/21 1601  BP: 130/80  Pulse: 91  Temp: 98.9 F (37.2 C)  SpO2: 97%    BP Readings from Last 3 Encounters:  09/21/21 130/80  08/19/21 130/80  06/02/21 140/70   Wt Readings from Last 3 Encounters:  09/21/21  177 lb 12.8 oz (80.6 kg)  08/19/21 178 lb 3.2 oz (80.8 kg)  06/23/21 185 lb (83.9 kg)    Physical Exam Constitutional:      General: She is not in acute distress.    Appearance: She is not diaphoretic.  Cardiovascular:     Rate and Rhythm: Normal rate and regular rhythm.     Heart sounds: Normal heart sounds.  Pulmonary:     Effort: Pulmonary effort is normal.     Breath sounds: Normal breath sounds.  Musculoskeletal:     Comments: Decreased internal and external range of motion left hip  Skin:    General: Skin is warm and dry.  Neurological:     Mental Status: She is alert.      Assessment/Plan: Please see individual problem list.  Problem List Items Addressed This Visit     Anxiety and depression (Chronic)    Mild anxiety.  She defers any medication for this.  She will monitor.       Chronic left hip pain (Chronic)    Prior x-ray with moderate arthritis.  Discussed repeating the x-ray that we defer this today.  She will complete exercises for her hips at home.  I did discuss potential for doing PT though she defers this at this time.      Hypertension (Chronic)    Generally well controlled for her age.  She will continue losartan 25 mg daily.        Return in about 3 months (around 12/22/2021) for htn.   [name], MD [place]

## 2021-09-21 NOTE — Assessment & Plan Note (Signed)
Mild anxiety.  She defers any medication for this.  She will monitor.

## 2021-09-21 NOTE — Assessment & Plan Note (Signed)
Generally well controlled for her age.  She will continue losartan 25 mg daily.

## 2021-10-06 ENCOUNTER — Ambulatory Visit (INDEPENDENT_AMBULATORY_CARE_PROVIDER_SITE_OTHER): Payer: Medicare PPO

## 2021-10-06 DIAGNOSIS — Z23 Encounter for immunization: Secondary | ICD-10-CM

## 2021-10-06 NOTE — Progress Notes (Signed)
Patient came in today for high dose flu given in left deltoid IM.Pt tolerated well with no signs of distress.

## 2021-10-27 ENCOUNTER — Ambulatory Visit: Payer: Medicare PPO | Admitting: Family Medicine

## 2021-10-27 ENCOUNTER — Ambulatory Visit (INDEPENDENT_AMBULATORY_CARE_PROVIDER_SITE_OTHER): Payer: Medicare PPO

## 2021-10-27 ENCOUNTER — Encounter: Payer: Self-pay | Admitting: Family Medicine

## 2021-10-27 VITALS — BP 150/80 | HR 69 | Temp 98.5°F | Ht 68.0 in | Wt 180.6 lb

## 2021-10-27 DIAGNOSIS — M25552 Pain in left hip: Secondary | ICD-10-CM

## 2021-10-27 DIAGNOSIS — R0789 Other chest pain: Secondary | ICD-10-CM | POA: Diagnosis not present

## 2021-10-27 DIAGNOSIS — I10 Essential (primary) hypertension: Secondary | ICD-10-CM | POA: Diagnosis not present

## 2021-10-27 DIAGNOSIS — R350 Frequency of micturition: Secondary | ICD-10-CM

## 2021-10-27 DIAGNOSIS — E782 Mixed hyperlipidemia: Secondary | ICD-10-CM | POA: Diagnosis not present

## 2021-10-27 DIAGNOSIS — G8929 Other chronic pain: Secondary | ICD-10-CM | POA: Diagnosis not present

## 2021-10-27 LAB — COMPREHENSIVE METABOLIC PANEL
ALT: 13 U/L (ref 0–35)
AST: 16 U/L (ref 0–37)
Albumin: 4.2 g/dL (ref 3.5–5.2)
Alkaline Phosphatase: 81 U/L (ref 39–117)
BUN: 12 mg/dL (ref 6–23)
CO2: 29 mEq/L (ref 19–32)
Calcium: 9.2 mg/dL (ref 8.4–10.5)
Chloride: 105 mEq/L (ref 96–112)
Creatinine, Ser: 0.74 mg/dL (ref 0.40–1.20)
GFR: 81.54 mL/min (ref >60.00)
Glucose, Bld: 88 mg/dL (ref 70–99)
Potassium: 4.3 mEq/L (ref 3.5–5.1)
Sodium: 141 mEq/L (ref 135–145)
Total Bilirubin: 0.5 mg/dL (ref 0.2–1.2)
Total Protein: 6.4 g/dL (ref 6.0–8.3)

## 2021-10-27 LAB — POCT URINALYSIS DIPSTICK
Bilirubin, UA: NEGATIVE
Glucose, UA: NEGATIVE
Ketones, UA: NEGATIVE
Leukocytes, UA: NEGATIVE
Nitrite, UA: NEGATIVE
Protein, UA: NEGATIVE
Spec Grav, UA: <=1.005 — AB (ref 1.010–1.025)
Urobilinogen, UA: 0.2 E.U./dL
pH, UA: 5 (ref 5.0–8.0)

## 2021-10-27 LAB — LIPID PANEL
Cholesterol: 129 mg/dL (ref 0–200)
HDL: 51.9 mg/dL (ref >39.00)
LDL Cholesterol: 40 mg/dL (ref 0–99)
NonHDL: 77.51
Total CHOL/HDL Ratio: 2
Triglycerides: 187 mg/dL — ABNORMAL HIGH (ref 0.0–149.0)
VLDL: 37.4 mg/dL (ref 0.0–40.0)

## 2021-10-27 LAB — URINALYSIS, MICROSCOPIC ONLY
RBC / HPF: NONE SEEN (ref 0–?)
WBC, UA: NONE SEEN (ref 0–?)

## 2021-10-27 NOTE — Assessment & Plan Note (Signed)
Check urinalysis. 

## 2021-10-27 NOTE — Assessment & Plan Note (Signed)
Patient's symptoms are most likely musculoskeletal in nature.  They are atypical and not indicative of a cardiac cause.  If she has persistent chest pain or she develops any other symptoms with this she will seek medical attention.

## 2021-10-27 NOTE — Patient Instructions (Addendum)
Nice to see you. We will get an x-ray and lab work today and contact you with the results. I am good to have you come back in a week for [place] to recheck your blood pressure in both of your arms.  If there is still a big difference between your arms we are going to have you see cardiology for this.

## 2021-10-27 NOTE — Assessment & Plan Note (Signed)
Continue Crestor 40 mg daily.  Check labs. 

## 2021-10-27 NOTE — Assessment & Plan Note (Signed)
Check x-ray today.

## 2021-10-27 NOTE — Progress Notes (Signed)
[name], MD Phone: [phone]  Donna Rios is a 71 y.o. female who presents today for f/u.  HYPERTENSION Disease Monitoring Home BP Monitoring ranging from 118-150s/70s-low 90s chest pain with exertion- no    Dyspnea- no Medications Compliance-  taking losartan.    BMET    Component Value Date/Time   NA 139 07/01/2021 1339   K 3.6 07/01/2021 1339   CL 103 07/01/2021 1339   CO2 29 07/01/2021 1339   GLUCOSE 83 07/01/2021 1339   BUN 11 07/01/2021 1339   CREATININE 0.66 07/01/2021 1339   CREATININE 0.75 05/06/2016 0959   CALCIUM 9.5 07/01/2021 1339   GFRNONAA >60 07/25/2018 1121   GFRNONAA 84 05/06/2016 0959   GFRAA >60 07/25/2018 1121   GFRAA >89 05/06/2016 0959   HYPERLIPIDEMIA Symptoms Chest pain on exertion:  no   Medications: Compliance- taking crestor Right upper quadrant pain- occasionally, though this is chronic for many years and she notes it was evaluated by GI in the past with possible irritation in her duodenum  Muscle aches- no change to chronic myalgias Lipid Panel     Component Value Date/Time   CHOL 138 07/04/2020 1031   TRIG 174.0 (H) 07/04/2020 1031   HDL 52.70 07/04/2020 1031   CHOLHDL 3 07/04/2020 1031   VLDL 34.8 07/04/2020 1031   LDLCALC 51 07/04/2020 1031   LDLDIRECT 71.0 09/18/2019 1010   Atypical chest pain: Patient notes occasionally she will have focal left-sided chest discomfort when she is sitting down that feels like a cramp.  It lasts for seconds and goes away on its own.  No radiation.  No shortness of breath.  No diaphoresis.  No exertional chest pain.  Urine frequency: Patient notes this has been going on for couple of days.  She has urgency.  No dysuria.  Left hip pain: This continues to be an issue.  She notes it took her a week to recover from me examining her last time.  She notes discomfort with flexion of her left hip.  She has been doing some of the exercises which have been somewhat helpful though she is not able to  do all of them given her discomfort.  Social History   Tobacco Use  Smoking Status Every Day   Packs/day: 0.50   Years: 46.00   Total pack years: 23.00   Types: Cigarettes  Smokeless Tobacco Never    Current Outpatient Medications on File Prior to Visit  Medication Sig Dispense Refill   ALPRAZolam (XANAX) 0.25 MG tablet Take 1 tablet (0.25 mg total) by mouth 2 (two) times daily as needed for anxiety. 10 tablet 0   APPLE CIDER VINEGAR PO Take 1 tablet by mouth daily as needed (with fatty foods).      b complex vitamins tablet Take 1 tablet by mouth 2 (two) times a week.      Calcium-Magnesium-Vitamin D (CALCIUM 1200+D3 PO) Take 1 tablet by mouth daily.     cetirizine (ZYRTEC) 10 MG tablet Take 10 mg by mouth as needed.      ELIQUIS 5 MG TABS tablet TAKE 1 TABLET BY MOUTH TWICE A DAY 180 tablet 3   fluticasone (FLONASE) 50 MCG/ACT nasal spray Place 1 spray into both nostrils daily as needed for allergies.      Lidocaine 4 % SOLN Apply topically as needed.     losartan (COZAAR) 25 MG tablet Take 1 tablet (25 mg total) by mouth daily. 90 tablet 3   metoprolol tartrate (LOPRESSOR) 25 MG  tablet Take 25 mg by mouth 2 (two) times daily. (For fast heart rate >100, a-fib).     Multiple Vitamin (MULTIVITAMIN WITH MINERALS) TABS tablet Take 1 tablet by mouth daily. Women's Complete Multivitamin 50+     Probiotic Product (PROBIOTIC ADVANCED PO) Take 1 tablet by mouth every other day.     Propylene Glycol (SYSTANE COMPLETE OP) Place 1 drop into both eyes 3 (three) times daily as needed (dry/irritated eyes.).      rosuvastatin (CRESTOR) 40 MG tablet Take 1 tablet (40 mg total) by mouth daily. 90 tablet 3   No current facility-administered medications on file prior to visit.     ROS see history of present illness  Objective  Physical Exam Vitals:   10/27/21 1001  BP: (!) 150/80  Pulse: 69  Temp: 98.5 F (36.9 C)  SpO2: 98%   Patient brought her blood pressure cuff in and her blood  pressure was 170/91 in her left arm and 150/80 in her right arm, when checked by CMA it was 138/80 in her right arm and 160/78 in her left arm  BP Readings from Last 3 Encounters:  10/27/21 (!) 150/80  09/21/21 130/80  08/19/21 130/80   Wt Readings from Last 3 Encounters:  10/27/21 180 lb 9.6 oz (81.9 kg)  09/21/21 177 lb 12.8 oz (80.6 kg)  08/19/21 178 lb 3.2 oz (80.8 kg)    Physical Exam Constitutional:      General: She is not in acute distress.    Appearance: She is not diaphoretic.  Cardiovascular:     Rate and Rhythm: Normal rate and regular rhythm.     Heart sounds: Normal heart sounds.  Pulmonary:     Effort: Pulmonary effort is normal.     Breath sounds: Normal breath sounds.  Chest:     Chest wall: No tenderness.  Musculoskeletal:     Right lower leg: No edema.     Left lower leg: No edema.  Skin:    General: Skin is warm and dry.  Neurological:     Mental Status: She is alert.      Assessment/Plan: Please see individual problem list.  Problem List Items Addressed This Visit     Chronic left hip pain (Chronic)    Check x-ray today.      Relevant Orders   DG HIP UNILAT WITH PELVIS 2-3 VIEWS LEFT   Hyperlipidemia (Chronic)    Continue Crestor 40 mg daily.  Check labs.      Relevant Orders   Lipid panel   Comp Met (CMET)   Hypertension - Primary (Chronic)    Patient has a difference of blood pressures in her arms that could account for some of her elevated readings.  I will be to have her return in 1 week to have this rechecked bilaterally and if there is still this much of a difference she will need to see cardiology for follow-up.  For now she will continue losartan 25 mg daily.      Musculoskeletal chest pain    Patient's symptoms are most likely musculoskeletal in nature.  They are atypical and not indicative of a cardiac cause.  If she has persistent chest pain or she develops any other symptoms with this she will seek medical attention.       Urine frequency    Check urinalysis.      Relevant Orders   POCT Urinalysis Dipstick (Completed)   Urine Culture   Urine Microscopic  Return in about 1 week (around 11/03/2021) for Nurse BP check of both arms, 3 months PCP.   [name], MD [place]

## 2021-10-27 NOTE — Assessment & Plan Note (Signed)
Patient has a difference of blood pressures in her arms that could account for some of her elevated readings.  I will be to have her return in 1 week to have this rechecked bilaterally and if there is still this much of a difference she will need to see cardiology for follow-up.  For now she will continue losartan 25 mg daily.

## 2021-10-29 LAB — URINE CULTURE
MICRO NUMBER:: 14000821
SPECIMEN QUALITY:: ADEQUATE

## 2021-11-03 ENCOUNTER — Ambulatory Visit (INDEPENDENT_AMBULATORY_CARE_PROVIDER_SITE_OTHER): Payer: Medicare PPO

## 2021-11-03 VITALS — BP 150/80 | HR 83

## 2021-11-03 DIAGNOSIS — I10 Essential (primary) hypertension: Secondary | ICD-10-CM

## 2021-11-03 NOTE — Progress Notes (Cosign Needed)
Patient here for nurse visit BP check per order from [place] 10/27/21.   Patient reports compliance with prescribed BP medications: yes, Losartan 25 mg once daily    Last dose of BP medication: This morning '@9am'$   BP Readings from Last 3 Encounters:  11/03/21 (!) 150/80  10/27/21 (!) 150/80  09/21/21 130/80   Pulse Readings from Last 3 Encounters:  11/03/21 83  10/27/21 69  09/21/21 91  I had pt sit & relax for about 10-15 mins due to first readings being elevated as stated above. Went back & retook BP's in both L & R arms.  2nd readings: L arm:130/80 R arm:140/80  PR:78  PR:78 O2:98  O2:96  Pt also brought in BP readings from the previous wk for your review. Copy placed in review box upon your return.  Notified pt that note w/readings would be sent to PCP for review & CMA would be reaching out to pt in regards to next steps or change in therapy.   Patient verbalized understanding of instructions.   Elita Quick [place],CMA

## 2021-11-06 ENCOUNTER — Other Ambulatory Visit: Payer: Self-pay

## 2021-11-06 ENCOUNTER — Telehealth: Payer: Self-pay

## 2021-11-06 DIAGNOSIS — I10 Essential (primary) hypertension: Secondary | ICD-10-CM

## 2021-11-06 NOTE — Progress Notes (Signed)
BMP ordered for recheck in 10 days, right after BP check on 10/24

## 2021-11-06 NOTE — Progress Notes (Signed)
Lvm for pt to return call in regards to BP check that was done on 11/03/21.

## 2021-11-06 NOTE — Telephone Encounter (Signed)
Lvm for pt to return call in regards to BP check that was done on 11/03/21.  Per Dr.Sonnenberg: BP is above goal. Patient should increase her losartan to 50 mg daily and have a BMET in 10 days with a nurse BP check at that time.

## 2021-11-06 NOTE — Progress Notes (Signed)
Informed pt to increase losartan to 50 mg qd & has been sch for repeat BP check on 11/17/21 in addition to having BMP drawn on the same day, order placed. Instructed pt to reach out to ofc if unable to tolerate higher dose of losartan. Pt verbalized understanding to info.

## 2021-11-06 NOTE — Progress Notes (Signed)
BP is above goal. Patient should increase her losartan to 50 mg daily and have a BMET in 10 days with a nurse BP check at that time.

## 2021-11-11 NOTE — Addendum Note (Signed)
Addended by: [name] on: 11/11/2021 08:48 AM   Modules accepted: Orders

## 2021-11-17 ENCOUNTER — Ambulatory Visit (INDEPENDENT_AMBULATORY_CARE_PROVIDER_SITE_OTHER): Payer: Medicare PPO

## 2021-11-17 ENCOUNTER — Telehealth: Payer: Self-pay

## 2021-11-17 VITALS — BP 150/86 | HR 74

## 2021-11-17 DIAGNOSIS — I10 Essential (primary) hypertension: Secondary | ICD-10-CM | POA: Diagnosis not present

## 2021-11-17 LAB — BASIC METABOLIC PANEL
BUN: 16 mg/dL (ref 6–23)
CO2: 31 mEq/L (ref 19–32)
Calcium: 9.2 mg/dL (ref 8.4–10.5)
Chloride: 103 mEq/L (ref 96–112)
Creatinine, Ser: 0.74 mg/dL (ref 0.40–1.20)
GFR: 81.51 mL/min (ref >60.00)
Glucose, Bld: 101 mg/dL — ABNORMAL HIGH (ref 70–99)
Potassium: 4 mEq/L (ref 3.5–5.1)
Sodium: 138 mEq/L (ref 135–145)

## 2021-11-17 NOTE — Telephone Encounter (Signed)
Lvm for pt to return call in regards to BP check done today 11/17/21  Per Dr.Sonnenberg: BP is acceptable for age on recheck. Home BPs are generally acceptable. She should continue with losartan 50 mg daily.

## 2021-11-17 NOTE — Progress Notes (Signed)
Patient here for nurse visit BP check per order from [place] note 11/03/21.   Patient reports compliance with prescribed BP medications: yes, Losartan 50 mg daily   Last dose of BP medication: This morning '@8'$ :45 am  BP Readings from Last 3 Encounters:  11/17/21 (!) 150/86  11/03/21 (!) 150/80  10/27/21 (!) 150/80   Pulse Readings from Last 3 Encounters:  11/17/21 74  11/03/21 83  10/27/21 69  I had pt sit & relax for about 10-15 mins due to first readings being elevated as stated above. Went back & retook BP's in both L & R arms.  2nd readings: L arm:130/80  R arm:130/82 PR:71            PR:71 O2:99              O2:98  Pt also brought in BP readings for your review. Copy placed in review box. Notified pt that note w/readings would be sent to PCP for review & CMA would be reaching out to pt in regards to next steps or change in therapy.    Patient verbalized understanding of instructions.    Elita Quick Dimas,CMA

## 2021-11-17 NOTE — Progress Notes (Signed)
Lvm for pt to return call in regards to BP check done today 11/17/21

## 2021-11-17 NOTE — Progress Notes (Signed)
Informed pt that BP's were acceptable at recheck today & home BP's as well. Instructed pt to cont losartan 50 mg q day. Pt verbalized understanding to info. Pt states she will need new rx on losartan since she is starting to run out since she had to double on dose.

## 2021-12-22 ENCOUNTER — Ambulatory Visit: Payer: Medicare PPO | Admitting: Family Medicine

## 2022-01-13 ENCOUNTER — Telehealth: Payer: Self-pay | Admitting: Family Medicine

## 2022-01-13 NOTE — Telephone Encounter (Signed)
Pt need a refill on losartan sent to [facility] [address]

## 2022-01-14 ENCOUNTER — Other Ambulatory Visit: Payer: Self-pay

## 2022-01-14 DIAGNOSIS — I10 Essential (primary) hypertension: Secondary | ICD-10-CM

## 2022-01-14 MED ORDER — LOSARTAN POTASSIUM 25 MG PO TABS: 25.0000 mg | ORAL_TABLET | Freq: Every day | ORAL | 3 refills | Status: DC

## 2022-01-14 NOTE — Telephone Encounter (Signed)
Sent to pharmacy.  Vallory Oetken,cma

## 2022-01-16 ENCOUNTER — Other Ambulatory Visit: Payer: Self-pay | Admitting: Family Medicine

## 2022-01-16 ENCOUNTER — Ambulatory Visit
Admission: EM | Admit: 2022-01-16 | Discharge: 2022-01-16 | Disposition: A | Discharge status: Home / Self Care | Payer: Medicare PPO | Attending: Urgent Care | Admitting: Urgent Care

## 2022-01-16 DIAGNOSIS — J019 Acute sinusitis, unspecified: Secondary | ICD-10-CM | POA: Diagnosis not present

## 2022-01-16 DIAGNOSIS — I10 Essential (primary) hypertension: Secondary | ICD-10-CM

## 2022-01-16 DIAGNOSIS — B9689 Other specified bacterial agents as the cause of diseases classified elsewhere: Secondary | ICD-10-CM | POA: Diagnosis not present

## 2022-01-16 MED ORDER — AMOXICILLIN-POT CLAVULANATE 875-125 MG PO TABS: 1.0000 | ORAL_TABLET | Freq: Two times a day (BID) | ORAL | 0 refills | Status: DC

## 2022-01-16 MED ORDER — LOSARTAN POTASSIUM 50 MG PO TABS: 50.0000 mg | ORAL_TABLET | Freq: Every day | ORAL | 0 refills | Status: DC

## 2022-01-16 NOTE — ED Triage Notes (Signed)
Pt. Presents to UC w/ c/o a headache, sinus pressure, nasal drainage and a cough for the past 2 days. Pt. Has been taking OTC medication for symptom control.

## 2022-01-16 NOTE — Discharge Instructions (Signed)
Follow up with your primary care provider if your symptoms are worsening or not improving.    

## 2022-01-16 NOTE — ED Provider Notes (Signed)
Roderic Palau    CSN: [id number] Arrival date & time: 01/16/22  1343      History   Chief Complaint Chief Complaint  Patient presents with   Headache   Facial Pain   nasal drainage    Cough    HPI Donna Rios is a 71 y.o. female.   HPI  Presents to urgent care with complaint of headache, sinus pressure, nasal drainage, cough x 8 days.  She states she has been taking OTC medication for symptom control.  She states her symptoms started getting worse about 3 days ago when she started to get copious green mucus from her nose.  She also requests prescription refill for losartan she states that her primary care provider asked her to start taking to losartan tablets daily and he only prescribed 1.  She has run out of medication.  Past Medical History:  Diagnosis Date   Allergic rhinitis    Arthritis    Fatty liver    Hypertension    Phlebitis    S/P TAVR (transcatheter aortic valve replacement)    Severe aortic stenosis    Stress incontinence    Tobacco abuse     Patient Active Problem List   Diagnosis Date Noted   Bloodshot eye 08/19/2021   Fall 06/23/2021   Left lower quadrant pain 06/23/2021   Orange-colored urine 06/23/2021   Cubital tunnel syndrome 02/20/2021   Cat scratch 02/20/2021   Carpal tunnel syndrome, bilateral 09/30/2020   Paroxysmal atrial fibrillation ([place]) 07/04/2020   Constipation 07/04/2020   Aortic atherosclerosis ([place]) 03/21/2020   Chronic low back pain 12/19/2019   Postmenopausal estrogen deficiency 09/18/2019   Nicotine dependence, cigarettes, uncomplicated 57/84/6962   Chronic pain of both knees 06/11/2019   Musculoskeletal chest pain 03/27/2019   Hypertension 11/28/2018   S/P TAVR (transcatheter aortic valve replacement)    GERD (gastroesophageal reflux disease) 06/13/2018   Foot pain, bilateral 02/10/2018   Severe aortic valve stenosis 08/16/2017   Chronic left hip pain 08/10/2017   Rotator cuff impingement syndrome of  left shoulder 08/10/2017   Anxiety and depression 05/09/2017   Hyperlipidemia 05/07/2016   Prediabetes 11/06/2015   Urine frequency 08/15/2015   Venous insufficiency 06/11/2015   Stress incontinence 06/11/2015   Squamous cell carcinoma of skin 06/11/2015   Chronic headaches 06/11/2015    Past Surgical History:  Procedure Laterality Date   ABDOMINAL HYSTERECTOMY     BREAST BIOPSY Left    ducts removed   Cataract surgery     COLONOSCOPY WITH PROPOFOL N/A 09/29/2017   Procedure: COLONOSCOPY WITH PROPOFOL;  Surgeon: Lin Landsman, MD;  Location: [facility] ENDOSCOPY;  Service: Gastroenterology;  Laterality: N/A;   EYE SURGERY     FOOT SURGERY     7   INTRAOPERATIVE TRANSTHORACIC ECHOCARDIOGRAM N/A 07/11/2018   Procedure: Intraoperative Transthoracic Echocardiogram;  Surgeon: Sherren Mocha, MD;  Location: [facility];  Service: Open Heart Surgery;  Laterality: N/A;   KNEE ARTHROSCOPY Right    LAPAROSCOPIC HYSTERECTOMY     RIGHT HEART CATH AND CORONARY ANGIOGRAPHY N/A 06/15/2018   Procedure: RIGHT HEART CATH AND CORONARY ANGIOGRAPHY;  Surgeon: Sherren Mocha, MD;  Location: [place] [facility];  Service: Cardiovascular;  Laterality: N/A;   TRANSCATHETER AORTIC VALVE REPLACEMENT, TRANSFEMORAL  07/11/2018   TRANSCATHETER AORTIC VALVE REPLACEMENT, TRANSFEMORAL N/A 07/11/2018   Procedure: TRANSCATHETER AORTIC VALVE REPLACEMENT, TRANSFEMORAL;  Surgeon: Sherren Mocha, MD;  Location: [place];  Service: Open Heart Surgery;  Laterality: N/A;   VARICOSE VEIN SURGERY  OB History   No obstetric history on file.      Home Medications    Prior to Admission medications   Medication Sig Start Date End Date Taking? Authorizing Provider  ALPRAZolam (XANAX) 0.25 MG tablet Take 1 tablet (0.25 mg total) by mouth 2 (two) times daily as needed for anxiety. 08/19/21   [name], MD  APPLE CIDER VINEGAR PO Take 1 tablet by mouth daily as needed (with fatty foods).     [provider]  b  complex vitamins tablet Take 1 tablet by mouth 2 (two) times a week.     [provider]  Calcium-Magnesium-Vitamin D (CALCIUM 1200+D3 PO) Take 1 tablet by mouth daily.    [provider]  cetirizine (ZYRTEC) 10 MG tablet Take 10 mg by mouth as needed.     [provider]  ELIQUIS 5 MG TABS tablet TAKE 1 TABLET BY MOUTH TWICE A DAY 09/06/21   Dutch Quint B, FNP  fluticasone [facility]) 50 MCG/ACT nasal spray Place 1 spray into both nostrils daily as needed for allergies.     [provider]  Lidocaine 4 % SOLN Apply topically as needed.    [provider]  losartan (COZAAR) 25 MG tablet Take 1 tablet (25 mg total) by mouth daily. 01/14/22   [name], MD  metoprolol tartrate (LOPRESSOR) 25 MG tablet Take 25 mg by mouth 2 (two) times daily. (For fast heart rate >100, a-fib).    [provider]  Multiple Vitamin (MULTIVITAMIN WITH MINERALS) TABS tablet Take 1 tablet by mouth daily. Women's Complete Multivitamin 50+    [provider]  Probiotic Product (PROBIOTIC ADVANCED PO) Take 1 tablet by mouth every other day.    [provider]  Propylene Glycol (SYSTANE COMPLETE OP) Place 1 drop into both eyes 3 (three) times daily as needed (dry/irritated eyes.).     [provider]  rosuvastatin (CRESTOR) 40 MG tablet Take 1 tablet (40 mg total) by mouth daily. 02/20/21   [name], MD    Family History Family History  Problem Relation Age of Onset   Alcoholism Other    Arthritis Other    Lung cancer Other    Heart disease Other    Stroke Other    Hypertension Other    Diabetes Other    Heart failure Mother    Hypertension Mother    Diabetes Mother    Heart failure Father    Hypotension Father    Breast cancer Neg Hx     Social History Social History   Tobacco Use   Smoking status: Every Day    Packs/day: 0.50    Years: 46.00    Total pack years: 23.00    Types: Cigarettes   Smokeless  tobacco: Never  Vaping Use   Vaping Use: Never used  Substance Use Topics   Alcohol use: Not Currently    Alcohol/week: 0.0 standard drinks of alcohol   Drug use: No     Allergies   Ibuprofen, Asa [aspirin], Iohexol, and Prednisone   Review of Systems Review of Systems   Physical Exam Triage Vital Signs ED Triage Vitals  Enc Vitals Group     BP 01/16/22 1510 (!) 144/89     Pulse Rate 01/16/22 1510 80     Resp 01/16/22 1510 16     Temp 01/16/22 1510 97.9 F (36.6 C)     Temp src --      SpO2 01/16/22 1510  95 %     Weight --      Height --      Head Circumference --      Peak Flow --      Pain Score 01/16/22 1511 0     Pain Loc --      Pain Edu? --      Excl. in [place]? --    No data found.  Updated Vital Signs BP (!) 144/89   Pulse 80   Temp 97.9 F (36.6 C)   Resp 16   SpO2 95%   Visual Acuity Right Eye Distance:   Left Eye Distance:   Bilateral Distance:    Right Eye Near:   Left Eye Near:    Bilateral Near:     Physical Exam   UC Treatments / Results  Labs (all labs ordered are listed, but only abnormal results are displayed) Labs Reviewed - No data to display  EKG   Radiology No results found.  Procedures Procedures (including critical care time)  Medications Ordered in UC Medications - No data to display  Initial Impression / Assessment and Plan / UC Course  I have reviewed the triage vital signs and the nursing notes.  Pertinent labs & imaging results that were available during my care of the patient were reviewed by me and considered in my medical decision making (see chart for details).   Telephone encounter in the patient's chart "per Dr. [name]: BP is acceptable for age on recheck. She should continue with losartan 50 mg daily."  Will provide patient with prescription for losartan 50 mg.  Patient is afebrile here without recent antipyretics. Satting well on room air. Overall is well appearing, well hydrated, without  respiratory distress. Pulmonary exam is unremarkable.  Lungs CTAB without wheezing, rhonchi, rales.  Suspect bacterial sinusitis based on her history of symptoms.  Will treat with Augmentin x 7 days.  Final Clinical Impressions(s) / UC Diagnoses   Final diagnoses:  None   Discharge Instructions   None    ED Prescriptions   None    PDMP not reviewed this encounter.   Rose Phi, [place] 01/16/22 1523

## 2022-01-29 ENCOUNTER — Encounter: Payer: Self-pay | Admitting: Family Medicine

## 2022-01-29 ENCOUNTER — Ambulatory Visit: Payer: Medicare PPO | Admitting: Family Medicine

## 2022-01-29 VITALS — BP 120/70 | HR 82 | Temp 98.2°F | Ht 68.0 in | Wt 179.0 lb

## 2022-01-29 DIAGNOSIS — Z952 Presence of prosthetic heart valve: Secondary | ICD-10-CM | POA: Diagnosis not present

## 2022-01-29 DIAGNOSIS — F32A Depression, unspecified: Secondary | ICD-10-CM | POA: Diagnosis not present

## 2022-01-29 DIAGNOSIS — I480 Paroxysmal atrial fibrillation: Secondary | ICD-10-CM

## 2022-01-29 DIAGNOSIS — I10 Essential (primary) hypertension: Secondary | ICD-10-CM | POA: Diagnosis not present

## 2022-01-29 DIAGNOSIS — M25552 Pain in left hip: Secondary | ICD-10-CM | POA: Diagnosis not present

## 2022-01-29 DIAGNOSIS — F419 Anxiety disorder, unspecified: Secondary | ICD-10-CM

## 2022-01-29 DIAGNOSIS — L989 Disorder of the skin and subcutaneous tissue, unspecified: Secondary | ICD-10-CM

## 2022-01-29 DIAGNOSIS — G8929 Other chronic pain: Secondary | ICD-10-CM

## 2022-01-29 MED ORDER — LOSARTAN POTASSIUM 50 MG PO TABS: 50.0000 mg | ORAL_TABLET | Freq: Every day | ORAL | 0 refills | Status: DC

## 2022-01-29 MED ORDER — ALPRAZOLAM 0.25 MG PO TABS: 0.2500 mg | ORAL_TABLET | Freq: Two times a day (BID) | ORAL | 0 refills | Status: DC | PRN

## 2022-01-29 MED ORDER — AMOXICILLIN 500 MG PO CAPS: 2000.0000 mg | ORAL_CAPSULE | Freq: Once | ORAL | 0 refills | Status: AC

## 2022-01-29 NOTE — Assessment & Plan Note (Signed)
Refer to dermatology 

## 2022-01-29 NOTE — Assessment & Plan Note (Signed)
Prophylactic amoxicillin refilled for the patient.

## 2022-01-29 NOTE — Assessment & Plan Note (Signed)
Chronic issue.  Adequately controlled.  She will continue losartan 50 mg daily.

## 2022-01-29 NOTE — Progress Notes (Signed)
[name], MD Phone: [phone]  Donna Rios is a 72 y.o. female who presents today for f/u.  HYPERTENSION Disease Monitoring Home BP Monitoring typically less than 140/90 at home Chest pain- no    Dyspnea- no Medications Compliance-  taking losartan.   Edema- no BMET    Component Value Date/Time   NA 138 11/17/2021 0914   K 4.0 11/17/2021 0914   CL 103 11/17/2021 0914   CO2 31 11/17/2021 0914   GLUCOSE 101 (H) 11/17/2021 0914   BUN 16 11/17/2021 0914   CREATININE 0.74 11/17/2021 0914   CREATININE 0.75 05/06/2016 0959   CALCIUM 9.2 11/17/2021 0914   GFRNONAA >60 07/25/2018 1121   GFRNONAA 84 05/06/2016 0959   GFRAA >60 07/25/2018 1121   GFRAA >89 05/06/2016 0959   Atrial fibrillation: Currently on Eliquis and metoprolol.  No palpitations or bleeding issues.  Sinus infection: Patient notes she has recovered from this.  Skin lesions: Patient notes developing skin lesions on her bilateral legs.  She notes they keep coming back.  She describes them as dry patches.  She scratches them though they do not itch.  Left hip pain: Patient notes this is somewhat better with her exercises.  She declines seeing orthopedics for further evaluation of this.  Patient needs a refill on her amoxicillin for her dental procedures given that she had a TAVR.  Social History   Tobacco Use  Smoking Status Every Day   Packs/day: 0.50   Years: 46.00   Total pack years: 23.00   Types: Cigarettes  Smokeless Tobacco Never    Current Outpatient Medications on File Prior to Visit  Medication Sig Dispense Refill   amoxicillin-clavulanate (AUGMENTIN) 875-125 MG tablet Take 1 tablet by mouth every 12 (twelve) hours. 14 tablet 0   APPLE CIDER VINEGAR PO Take 1 tablet by mouth daily as needed (with fatty foods).      b complex vitamins tablet Take 1 tablet by mouth 2 (two) times a week.      Calcium-Magnesium-Vitamin D (CALCIUM 1200+D3 PO) Take 1 tablet by mouth daily.     cetirizine  (ZYRTEC) 10 MG tablet Take 10 mg by mouth as needed.      ELIQUIS 5 MG TABS tablet TAKE 1 TABLET BY MOUTH TWICE A DAY 180 tablet 3   fluticasone (FLONASE) 50 MCG/ACT nasal spray Place 1 spray into both nostrils daily as needed for allergies.      Lidocaine 4 % SOLN Apply topically as needed.     metoprolol tartrate (LOPRESSOR) 25 MG tablet Take 25 mg by mouth 2 (two) times daily. (For fast heart rate >100, a-fib).     Multiple Vitamin (MULTIVITAMIN WITH MINERALS) TABS tablet Take 1 tablet by mouth daily. Women's Complete Multivitamin 50+     Probiotic Product (PROBIOTIC ADVANCED PO) Take 1 tablet by mouth every other day.     Propylene Glycol (SYSTANE COMPLETE OP) Place 1 drop into both eyes 3 (three) times daily as needed (dry/irritated eyes.).      rosuvastatin (CRESTOR) 40 MG tablet Take 1 tablet (40 mg total) by mouth daily. 90 tablet 3   No current facility-administered medications on file prior to visit.     ROS see history of present illness  Objective  Physical Exam Vitals:   01/29/22 0957  BP: 120/70  Pulse: 82  Temp: 98.2 F (36.8 C)  SpO2: 98%    BP Readings from Last 3 Encounters:  01/29/22 120/70  01/16/22 (!) 144/89  11/17/21 [facility])  150/86   Wt Readings from Last 3 Encounters:  01/29/22 179 lb (81.2 kg)  10/27/21 180 lb 9.6 oz (81.9 kg)  09/21/21 177 lb 12.8 oz (80.6 kg)    Physical Exam Constitutional:      General: She is not in acute distress.    Appearance: She is not diaphoretic.  Cardiovascular:     Rate and Rhythm: Normal rate and regular rhythm.     Heart sounds: Normal heart sounds.  Pulmonary:     Effort: Pulmonary effort is normal.     Breath sounds: Normal breath sounds.  Skin:    General: Skin is warm and dry.  Neurological:     Mental Status: She is alert.      Assessment/Plan: Please see individual problem list.  Primary hypertension Assessment & Plan: Chronic issue.  Adequately controlled.  She will continue losartan 50 mg  daily.   Anxiety and depression -     ALPRAZolam; Take 1 tablet (0.25 mg total) by mouth 2 (two) times daily as needed for anxiety.  Dispense: 10 tablet; Refill: 0  Essential hypertension -     Losartan Potassium; Take 1 tablet (50 mg total) by mouth daily.  Dispense: 30 tablet; Refill: 0  Skin lesion Assessment & Plan: Refer to dermatology.  Orders: -     Ambulatory referral to Dermatology  Paroxysmal atrial fibrillation [facility]) Assessment & Plan: Chronic issue.  Sinus rhythm today.  She will continue metoprolol 25 mg twice daily as needed heart rate and Eliquis 5 mg twice daily.   Chronic left hip pain Assessment & Plan: Continue with home exercises.  She will let me know if she would like to see orthopedics in the future.   S/P TAVR (transcatheter aortic valve replacement) Assessment & Plan: Prophylactic amoxicillin refilled for the patient.  Orders: -     Amoxicillin; Take 4 capsules (2,000 mg total) by mouth once for 1 dose. Take 60 minutes prior to dental cleaning or procedure  Dispense: 8 capsule; Refill: 0    Return in about 3 months (around 04/30/2022).   [name], MD [place]

## 2022-01-29 NOTE — Assessment & Plan Note (Signed)
Chronic issue.  Sinus rhythm today.  She will continue metoprolol 25 mg twice daily as needed heart rate and Eliquis 5 mg twice daily.

## 2022-01-29 NOTE — Assessment & Plan Note (Signed)
Continue with home exercises.  She will let me know if she would like to see orthopedics in the future.

## 2022-02-04 ENCOUNTER — Telehealth: Payer: Self-pay | Admitting: Family Medicine

## 2022-02-04 NOTE — Telephone Encounter (Signed)
Prescription Request  02/04/2022  Is this a "Controlled Substance" medicine? No  LOV: 01/29/2022  What is the name of the medication or equipment? losartan (COZAAR) 50 MG tablet  Have you contacted your pharmacy to request a refill? No   Which pharmacy would you like this sent to?   [id number] [place] [place] [id number]: [phone]: [phone]   Patient notified that their request is being sent to the clinical staff for review and that they should receive a response within 2 business days.   Please advise at [phone]     As per pt, she needs a 90-day scrip.

## 2022-02-05 ENCOUNTER — Other Ambulatory Visit: Payer: Self-pay

## 2022-02-05 DIAGNOSIS — I10 Essential (primary) hypertension: Secondary | ICD-10-CM

## 2022-02-05 MED ORDER — LOSARTAN POTASSIUM 50 MG PO TABS: 50.0000 mg | ORAL_TABLET | Freq: Every day | ORAL | 1 refills | Status: DC

## 2022-02-05 NOTE — Telephone Encounter (Signed)
Medication sent to pharmacy for a 90 day supply.  ng

## 2022-03-11 NOTE — Telephone Encounter (Signed)
error 

## 2022-03-25 ENCOUNTER — Other Ambulatory Visit: Payer: Self-pay | Admitting: Family Medicine

## 2022-05-04 ENCOUNTER — Encounter: Payer: Self-pay | Admitting: Family Medicine

## 2022-05-04 ENCOUNTER — Ambulatory Visit: Payer: Medicare PPO | Admitting: Family Medicine

## 2022-05-04 VITALS — BP 124/84 | HR 83 | Temp 98.2°F | Ht 68.0 in | Wt 183.8 lb

## 2022-05-04 DIAGNOSIS — I700 Atherosclerosis of aorta: Secondary | ICD-10-CM

## 2022-05-04 DIAGNOSIS — I10 Essential (primary) hypertension: Secondary | ICD-10-CM | POA: Diagnosis not present

## 2022-05-04 DIAGNOSIS — R7303 Prediabetes: Secondary | ICD-10-CM

## 2022-05-04 DIAGNOSIS — G8929 Other chronic pain: Secondary | ICD-10-CM | POA: Diagnosis not present

## 2022-05-04 DIAGNOSIS — M5441 Lumbago with sciatica, right side: Secondary | ICD-10-CM | POA: Diagnosis not present

## 2022-05-04 DIAGNOSIS — I480 Paroxysmal atrial fibrillation: Secondary | ICD-10-CM

## 2022-05-04 DIAGNOSIS — Z1231 Encounter for screening mammogram for malignant neoplasm of breast: Secondary | ICD-10-CM | POA: Diagnosis not present

## 2022-05-04 LAB — BASIC METABOLIC PANEL
BUN: 15 mg/dL (ref 6–23)
CO2: 29 mEq/L (ref 19–32)
Calcium: 9.5 mg/dL (ref 8.4–10.5)
Chloride: 103 mEq/L (ref 96–112)
Creatinine, Ser: 0.89 mg/dL (ref 0.40–1.20)
GFR: 65.1 mL/min (ref >60.00)
Glucose, Bld: 92 mg/dL (ref 70–99)
Potassium: 4.2 mEq/L (ref 3.5–5.1)
Sodium: 139 mEq/L (ref 135–145)

## 2022-05-04 LAB — HEMOGLOBIN A1C: Hgb A1c MFr Bld: 5.9 % (ref 4.6–6.5)

## 2022-05-04 MED ORDER — PREDNISONE 20 MG PO TABS: 40.0000 mg | ORAL_TABLET | Freq: Every day | ORAL | 0 refills | Status: DC

## 2022-05-04 MED ORDER — LOSARTAN POTASSIUM 50 MG PO TABS: 50.0000 mg | ORAL_TABLET | Freq: Every day | ORAL | 3 refills | Status: DC
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
## 2022-05-04 NOTE — Patient Instructions (Signed)
Nice to see you. You are due for mammogram.  Please call [phone] to schedule this. Orthopedic should contact you in the next week or 2.  If you do not hear from them please let us know.

## 2022-05-04 NOTE — Assessment & Plan Note (Signed)
Chronic issue with recent exacerbation.  Will refer back to orthopedics in [place].  I will place her on prednisone 40 mg once daily for 5 days to see if this will help with her symptoms.  Discussed risk of increased appetite, sleep issues, and agitation with the prednisone.

## 2022-05-04 NOTE — Assessment & Plan Note (Signed)
Continue risk factor management. 

## 2022-05-04 NOTE — Assessment & Plan Note (Addendum)
Chronic issue.  Sinus rhythm today.  Continue Eliquis 5 mg twice daily and metoprolol 25 mg twice daily as needed for elevated heart rate.
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
## 2022-05-04 NOTE — Assessment & Plan Note (Addendum)
Chronic issue.  Generally well-controlled.  She will continue losartan 50 mg daily.  Check labs.  The aching in the patient's chest is unlikely to be cardiac in nature given that it occurs at rest and improves with movement.  Suspect musculoskeletal cause.  She will monitor.

## 2022-05-05 ENCOUNTER — Ambulatory Visit: Payer: Medicare PPO | Admitting: Physical Medicine and Rehabilitation

## 2022-05-05 ENCOUNTER — Encounter: Payer: Self-pay | Admitting: Physical Medicine and Rehabilitation

## 2022-05-05 DIAGNOSIS — M48062 Spinal stenosis, lumbar region with neurogenic claudication: Secondary | ICD-10-CM

## 2022-05-05 DIAGNOSIS — R1032 Left lower quadrant pain: Secondary | ICD-10-CM

## 2022-05-05 DIAGNOSIS — M25552 Pain in left hip: Secondary | ICD-10-CM | POA: Diagnosis not present

## 2022-05-05 DIAGNOSIS — G8929 Other chronic pain: Secondary | ICD-10-CM | POA: Diagnosis not present

## 2022-05-05 DIAGNOSIS — R269 Unspecified abnormalities of gait and mobility: Secondary | ICD-10-CM | POA: Diagnosis not present

## 2022-05-05 DIAGNOSIS — M5416 Radiculopathy, lumbar region: Secondary | ICD-10-CM | POA: Diagnosis not present

## 2022-05-05 NOTE — Progress Notes (Signed)
Functional Pain Scale - descriptive words and definitions  Distracting (5)    Aware of pain/able to complete some ADL's but limited by pain/sleep is affected and active distractions are only slightly useful. Moderate range order  Average Pain  varies  Lower back pain on both sides, but worse on the left and radiates into left hip and down leg to the knee. Patient fell at the end of 2023 and bruised lower back. Uses Tylenol Arthritis and Lidocaine
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
## 2022-05-13 ENCOUNTER — Other Ambulatory Visit: Payer: Self-pay

## 2022-05-13 ENCOUNTER — Ambulatory Visit: Payer: Medicare PPO | Admitting: Physical Medicine and Rehabilitation

## 2022-05-13 DIAGNOSIS — M25552 Pain in left hip: Secondary | ICD-10-CM

## 2022-05-13 NOTE — Progress Notes (Signed)
Donna Rios - 72 y.o. female MRN [id number]  Date of birth: May 25, 1950  Office Visit Note: Visit Date: 05/13/2022 PCP: Glori Luis, MD Referred by: Glori Luis, MD  Subjective: No chief complaint on file.  HPI:  Donna Rios is a 72 y.o. female who comes in today at the request of Ellin Goodie, FNP for planned Left anesthetic hip arthrogram with fluoroscopic guidance.  The patient has failed conservative care including home exercise, medications, time and activity modification.  This injection will be diagnostic and hopefully therapeutic.  Please see requesting physician notes for further details and justification.   ROS Otherwise per HPI.  Assessment & Plan: Visit Diagnoses:    ICD-10-CM   1. Pain in left hip  M25.552 XR C-ARM NO REPORT      Plan: No additional findings.   Meds & Orders: No orders of the defined types were placed in this encounter.   Orders Placed This Encounter  Procedures   Large Joint Inj   XR C-ARM NO REPORT    Follow-up: Return for visit to requesting provider as needed.   Procedures: Large Joint Inj: L hip joint on 05/13/2022 8:12 AM Indications: diagnostic evaluation and pain Details: 22 G 3.5 in needle, fluoroscopy-guided anterior approach  Arthrogram: No  Medications: 4 mL bupivacaine 0.25 %; 40 mg triamcinolone acetonide 40 MG/ML Outcome: tolerated well, no immediate complications  There was excellent flow of contrast producing a partial arthrogram of the hip. The patient did have relief of symptoms during the anesthetic phase of the injection. Procedure, treatment alternatives, risks and benefits explained, specific risks discussed. Consent was given by the patient. Immediately prior to procedure a time out was called to verify the correct patient, procedure, equipment, support staff and site/side marked as required. Patient was prepped and draped in the usual sterile fashion.          Clinical History: MRI LUMBAR  SPINE WITHOUT CONTRAST   TECHNIQUE: Multiplanar, multisequence MR imaging of the lumbar spine was performed. No intravenous contrast was administered.   COMPARISON:  08/02/2019 lumbar spine radiographs   FINDINGS: Segmentation:  Standard.   Alignment: Mild lumbar levoscoliosis. 3 mm anterolisthesis of L4 on L5.   Vertebrae: No fracture or suspicious osseous lesion. Mild degenerative endplate edema at W09-W1.   Conus medullaris and cauda equina: Conus extends to the L1-2 level. Conus and cauda equina appear normal.   Paraspinal and other soft tissues: Unremarkable.   Disc levels:   Disc desiccation throughout the lumbar and lower thoracic spine. Mild disc space narrowing at T12-L1, L4-5, and L5-S1.   T11-12: Only imaged sagittally. Mild disc bulging and moderate facet arthrosis without evidence of significant stenosis.   T12-L1: Disc bulging mildly eccentric to the left and mild right and moderate left facet arthrosis without stenosis.   L1-2: Minimal disc bulging and mild facet and ligamentum flavum hypertrophy without stenosis.   L2-3: Mild disc bulging and mild facet and ligamentum flavum hypertrophy without stenosis.   L3-4: Mild disc bulging and moderate facet and ligamentum flavum hypertrophy without stenosis.   L4-5: Anterolisthesis with slight bulging of uncovered disc and severe facet and ligamentum flavum hypertrophy result in moderate to severe spinal stenosis without neural foraminal stenosis.   L5-S1: Disc bulging, endplate spurring, disc space height loss, and moderate left greater than right facet hypertrophy result in mild bilateral neural foraminal stenosis without spinal stenosis.   IMPRESSION: 1. Severe L4-5 facet arthrosis with grade 1 anterolisthesis and moderate to  severe spinal stenosis. 2. Mild bilateral neural foraminal stenosis at L5-S1.     Electronically Signed   By: Sebastian Ache M.D.   On: 05/01/2020 10:25     Objective:  VS:   HT:    WT:   BMI:     BP:   HR: bpm  TEMP: ( )  RESP:  Physical Exam   Imaging: No results found.

## 2022-05-13 NOTE — Progress Notes (Signed)
Functional Pain Scale - descriptive words and definitions  Distressing (6)    Pain is present/unable to complete most ADLs limited by pain/sleep is difficult and active distraction is only marginal. Moderate range order  Average Pain 8   +Driver, -BT, -Dye Allergies.  Left hip pain 

## 2022-05-22 MED ORDER — BUPIVACAINE HCL 0.25 % IJ SOLN
4.0000 mL | INTRAMUSCULAR | Status: AC | PRN
Start: 2022-05-13 — End: 2022-05-13
  Administered 2022-05-13: 4 mL via INTRA_ARTICULAR

## 2022-05-22 MED ORDER — TRIAMCINOLONE ACETONIDE 40 MG/ML IJ SUSP
40.0000 mg | INTRAMUSCULAR | Status: AC | PRN
Start: 2022-05-13 — End: 2022-05-13
  Administered 2022-05-13: 40 mg via INTRA_ARTICULAR

## 2022-05-26 ENCOUNTER — Ambulatory Visit
Admission: RE | Admit: 2022-05-26 | Discharge: 2022-05-26 | Disposition: A | Discharge status: Home / Self Care | Payer: Medicare PPO | Source: Ambulatory Visit | Attending: Family Medicine | Admitting: Family Medicine

## 2022-05-26 DIAGNOSIS — Z1231 Encounter for screening mammogram for malignant neoplasm of breast: Secondary | ICD-10-CM | POA: Diagnosis not present

## 2022-05-31 ENCOUNTER — Telehealth: Payer: Self-pay | Admitting: Physical Medicine and Rehabilitation

## 2022-05-31 NOTE — Telephone Encounter (Signed)
Patient states she is feeling some better but is great wants to speak to Technician

## 2022-06-01 ENCOUNTER — Telehealth: Payer: Self-pay | Admitting: Family Medicine

## 2022-06-01 NOTE — Telephone Encounter (Signed)
Contacted [name] to schedule their annual wellness visit. Call back at later date: REQ CB END 06/2022  Verlee Rossetti; Care Guide Ambulatory Clinical Support [place] [facility] Direct Dial: [phone]

## 2022-06-01 NOTE — Telephone Encounter (Signed)
Spoke with patient and she stated the pain on the inside of her leg went away, but the pain on the outer part of left hip is still there. She stated it feels like it's "grabbing". She also stated her knee is "popping". She wants to know what to do next. Please advise.

## 2022-06-04 NOTE — Telephone Encounter (Signed)
Spoke with patient and informed her of the information below. Transferred patient to front desk to make appointment with Dr. Jonathon Jordan

## 2022-06-06 NOTE — Progress Notes (Unsigned)
Cardiology Office Note  Date:  06/07/2022   ID:  Donna Rios, DOB 1950-03-26, MRN [id number]  PCP:  Donna Luis, MD   Chief Complaint  Patient presents with   12 month follow up     Patient c/o chest tightness/indigestion at times. Medications reviewed by the patient verbally.     HPI:  Ms. Donna Rios is a 72 y.o. woman with past medical history of Hyperlipidemia Smoking,  Severe aortic valve stenosis, TAVR Venous insufficiency  Cath in 05/2018:no significant CAD Who presents to the office today for follow-up of her bicuspid AoV with severe AS s/p TAVR (07/11/18), PAF on eliquis  LOV May 2023 Doing well, no acute changes today  Echocardiogram April 30, 2021,  Normal ejection fraction Right ventricle normal size normal function TAVR valve functioning well, appropriate gradient  Overall reports feeling well apart from chronic arthritic condition Having occasional cramp in chest, like needs to belch, Belch and pressure gets relieved  Does not like taking prednisone, completed 5 days Developed heart palpitations, off prednisone now On metoprolol prn, "i don't need it"  Chronic trace ankle swelling Reports that she needs cortisone to her back, needs to come off Eliquis 5 days  EKG personally reviewed by myself on todays visit Normal sinus rhythm rate 70 bpm no significant ST-T wave changes  Other past medical history reviewed Runs of atrial fib 4/2/202, was taking prednisone for back pain Rate 130 to 140 Tolerating her Eliquis twice daily  Echo 06/2019: EF normal, stable TAVR  echo  07/11/18. TAVR with a 26 mm Edwards Sapien 3 THV via the TF approach on She had a brief run of Afib and was cardioverted back to sinus rhythm  zio did show atrial fib, paroxysmal  CHADS VASC probably 4   Treadmill at home, but does not use it   echocardiogram 04/10/2018.   ejection fraction is normal (60 - 65%). The right side was also normal. Her aortic valve appears to have  two leaflets rather than three. It had a valve area of 0.79 cm.  Markedly elevated mean and peak systolic pressure   PMH:   has a past medical history of Allergic rhinitis, Arthritis, Fatty liver, Hypertension, Phlebitis, S/P TAVR (transcatheter aortic valve replacement), Severe aortic stenosis, Stress incontinence, and Tobacco abuse.  Aortic valve stenosis Smoker  PSH:    Past Surgical History:  Procedure Laterality Date   ABDOMINAL HYSTERECTOMY     BREAST BIOPSY Left    ducts removed   Cataract surgery     COLONOSCOPY WITH PROPOFOL N/A 09/29/2017   Procedure: COLONOSCOPY WITH PROPOFOL;  Surgeon: Toney Reil, MD;  Location: [facility] ENDOSCOPY;  Service: Gastroenterology;  Laterality: N/A;   EYE SURGERY     FOOT SURGERY     7   INTRAOPERATIVE TRANSTHORACIC ECHOCARDIOGRAM N/A 07/11/2018   Procedure: Intraoperative Transthoracic Echocardiogram;  Surgeon: Tonny Bollman, MD;  Location: [facility] OR;  Service: Open Heart Surgery;  Laterality: N/A;   KNEE ARTHROSCOPY Right    LAPAROSCOPIC HYSTERECTOMY     RIGHT HEART CATH AND CORONARY ANGIOGRAPHY N/A 06/15/2018   Procedure: RIGHT HEART CATH AND CORONARY ANGIOGRAPHY;  Surgeon: Tonny Bollman, MD;  Location: [facility] INVASIVE CV LAB;  Service: Cardiovascular;  Laterality: N/A;   TRANSCATHETER AORTIC VALVE REPLACEMENT, TRANSFEMORAL  07/11/2018   TRANSCATHETER AORTIC VALVE REPLACEMENT, TRANSFEMORAL N/A 07/11/2018   Procedure: TRANSCATHETER AORTIC VALVE REPLACEMENT, TRANSFEMORAL;  Surgeon: Tonny Bollman, MD;  Location: [facility] OR;  Service: Open Heart Surgery;  Laterality: N/A;  VARICOSE VEIN SURGERY      Current Outpatient Medications  Medication Sig Dispense Refill   acetaminophen (TYLENOL 8 HOUR ARTHRITIS PAIN) 650 MG CR tablet Take 650 mg by mouth every 8 (eight) hours as needed for pain.     APPLE CIDER VINEGAR PO Take 1 tablet by mouth daily as needed (with fatty foods).      b complex vitamins tablet Take 1 tablet by mouth 2 (two) times a week.       Calcium-Magnesium-Vitamin D (CALCIUM 1200+D3 PO) Take 1 tablet by mouth daily.     cetirizine (ZYRTEC) 10 MG tablet Take 10 mg by mouth as needed.      ELIQUIS 5 MG TABS tablet TAKE 1 TABLET BY MOUTH TWICE A DAY 180 tablet 3   fluticasone (FLONASE) 50 MCG/ACT nasal spray Place 1 spray into both nostrils daily as needed for allergies.      Lidocaine 4 % SOLN Apply topically as needed.     losartan (COZAAR) 50 MG tablet Take 1 tablet (50 mg total) by mouth daily. 90 tablet 3   metoprolol tartrate (LOPRESSOR) 25 MG tablet Take 25 mg by mouth 2 (two) times daily. (For fast heart rate >100, a-fib).     Multiple Vitamin (MULTIVITAMIN WITH MINERALS) TABS tablet Take 1 tablet by mouth daily. Women's Complete Multivitamin 50+     predniSONE (DELTASONE) 20 MG tablet Take 2 tablets (40 mg total) by mouth daily with breakfast. 10 tablet 0   Probiotic Product (PROBIOTIC ADVANCED PO) Take 1 tablet by mouth every other day.     Propylene Glycol (SYSTANE COMPLETE OP) Place 1 drop into both eyes 3 (three) times daily as needed (dry/irritated eyes.).      rosuvastatin (CRESTOR) 40 MG tablet TAKE 1 TABLET BY MOUTH EVERY DAY 90 tablet 3   No current facility-administered medications for this visit.   Allergies:   Ibuprofen, Asa [aspirin], Iohexol, and Prednisone   Social History:  The patient  reports that she has been smoking cigarettes. She has a 23.00 pack-year smoking history. She has never used smokeless tobacco. She reports that she does not currently use alcohol. She reports that she does not use drugs.   Family History:   family history includes Alcoholism in an other family member; Arthritis in an other family member; Diabetes in her mother and another family member; Heart disease in an other family member; Heart failure in her father and mother; Hypertension in her mother and another family member; Hypotension in her father; Lung cancer in an other family member; Stroke in an other family member.    Review of Systems: Review of Systems  Constitutional: Negative.   HENT: Negative.    Eyes: Negative.   Respiratory: Negative.    Cardiovascular: Negative.   Gastrointestinal: Negative.   Genitourinary: Negative.   Musculoskeletal: Negative.        Numbness in her hands  Neurological: Negative.   Psychiatric/Behavioral: Negative.    All other systems reviewed and are negative.  PHYSICAL EXAM: VS:  BP (!) 140/72 (BP Location: Left Arm, Patient Position: Sitting, Cuff Size: Normal)   Pulse 70   Ht 5\' 8"  (1.727 m)   Wt 179 lb 6 oz (81.4 kg)   SpO2 98%   BMI 27.27 kg/m  , BMI Body mass index is 27.27 kg/m. Constitutional:  oriented to person, place, and time. No distress.  HENT:  Head: Grossly normal Eyes:  no discharge. No scleral icterus.  Neck: No JVD, no carotid bruits  Cardiovascular: Regular rate and rhythm, no murmurs appreciated Pulmonary/Chest: Clear to auscultation bilaterally, no wheezes or rails Abdominal: Soft.  no distension.  no tenderness.  Musculoskeletal: Normal range of motion Neurological:  normal muscle tone. Coordination normal. No atrophy Skin: Skin warm and dry Psychiatric: normal affect, pleasant  Recent Labs: 10/27/2021: ALT 13 05/04/2022: BUN 15; Creatinine, Ser 0.89; Potassium 4.2; Sodium 139    Lipid Panel Lab Results  Component Value Date   CHOL 129 10/27/2021   HDL 51.90 10/27/2021   LDLCALC 40 10/27/2021   TRIG 187.0 (H) 10/27/2021      Wt Readings from Last 3 Encounters:  06/07/22 179 lb 6 oz (81.4 kg)  05/04/22 183 lb 12.8 oz (83.4 kg)  01/29/22 179 lb (81.2 kg)    ASSESSMENT AND PLAN: Preop cortisone She is indicated that orthopedics would like to do cortisone to her back She would be acceptable risk to come off Eliquis 5 days prior to procedure   Severe aortic valve stenosis S/p TAVR Echo 04/2021, stable valve Repeat echo ordered  Aortic atherosclerosis mild disease noted on evaluation of her CT,  Lipids at  goal  Paroxysmal atrial fibrillation Seen on event monitor,  eliquis 5 BID,  Maintaining normal sinus rhythm Denies any symptoms  Tobacco abuse We have encouraged her to continue to work on weaning her cigarettes and smoking cessation. She will continue to work on this and does not want any assistance with chantix.  1 ppd at least  Prediabetes A1C 5.9 weight stable Walking program, calorie restriction  Mixed hyperlipidemia Cholesterol is at goal on the current lipid regimen. No changes to the medications were made.  Essential HTN Blood pressure is well controlled on today's visit. No changes made to the medications.  Anxiety: Not on medication    Total encounter time more than 30 minutes  Greater than 50% was spent in counseling and coordination of care with the patient   No orders of the defined types were placed in this encounter.    Signed, Dossie Arbour, M.D., Ph.D. 06/07/2022  [facility] [place], [place] [phone]

## 2022-06-07 ENCOUNTER — Other Ambulatory Visit (HOSPITAL_COMMUNITY): Payer: Self-pay

## 2022-06-07 ENCOUNTER — Encounter: Payer: Self-pay | Admitting: Orthopedic Surgery

## 2022-06-07 ENCOUNTER — Ambulatory Visit: Payer: Medicare PPO | Attending: Cardiovascular Disease | Admitting: Cardiovascular Disease

## 2022-06-07 ENCOUNTER — Telehealth: Payer: Self-pay | Admitting: Family Medicine

## 2022-06-07 ENCOUNTER — Ambulatory Visit (INDEPENDENT_AMBULATORY_CARE_PROVIDER_SITE_OTHER): Payer: Medicare PPO

## 2022-06-07 ENCOUNTER — Telehealth: Payer: Self-pay

## 2022-06-07 ENCOUNTER — Encounter: Payer: Self-pay | Admitting: Cardiovascular Disease

## 2022-06-07 ENCOUNTER — Ambulatory Visit (INDEPENDENT_AMBULATORY_CARE_PROVIDER_SITE_OTHER): Payer: Medicare PPO | Admitting: Orthopedic Surgery

## 2022-06-07 VITALS — BP 140/72 | HR 70 | Ht 68.0 in | Wt 179.4 lb

## 2022-06-07 DIAGNOSIS — I700 Atherosclerosis of aorta: Secondary | ICD-10-CM

## 2022-06-07 DIAGNOSIS — I10 Essential (primary) hypertension: Secondary | ICD-10-CM

## 2022-06-07 DIAGNOSIS — M1712 Unilateral primary osteoarthritis, left knee: Secondary | ICD-10-CM | POA: Diagnosis not present

## 2022-06-07 DIAGNOSIS — M25562 Pain in left knee: Secondary | ICD-10-CM

## 2022-06-07 DIAGNOSIS — M5416 Radiculopathy, lumbar region: Secondary | ICD-10-CM | POA: Diagnosis not present

## 2022-06-07 DIAGNOSIS — I2510 Atherosclerotic heart disease of native coronary artery without angina pectoris: Secondary | ICD-10-CM | POA: Diagnosis not present

## 2022-06-07 DIAGNOSIS — Z720 Tobacco use: Secondary | ICD-10-CM | POA: Diagnosis not present

## 2022-06-07 DIAGNOSIS — M25552 Pain in left hip: Secondary | ICD-10-CM

## 2022-06-07 DIAGNOSIS — I350 Nonrheumatic aortic (valve) stenosis: Secondary | ICD-10-CM | POA: Diagnosis not present

## 2022-06-07 DIAGNOSIS — Z952 Presence of prosthetic heart valve: Secondary | ICD-10-CM | POA: Diagnosis not present

## 2022-06-07 DIAGNOSIS — I480 Paroxysmal atrial fibrillation: Secondary | ICD-10-CM | POA: Diagnosis not present

## 2022-06-07 DIAGNOSIS — E782 Mixed hyperlipidemia: Secondary | ICD-10-CM | POA: Diagnosis not present

## 2022-06-07 MED ORDER — BUPIVACAINE HCL 0.25 % IJ SOLN
4.0000 mL | INTRAMUSCULAR | Status: AC | PRN
Start: 2022-06-07 — End: 2022-06-07
  Administered 2022-06-07: 4 mL via INTRA_ARTICULAR

## 2022-06-07 MED ORDER — LIDOCAINE HCL 1 % IJ SOLN
5.0000 mL | INTRAMUSCULAR | Status: AC | PRN
Start: 2022-06-07 — End: 2022-06-07
  Administered 2022-06-07: 5 mL

## 2022-06-07 MED ORDER — METHYLPREDNISOLONE ACETATE 40 MG/ML IJ SUSP
40.0000 mg | INTRAMUSCULAR | Status: AC | PRN
Start: 2022-06-07 — End: 2022-06-07
  Administered 2022-06-07: 40 mg via INTRA_ARTICULAR

## 2022-06-07 NOTE — Patient Instructions (Addendum)
   Medication Instructions:  No changes  If you need a refill on your cardiac medications before your next appointment, please call your pharmacy.   Lab work: No new labs needed  Testing/Procedures:   Your physician has requested that you have an echocardiogram. Echocardiography is a painless test that uses sound waves to create images of your heart. It provides your doctor with information about the size and shape of your heart and how well your heart's chambers and valves are working.   You may receive an ultrasound enhancing agent through an IV if needed to better visualize your heart during the echo. This procedure takes approximately one hour.  There are no restrictions for this procedure.  This will take place at [address] ([facility] [address], [place] [address]  Follow-Up: At [facility], you and your health needs are our priority.  As part of our continuing mission to provide you with exceptional heart care, we have created designated Provider Care Teams.  These Care Teams include your primary Cardiologist (physician) and Advanced Practice Providers (APPs -  Physician Assistants and Nurse Practitioners) who all work together to provide you with the care you need, when you need it.  You will need a follow up appointment in 12 months  Providers on your designated Care Team:   Christopher Berge, NP Ryan Dunn, PA-C Cadence Furth, PA-C  COVID-19 Vaccine Information can be found at: [email or web address] For questions related to vaccine distribution or appointments, please email [email or web address] or call [phone].   

## 2022-06-07 NOTE — Telephone Encounter (Signed)
Pt called in stating that she needs prior authorization for med ELIQUIS 5 MG TABS tablet. As per pt, she needs this to be done asap please, she needs her meds.

## 2022-06-07 NOTE — Telephone Encounter (Signed)
Calling insurance for PA, will update in additional encounter created.

## 2022-06-07 NOTE — Addendum Note (Signed)
Addended byPrescott Parma on: 06/07/2022 10:02 AM   Modules accepted: Orders

## 2022-06-07 NOTE — Telephone Encounter (Signed)
PA request received via provider for Eliquis 5mg    Called insurance to process PA, submitted with representative.  PA has been APPROVED 06/07/2022-01/25/2023  Ref # [id number]
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
## 2022-06-08 MED ORDER — APIXABAN 5 MG PO TABS: 5.0000 mg | ORAL_TABLET | Freq: Two times a day (BID) | ORAL | 3 refills | Status: DC

## 2022-06-08 NOTE — Addendum Note (Signed)
Addended by: Glori Luis on: 06/08/2022 10:54 AM   Modules accepted: Orders

## 2022-06-08 NOTE — Telephone Encounter (Signed)
Sent to pharmacy 

## 2022-06-08 NOTE — Telephone Encounter (Signed)
Patient called back and said that her pharmacy can not refill until tomorrow. Patient is requesting a sample of apixaban (ELIQUIS) 5 MG TABS tablet from the office. She had to borrow 7 pills from her husband to take.

## 2022-06-08 NOTE — Telephone Encounter (Signed)
Patient called and wanted to know if Dr Birdie Sons will call in medication since insurance has approved. Please call patient when sent to pharmacy.

## 2022-06-08 NOTE — Telephone Encounter (Signed)
Is it okay to give pt a sample of Eliquis?

## 2022-06-09 NOTE — Telephone Encounter (Signed)
Noted.  We cannot provide her with samples to replace the Eliquis she used from her husband.  Her husband needs to contact his provider to discuss how to get this replaced.  She needs to pick up her Eliquis from the pharmacy as soon as she is able to.

## 2022-06-09 NOTE — Telephone Encounter (Signed)
I am fine with a sample being given if needed though if she can get this today from her pharmacy there should not be a need for a sample.

## 2022-06-09 NOTE — Telephone Encounter (Signed)
Patient states she needs a sample due to taking her Husband's Eliquis due to her being out of refills. Patient's Eliquis will be ready today at 4:00 but they can not pick it up until tomorrow due to her being loopy from being on Prednisone that was not a taper.

## 2022-06-10 NOTE — Telephone Encounter (Signed)
Called Patient and let her know that she will have to contact her Husband's provider to see about getting a sample to replace his medication that she took due to being out of hers instead of letting us know that she needed a refill.

## 2022-06-23 ENCOUNTER — Ambulatory Visit: Payer: Medicare PPO | Admitting: Physical Medicine and Rehabilitation

## 2022-06-23 ENCOUNTER — Other Ambulatory Visit: Payer: Self-pay

## 2022-06-23 VITALS — BP 164/84 | HR 81

## 2022-06-23 DIAGNOSIS — M5416 Radiculopathy, lumbar region: Secondary | ICD-10-CM | POA: Diagnosis not present

## 2022-06-23 DIAGNOSIS — M48062 Spinal stenosis, lumbar region with neurogenic claudication: Secondary | ICD-10-CM

## 2022-06-23 MED ORDER — METHYLPREDNISOLONE ACETATE 80 MG/ML IJ SUSP
80.0000 mg | Freq: Once | INTRAMUSCULAR | Status: AC
Start: 2022-06-23 — End: 2022-06-23
  Administered 2022-06-23: 80 mg

## 2022-06-23 NOTE — Procedures (Signed)
Lumbosacral Transforaminal Epidural Steroid Injection - Sub-Pedicular Approach with Fluoroscopic Guidance  Patient: Donna Rios      Date of Birth: May 19, 1950 MRN: [id number] PCP: Glori Luis, MD      Visit Date: 06/23/2022   Universal Protocol:    Date/Time: 06/23/2022  Consent Given By: the patient  Position: PRONE  Additional Comments: Vital signs were monitored before and after the procedure. Patient was prepped and draped in the usual sterile fashion. The correct patient, procedure, and site was verified.   Injection Procedure Details:   Procedure diagnoses: Lumbar radiculopathy [M54.16]    Meds Administered:  Meds ordered this encounter  Medications   methylPREDNISolone acetate (DEPO-MEDROL) injection 80 mg    Laterality: Left  Location/Site: L4  Needle:5.0 in., 22 ga.  Short bevel or Quincke spinal needle  Needle Placement: Transforaminal  Findings:    -Comments: Excellent flow of contrast along the nerve, nerve root and into the epidural space.  Procedure Details: After squaring off the end-plates to get a true AP view, the C-arm was positioned so that an oblique view of the foramen as noted above was visualized. The target area is just inferior to the "nose of the scotty dog" or sub pedicular. The soft tissues overlying this structure were infiltrated with 2-3 ml. of 1% Lidocaine without Epinephrine.  The spinal needle was inserted toward the target using a "trajectory" view along the fluoroscope beam.  Under AP and lateral visualization, the needle was advanced so it did not puncture dura and was located close the 6 O'Clock position of the pedical in AP tracterory. Biplanar projections were used to confirm position. Aspiration was confirmed to be negative for CSF and/or blood. A 1-2 ml. volume of Isovue-250 was injected and flow of contrast was noted at each level. Radiographs were obtained for documentation purposes.   After attaining the desired  flow of contrast documented above, a 0.5 to 1.0 ml test dose of 0.25% Marcaine was injected into each respective transforaminal space.  The patient was observed for 90 seconds post injection.  After no sensory deficits were reported, and normal lower extremity motor function was noted,   the above injectate was administered so that equal amounts of the injectate were placed at each foramen (level) into the transforaminal epidural space.   Additional Comments:  No complications occurred Dressing: 2 x 2 sterile gauze and Band-Aid    Post-procedure details: Patient was observed during the procedure. Post-procedure instructions were reviewed.  Patient left the clinic in stable condition.

## 2022-06-23 NOTE — Patient Instructions (Signed)

## 2022-06-23 NOTE — Progress Notes (Signed)
Functional Pain Scale - descriptive words and definitions  Distracting (5)    Aware of pain/able to complete some ADL's but limited by pain/sleep is affected and active distractions are only slightly useful. Moderate range order  Average Pain  varies on activity   +Driver, -BT, -Dye Allergies.  Lower back pain on left that radiates into left hip and leg to the knee. Pain in groin on the left

## 2022-06-23 NOTE — Progress Notes (Signed)
Donna Rios - 72 y.o. female MRN [id number]  Date of birth: [birth year]  Office Visit Note: Visit Date: 06/23/2022 PCP: Glori Luis, MD Referred by: Glori Luis, MD  Subjective: Chief Complaint  Patient presents with   Lower Back - Pain   HPI:  Donna Rios is a 72 y.o. female who comes in today at the request of Dr. Burnard Bunting for planned Left L4-5 Lumbar Transforaminal epidural steroid injection with fluoroscopic guidance.  The patient has failed conservative care including home exercise, medications, time and activity modification.  This injection will be diagnostic and hopefully therapeutic.  Please see requesting physician notes for further details and justification.   ROS Otherwise per HPI.  Assessment & Plan: Visit Diagnoses:    ICD-10-CM   1. Lumbar radiculopathy  M54.16 XR C-ARM NO REPORT    Epidural Steroid injection    methylPREDNISolone acetate (DEPO-MEDROL) injection 80 mg    2. Spinal stenosis of lumbar region with neurogenic claudication  M48.062       Plan: No additional findings.   Meds & Orders:  Meds ordered this encounter  Medications   methylPREDNISolone acetate (DEPO-MEDROL) injection 80 mg    Orders Placed This Encounter  Procedures   XR C-ARM NO REPORT   Epidural Steroid injection    Follow-up: Return for visit to requesting provider as needed.   Procedures: No procedures performed  Lumbosacral Transforaminal Epidural Steroid Injection - Sub-Pedicular Approach with Fluoroscopic Guidance  Patient: Donna Rios      Date of Birth: 28-Feb-1950 MRN: [id number] PCP: Glori Luis, MD      Visit Date: 06/23/2022   Universal Protocol:    Date/Time: 06/23/2022  Consent Given By: the patient  Position: PRONE  Additional Comments: Vital signs were monitored before and after the procedure. Patient was prepped and draped in the usual sterile fashion. The correct patient, procedure, and site was  verified.   Injection Procedure Details:   Procedure diagnoses: Lumbar radiculopathy [M54.16]    Meds Administered:  Meds ordered this encounter  Medications   methylPREDNISolone acetate (DEPO-MEDROL) injection 80 mg    Laterality: Left  Location/Site: L4  Needle:5.0 in., 22 ga.  Short bevel or Quincke spinal needle  Needle Placement: Transforaminal  Findings:    -Comments: Excellent flow of contrast along the nerve, nerve root and into the epidural space.  Procedure Details: After squaring off the end-plates to get a true AP view, the C-arm was positioned so that an oblique view of the foramen as noted above was visualized. The target area is just inferior to the "nose of the scotty dog" or sub pedicular. The soft tissues overlying this structure were infiltrated with 2-3 ml. of 1% Lidocaine without Epinephrine.  The spinal needle was inserted toward the target using a "trajectory" view along the fluoroscope beam.  Under AP and lateral visualization, the needle was advanced so it did not puncture dura and was located close the 6 O'Clock position of the pedical in AP tracterory. Biplanar projections were used to confirm position. Aspiration was confirmed to be negative for CSF and/or blood. A 1-2 ml. volume of Isovue-250 was injected and flow of contrast was noted at each level. Radiographs were obtained for documentation purposes.   After attaining the desired flow of contrast documented above, a 0.5 to 1.0 ml test dose of 0.25% Marcaine was injected into each respective transforaminal space.  The patient was observed for 90 seconds post injection.  After no sensory  deficits were reported, and normal lower extremity motor function was noted,   the above injectate was administered so that equal amounts of the injectate were placed at each foramen (level) into the transforaminal epidural space.   Additional Comments:  No complications occurred Dressing: 2 x 2 sterile gauze and  Band-Aid    Post-procedure details: Patient was observed during the procedure. Post-procedure instructions were reviewed.  Patient left the clinic in stable condition.    Clinical History: MRI LUMBAR SPINE WITHOUT CONTRAST   TECHNIQUE: Multiplanar, multisequence MR imaging of the lumbar spine was performed. No intravenous contrast was administered.   COMPARISON:  08/02/2019 lumbar spine radiographs   FINDINGS: Segmentation:  Standard.   Alignment: Mild lumbar levoscoliosis. 3 mm anterolisthesis of L4 on L5.   Vertebrae: No fracture or suspicious osseous lesion. Mild degenerative endplate edema at N56-O1.   Conus medullaris and cauda equina: Conus extends to the L1-2 level. Conus and cauda equina appear normal.   Paraspinal and other soft tissues: Unremarkable.   Disc levels:   Disc desiccation throughout the lumbar and lower thoracic spine. Mild disc space narrowing at T12-L1, L4-5, and L5-S1.   T11-12: Only imaged sagittally. Mild disc bulging and moderate facet arthrosis without evidence of significant stenosis.   T12-L1: Disc bulging mildly eccentric to the left and mild right and moderate left facet arthrosis without stenosis.   L1-2: Minimal disc bulging and mild facet and ligamentum flavum hypertrophy without stenosis.   L2-3: Mild disc bulging and mild facet and ligamentum flavum hypertrophy without stenosis.   L3-4: Mild disc bulging and moderate facet and ligamentum flavum hypertrophy without stenosis.   L4-5: Anterolisthesis with slight bulging of uncovered disc and severe facet and ligamentum flavum hypertrophy result in moderate to severe spinal stenosis without neural foraminal stenosis.   L5-S1: Disc bulging, endplate spurring, disc space height loss, and moderate left greater than right facet hypertrophy result in mild bilateral neural foraminal stenosis without spinal stenosis.   IMPRESSION: 1. Severe L4-5 facet arthrosis with grade 1  anterolisthesis and moderate to severe spinal stenosis. 2. Mild bilateral neural foraminal stenosis at L5-S1.     Electronically Signed   By: Sebastian Ache M.D.   On: 05/01/2020 10:25     Objective:  VS:  HT:    WT:   BMI:     BP:(!) 164/84  HR:81bpm  TEMP: ( )  RESP:  Physical Exam Vitals and nursing note reviewed.  Constitutional:      General: She is not in acute distress.    Appearance: Normal appearance. She is not ill-appearing.  HENT:     Head: Normocephalic and atraumatic.     Right Ear: External ear normal.     Left Ear: External ear normal.  Eyes:     Extraocular Movements: Extraocular movements intact.  Cardiovascular:     Rate and Rhythm: Normal rate.     Pulses: Normal pulses.  Pulmonary:     Effort: Pulmonary effort is normal. No respiratory distress.  Abdominal:     General: There is no distension.     Palpations: Abdomen is soft.  Musculoskeletal:        General: Tenderness present.     Cervical back: Neck supple.     Right lower leg: No edema.     Left lower leg: No edema.     Comments: Patient has good distal strength with no pain over the greater trochanters.  No clonus or focal weakness.  Skin:  Findings: No erythema, lesion or rash.  Neurological:     General: No focal deficit present.     Mental Status: She is alert and oriented to person, place, and time.     Sensory: No sensory deficit.     Motor: No weakness or abnormal muscle tone.     Coordination: Coordination normal.  Psychiatric:        Mood and Affect: Mood normal.        Behavior: Behavior normal.      Imaging: No results found.

## 2022-07-26 ENCOUNTER — Ambulatory Visit: Payer: Medicare PPO | Attending: Cardiovascular Disease

## 2022-07-26 DIAGNOSIS — I350 Nonrheumatic aortic (valve) stenosis: Secondary | ICD-10-CM | POA: Diagnosis not present

## 2022-07-26 DIAGNOSIS — Z952 Presence of prosthetic heart valve: Secondary | ICD-10-CM

## 2022-07-26 LAB — ECHOCARDIOGRAM COMPLETE
AR max vel: 2.45 cm2
AV Area VTI: 2.57 cm2
AV Area mean vel: 2.53 cm2
AV Mean grad: 10 mmHg
AV Peak grad: 19.2 mmHg
Ao pk vel: 2.19 m/s
Area-P 1/2: 2.81 cm2
Calc EF: 52.6 %
S' Lateral: 2.5 cm
Single Plane A2C EF: 53.2 %
Single Plane A4C EF: 50.5 %

## 2022-08-09 ENCOUNTER — Telehealth: Payer: Self-pay | Admitting: Radiology

## 2022-08-09 ENCOUNTER — Other Ambulatory Visit: Payer: Self-pay | Admitting: Physical Medicine and Rehabilitation

## 2022-08-09 DIAGNOSIS — M5416 Radiculopathy, lumbar region: Secondary | ICD-10-CM

## 2022-08-09 DIAGNOSIS — M48062 Spinal stenosis, lumbar region with neurogenic claudication: Secondary | ICD-10-CM

## 2022-08-09 NOTE — Telephone Encounter (Signed)
 I left voicemail for patient advising. 

## 2022-08-09 NOTE — Telephone Encounter (Signed)
Patient left voicemail requesting repeat injection as the pain she had is coming back. She received about 75% relief from the previous injection and was able to walk. She has now noticed that she is having a "grabbing" in the hip and is having difficulty walking again. She has noticed that she has had increasing pain again for the last 2 weeks.   Please advise. OK for repeat injection?  CB [phone]

## 2022-08-13 ENCOUNTER — Encounter: Payer: Self-pay | Admitting: Orthopedic Surgery

## 2022-08-13 ENCOUNTER — Ambulatory Visit (INDEPENDENT_AMBULATORY_CARE_PROVIDER_SITE_OTHER): Payer: Medicare PPO | Admitting: Orthopedic Surgery

## 2022-08-13 DIAGNOSIS — M1612 Unilateral primary osteoarthritis, left hip: Secondary | ICD-10-CM | POA: Diagnosis not present
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
## 2022-08-16 ENCOUNTER — Encounter: Payer: Self-pay | Admitting: Family Medicine

## 2022-08-16 ENCOUNTER — Ambulatory Visit: Payer: Medicare PPO | Admitting: Family Medicine

## 2022-08-16 VITALS — BP 140/78 | HR 75 | Temp 98.0°F | Ht 68.0 in | Wt 178.2 lb

## 2022-08-16 DIAGNOSIS — F419 Anxiety disorder, unspecified: Secondary | ICD-10-CM | POA: Diagnosis not present

## 2022-08-16 DIAGNOSIS — F17200 Nicotine dependence, unspecified, uncomplicated: Secondary | ICD-10-CM | POA: Diagnosis not present

## 2022-08-16 DIAGNOSIS — F32A Depression, unspecified: Secondary | ICD-10-CM | POA: Diagnosis not present

## 2022-08-16 DIAGNOSIS — I10 Essential (primary) hypertension: Secondary | ICD-10-CM

## 2022-08-16 DIAGNOSIS — I872 Venous insufficiency (chronic) (peripheral): Secondary | ICD-10-CM | POA: Diagnosis not present

## 2022-08-16 DIAGNOSIS — G8929 Other chronic pain: Secondary | ICD-10-CM

## 2022-08-16 DIAGNOSIS — M25552 Pain in left hip: Secondary | ICD-10-CM

## 2022-08-16 DIAGNOSIS — M5441 Lumbago with sciatica, right side: Secondary | ICD-10-CM | POA: Diagnosis not present

## 2022-08-16 NOTE — Assessment & Plan Note (Signed)
Chronic issue.  Well-controlled at home.  She will continue losartan 50 mg daily.

## 2022-08-16 NOTE — Assessment & Plan Note (Signed)
Chronic issue.  Very mild depression.  Seems to be more grief related to her stepson passing away.  Patient will continue to monitor.  Currently not on medication for this.

## 2022-08-16 NOTE — Assessment & Plan Note (Signed)
Chronic issue.  Patient has had difficulty wearing compression stockings.  She will elevate her legs is much as possible.

## 2022-08-16 NOTE — Progress Notes (Signed)
Marikay Alar, MD Phone: [phone]  Donna Rios is a 72 y.o. female who presents today for f/u.  Back pain, knee pain, hip pain: Patient notes injections in all 3 of these areas.  She notes the injection in her back helped significantly with the pain she had radiating around her side to her lower abdomen and pelvis.  She notes they are trying to get another injection approved.  She notes she can only get 1 more injection in her hip this year.  She thinks she is going to likely have a left hip replacement later this year.  Depression: Patient notes mild depression.  Her stepson passed away on Father's Day.  He has had multiple strokes previously and a heart attack and he had a heart attack on Father's Day.  She notes she has been dealing with that and seems to be doing generally okay.  She notes her blood pressure has come down since her stepson passed away.  Venous insufficiency: Patient notes ankles swollen at times.  She tries to avoid salt.  She does have compression stockings though she is unable to get them on.  She props her legs up is much as possible.  Hypertension: Blood pressures have come down to less than 140/80 over the last month.  Social History   Tobacco Use  Smoking Status Every Day   Current packs/day: 0.50   Average packs/day: 0.5 packs/day for 46.0 years (23.0 ttl pk-yrs)   Types: Cigarettes  Smokeless Tobacco Never    Current Outpatient Medications on File Prior to Visit  Medication Sig Dispense Refill   acetaminophen (TYLENOL 8 HOUR ARTHRITIS PAIN) 650 MG CR tablet Take 650 mg by mouth every 8 (eight) hours as needed for pain.     apixaban (ELIQUIS) 5 MG TABS tablet Take 1 tablet (5 mg total) by mouth 2 (two) times daily. 180 tablet 3   APPLE CIDER VINEGAR PO Take 1 tablet by mouth daily as needed (with fatty foods).      b complex vitamins tablet Take 1 tablet by mouth 2 (two) times a week.      Calcium-Magnesium-Vitamin D (CALCIUM 1200+D3 PO) Take 1  tablet by mouth daily.     cetirizine (ZYRTEC) 10 MG tablet Take 10 mg by mouth as needed.      fluticasone (FLONASE) 50 MCG/ACT nasal spray Place 1 spray into both nostrils daily as needed for allergies.      Lidocaine 4 % SOLN Apply topically as needed.     losartan (COZAAR) 50 MG tablet Take 1 tablet (50 mg total) by mouth daily. 90 tablet 3   metoprolol tartrate (LOPRESSOR) 25 MG tablet Take 25 mg by mouth 2 (two) times daily. (For fast heart rate >100, a-fib).     Multiple Vitamin (MULTIVITAMIN WITH MINERALS) TABS tablet Take 1 tablet by mouth daily. Women's Complete Multivitamin 50+     Probiotic Product (PROBIOTIC ADVANCED PO) Take 1 tablet by mouth every other day.     Propylene Glycol (SYSTANE COMPLETE OP) Place 1 drop into both eyes 3 (three) times daily as needed (dry/irritated eyes.).      rosuvastatin (CRESTOR) 40 MG tablet TAKE 1 TABLET BY MOUTH EVERY DAY 90 tablet 3   No current facility-administered medications on file prior to visit.     ROS see history of present illness  Objective  Physical Exam Vitals:   08/16/22 0935  BP: (!) 140/86  Pulse: 75  Temp: 98 F (36.7 C)  SpO2: 98%  BP Readings from Last 3 Encounters:  08/16/22 (!) 140/86  06/23/22 (!) 164/84  06/07/22 (!) 140/72   Wt Readings from Last 3 Encounters:  08/16/22 178 lb 3.2 oz (80.8 kg)  06/07/22 179 lb 6 oz (81.4 kg)  05/04/22 183 lb 12.8 oz (83.4 kg)    Physical Exam Constitutional:      General: She is not in acute distress.    Appearance: She is not diaphoretic.  Cardiovascular:     Rate and Rhythm: Normal rate and regular rhythm.     Heart sounds: Normal heart sounds.  Pulmonary:     Effort: Pulmonary effort is normal.     Breath sounds: Normal breath sounds.  Musculoskeletal:     Comments: Trace pitting edema lateral lower extremities to the mid shin  Skin:    General: Skin is warm and dry.  Neurological:     Mental Status: She is alert.      Assessment/Plan: Please see  individual problem list.  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  Patient has had some improvement with injection.  She will follow-up with orthopedics on approval for her next injection.  She will monitor.   Chronic left hip pain Assessment & Plan: Chronic issue.  Try to stay as active as she is able to with home exercises.  She will consider hip replacement as she discussed previously with orthopedics.   Primary hypertension Assessment & Plan: Chronic issue.  Well-controlled at home.  She will continue losartan 50 mg daily.   Venous insufficiency Assessment & Plan: Chronic issue.  Patient has had difficulty wearing compression stockings.  She will elevate her legs is much as possible.   Anxiety and depression Assessment & Plan: Chronic issue.  Very mild depression.  Seems to be more grief related to her stepson passing away.  Patient will continue to monitor.  Currently not on medication for this.   Smoker -     Ambulatory Referral for Lung Cancer Scre    Return in about 3 months (around 11/16/2022).   Marikay Alar, MD [facility]

## 2022-08-16 NOTE — Assessment & Plan Note (Signed)
Chronic issue.  Try to stay as active as she is able to with home exercises.  She will consider hip replacement as she discussed previously with orthopedics.

## 2022-08-16 NOTE — Assessment & Plan Note (Signed)
Chronic issue.  Patient has had some improvement with injection.  She will follow-up with orthopedics on approval for her next injection.  She will monitor.

## 2022-08-26 ENCOUNTER — Ambulatory Visit: Payer: Medicare PPO | Admitting: Physical Medicine and Rehabilitation

## 2022-08-26 ENCOUNTER — Other Ambulatory Visit: Payer: Self-pay

## 2022-08-26 VITALS — BP 140/85 | HR 70

## 2022-08-26 DIAGNOSIS — M48062 Spinal stenosis, lumbar region with neurogenic claudication: Secondary | ICD-10-CM

## 2022-08-26 DIAGNOSIS — M5416 Radiculopathy, lumbar region: Secondary | ICD-10-CM | POA: Diagnosis not present

## 2022-08-26 MED ORDER — METHYLPREDNISOLONE ACETATE 80 MG/ML IJ SUSP
80.0000 mg | Freq: Once | INTRAMUSCULAR | Status: AC
  Administered 2022-08-26: 80 mg

## 2022-08-26 NOTE — Patient Instructions (Signed)

## 2022-08-26 NOTE — Progress Notes (Signed)
Functional Pain Scale - descriptive words and definitions  Distracting (5)    Aware of pain/able to complete some ADL's but limited by pain/sleep is affected and active distractions are only slightly useful. Moderate range order  Average Pain 7   +Driver, -BT, -Dye Allergies.  Lower back pain on left side that radiates into the left leg

## 2022-08-30 ENCOUNTER — Encounter: Payer: Medicare PPO | Admitting: Physical Medicine and Rehabilitation

## 2022-09-16 ENCOUNTER — Ambulatory Visit (INDEPENDENT_AMBULATORY_CARE_PROVIDER_SITE_OTHER): Payer: Medicare PPO | Admitting: Emergency Medicine

## 2022-09-16 VITALS — Ht 68.0 in | Wt 177.0 lb

## 2022-09-16 DIAGNOSIS — Z780 Asymptomatic menopausal state: Secondary | ICD-10-CM | POA: Diagnosis not present

## 2022-09-16 DIAGNOSIS — Z0000 Encounter for general adult medical examination without abnormal findings: Secondary | ICD-10-CM | POA: Diagnosis not present

## 2022-09-16 NOTE — Patient Instructions (Addendum)
Donna Rios , Thank you for taking time to come for your Medicare Wellness Visit. I appreciate your ongoing commitment to your health goals. Please review the following plan we discussed and let me know if I can assist you in the future.   Referrals/Orders/Follow-Ups/Clinician Recommendations: Get the flu shot in the fall and covid booster when available in 6-8 weeks. Call [facility] @ [phone] to schedule the bone density test. Call [facility] @ [phone] to schedule low dose lung CT scan.   This is a list of the screening recommended for you and due dates:  Health Maintenance  Topic Date Due   Screening for Lung Cancer  10/31/2020   DEXA scan (bone density measurement)  11/07/2021   COVID-19 Vaccine (5 - 2023-24 season) 06/29/2022   Flu Shot  08/26/2022   Mammogram  05/26/2023   Medicare Annual Wellness Visit  09/16/2023   DTaP/Tdap/Td vaccine (2 - Td or Tdap) 05/07/2026   Colon Cancer Screening  09/30/2027   Pneumonia Vaccine  Completed   Hepatitis C Screening  Completed   Zoster (Shingles) Vaccine  Completed   HPV Vaccine  Aged Out   Cologuard (Stool DNA test)  Discontinued    Advanced directives: (Copy Requested) Please bring a copy of your health care power of attorney and living will to the office to be added to your chart at your convenience.  Next Medicare Annual Wellness Visit scheduled for next year: Yes, 09/22/23 @ 8:15am

## 2022-09-16 NOTE — Progress Notes (Signed)
Subjective:   Donna Rios is a 72 y.o. female who presents for Medicare Annual (Subsequent) preventive examination.  Visit Complete: Virtual  I connected with  [name] on 09/16/22 by a audio enabled telemedicine application and verified that I am speaking with the correct person using two identifiers.  Patient Location: Home  Provider Location: Home Office  I discussed the limitations of evaluation and management by telemedicine. The patient expressed understanding and agreed to proceed.  Vital Signs: Because this visit was a virtual/telehealth visit, some criteria may be missing or patient reported. Any vitals not documented were not able to be obtained and vitals that have been documented are patient reported.     Review of Systems     Cardiac Risk Factors include: advanced age (>11men, >60 women);dyslipidemia;hypertension;Other (see comment), Risk factor comments: Aortic Atherosclerosis     Objective:    Today's Vitals   09/16/22 0815 09/16/22 0816  Weight: 177 lb (80.3 kg)   Height: 5\' 8"  (1.727 m)   PainSc:  5    Body mass index is 26.91 kg/m.     09/16/2022    8:34 AM 06/11/2020   11:35 AM 06/11/2019   11:40 AM 07/11/2018    5:06 PM 07/07/2018    9:35 AM 06/21/2018   12:43 PM 06/15/2018   12:15 PM  Advanced Directives  Does Patient Have a Medical Advance Directive? Yes No No No No No No  Type of Advance Directive Healthcare Power of Attorney        Does patient want to make changes to medical advance directive? No - Patient declined Yes (MAU/Ambulatory/Procedural Areas - Information given)       Copy of Healthcare Power of Attorney in Chart? No - copy requested        Would patient like information on creating a medical advance directive?   No - Patient declined No - Patient declined No - Patient declined No - Patient declined Yes (MAU/Ambulatory/Procedural Areas - Information given)    Current Medications (verified) Outpatient Encounter Medications as of  09/16/2022  Medication Sig   acetaminophen (TYLENOL 8 HOUR ARTHRITIS PAIN) 650 MG CR tablet Take 650 mg by mouth every 8 (eight) hours as needed for pain.   apixaban (ELIQUIS) 5 MG TABS tablet Take 1 tablet (5 mg total) by mouth 2 (two) times daily.   APPLE CIDER VINEGAR PO Take 1 tablet by mouth daily as needed (with fatty foods).    b complex vitamins tablet Take 1 tablet by mouth 2 (two) times a week.    Calcium-Magnesium-Vitamin D (CALCIUM 1200+D3 PO) Take 1 tablet by mouth daily.   cetirizine (ZYRTEC) 10 MG tablet Take 10 mg by mouth as needed.    fluticasone (FLONASE) 50 MCG/ACT nasal spray Place 1 spray into both nostrils daily as needed for allergies.    Lidocaine 4 % SOLN Apply topically as needed.   losartan (COZAAR) 50 MG tablet Take 1 tablet (50 mg total) by mouth daily.   metoprolol tartrate (LOPRESSOR) 25 MG tablet Take 25 mg by mouth 2 (two) times daily. (For fast heart rate >100, a-fib).   Multiple Vitamin (MULTIVITAMIN WITH MINERALS) TABS tablet Take 1 tablet by mouth daily. Women's Complete Multivitamin 50+   Probiotic Product (PROBIOTIC ADVANCED PO) Take 1 tablet by mouth every other day.   Propylene Glycol (SYSTANE COMPLETE OP) Place 1 drop into both eyes 3 (three) times daily as needed (dry/irritated eyes.).    rosuvastatin (CRESTOR) 40 MG tablet TAKE  1 TABLET BY MOUTH EVERY DAY   Facility-Administered Encounter Medications as of 09/16/2022  Medication   methylPREDNISolone acetate (DEPO-MEDROL) injection 80 mg    Allergies (verified) Ibuprofen, Asa [aspirin], Iohexol, and Prednisone   History: Past Medical History:  Diagnosis Date   Allergic rhinitis    Arthritis    Fatty liver    Hypertension    Phlebitis    S/P TAVR (transcatheter aortic valve replacement)    Severe aortic stenosis    Stress incontinence    Tobacco abuse    Past Surgical History:  Procedure Laterality Date   ABDOMINAL HYSTERECTOMY     BREAST BIOPSY Left    ducts removed   Cataract  surgery     COLONOSCOPY WITH PROPOFOL N/A 09/29/2017   Procedure: COLONOSCOPY WITH PROPOFOL;  Surgeon: Toney Reil, MD;  Location: [facility] ENDOSCOPY;  Service: Gastroenterology;  Laterality: N/A;   EYE SURGERY     FOOT SURGERY     7   INTRAOPERATIVE TRANSTHORACIC ECHOCARDIOGRAM N/A 07/11/2018   Procedure: Intraoperative Transthoracic Echocardiogram;  Surgeon: Tonny Bollman, MD;  Location: [facility] OR;  Service: Open Heart Surgery;  Laterality: N/A;   KNEE ARTHROSCOPY Right    LAPAROSCOPIC HYSTERECTOMY     RIGHT HEART CATH AND CORONARY ANGIOGRAPHY N/A 06/15/2018   Procedure: RIGHT HEART CATH AND CORONARY ANGIOGRAPHY;  Surgeon: Tonny Bollman, MD;  Location[facility] INVASIVE CV LAB;  Service: Cardiovascular;  Laterality: N/A;   TRANSCATHETER AORTIC VALVE REPLACEMENT, TRANSFEMORAL  07/11/2018   TRANSCATHETER AORTIC VALVE REPLACEMENT, TRANSFEMORAL N/A 07/11/2018   Procedure: TRANSCATHETER AORTIC VALVE REPLACEMENT, TRANSFEMORAL;  Surgeon: Tonny Bollman, MD;  Location: [facility] OR;  Service: Open Heart Surgery;  Laterality: N/A;   VARICOSE VEIN SURGERY     Family History  Problem Relation Age of Onset   Alcoholism Other    Arthritis Other    Lung cancer Other    Heart disease Other    Stroke Other    Hypertension Other    Diabetes Other    Heart failure Mother    Hypertension Mother    Diabetes Mother    Heart failure Father    Hypotension Father    Breast cancer Neg Hx    Social History   Socioeconomic History   Marital status: Married    Spouse name: Carney Bern   Number of children: 0   Years of education: Not on file   Highest education level: Not on file  Occupational History   Occupation: retired Producer, television/film/video  Tobacco Use   Smoking status: Every Day    Current packs/day: 0.50    Average packs/day: 0.5 packs/day for 53.6 years (26.8 ttl pk-yrs)    Types: Cigarettes    Start date: 1971   Smokeless tobacco: Never  Vaping Use   Vaping status: Never Used  Substance and Sexual  Activity   Alcohol use: Yes    Comment: 1 beer monthly or less   Drug use: No   Sexual activity: Not on file  Other Topics Concern   Not on file  Social History Narrative   Not on file   Social Determinants of Health   Financial Resource Strain: Low Risk  (09/16/2022)   Overall Financial Resource Strain (CARDIA)    Difficulty of Paying Living Expenses: Not hard at all  Food Insecurity: No Food Insecurity (09/16/2022)   Hunger Vital Sign    Worried About Running Out of Food in the Last Year: Never true    Ran Out of Food in the  Last Year: Never true  Transportation Needs: No Transportation Needs (09/16/2022)   PRAPARE - Administrator, Civil Service (Medical): No    Lack of Transportation (Non-Medical): No  Physical Activity: Inactive (09/16/2022)   Exercise Vital Sign    Days of Exercise per Week: 0 days    Minutes of Exercise per Session: 0 min  Stress: No Stress Concern Present (09/16/2022)   [employer] of Occupational Health - Occupational Stress Questionnaire    Feeling of Stress : Only a little  Social Connections: Moderately Isolated (09/16/2022)   Social Connection and Isolation Panel [NHANES]    Frequency of Communication with Friends and Family: More than three times a week    Frequency of Social Gatherings with Friends and Family: Twice a week    Attends Religious Services: Never    Database administrator or Organizations: No    Attends Engineer, structural: Never    Marital Status: Married    Tobacco Counseling Ready to quit: Not Answered Counseling given: Not Answered   Clinical Intake:  Pre-visit preparation completed: Yes  Pain : 0-10 Pain Score: 5  Pain Type: Chronic pain Pain Location: Hip Pain Descriptors / Indicators: Aching     BMI - recorded: 26.91 Nutritional Status: BMI 25 -29 Overweight Nutritional Risks: None Diabetes: No  How often do you need to have someone help you when you read instructions, pamphlets, or  other written materials from your doctor or pharmacy?: 1 - Never  Interpreter Needed?: No  Information entered by :: Tora Kindred, CMA   Activities of Daily Living    09/16/2022    8:19 AM  In your present state of health, do you have any difficulty performing the following activities:  Hearing? 0  Vision? 0  Difficulty concentrating or making decisions? 0  Walking or climbing stairs? 1  Comment uses cane  Dressing or bathing? 0  Doing errands, shopping? 0  Preparing Food and eating ? N  Using the Toilet? N  In the past six months, have you accidently leaked urine? Y  Comment wears a pad  Do you have problems with loss of bowel control? N  Managing your Medications? N  Managing your Finances? N  Housekeeping or managing your Housekeeping? N    Patient Care Team: Glori Luis, MD as PCP - General (Family Medicine)  Indicate any recent Medical Services you may have received from other than [facility] providers in the past year (date may be approximate).     Assessment:   This is a routine wellness examination for Masyn.  Hearing/Vision screen Hearing Screening - Comments:: Denies hearing loss Vision Screening - Comments:: Gets routine eye exams  Dietary issues and exercise activities discussed:     Goals Addressed               This Visit's Progress     Patient Stated (pt-stated)        Exercise more with recumbent bike and control pain       Depression Screen    09/16/2022    8:32 AM 08/16/2022   10:35 AM 05/04/2022    9:53 AM 01/29/2022   10:06 AM 10/27/2021   10:05 AM 08/19/2021    9:45 AM 08/19/2021    9:28 AM  PHQ 2/9 Scores  PHQ - 2 Score 0 2 0 0 0 0 0  PHQ- 9 Score 0 2 2 0  8     Fall Risk    09/16/2022  8:37 AM 08/16/2022   10:35 AM 05/04/2022    9:53 AM 10/27/2021   10:05 AM 08/19/2021    9:28 AM  Fall Risk   Falls in the past year? 0 0 1 0 0  Number falls in past yr: 0 0 0 0 0  Injury with Fall? 0 0 1 0 0  Risk for fall due to : Impaired  balance/gait;Impaired mobility No Fall Risks History of fall(s) No Fall Risks No Fall Risks  Follow up Falls prevention discussed;Falls evaluation completed Falls evaluation completed Falls evaluation completed Falls evaluation completed Falls evaluation completed    MEDICARE RISK AT HOME: Medicare Risk at Home Any stairs in or around the home?: Yes If so, are there any without handrails?: No Home free of loose throw rugs in walkways, pet beds, electrical cords, etc?: Yes Adequate lighting in your home to reduce risk of falls?: Yes Life alert?: No Use of a cane, walker or w/c?: Yes (cane) Grab bars in the bathroom?: Yes Shower chair or bench in shower?: Yes Elevated toilet seat or a handicapped toilet?: Yes  TIMED UP AND GO:  Was the test performed?  No    Cognitive Function:    06/07/2017    9:36 AM 05/06/2016   10:23 AM  MMSE - Mini Mental State Exam  Orientation to time 5 5  Orientation to Place 5 5  Registration 3 3  Attention/ Calculation 5 5  Recall 3 2  Language- name 2 objects 2 2  Language- repeat 1 1  Language- follow 3 step command 3 3  Language- read & follow direction 1 1  Write a sentence 1 1  Copy design 1 1  Total score 30 29        09/16/2022    8:38 AM 06/11/2020   11:50 AM 06/11/2019   11:50 AM 06/09/2018    9:40 AM  6CIT Screen  What Year? 0 points 0 points 0 points 0 points  What month? 0 points 0 points 0 points 0 points  What time? 0 points 0 points 0 points 0 points  Count back from 20 0 points 0 points 0 points 0 points  Months in reverse 0 points 0 points 0 points 0 points  Repeat phrase 0 points  0 points 0 points  Total Score 0 points  0 points 0 points    Immunizations Immunization History  Administered Date(s) Administered   Covid-19, Mrna,Vaccine(Spikevax)89yrs and older 05/04/2022   Fluad Quad(high Dose 65+) 10/09/2018, 10/06/2021   Influenza, High Dose Seasonal PF 11/06/2015, 10/06/2016, 11/06/2019, 11/05/2020    Influenza-Unspecified 10/05/2017   Moderna Covid-19 Vaccine Bivalent Booster 22yrs & up 11/05/2020   Moderna SARS-COV2 Booster Vaccination 12/20/2019, 08/05/2021   Moderna Sars-Covid-2 Vaccination 04/18/2019, 05/19/2019   Pneumococcal Conjugate-13 05/06/2016   Pneumococcal Polysaccharide-23 06/07/2017, 08/15/2019   Respiratory Syncytial Virus Vaccine,Recomb Aduvanted(Arexvy) 05/04/2022   Tdap 05/06/2016   Zoster Recombinant(Shingrix) 08/15/2019, 10/18/2019    TDAP status: Up to date  Flu Vaccine status: Due, Education has been provided regarding the importance of this vaccine. Advised may receive this vaccine at local pharmacy or Health Dept. Aware to provide a copy of the vaccination record if obtained from local pharmacy or Health Dept. Verbalized acceptance and understanding.  Pneumococcal vaccine status: Up to date  Covid-19 vaccine status: Information provided on how to obtain vaccines.   Qualifies for Shingles Vaccine? Yes   Zostavax completed No   Shingrix Completed?: Yes  Screening Tests Health Maintenance  Topic Date Due  Lung Cancer Screening  10/31/2020   DEXA SCAN  11/07/2021   COVID-19 Vaccine (5 - 2023-24 season) 06/29/2022   INFLUENZA VACCINE  08/26/2022   Medicare Annual Wellness (AWV)  09/16/2023   MAMMOGRAM  05/25/2024   DTaP/Tdap/Td (2 - Td or Tdap) 05/07/2026   Colonoscopy  09/30/2027   Pneumonia Vaccine 4+ Years old  Completed   Hepatitis C Screening  Completed   Zoster Vaccines- Shingrix  Completed   HPV VACCINES  Aged Out   Fecal DNA (Cologuard)  Discontinued    Health Maintenance  Health Maintenance Due  Topic Date Due   Lung Cancer Screening  10/31/2020   DEXA SCAN  11/07/2021   COVID-19 Vaccine (5 - 2023-24 season) 06/29/2022   INFLUENZA VACCINE  08/26/2022    Colorectal cancer screening: Type of screening: Colonoscopy. Completed 09/29/17. Repeat every 10 years  Mammogram status: Completed 05/26/22. Repeat every year  Bone Density status:  Completed 11/08/19. Results reflect: Bone density results: OSTEOPENIA. Repeat every 2 years.  Lung Cancer Screening: (Low Dose CT Chest recommended if Age 25-80 years, 20 pack-year currently smoking OR have quit w/in 15years.) does qualify.   Lung Cancer Screening Referral: 08/16/22  Additional Screening:  Hepatitis C Screening: does not qualify; Completed 05/06/16  Vision Screening: Recommended annual ophthalmology exams for early detection of glaucoma and other disorders of the eye.  Dental Screening: Recommended annual dental exams for proper oral hygiene  Community Resource Referral / Chronic Care Management: CRR required this visit?  No   CCM required this visit?  No     Plan:     I have personally reviewed and noted the following in the patient's chart:   Medical and social history Use of alcohol, tobacco or illicit drugs  Current medications and supplements including opioid prescriptions. Patient is not currently taking opioid prescriptions. Functional ability and status Nutritional status Physical activity Advanced directives List of other physicians Hospitalizations, surgeries, and ER visits in previous 12 months Vitals Screenings to include cognitive, depression, and falls Referrals and appointments  In addition, I have reviewed and discussed with patient certain preventive protocols, quality metrics, and best practice recommendations. A written personalized care plan for preventive services as well as general preventive health recommendations were provided to patient.     Tora Kindred, CMA   09/16/2022   After Visit Summary: (MyChart) Due to this being a telephonic visit, the after visit summary with patients personalized plan was offered to patient via MyChart   Nurse Notes:  Will get flu shot in the fall and covid booster when available. Placed order for DEXA Scan. Gave phone # to call and schedule low dose lung CT Scan.

## 2022-09-25 NOTE — Procedures (Signed)
Lumbosacral Transforaminal Epidural Steroid Injection - Sub-Pedicular Approach with Fluoroscopic Guidance  Patient: Donna Rios      Date of Birth: May 07, 1950 MRN: [id number] PCP: Glori Luis, MD      Visit Date: 08/26/2022   Universal Protocol:    Date/Time: 08/26/2022  Consent Given By: the patient  Position: PRONE  Additional Comments: Vital signs were monitored before and after the procedure. Patient was prepped and draped in the usual sterile fashion. The correct patient, procedure, and site was verified.   Injection Procedure Details:   Procedure diagnoses: Lumbar radiculopathy [M54.16]    Meds Administered:  Meds ordered this encounter  Medications   methylPREDNISolone acetate (DEPO-MEDROL) injection 80 mg    Laterality: Left  Location/Site: L4  Needle:5.0 in., 22 ga.  Short bevel or Quincke spinal needle  Needle Placement: Transforaminal  Findings:    -Comments: Excellent flow of contrast along the nerve, nerve root and into the epidural space.  We were just completed this injection a few months ago from the same approach.  However, this time with needling and the muscle numbing medicine etc. she was very sensitive to any movement and getting close to the foramen.  She did have pain and pressure with medication delivery but did well.  Will see how she does but she did have a different response to the injection itself than prior.  All the images looked well-placed.  She may ultimately need to be a surgical candidate.  Procedure Details: After squaring off the end-plates to get a true AP view, the C-arm was positioned so that an oblique view of the foramen as noted above was visualized. The target area is just inferior to the "nose of the scotty dog" or sub pedicular. The soft tissues overlying this structure were infiltrated with 2-3 ml. of 1% Lidocaine without Epinephrine.  The spinal needle was inserted toward the target using a "trajectory" view along  the fluoroscope beam.  Under AP and lateral visualization, the needle was advanced so it did not puncture dura and was located close the 6 O'Clock position of the pedical in AP tracterory. Biplanar projections were used to confirm position. Aspiration was confirmed to be negative for CSF and/or blood. A 1-2 ml. volume of Isovue-250 was injected and flow of contrast was noted at each level. Radiographs were obtained for documentation purposes.   After attaining the desired flow of contrast documented above, a 0.5 to 1.0 ml test dose of 0.25% Marcaine was injected into each respective transforaminal space.  The patient was observed for 90 seconds post injection.  After no sensory deficits were reported, and normal lower extremity motor function was noted,   the above injectate was administered so that equal amounts of the injectate were placed at each foramen (level) into the transforaminal epidural space.   Additional Comments:   Dressing: 2 x 2 sterile gauze and Band-Aid    Post-procedure details: Patient was observed during the procedure. Post-procedure instructions were reviewed.  Patient left the clinic in stable condition.

## 2022-09-25 NOTE — Progress Notes (Signed)
Donna Rios - 72 y.o. female MRN [id number]  Date of birth: 09-28-1950  Office Visit Note: Visit Date: 08/26/2022 PCP: Glori Luis, MD Referred by: Glori Luis, MD  Subjective: Chief Complaint  Patient presents with   Lower Back - Pain   HPI:  AMELYAH BARGERSTOCK is a 72 y.o. female who comes in today at the request of Dr. Burnard Bunting for planned Left L4-5 Lumbar Transforaminal epidural steroid injection with fluoroscopic guidance.  The patient has failed conservative care including home exercise, medications, time and activity modification.  This injection will be diagnostic and hopefully therapeutic.  Please see requesting physician notes for further details and justification.   ROS Otherwise per HPI.  Assessment & Plan: Visit Diagnoses:    ICD-10-CM   1. Lumbar radiculopathy  M54.16 XR C-ARM NO REPORT    Epidural Steroid injection    methylPREDNISolone acetate (DEPO-MEDROL) injection 80 mg    2. Spinal stenosis of lumbar region with neurogenic claudication  M48.062       Plan: No additional findings.   Meds & Orders:  Meds ordered this encounter  Medications   methylPREDNISolone acetate (DEPO-MEDROL) injection 80 mg    Orders Placed This Encounter  Procedures   XR C-ARM NO REPORT   Epidural Steroid injection    Follow-up: Return for visit to requesting provider as needed.   Procedures: No procedures performed  Lumbosacral Transforaminal Epidural Steroid Injection - Sub-Pedicular Approach with Fluoroscopic Guidance  Patient: SENAYA LINLEY      Date of Birth: [birth year] MRN: [id number] PCP: Glori Luis, MD      Visit Date: 08/26/2022   Universal Protocol:    Date/Time: 08/26/2022  Consent Given By: the patient  Position: PRONE  Additional Comments: Vital signs were monitored before and after the procedure. Patient was prepped and draped in the usual sterile fashion. The correct patient, procedure, and site was  verified.   Injection Procedure Details:   Procedure diagnoses: Lumbar radiculopathy [M54.16]    Meds Administered:  Meds ordered this encounter  Medications   methylPREDNISolone acetate (DEPO-MEDROL) injection 80 mg    Laterality: Left  Location/Site: L4  Needle:5.0 in., 22 ga.  Short bevel or Quincke spinal needle  Needle Placement: Transforaminal  Findings:    -Comments: Excellent flow of contrast along the nerve, nerve root and into the epidural space.  We were just completed this injection a few months ago from the same approach.  However, this time with needling and the muscle numbing medicine etc. she was very sensitive to any movement and getting close to the foramen.  She did have pain and pressure with medication delivery but did well.  Will see how she does but she did have a different response to the injection itself than prior.  All the images looked well-placed.  She may ultimately need to be a surgical candidate.  Procedure Details: After squaring off the end-plates to get a true AP view, the C-arm was positioned so that an oblique view of the foramen as noted above was visualized. The target area is just inferior to the "nose of the scotty dog" or sub pedicular. The soft tissues overlying this structure were infiltrated with 2-3 ml. of 1% Lidocaine without Epinephrine.  The spinal needle was inserted toward the target using a "trajectory" view along the fluoroscope beam.  Under AP and lateral visualization, the needle was advanced so it did not puncture dura and was located close the 6 O'Clock position  of the pedical in AP tracterory. Biplanar projections were used to confirm position. Aspiration was confirmed to be negative for CSF and/or blood. A 1-2 ml. volume of Isovue-250 was injected and flow of contrast was noted at each level. Radiographs were obtained for documentation purposes.   After attaining the desired flow of contrast documented above, a 0.5 to 1.0 ml  test dose of 0.25% Marcaine was injected into each respective transforaminal space.  The patient was observed for 90 seconds post injection.  After no sensory deficits were reported, and normal lower extremity motor function was noted,   the above injectate was administered so that equal amounts of the injectate were placed at each foramen (level) into the transforaminal epidural space.   Additional Comments:   Dressing: 2 x 2 sterile gauze and Band-Aid    Post-procedure details: Patient was observed during the procedure. Post-procedure instructions were reviewed.  Patient left the clinic in stable condition.    Clinical History: MRI LUMBAR SPINE WITHOUT CONTRAST   TECHNIQUE: Multiplanar, multisequence MR imaging of the lumbar spine was performed. No intravenous contrast was administered.   COMPARISON:  08/02/2019 lumbar spine radiographs   FINDINGS: Segmentation:  Standard.   Alignment: Mild lumbar levoscoliosis. 3 mm anterolisthesis of L4 on L5.   Vertebrae: No fracture or suspicious osseous lesion. Mild degenerative endplate edema at E95-M8.   Conus medullaris and cauda equina: Conus extends to the L1-2 level. Conus and cauda equina appear normal.   Paraspinal and other soft tissues: Unremarkable.   Disc levels:   Disc desiccation throughout the lumbar and lower thoracic spine. Mild disc space narrowing at T12-L1, L4-5, and L5-S1.   T11-12: Only imaged sagittally. Mild disc bulging and moderate facet arthrosis without evidence of significant stenosis.   T12-L1: Disc bulging mildly eccentric to the left and mild right and moderate left facet arthrosis without stenosis.   L1-2: Minimal disc bulging and mild facet and ligamentum flavum hypertrophy without stenosis.   L2-3: Mild disc bulging and mild facet and ligamentum flavum hypertrophy without stenosis.   L3-4: Mild disc bulging and moderate facet and ligamentum flavum hypertrophy without stenosis.   L4-5:  Anterolisthesis with slight bulging of uncovered disc and severe facet and ligamentum flavum hypertrophy result in moderate to severe spinal stenosis without neural foraminal stenosis.   L5-S1: Disc bulging, endplate spurring, disc space height loss, and moderate left greater than right facet hypertrophy result in mild bilateral neural foraminal stenosis without spinal stenosis.   IMPRESSION: 1. Severe L4-5 facet arthrosis with grade 1 anterolisthesis and moderate to severe spinal stenosis. 2. Mild bilateral neural foraminal stenosis at L5-S1.     Electronically Signed   By: Sebastian Ache M.D.   On: 05/01/2020 10:25     Objective:  VS:  HT:    WT:   BMI:     BP:(!) 140/85  HR:70bpm  TEMP: ( )  RESP:  Physical Exam Vitals and nursing note reviewed.  Constitutional:      General: She is not in acute distress.    Appearance: Normal appearance. She is not ill-appearing.  HENT:     Head: Normocephalic and atraumatic.     Right Ear: External ear normal.     Left Ear: External ear normal.  Eyes:     Extraocular Movements: Extraocular movements intact.  Cardiovascular:     Rate and Rhythm: Normal rate.     Pulses: Normal pulses.  Pulmonary:     Effort: Pulmonary effort is normal. No respiratory  distress.  Abdominal:     General: There is no distension.     Palpations: Abdomen is soft.  Musculoskeletal:        General: Tenderness present.     Cervical back: Neck supple.     Right lower leg: No edema.     Left lower leg: No edema.     Comments: Patient has good distal strength with no pain over the greater trochanters.  No clonus or focal weakness.  Skin:    Findings: No erythema, lesion or rash.  Neurological:     General: No focal deficit present.     Mental Status: She is alert and oriented to person, place, and time.     Sensory: No sensory deficit.     Motor: No weakness or abnormal muscle tone.     Coordination: Coordination normal.  Psychiatric:        Mood  and Affect: Mood normal.        Behavior: Behavior normal.      Imaging: No results found.

## 2022-10-19 ENCOUNTER — Telehealth: Payer: Self-pay | Admitting: Family Medicine

## 2022-10-19 NOTE — Telephone Encounter (Signed)
She needs to be seen for this to determine what is causing this. please get her scheduled.
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
## 2022-10-19 NOTE — Telephone Encounter (Signed)
Patient is having pain in her groin for 3-4 hours before a bowel movement and no relief until after the bowel movement and it can take from 30 minutes to a few hours. Patient would like a GI referral.

## 2022-10-20 NOTE — Telephone Encounter (Signed)
Patient is scheduled for Friday 10/22/22 at 10:00.

## 2022-10-22 ENCOUNTER — Ambulatory Visit: Payer: Medicare PPO | Admitting: Family Medicine

## 2022-10-25 ENCOUNTER — Encounter: Payer: Self-pay | Admitting: Family Medicine

## 2022-10-25 ENCOUNTER — Ambulatory Visit: Payer: Medicare PPO | Admitting: Family Medicine

## 2022-10-25 VITALS — BP 132/78 | HR 76 | Temp 98.6°F | Ht 68.0 in | Wt 177.8 lb

## 2022-10-25 DIAGNOSIS — I480 Paroxysmal atrial fibrillation: Secondary | ICD-10-CM | POA: Diagnosis not present

## 2022-10-25 DIAGNOSIS — R1032 Left lower quadrant pain: Secondary | ICD-10-CM

## 2022-10-25 DIAGNOSIS — E782 Mixed hyperlipidemia: Secondary | ICD-10-CM

## 2022-10-25 DIAGNOSIS — F32A Depression, unspecified: Secondary | ICD-10-CM | POA: Diagnosis not present

## 2022-10-25 DIAGNOSIS — I10 Essential (primary) hypertension: Secondary | ICD-10-CM

## 2022-10-25 DIAGNOSIS — F419 Anxiety disorder, unspecified: Secondary | ICD-10-CM

## 2022-10-25 DIAGNOSIS — R7303 Prediabetes: Secondary | ICD-10-CM

## 2022-10-25 LAB — CBC WITH DIFFERENTIAL/PLATELET
Basophils Absolute: 0.1 10*3/uL (ref 0.0–0.1)
Basophils Relative: 0.9 % (ref 0.0–3.0)
Eosinophils Absolute: 0.1 10*3/uL (ref 0.0–0.7)
Eosinophils Relative: 1.6 % (ref 0.0–5.0)
HCT: 37.6 % (ref 36.0–46.0)
Hemoglobin: 12.4 g/dL (ref 12.0–15.0)
Lymphocytes Relative: 27.6 % (ref 12.0–46.0)
Lymphs Abs: 2.3 10*3/uL (ref 0.7–4.0)
MCHC: 33.1 g/dL (ref 30.0–36.0)
MCV: 94.8 fL (ref 78.0–100.0)
Monocytes Absolute: 0.6 10*3/uL (ref 0.1–1.0)
Monocytes Relative: 7.6 % (ref 3.0–12.0)
Neutro Abs: 5.1 10*3/uL (ref 1.4–7.7)
Neutrophils Relative %: 62.3 % (ref 43.0–77.0)
Platelets: 182 10*3/uL (ref 150.0–400.0)
RBC: 3.97 Mil/uL (ref 3.87–5.11)
RDW: 13 % (ref 11.5–15.5)
WBC: 8.2 10*3/uL (ref 4.0–10.5)

## 2022-10-25 LAB — COMPREHENSIVE METABOLIC PANEL
ALT: 13 U/L (ref 0–35)
AST: 17 U/L (ref 0–37)
Albumin: 4.2 g/dL (ref 3.5–5.2)
Alkaline Phosphatase: 76 U/L (ref 39–117)
BUN: 16 mg/dL (ref 6–23)
CO2: 26 meq/L (ref 19–32)
Calcium: 9.5 mg/dL (ref 8.4–10.5)
Chloride: 105 meq/L (ref 96–112)
Creatinine, Ser: 0.73 mg/dL (ref 0.40–1.20)
GFR: 82.31 mL/min (ref >60.00)
Glucose, Bld: 101 mg/dL — ABNORMAL HIGH (ref 70–99)
Potassium: 4.2 meq/L (ref 3.5–5.1)
Sodium: 139 meq/L (ref 135–145)
Total Bilirubin: 0.5 mg/dL (ref 0.2–1.2)
Total Protein: 6.7 g/dL (ref 6.0–8.3)

## 2022-10-25 LAB — LIPID PANEL
Cholesterol: 134 mg/dL (ref 0–200)
HDL: 66.2 mg/dL (ref >39.00)
LDL Cholesterol: 52 mg/dL (ref 0–99)
NonHDL: 68
Total CHOL/HDL Ratio: 2
Triglycerides: 80 mg/dL (ref 0.0–149.0)
VLDL: 16 mg/dL (ref 0.0–40.0)

## 2022-10-25 LAB — HEMOGLOBIN A1C: Hgb A1c MFr Bld: 5.9 % (ref 4.6–6.5)

## 2022-10-25 NOTE — Assessment & Plan Note (Addendum)
Chronic issue.  Continue Crestor 40 mg daily.  Check labs.
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
## 2022-10-25 NOTE — Assessment & Plan Note (Signed)
Chronic issue.  Adequately controlled for age.  Patient will continue losartan 50 mg daily.

## 2022-10-25 NOTE — Assessment & Plan Note (Signed)
Chronic issue.  Sinus rhythm today.  Patient will continue Eliquis 5 mg twice daily.

## 2022-10-25 NOTE — Assessment & Plan Note (Signed)
Intermittent ongoing issue.  Recently worse.  Will refer back to GI.  Will plan on getting CT abdomen and pelvis to evaluate further.

## 2022-10-25 NOTE — Patient Instructions (Signed)
Nice to see you. We are going to get a CT scan of your abdomen and pelvis to evaluate further.  I am also referring you to GI for further evaluation.
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
## 2022-10-27 ENCOUNTER — Encounter: Payer: Self-pay | Admitting: Emergency Medicine

## 2022-11-02 ENCOUNTER — Ambulatory Visit
Admission: RE | Admit: 2022-11-02 | Discharge: 2022-11-02 | Disposition: A | Discharge status: Home / Self Care | Payer: Medicare PPO | Source: Ambulatory Visit | Attending: Family Medicine | Admitting: Family Medicine

## 2022-11-02 DIAGNOSIS — R1032 Left lower quadrant pain: Secondary | ICD-10-CM | POA: Diagnosis not present

## 2022-11-02 DIAGNOSIS — Z90710 Acquired absence of both cervix and uterus: Secondary | ICD-10-CM | POA: Diagnosis not present

## 2022-11-02 DIAGNOSIS — K4090 Unilateral inguinal hernia, without obstruction or gangrene, not specified as recurrent: Secondary | ICD-10-CM | POA: Diagnosis not present

## 2022-11-03 ENCOUNTER — Ambulatory Visit: Payer: Medicare PPO | Admitting: Family Medicine

## 2022-11-17 ENCOUNTER — Encounter: Payer: Self-pay | Admitting: Family Medicine

## 2022-11-17 ENCOUNTER — Ambulatory Visit: Payer: Medicare PPO | Admitting: Family Medicine

## 2022-11-17 VITALS — BP 129/73 | HR 77 | Temp 98.5°F | Ht 68.0 in | Wt 178.2 lb

## 2022-11-17 DIAGNOSIS — F17210 Nicotine dependence, cigarettes, uncomplicated: Secondary | ICD-10-CM | POA: Diagnosis not present

## 2022-11-17 DIAGNOSIS — R1032 Left lower quadrant pain: Secondary | ICD-10-CM

## 2022-11-17 NOTE — Progress Notes (Signed)
Marikay Alar, MD Phone: [phone]  Donna Rios is a 72 y.o. female who presents today for follow-up.  Nicotine dependence, cigarettes: Patient continues to smoke a little less than a pack per day.  She is been smoking since 72 years old.  She has had lung cancer screening in the past though is due for this currently.  Notes she stopped having this done during the pandemic.  Left lower quadrant pain: This continues to be an issue.  Probiotics seem to help some.  She has a bowel movement every 3 days though it is soft and she does not have to strain.  Notes at times she will cough and she will have a sharp pain in her left lower quadrant.  She did have a CT scan that we are still waiting on the results.  Social History   Tobacco Use  Smoking Status Every Day   Current packs/day: 0.50   Average packs/day: 0.5 packs/day for 53.8 years (26.9 ttl pk-yrs)   Types: Cigarettes   Start date: 1971  Smokeless Tobacco Never    Current Outpatient Medications on File Prior to Visit  Medication Sig Dispense Refill   acetaminophen (TYLENOL 8 HOUR ARTHRITIS PAIN) 650 MG CR tablet Take 650 mg by mouth every 8 (eight) hours as needed for pain.     apixaban (ELIQUIS) 5 MG TABS tablet Take 1 tablet (5 mg total) by mouth 2 (two) times daily. 180 tablet 3   APPLE CIDER VINEGAR PO Take 1 tablet by mouth daily as needed (with fatty foods).      b complex vitamins tablet Take 1 tablet by mouth 2 (two) times a week.      Calcium-Magnesium-Vitamin D (CALCIUM 1200+D3 PO) Take 1 tablet by mouth daily.     cetirizine (ZYRTEC) 10 MG tablet Take 10 mg by mouth as needed.      fluticasone (FLONASE) 50 MCG/ACT nasal spray Place 1 spray into both nostrils daily as needed for allergies.      Lidocaine 4 % SOLN Apply topically as needed.     losartan (COZAAR) 50 MG tablet Take 1 tablet (50 mg total) by mouth daily. 90 tablet 3   Multiple Vitamin (MULTIVITAMIN WITH MINERALS) TABS tablet Take 1 tablet by mouth  daily. Women's Complete Multivitamin 50+     Probiotic Product (PROBIOTIC ADVANCED PO) Take 1 tablet by mouth every other day.     Propylene Glycol (SYSTANE COMPLETE OP) Place 1 drop into both eyes 3 (three) times daily as needed (dry/irritated eyes.).      rosuvastatin (CRESTOR) 40 MG tablet TAKE 1 TABLET BY MOUTH EVERY DAY 90 tablet 3   No current facility-administered medications on file prior to visit.     ROS see history of present illness  Objective  Physical Exam Vitals:   11/17/22 0834 11/17/22 0857  BP: 136/84 129/73  Pulse: 77   Temp: 98.5 F (36.9 C)   SpO2: 99%     BP Readings from Last 3 Encounters:  11/17/22 129/73  10/25/22 132/78  08/26/22 (!) 140/85   Wt Readings from Last 3 Encounters:  11/17/22 178 lb 3.2 oz (80.8 kg)  10/25/22 177 lb 12.8 oz (80.6 kg)  09/16/22 177 lb (80.3 kg)    Physical Exam Constitutional:      General: She is not in acute distress.    Appearance: She is not diaphoretic.  Pulmonary:     Effort: Pulmonary effort is normal.  Abdominal:     General: Bowel sounds  are normal. There is no distension.     Palpations: Abdomen is soft.     Tenderness: There is abdominal tenderness (Mild left lower quadrant tenderness). There is no guarding.  Skin:    General: Skin is warm and dry.  Neurological:     Mental Status: She is alert.      Assessment/Plan: Please see individual problem list.  Left lower quadrant pain Assessment & Plan: Chronic issue.  Discussed we would contact her when we get the CT results back.  Discussed radiology is short staffed at this time.  Advised we would likely get the result sometime in the next week.   Nicotine dependence, cigarettes, uncomplicated Assessment & Plan: Refer for lung cancer screening.  Orders: -     Ambulatory Referral for Lung Cancer Scre     Return for As scheduled.   Marikay Alar, MD [facility]

## 2022-11-17 NOTE — Assessment & Plan Note (Signed)
Refer for lung cancer screening 

## 2022-11-17 NOTE — Assessment & Plan Note (Signed)
Chronic issue.  Discussed we would contact her when we get the CT results back.  Discussed radiology is short staffed at this time.  Advised we would likely get the result sometime in the next week.

## 2022-11-26 ENCOUNTER — Encounter: Payer: Self-pay | Admitting: Family Medicine

## 2022-11-26 ENCOUNTER — Ambulatory Visit: Payer: Medicare PPO | Admitting: Family Medicine

## 2022-11-26 VITALS — BP 136/82 | HR 72 | Temp 98.0°F | Ht 68.0 in | Wt 177.0 lb

## 2022-11-26 DIAGNOSIS — R1032 Left lower quadrant pain: Secondary | ICD-10-CM

## 2022-11-26 DIAGNOSIS — M25552 Pain in left hip: Secondary | ICD-10-CM | POA: Diagnosis not present

## 2022-11-26 DIAGNOSIS — F17200 Nicotine dependence, unspecified, uncomplicated: Secondary | ICD-10-CM | POA: Insufficient documentation

## 2022-11-26 DIAGNOSIS — M1612 Unilateral primary osteoarthritis, left hip: Secondary | ICD-10-CM

## 2022-11-26 DIAGNOSIS — K4090 Unilateral inguinal hernia, without obstruction or gangrene, not specified as recurrent: Secondary | ICD-10-CM | POA: Insufficient documentation

## 2022-11-26 DIAGNOSIS — I700 Atherosclerosis of aorta: Secondary | ICD-10-CM | POA: Diagnosis not present

## 2022-11-26 DIAGNOSIS — G8929 Other chronic pain: Secondary | ICD-10-CM

## 2022-11-26 NOTE — Assessment & Plan Note (Signed)
Chronic issue.  No intra-abdominal cause noted.  Discussed much less likely to be related to the fat-containing inguinal hernia though we will refer to general surgery for this.  Discussed her discomfort is likely related to her hip.

## 2022-11-26 NOTE — Assessment & Plan Note (Signed)
Chronic issue.  Continue risk factor management.  Discussed treatment for this issue is risk factor management.

## 2022-11-26 NOTE — Assessment & Plan Note (Signed)
Chronic issue.  Refer for lung cancer screening.

## 2022-11-26 NOTE — Assessment & Plan Note (Signed)
Chronic issue.  Will refer her to a different orthopedist in [place] for a second opinion.

## 2022-11-26 NOTE — Progress Notes (Signed)
Marikay Alar, MD Phone: [phone]  Donna Rios is a 72 y.o. female who presents today for follow-up.  Left hip pain: Patient continues to have pain in her left hip.  She had a recent CT abdomen and pelvis to evaluate for alternative causes of her discomfort in this area.  The CT scan did reveal arthritic changes in her left hip.  It did reveal a fat-containing left inguinal hernia.  I had an extensive discussion with the patient that I felt as though the hernia was less likely causing her pain and that most of her pain is likely coming from her hip.  She wants a second opinion for her hip.  We also went over other findings on her x-ray including aortic atherosclerosis.  Patient is already being treated for risk factors for that.  Social History   Tobacco Use  Smoking Status Every Day   Current packs/day: 0.50   Average packs/day: 0.5 packs/day for 53.8 years (26.9 ttl pk-yrs)   Types: Cigarettes   Start date: 1971  Smokeless Tobacco Never    Current Outpatient Medications on File Prior to Visit  Medication Sig Dispense Refill   acetaminophen (TYLENOL 8 HOUR ARTHRITIS PAIN) 650 MG CR tablet Take 650 mg by mouth every 8 (eight) hours as needed for pain.     apixaban (ELIQUIS) 5 MG TABS tablet Take 1 tablet (5 mg total) by mouth 2 (two) times daily. 180 tablet 3   APPLE CIDER VINEGAR PO Take 1 tablet by mouth daily as needed (with fatty foods).      b complex vitamins tablet Take 1 tablet by mouth 2 (two) times a week.      Calcium-Magnesium-Vitamin D (CALCIUM 1200+D3 PO) Take 1 tablet by mouth daily.     cetirizine (ZYRTEC) 10 MG tablet Take 10 mg by mouth as needed.      fluticasone (FLONASE) 50 MCG/ACT nasal spray Place 1 spray into both nostrils daily as needed for allergies.      Lidocaine 4 % SOLN Apply topically as needed.     losartan (COZAAR) 50 MG tablet Take 1 tablet (50 mg total) by mouth daily. 90 tablet 3   Multiple Vitamin (MULTIVITAMIN WITH MINERALS) TABS tablet  Take 1 tablet by mouth daily. Women's Complete Multivitamin 50+     Probiotic Product (PROBIOTIC ADVANCED PO) Take 1 tablet by mouth every other day.     Propylene Glycol (SYSTANE COMPLETE OP) Place 1 drop into both eyes 3 (three) times daily as needed (dry/irritated eyes.).      rosuvastatin (CRESTOR) 40 MG tablet TAKE 1 TABLET BY MOUTH EVERY DAY 90 tablet 3   No current facility-administered medications on file prior to visit.     ROS see history of present illness  Objective  Physical Exam Vitals:   11/26/22 1451  BP: 136/82  Pulse: 72  Temp: 98 F (36.7 C)  SpO2: 97%    BP Readings from Last 3 Encounters:  11/26/22 136/82  11/17/22 129/73  10/25/22 132/78   Wt Readings from Last 3 Encounters:  11/26/22 177 lb (80.3 kg)  11/17/22 178 lb 3.2 oz (80.8 kg)  10/25/22 177 lb 12.8 oz (80.6 kg)    Physical Exam Constitutional:      General: She is not in acute distress. Pulmonary:     Effort: Pulmonary effort is normal.  Neurological:     Mental Status: She is alert.      Assessment/Plan: Please see individual problem list.  Primary osteoarthritis of  left hip -     Ambulatory referral to Orthopedic Surgery  Left inguinal hernia -     Ambulatory referral to General Surgery  Smoker Assessment & Plan: Chronic issue.  Refer for lung cancer screening.  Orders: -     Ambulatory Referral for Lung Cancer Scre  Aortic atherosclerosis (HCC) Assessment & Plan: Chronic issue.  Continue risk factor management.  Discussed treatment for this issue is risk factor management.   Left lower quadrant pain Assessment & Plan: Chronic issue.  No intra-abdominal cause noted.  Discussed much less likely to be related to the fat-containing inguinal hernia though we will refer to general surgery for this.  Discussed her discomfort is likely related to her hip.     Return in about 6 months (around 05/26/2023) for transfer of care.   Marikay Alar, MD [facility]

## 2022-11-29 ENCOUNTER — Ambulatory Visit: Payer: Medicare PPO | Admitting: Dermatology

## 2022-11-29 DIAGNOSIS — L821 Other seborrheic keratosis: Secondary | ICD-10-CM | POA: Diagnosis not present

## 2022-11-29 DIAGNOSIS — W098XXA Fall on or from other playground equipment, initial encounter: Secondary | ICD-10-CM

## 2022-11-29 DIAGNOSIS — S80812A Abrasion, left lower leg, initial encounter: Secondary | ICD-10-CM

## 2022-11-29 DIAGNOSIS — L578 Other skin changes due to chronic exposure to nonionizing radiation: Secondary | ICD-10-CM | POA: Diagnosis not present

## 2022-11-29 DIAGNOSIS — L304 Erythema intertrigo: Secondary | ICD-10-CM | POA: Diagnosis not present

## 2022-11-29 DIAGNOSIS — Z85828 Personal history of other malignant neoplasm of skin: Secondary | ICD-10-CM | POA: Diagnosis not present

## 2022-11-29 DIAGNOSIS — D225 Melanocytic nevi of trunk: Secondary | ICD-10-CM | POA: Diagnosis not present

## 2022-11-29 DIAGNOSIS — D229 Melanocytic nevi, unspecified: Secondary | ICD-10-CM

## 2022-11-29 DIAGNOSIS — L820 Inflamed seborrheic keratosis: Secondary | ICD-10-CM | POA: Diagnosis not present

## 2022-11-29 NOTE — Patient Instructions (Addendum)
Intertrigo is a chronic recurrent rash that occurs in skin fold areas that may be associated with friction; heat; moisture; yeast; fungus; and bacteria.  It is exacerbated by increased movement / activity; sweating; and higher atmospheric temperature.   Recommend OTC Zeasorb AF powder to body folds daily after shower.  It is often found in the athlete's foot section in the pharmacy.  Avoid using powders that contain cornstarch.    Gentle Skin Care Guide  1. Bathe no more than once a day.  2. Avoid bathing in hot water  3. Use a mild soap like Dove, Vanicream, Cetaphil, CeraVe. Can use Lever 2000 or Cetaphil antibacterial soap  4. Use soap only where you need it. On most days, use it under your arms, between your legs, and on your feet. Let the water rinse other areas unless visibly dirty.  5. When you get out of the bath/shower, use a towel to gently blot your skin dry, don't rub it.  6. While your skin is still a little damp, apply a moisturizing cream such as Vanicream, CeraVe, Cetaphil, Eucerin, Sarna lotion or plain Vaseline Jelly. For hands apply Neutrogena [place] Hand Cream or Excipial Hand Cream.  7. Reapply moisturizer any time you start to itch or feel dry.  8. Sometimes using free and clear laundry detergents can be helpful. Fabric softener sheets should be avoided. Downy Free & Gentle liquid, or any liquid fabric softener that is free of dyes and perfumes, it acceptable to use  9. If your doctor has given you prescription creams you may apply moisturizers over them      Seborrheic Keratosis  What causes seborrheic keratoses? Seborrheic keratoses are harmless, common skin growths that first appear during adult life.  As time goes by, more growths appear.  Some people may develop a large number of them.  Seborrheic keratoses appear on both covered and uncovered body parts.  They are not caused by sunlight.  The tendency to develop seborrheic keratoses can be inherited.   They vary in color from skin-colored to gray, brown, or even black.  They can be either smooth or have a rough, warty surface.   Seborrheic keratoses are superficial and look as if they were stuck on the skin.  Under the microscope this type of keratosis looks like layers upon layers of skin.  That is why at times the top layer may seem to fall off, but the rest of the growth remains and re-grows.    Treatment Seborrheic keratoses do not need to be treated, but can easily be removed in the office.  Seborrheic keratoses often cause symptoms when they rub on clothing or jewelry.  Lesions can be in the way of shaving.  If they become inflamed, they can cause itching, soreness, or burning.  Removal of a seborrheic keratosis can be accomplished by freezing, burning, or surgery. If any spot bleeds, scabs, or grows rapidly, please return to have it checked, as these can be an indication of a skin cancer.     Due to recent changes in healthcare laws, you may see results of your pathology and/or laboratory studies on MyChart before the doctors have had a chance to review them. We understand that in some cases there may be results that are confusing or concerning to you. Please understand that not all results are received at the same time and often the doctors may need to interpret multiple results in order to provide you with the best plan of care or course  of treatment. Therefore, we ask that you please give [place] 2 business days to thoroughly review all your results before contacting the office for clarification. Should we see a critical lab result, you will be contacted sooner.   If You Need Anything After Your Visit  If you have any questions or concerns for your doctor, please call our main line at [phone] and press option 4 to reach your doctor's medical assistant. If no one answers, please leave a voicemail as directed and we will return your call as soon as possible. Messages left after 4 pm will be  answered the following business day.   You may also send [place] a message via MyChart. We typically respond to MyChart messages within 1-2 business days.  For prescription refills, please ask your pharmacy to contact our office. Our fax number is [phone].  If you have an urgent issue when the clinic is closed that cannot wait until the next business day, you can page your doctor at the number below.    Please note that while we do our best to be available for urgent issues outside of office hours, we are not available 24/7.   If you have an urgent issue and are unable to reach [place], you may choose to seek medical care at your doctor's office, retail clinic, urgent care center, or emergency room.  If you have a medical emergency, please immediately call 911 or go to the emergency department.  Pager Numbers  - Dr. Gwen Pounds: [phone]  - Dr. Roseanne Reno: [phone]  - Dr. Katrinka Blazing: [phone]   In the event of inclement weather, please call our main line at [phone] for an update on the status of any delays or closures.  Dermatology Medication Tips: Please keep the boxes that topical medications come in in order to help keep track of the instructions about where and how to use these. Pharmacies typically print the medication instructions only on the boxes and not directly on the medication tubes.   If your medication is too expensive, please contact our office at [phone] option 4 or send [place] a message through MyChart.   We are unable to tell what your co-pay for medications will be in advance as this is different depending on your insurance coverage. However, we may be able to find a substitute medication at lower cost or fill out paperwork to get insurance to cover a needed medication.   If a prior authorization is required to get your medication covered by your insurance company, please allow [place] 1-2 business days to complete this process.  Drug prices often vary depending on  where the prescription is filled and some pharmacies may offer cheaper prices.  The website www.goodrx.com contains coupons for medications through different pharmacies. The prices here do not account for what the cost may be with help from insurance (it may be cheaper with your insurance), but the website can give you the price if you did not use any insurance.  - You can print the associated coupon and take it with your prescription to the pharmacy.  - You may also stop by our office during regular business hours and pick up a GoodRx coupon card.  - If you need your prescription sent electronically to a different pharmacy, notify our office through [facility] or by phone at [phone] option 4.     Si Usted Necesita Algo Despus de Su Visita  Tambin puede enviarnos un mensaje a travs de Clinical cytogeneticist. Por lo general respondemos a  los mensajes de MyChart en el transcurso de 1 a 2 das hbiles.  Para renovar recetas, por favor pida a su farmacia que se ponga en contacto con nuestra oficina. Annie Sable de fax es [place] [phone].  Si tiene un asunto urgente cuando la clnica est cerrada y que no puede esperar hasta el siguiente da hbil, puede llamar/localizar a su doctor(a) al nmero que aparece a continuacin.   Por favor, tenga en cuenta que aunque hacemos todo lo posible para estar disponibles para asuntos urgentes fuera del horario de [place], no estamos disponibles las 24 horas del da, los 7 [address]  [address] de la [place].   Si tiene un problema urgente y no puede comunicarse con nosotros, puede optar por buscar atencin mdica  en el consultorio de su doctor(a), en una clnica privada, en un centro de atencin urgente o en una sala de emergencias.  Si tiene Engineer, drilling, por favor llame inmediatamente al 911 o vaya a la sala de emergencias.  Nmeros de bper  - Dr. Gwen Pounds: [phone]  - Dra. Roseanne Reno: [phone]  - Dr. Katrinka Blazing: [phone]   En caso de inclemencias  del tiempo, por favor llame a Lacy Duverney principal al [phone] para una actualizacin sobre el [place] de cualquier retraso o cierre.  Consejos para la medicacin en dermatologa: Por favor, guarde las cajas en las que vienen los medicamentos de uso tpico para ayudarle a seguir las instrucciones sobre dnde y cmo usarlos. Las farmacias generalmente imprimen las instrucciones del medicamento slo en las cajas y no directamente en los tubos del [place].   Si su medicamento es muy caro, por favor, pngase en contacto con Rolm Gala llamando al [phone] y presione la opcin 4 o envenos un mensaje a travs de Clinical cytogeneticist.   No podemos decirle cul ser su copago por los medicamentos por adelantado ya que esto es diferente dependiendo de la cobertura de su seguro. Sin embargo, es posible que podamos encontrar un medicamento sustituto a Audiological scientist un formulario para que el seguro cubra el medicamento que se considera necesario.   Si se requiere una autorizacin previa para que su compaa de seguros [place] su medicamento, por favor permtanos de 1 a 2 das hbiles para completar [address]  Los precios de los medicamentos varan con frecuencia dependiendo del Environmental consultant de dnde se surte la receta y alguna farmacias pueden ofrecer precios ms baratos.  El sitio web www.goodrx.com tiene cupones para medicamentos de Health and safety inspector. Los precios aqu no tienen en cuenta lo que podra costar con la ayuda del seguro (puede ser ms barato con su seguro), pero el sitio web puede darle el precio si no utiliz Tourist information centre manager.  - Puede imprimir el cupn correspondiente y llevarlo con su receta a la farmacia.  - Tambin puede pasar por nuestra oficina durante el horario de atencin regular y Education officer, museum una tarjeta de cupones de GoodRx.  - Si necesita que su receta se enve electrnicamente a una farmacia diferente, informe a nuestra oficina a travs de MyChart de [place] o por telfono  llamando al [phone] y presione la opcin 4.

## 2022-11-29 NOTE — Progress Notes (Signed)
New Patient Visit   Subjective  Donna Rios is a 72 y.o. female who presents for the following: new patient here today concerning a spot at right lower leg that she has had for a few years. Patient also reports spots at back, inframammary, right cheek under eye area, and left lower leg. Some get irritated. Patient reports history of spot treated with chemo cream years ago at left lower leg.   Patient reports she is currently on blood thinners and concerned about bleeding.   The patient has spots, moles and lesions to be evaluated, some may be new or changing and the patient may have concern these could be cancer.   The following portions of the chart were reviewed this encounter and updated as appropriate: medications, allergies, medical history  Review of Systems:  No other skin or systemic complaints except as noted in HPI or Assessment and Plan.  Objective  Well appearing patient in no apparent distress; mood and affect are within normal limits.   A focused examination was performed of the following areas: Back, face , chest, right leg and left leg inframammary b/l   Relevant exam findings are noted in the Assessment and Plan.  right lower medial eye canthus x 3, left flank at braline x 2, right medial lower leg x 2 (7) Erythematous stuck-on, waxy papules  1.2 cm Keratotic thin plaque at right medial lower pretibial     Assessment & Plan   SEBORRHEIC KERATOSIS At back, chest, - Stuck-on, waxy, tan-brown papules and/or plaques  - Benign-appearing - Discussed benign etiology and prognosis. - Observe - Call for any changes  MELANOCYTIC NEVI At back Exam: Tan-brown and/or pink-flesh-colored symmetric macules and papules  Treatment Plan: Benign appearing on exam today. Recommend observation. Call clinic for new or changing moles. Recommend daily use of broad spectrum spf 30+ sunscreen to sun-exposed areas.   INTERTRIGO Exam Clear today  Chronic condition with  duration or expected duration over one year. Currently well-controlled.   Intertrigo is a chronic recurrent rash that occurs in skin fold areas that may be associated with friction; heat; moisture; yeast; fungus; and bacteria.  It is exacerbated by increased movement / activity; sweating; and higher atmospheric temperature.  Treatment Plan Can continue antiperspirants to body folds as tolerated  Recommend OTC Zeasorb AF powder to body folds daily after shower.  It is often found in the athlete's foot section in the pharmacy.  Avoid using powders that contain cornstarch.   EXCORIATION healing  Exam: Excoriation at left lower leg   Treatment Plan: Recommend vaseline. Call if not resolving.   ACTINIC DAMAGE - chronic, secondary to cumulative UV radiation exposure/sun exposure over time - diffuse scaly erythematous macules with underlying dyspigmentation - Recommend daily broad spectrum sunscreen SPF 30+ to sun-exposed areas, reapply every 2 hours as needed.  - Recommend staying in the shade or wearing long sleeves, sun glasses (UVA+UVB protection) and wide brim hats (4-inch brim around the entire circumference of the hat). - Call for new or changing lesions.  History of Skin Cancer  Exam:  Left lateral lower leg with dyspigmented scar H/o being treated with 5 f/u cream to affected area   Clear. Observe for recurrence. Call clinic for new or changing lesions.  Recommend regular skin exams, daily broad-spectrum spf 30+ sunscreen use, and photoprotection.      Inflamed seborrheic keratosis (7) right lower medial eye canthus x 3, left flank at braline x 2, right medial lower leg x 2  Symptomatic, irritating, patient would like treated.   Recheck right medial lower leg at follow up   Destruction of lesion - right lower medial eye canthus x 3, left flank at braline x 2, right medial lower leg x 2 (7)  Destruction method: cryotherapy   Informed consent: discussed and consent  obtained   Lesion destroyed using liquid nitrogen: Yes   Region frozen until ice ball extended beyond lesion: Yes   Outcome: patient tolerated procedure well with no complications   Post-procedure details: wound care instructions given   Additional details:  Prior to procedure, discussed risks of blister formation, small wound, skin dyspigmentation, or rare scar following cryotherapy. Recommend Vaseline ointment to treated areas while healing.     Return for 2 - 3 month isk follow up.  I, Asher Muir, CMA, am acting as scribe for Willeen Niece, MD.   Documentation: I have reviewed the above documentation for accuracy and completeness, and I agree with the above.  Willeen Niece, MD

## 2022-12-01 ENCOUNTER — Other Ambulatory Visit: Payer: Self-pay | Admitting: Emergency Medicine

## 2022-12-01 DIAGNOSIS — Z122 Encounter for screening for malignant neoplasm of respiratory organs: Secondary | ICD-10-CM

## 2022-12-01 DIAGNOSIS — Z87891 Personal history of nicotine dependence: Secondary | ICD-10-CM

## 2022-12-01 DIAGNOSIS — F17210 Nicotine dependence, cigarettes, uncomplicated: Secondary | ICD-10-CM

## 2022-12-01 NOTE — Progress Notes (Unsigned)
Celso Amy, PA-C [address]  [address]  [place], [place] [address]  Main: [phone]  Fax: [phone]   Gastroenterology Consultation  Referring Provider:     Glori Luis, MD Primary Care Physician:  Glori Luis, MD Primary Gastroenterologist:  Celso Amy, PA-C / Dr. Lannette Donath   Reason for Consultation:     Chronic LLQ abdominal pain        HPI:   Donna Rios is a 72 y.o. y/o female referred for consultation & management  by Glori Luis, MD.    She has chronic left hip pain and left low back pain.  She saw her PCP 11/17/2022 for chronic LLQ pain.  Tried probiotic with some benefit.  Has mild constipation with bowel movement every 3 days.  No straining.  LLQ pain is worse with coughing and worse before having a BM. Relieved after having a BM.  LLQ pain is worse if she lays on her left side.  Was thought to be musculoskeletal.  Patient denies any rectal bleeding or unintentional weight loss.  She has seen orthopedic who is considering left hip and left knee joint replacements due to severe osteoarthritis.  She walks with a cane.  11/02/2022: CT abdomen and pelvis without contrast: No acute abnormality.  Small left inguinal hernia containing fat.  Degenerative changes in the hips and lumbar spine.  Aortic atherosclerosis.  No evidence of diverticulitis.  She has follow-up with a surgeon to discuss inguinal hernia repair.  10/25/2022 labs: Normal CBC, hemoglobin 12.4.  Normal CMP.  Last colonoscopy done by Dr. Allegra Lai 09/2017 was normal with no polyps and no biopsies.  10-year repeat (due 09/2027).Peri Jefferson prep.  Procedure was technically difficult due to significant looping and patient's body habitus.  Past Medical History:  Diagnosis Date   Allergic rhinitis    Arthritis    Fatty liver    Hypertension    Phlebitis    S/P TAVR (transcatheter aortic valve replacement)    Severe aortic stenosis    Stress incontinence    Tobacco abuse     Past  Surgical History:  Procedure Laterality Date   ABDOMINAL HYSTERECTOMY     BREAST BIOPSY Left    ducts removed   Cataract surgery     COLONOSCOPY WITH PROPOFOL N/A 09/29/2017   Procedure: COLONOSCOPY WITH PROPOFOL;  Surgeon: Toney Reil, MD;  Location: [facility] ENDOSCOPY;  Service: Gastroenterology;  Laterality: N/A;   EYE SURGERY     FOOT SURGERY     7   INTRAOPERATIVE TRANSTHORACIC ECHOCARDIOGRAM N/A 07/11/2018   Procedure: Intraoperative Transthoracic Echocardiogram;  Surgeon: Tonny Bollman, MD;  Location[facility] OR;  Service: Open Heart Surgery;  Laterality: N/A;   KNEE ARTHROSCOPY Right    LAPAROSCOPIC HYSTERECTOMY     RIGHT HEART CATH AND CORONARY ANGIOGRAPHY N/A 06/15/2018   Procedure: RIGHT HEART CATH AND CORONARY ANGIOGRAPHY;  Surgeon: Tonny Bollman, MD;  Location: [facility] INVASIVE CV LAB;  Service: Cardiovascular;  Laterality: N/A;   TRANSCATHETER AORTIC VALVE REPLACEMENT, TRANSFEMORAL  07/11/2018   TRANSCATHETER AORTIC VALVE REPLACEMENT, TRANSFEMORAL N/A 07/11/2018   Procedure: TRANSCATHETER AORTIC VALVE REPLACEMENT, TRANSFEMORAL;  Surgeon: Tonny Bollman, MD;  Location: [facility] OR;  Service: Open Heart Surgery;  Laterality: N/A;   VARICOSE VEIN SURGERY      Prior to Admission medications   Medication Sig Start Date End Date Taking? Authorizing Provider  acetaminophen (TYLENOL 8 HOUR ARTHRITIS PAIN) 650 MG CR tablet Take 650 mg by mouth every  8 (eight) hours as needed for pain.    [provider]  apixaban (ELIQUIS) 5 MG TABS tablet Take 1 tablet (5 mg total) by mouth 2 (two) times daily. 06/08/22   Glori Luis, MD  APPLE CIDER VINEGAR PO Take 1 tablet by mouth daily as needed (with fatty foods).     [provider]  b complex vitamins tablet Take 1 tablet by mouth 2 (two) times a week.     [provider]  Calcium-Magnesium-Vitamin D (CALCIUM 1200+D3 PO) Take 1 tablet by mouth daily.    [provider]  cetirizine (ZYRTEC) 10 MG tablet Take 10  mg by mouth as needed.     [provider]  fluticasone (FLONASE) 50 MCG/ACT nasal spray Place 1 spray into both nostrils daily as needed for allergies.     [provider]  Lidocaine 4 % SOLN Apply topically as needed.    [provider]  losartan (COZAAR) 50 MG tablet Take 1 tablet (50 mg total) by mouth daily. 05/04/22   Glori Luis, MD  Multiple Vitamin (MULTIVITAMIN WITH MINERALS) TABS tablet Take 1 tablet by mouth daily. Women's Complete Multivitamin 50+    [provider]  Probiotic Product (PROBIOTIC ADVANCED PO) Take 1 tablet by mouth every other day.    [provider]  Propylene Glycol (SYSTANE COMPLETE OP) Place 1 drop into both eyes 3 (three) times daily as needed (dry/irritated eyes.).     [provider]  rosuvastatin (CRESTOR) 40 MG tablet TAKE 1 TABLET BY MOUTH EVERY DAY 03/25/22   Glori Luis, MD    Family History  Problem Relation Age of Onset   Alcoholism Other    Arthritis Other    Lung cancer Other    Heart disease Other    Stroke Other    Hypertension Other    Diabetes Other    Heart failure Mother    Hypertension Mother    Diabetes Mother    Heart failure Father    Hypotension Father    Breast cancer Neg Hx      Social History   Tobacco Use   Smoking status: Every Day    Current packs/day: 0.50    Average packs/day: 0.5 packs/day for 53.8 years (26.9 ttl pk-yrs)    Types: Cigarettes    Start date: 74   Smokeless tobacco: Never  Vaping Use   Vaping status: Never Used  Substance Use Topics   Alcohol use: Yes    Comment: 1 beer monthly or less   Drug use: No    Allergies as of 12/02/2022 - Review Complete 12/02/2022  Allergen Reaction Noted   Ibuprofen Other (See Comments) 09/17/2015   Asa [aspirin] Other (See Comments) 06/11/2015   Iohexol Rash 06/23/2018   Prednisone Palpitations 02/20/2021    Review of Systems:    All systems reviewed and negative except where noted in  HPI.   Physical Exam:  BP 138/82   Pulse 78   Temp 98.2 F (36.8 C)   Ht 5\' 8"  (1.727 m)   Wt 176 lb 6.4 oz (80 kg)   BMI 26.82 kg/m  No LMP recorded. Patient has had a hysterectomy.  General:   Alert,  Well-developed, well-nourished, pleasant and cooperative in NAD Lungs:  Respirations even and unlabored.  Clear throughout to auscultation.   No wheezes, crackles, or rhonchi. No acute distress. Heart:  Regular rate and rhythm; no murmurs, clicks, rubs, or gallops. Abdomen:  Normal bowel sounds.  No bruits.  Soft, and non-distended without masses, hepatosplenomegaly or hernias noted.  No Tenderness.  No guarding or rebound tenderness.    Neurologic:  Alert and oriented x3;  grossly normal neurologically. Psych:  Alert and cooperative. Normal mood and affect.  Imaging Studies: No results found.  Assessment and Plan:   CRISTY COLMENARES is a 72 y.o. y/o female has been referred for:  LLQ Pain -most likely musculoskeletal; recent abdominal pelvic CT showed no evidence of diverticulitis or other GI abnormality.  Constipation  -Start Magnesium 400mg  daily  -If no better in 2 weeks, then start OTC MiraLAX powder, 1 capful in a drink once daily.  3.  Left inguinal hernia containing fat  She has follow-up with general surgeon to discuss repair  4.  Colon cancer screening  10-year repeat screening colonoscopy will be due 09/2027.  Last colonoscopy 09/2017 was negative.  If she develops rectal bleeding or iron deficiency anemia, then we can consider repeat colonoscopy sooner.  Follow up 4 weeks with TG for LLQ pain and constipation.  Celso Amy, PA-C

## 2022-12-02 ENCOUNTER — Ambulatory Visit: Payer: Medicare PPO | Admitting: Physician Assistant

## 2022-12-02 ENCOUNTER — Encounter: Payer: Self-pay | Admitting: Physician Assistant

## 2022-12-02 VITALS — BP 138/82 | HR 78 | Temp 98.2°F | Ht 68.0 in | Wt 176.4 lb

## 2022-12-02 DIAGNOSIS — K4090 Unilateral inguinal hernia, without obstruction or gangrene, not specified as recurrent: Secondary | ICD-10-CM | POA: Diagnosis not present

## 2022-12-02 DIAGNOSIS — R1032 Left lower quadrant pain: Secondary | ICD-10-CM | POA: Diagnosis not present

## 2022-12-02 DIAGNOSIS — K5900 Constipation, unspecified: Secondary | ICD-10-CM | POA: Diagnosis not present

## 2022-12-02 NOTE — Patient Instructions (Signed)
Start OTC magnesium 400 mg 1 tablet once daily to help with constipation and muscle cramps.  If you are still having constipation in 2 weeks, then I recommend start OTC MiraLAX powder, mix 1 capful in a drink once daily.

## 2022-12-07 ENCOUNTER — Ambulatory Visit
Admission: RE | Admit: 2022-12-07 | Discharge: 2022-12-07 | Disposition: A | Discharge status: Home / Self Care | Payer: Medicare PPO | Source: Ambulatory Visit | Attending: Acute Care | Admitting: Acute Care

## 2022-12-07 DIAGNOSIS — Z87891 Personal history of nicotine dependence: Secondary | ICD-10-CM | POA: Insufficient documentation

## 2022-12-07 DIAGNOSIS — Z122 Encounter for screening for malignant neoplasm of respiratory organs: Secondary | ICD-10-CM | POA: Insufficient documentation

## 2022-12-07 DIAGNOSIS — F17210 Nicotine dependence, cigarettes, uncomplicated: Secondary | ICD-10-CM | POA: Diagnosis not present
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
## 2022-12-09 ENCOUNTER — Encounter: Payer: Self-pay | Admitting: Surgery

## 2022-12-09 ENCOUNTER — Ambulatory Visit: Payer: Medicare PPO | Admitting: Surgery

## 2022-12-09 VITALS — BP 140/81 | HR 73 | Temp 98.0°F | Ht 69.0 in | Wt 176.0 lb

## 2022-12-09 DIAGNOSIS — K4090 Unilateral inguinal hernia, without obstruction or gangrene, not specified as recurrent: Secondary | ICD-10-CM | POA: Diagnosis not present

## 2022-12-09 NOTE — Patient Instructions (Addendum)
We will have you follow up here in a few months for a recheck of this area. We will send you a letter about this appointment.  Call us to come in sooner if your symptoms get worse.  Inguinal Hernia, Adult An inguinal hernia is when fat or your intestines push through a weak spot in a muscle where your leg meets your lower belly (groin). This causes a bulge. This kind of hernia could also be: In your scrotum, if you are female. In folds of skin around your vagina, if you are female. There are three types of inguinal hernias: Hernias that can be pushed back into the belly (are reducible). This type rarely causes pain. Hernias that cannot be pushed back into the belly (are incarcerated). Hernias that cannot be pushed back into the belly and lose their blood supply (are strangulated). This type needs emergency surgery. What are the causes? This condition is caused by having a weak spot in the muscles or tissues in your groin. This develops over time. The hernia may poke through the weak spot when you strain your lower belly muscles all of a sudden, such as when you: Lift a heavy object. Strain to poop (have a bowel movement). Trouble pooping (constipation) can lead to straining. Cough. What increases the risk? This condition is more likely to develop in: Males. Pregnant females. People who: Are overweight. Work in jobs that require long periods of standing or heavy lifting. Have had an inguinal hernia before. Smoke or have lung disease. These factors can lead to long-term (chronic) coughing. What are the signs or symptoms? Symptoms may depend on the size of the hernia. Often, a small hernia has no symptoms. Symptoms of a larger hernia may include: A bulge in the groin area. This is easier to see when standing. You might not be able to see it when you are lying down. Pain or burning in the groin. This may get worse when you lift, strain, or cough. A dull ache or a feeling of pressure in the  groin. An abnormal bulge in the scrotum, in males. Symptoms of a strangulated inguinal hernia may include: A bulge in your groin that is very painful and tender to the touch. A bulge that turns red or purple. Fever, feeling like you may vomit (nausea), and vomiting. Not being able to poop or to pass gas. How is this treated? Treatment depends on the size of your hernia and whether you have symptoms. If you do not have symptoms, your doctor may have you watch your hernia carefully and have you come in for follow-up visits. If your hernia is large or if you have symptoms, you may need surgery to repair the hernia. Follow these instructions at home: Lifestyle Avoid lifting heavy objects. Avoid standing for long amounts of time. Do not smoke or use any products that contain nicotine or tobacco. If you need help quitting, ask your doctor. Stay at a healthy weight. Prevent trouble pooping You may need to take these actions to prevent or treat trouble pooping: Drink enough fluid to keep your pee (urine) pale yellow. Take over-the-counter or prescription medicines. Eat foods that are high in fiber. These include beans, whole grains, and fresh fruits and vegetables. Limit foods that are high in fat and sugar. These include fried or sweet foods. General instructions You may try to push your hernia back in place by very gently pressing on it when you are lying down. Do not try to push the bulge back  in if it will not go in easily. Watch your hernia for any changes in shape, size, or color. Tell your doctor if you see any changes. Take over-the-counter and prescription medicines only as told by your doctor. Keep all follow-up visits. Contact a doctor if: You have a fever or chills. You have new symptoms. Your symptoms get worse. Get help right away if: You have pain in your groin that gets worse all of a sudden. You have a bulge in your groin that: Gets bigger all of a sudden, and it does not  get smaller after that. Turns red or purple. Is painful when you touch it. You are a female, and you have: Sudden pain in your scrotum. A sudden change in the size of your scrotum. You cannot push the hernia back in place by very gently pressing on it when you are lying down. You feel like you may vomit, and that feeling does not go away. You keep vomiting. You have a fast heartbeat. You cannot poop or pass gas. These symptoms may be an emergency. Get help right away. Call your local emergency services (911 in the [place] Do not wait to see if the symptoms will go away. Do not drive yourself to the hospital. Summary An inguinal hernia is when fat or your intestines push through a weak spot in a muscle where your leg meets your lower belly (groin). This causes a bulge. If you do not have symptoms, you may not need treatment. If you have symptoms or a large hernia, you may need surgery. Avoid lifting heavy objects. Also, avoid standing for long amounts of time. Do not try to push the bulge back in if it will not go in easily. This information is not intended to replace advice given to you by your health care provider. Make sure you discuss any questions you have with your health care provider. Document Revised: 09/11/2019 Document Reviewed: 09/11/2019 Elsevier Patient Education  2024 [employer].

## 2022-12-14 ENCOUNTER — Ambulatory Visit
Admission: RE | Admit: 2022-12-14 | Discharge: 2022-12-14 | Disposition: A | Discharge status: Home / Self Care | Payer: Medicare PPO | Source: Ambulatory Visit | Attending: Family Medicine | Admitting: Family Medicine

## 2022-12-14 DIAGNOSIS — Z780 Asymptomatic menopausal state: Secondary | ICD-10-CM | POA: Insufficient documentation

## 2022-12-14 DIAGNOSIS — M8589 Other specified disorders of bone density and structure, multiple sites: Secondary | ICD-10-CM | POA: Diagnosis not present

## 2022-12-31 ENCOUNTER — Other Ambulatory Visit: Payer: Self-pay | Admitting: Acute Care

## 2022-12-31 DIAGNOSIS — Z122 Encounter for screening for malignant neoplasm of respiratory organs: Secondary | ICD-10-CM

## 2022-12-31 DIAGNOSIS — F17210 Nicotine dependence, cigarettes, uncomplicated: Secondary | ICD-10-CM

## 2022-12-31 DIAGNOSIS — Z87891 Personal history of nicotine dependence: Secondary | ICD-10-CM

## 2023-01-05 NOTE — Progress Notes (Unsigned)
Celso Amy, PA-C [address]  [address]  [place], [place] [address]  Main: [phone]  Fax: [phone]   Primary Care Physician: Glori Luis, MD  Primary Gastroenterologist:  Celso Amy, PA-C / Dr. Lannette Donath    CC: Follow-up LLQ pain, constipation, left hip pain, left low back pain  HPI: Donna Rios is a 72 y.o. female returns for 5-week follow-up of LLQ pain, constipation, left hip and left back pain.  5 weeks ago she was on magnesium 400 mg daily.  She had elevated blood pressures on magnesium, therefore she discontinued.  She switched to MiraLAX 1 capful every day with benefit.  Constipation has greatly improved on MiraLAX.  Currently having soft bowel movement daily or every other day.  She denies hard stools, straining, or rectal bleeding.  LLQ pain has improved.  She has minor soreness in the LLQ.  She continues to have severe left hip and left back pain from severe arthritis.  Has had hip, back, and knee injections.  Has follow-up with orthopedic.  She saw general surgeon who did not recommend inguinal hernia repair.   11/02/2022: CT abdomen and pelvis without contrast: No acute abnormality.  Small left inguinal hernia containing fat.  Degenerative changes in the hips and lumbar spine.  Aortic atherosclerosis.  No evidence of diverticulitis.  She has follow-up with a surgeon to discuss inguinal hernia repair.   10/25/2022 labs: Normal CBC, hemoglobin 12.4.  Normal CMP.   Last colonoscopy done by Dr. Allegra Lai 09/2017 was normal with no polyps and no biopsies.  10-year repeat (due 09/2027).Peri Jefferson prep.  Procedure was technically difficult due to significant looping and patient's body habitus.  Current Outpatient Medications  Medication Sig Dispense Refill   acetaminophen (TYLENOL 8 HOUR ARTHRITIS PAIN) 650 MG CR tablet Take 650 mg by mouth every 8 (eight) hours as needed for pain.     apixaban (ELIQUIS) 5 MG TABS tablet Take 1 tablet (5 mg total) by mouth 2  (two) times daily. 180 tablet 3   APPLE CIDER VINEGAR PO Take 1 tablet by mouth daily as needed (with fatty foods).      b complex vitamins tablet Take 1 tablet by mouth 2 (two) times a week.      Calcium-Magnesium-Vitamin D (CALCIUM 1200+D3 PO) Take 1 tablet by mouth daily.     cetirizine (ZYRTEC) 10 MG tablet Take 10 mg by mouth as needed.      fluticasone (FLONASE) 50 MCG/ACT nasal spray Place 1 spray into both nostrils daily as needed for allergies.      Lidocaine 4 % SOLN Apply topically as needed.     losartan (COZAAR) 50 MG tablet Take 1 tablet (50 mg total) by mouth daily. 90 tablet 3   Multiple Vitamin (MULTIVITAMIN WITH MINERALS) TABS tablet Take 1 tablet by mouth daily. Women's Complete Multivitamin 50+     Probiotic Product (PROBIOTIC ADVANCED PO) Take 1 tablet by mouth every other day.     Propylene Glycol (SYSTANE COMPLETE OP) Place 1 drop into both eyes 3 (three) times daily as needed (dry/irritated eyes.).      rosuvastatin (CRESTOR) 40 MG tablet TAKE 1 TABLET BY MOUTH EVERY DAY 90 tablet 3   No current facility-administered medications for this visit.    Allergies as of 01/06/2023 - Review Complete 01/06/2023  Allergen Reaction Noted   Ibuprofen Other (See Comments) 09/17/2015   Asa [aspirin] Other (See Comments) 06/11/2015   Iohexol Rash 06/23/2018  Prednisone Palpitations 02/20/2021    Past Medical History:  Diagnosis Date   Allergic rhinitis    Arthritis    Fatty liver    Hypertension    Phlebitis    S/P TAVR (transcatheter aortic valve replacement)    Severe aortic stenosis    Stress incontinence    Tobacco abuse     Past Surgical History:  Procedure Laterality Date   ABDOMINAL HYSTERECTOMY     BREAST BIOPSY Left    ducts removed   Cataract surgery     COLONOSCOPY WITH PROPOFOL N/A 09/29/2017   Procedure: COLONOSCOPY WITH PROPOFOL;  Surgeon: Toney Reil, MD;  Location: [facility] ENDOSCOPY;  Service: Gastroenterology;  Laterality: N/A;   EYE SURGERY      FOOT SURGERY     7   INTRAOPERATIVE TRANSTHORACIC ECHOCARDIOGRAM N/A 07/11/2018   Procedure: Intraoperative Transthoracic Echocardiogram;  Surgeon: Tonny Bollman, MD;  Location: [facility];  Service: Open Heart Surgery;  Laterality: N/A;   KNEE ARTHROSCOPY Right    LAPAROSCOPIC HYSTERECTOMY     RIGHT HEART CATH AND CORONARY ANGIOGRAPHY N/A 06/15/2018   Procedure: RIGHT HEART CATH AND CORONARY ANGIOGRAPHY;  Surgeon: Tonny Bollman, MD;  Location: [facility] INVASIVE CV LAB;  Service: Cardiovascular;  Laterality: N/A;   TRANSCATHETER AORTIC VALVE REPLACEMENT, TRANSFEMORAL  07/11/2018   TRANSCATHETER AORTIC VALVE REPLACEMENT, TRANSFEMORAL N/A 07/11/2018   Procedure: TRANSCATHETER AORTIC VALVE REPLACEMENT, TRANSFEMORAL;  Surgeon: Tonny Bollman, MD;  Location: [facility] OR;  Service: Open Heart Surgery;  Laterality: N/A;   VARICOSE VEIN SURGERY      Review of Systems:    All systems reviewed and negative except where noted in HPI.   Physical Examination:   BP 135/71   Pulse 76   Temp 97.7 F (36.5 C)   Ht 5\' 9"  (1.753 m)   Wt 172 lb 12.8 oz (78.4 kg)   BMI 25.52 kg/m   General: Well-nourished, well-developed in no acute distress.  Walks with a cane and a limp.  Difficult walking due to left hip and left knee pain. Neuro: Alert and oriented x 3.  Grossly intact.  Psych: Alert and cooperative, normal mood and affect.   Imaging Studies: DG Bone Density Result Date: 12/15/2022 EXAM: DUAL X-RAY ABSORPTIOMETRY (DXA) FOR BONE MINERAL DENSITY IMPRESSION: Your patient Donna Rios completed a BMD test on 12/14/2022 using the [employer] DXA System (software version: 14.10) manufactured by [employer]. The following summarizes the results of our evaluation. Technologist:VLM PATIENT BIOGRAPHICAL: Name: Donna Rios, Donna Rios Patient ID: [id number] Birth Date: [birth year] Height: 69.0 in. Gender: Female Exam Date: 12/14/2022 Weight: 176.0 lbs. Indications: Oophorectomy Bilateral, Caucasian, Height Loss,  Hysterectomy, Postmenopausal, Tobacco User (Current Smoker) Fractures: Left elbow Treatments: calcium w/ vit D DENSITOMETRY RESULTS: Site         Region      Measured Date Measured Age WHO Classification Young Adult T-score BMD         %Change vs. Previous Significant Change (*) AP Spine L1-L3 12/14/2022 72.3 Osteopenia -2.1 0.928 g/cm2 -10.3% Yes AP Spine L1-L3 11/08/2019 69.2 Osteopenia -1.2 1.035 g/cm2 - - DualFemur Total Right 12/14/2022 72.3 Osteopenia -2.0 0.751 g/cm2 -7.1% Yes DualFemur Total Right 11/08/2019 69.2 Osteopenia -1.6 0.808 g/cm2 -4.7% Yes DualFemur Total Right 08/10/2017 66.9 Osteopenia -1.3 0.848 g/cm2 - - DualFemur Total Mean 12/14/2022 72.3 Osteopenia -1.6 0.805 g/cm2 -2.8% Yes DualFemur Total Mean 11/08/2019 69.2 Osteopenia -1.4 0.828 g/cm2 -7.0% Yes DualFemur Total Mean 08/10/2017 66.9 Normal -0.9 0.890 g/cm2 - - Left Forearm Radius 33%  12/14/2022 72.3 Osteopenia -1.4 0.753 g/cm2 - - ASSESSMENT: The BMD measured at AP Spine L1-L3 is 0.928 g/cm2 with a T-score of -2.1. This patient is considered osteopenic according to World Health Organization [facility]) criteria. L-4 was excluded due to degenerative changes. The scan quality was limited due to limited range of motion of the femurs. Compared with prior study, there has been significant decrease in the spine. Compared with prior study, there has been significant decrease in the total hip. World Science writer [facility]) criteria for post-menopausal, Caucasian Women: Normal:                   T-score at or above -1 SD Osteopenia/low bone mass: T-score between -1 and -2.5 SD Osteoporosis:             T-score at or below -2.5 SD RECOMMENDATIONS: 1. All patients should optimize calcium and vitamin D intake. 2. Consider FDA-approved medical therapies in postmenopausal women and men aged 86 years and older, based on the following: a. A hip or vertebral(clinical or morphometric) fracture b. T-score < -2.5 at the femoral neck or spine after appropriate  evaluation to exclude secondary causes c. Low bone mass (T-score between -1.0 and -2.5 at the femoral neck or spine) and a 10-year probability of a hip fracture > 3% or a 10-year probability of a major osteoporosis-related fracture > 20% based on the [place]-adapted WHO algorithm 3. Clinician judgment and/or patient preferences may indicate treatment for people with 10-year fracture probabilities above or below these levels FOLLOW-UP: People with diagnosed cases of osteoporosis or at high risk for fracture should have regular bone mineral density tests. For patients eligible for Medicare, routine testing is allowed once every 2 years. The testing frequency can be increased to one year for patients who have rapidly progressing disease, those who are receiving or discontinuing medical therapy to restore bone mass, or have additional risk factors. I have reviewed this report, and agree with the above findings. [facility] Dear Dana Allan, Your patient Donna Rios completed a FRAX assessment on 12/14/2022 using the [employer] iDXA DXA System (analysis version: 14.10) manufactured by [employer]. The following summarizes the results of our evaluation. PATIENT BIOGRAPHICAL: Name: Donna Rios, Donna Rios Patient ID: [id number] Birth Date: 06/26/1950 Height:    69.0 in. Gender:     Female    [age]       Weight:    176.0 lbs. Ethnicity:  White                            Exam Date: 12/14/2022 FRAX* RESULTS:  (version: 3.5) 10-year Probability of Fracture1 Major Osteoporotic Fracture2 Hip Fracture 9.3% 1.8% Population: [place] (Caucasian) Risk Factors: Tobacco User (Current Smoker) Based on Femur (Right) Neck BMD 1 -The 10-year probability of fracture may be lower than reported if the patient has received treatment. 2 -Major Osteoporotic Fracture: Clinical Spine, Forearm, Hip or Shoulder *FRAX is a Armed forces logistics/support/administrative officer of the [employer] of [employer] for Metabolic Bone Disease, a World Science writer  (WHO) [employer]. ASSESSMENT: The probability of a major osteoporotic fracture is 9.3% within the next ten years. The probability of a hip fracture is 1.8% within the next ten years. Electronically Signed   By: Romona Curls M.D.   On: 12/15/2022 13:41    Assessment and Plan:   Donna Rios is a 72 y.o. y/o female returns  for follow-up of:  1.  LLQ pain - improved on MiraLAX  Reassurance regarding normal CT scan.  Continue MiraLAX daily for constipation.   2.  Left hip, left knee, left back pain.  Severe osteoarthritis.  Follow-up with orthopedic specialist to discuss surgery.  3.  Constipation  Continue MiraLAX 1 capful every day.  Continue high-fiber diet, prunes, 64 ounces of fluids daily.  4.  Colon cancer screening  Normal colonoscopy 09/2017.  10-year repeat screening colonoscopy will be due 09/2027.  Follow-up sooner if she develops rectal bleeding, anemia, or alarm symptoms.  Celso Amy, PA-C  Follow up as needed.

## 2023-01-06 ENCOUNTER — Ambulatory Visit: Payer: Medicare PPO | Admitting: Physician Assistant

## 2023-01-06 ENCOUNTER — Encounter: Payer: Self-pay | Admitting: Physician Assistant

## 2023-01-06 VITALS — BP 135/71 | HR 76 | Temp 97.7°F | Ht 69.0 in | Wt 172.8 lb

## 2023-01-06 DIAGNOSIS — K5900 Constipation, unspecified: Secondary | ICD-10-CM | POA: Diagnosis not present

## 2023-01-06 DIAGNOSIS — K5904 Chronic idiopathic constipation: Secondary | ICD-10-CM

## 2023-01-06 DIAGNOSIS — R1032 Left lower quadrant pain: Secondary | ICD-10-CM

## 2023-01-06 DIAGNOSIS — G8929 Other chronic pain: Secondary | ICD-10-CM

## 2023-01-24 ENCOUNTER — Ambulatory Visit: Payer: Medicare PPO | Admitting: Family Medicine

## 2023-01-24 ENCOUNTER — Encounter: Payer: Self-pay | Admitting: Family Medicine

## 2023-01-24 VITALS — BP 121/67 | HR 70 | Temp 98.3°F | Ht 69.0 in | Wt 168.6 lb

## 2023-01-24 DIAGNOSIS — M25562 Pain in left knee: Secondary | ICD-10-CM | POA: Diagnosis not present

## 2023-01-24 DIAGNOSIS — G8929 Other chronic pain: Secondary | ICD-10-CM | POA: Diagnosis not present

## 2023-01-24 DIAGNOSIS — M7711 Lateral epicondylitis, right elbow: Secondary | ICD-10-CM | POA: Diagnosis not present

## 2023-01-24 DIAGNOSIS — M7710 Lateral epicondylitis, unspecified elbow: Secondary | ICD-10-CM | POA: Insufficient documentation

## 2023-01-24 DIAGNOSIS — I10 Essential (primary) hypertension: Secondary | ICD-10-CM

## 2023-01-24 MED ORDER — KNEE BRACE/HINGED BARS MEDIUM MISC: 0 refills | Status: DC

## 2023-01-24 MED ORDER — ROSUVASTATIN CALCIUM 40 MG PO TABS: 40.0000 mg | ORAL_TABLET | Freq: Every day | ORAL | 3 refills | Status: DC

## 2023-01-24 NOTE — Patient Instructions (Addendum)
Nice to see you. Please get a tennis elbow brace and try that to see if that helps with your symptoms. You can ice your elbow for 10 minutes 3 times daily. Please make sure you follow-up with your orthopedist for your knee.

## 2023-01-24 NOTE — Assessment & Plan Note (Signed)
Exam is consistent with tennis elbow.  Discussed tennis elbow brace and icing her elbow.  Discussed icing 10 minutes 3 times daily.  Advised against icing any longer than that given risk of nerve damage from icing.  If not improving over the next several weeks we can refer to orthopedics.

## 2023-01-24 NOTE — Progress Notes (Signed)
Marikay Alar, MD Phone: [phone]  Donna Rios is a 72 y.o. female who presents today for f/u.  Hypertension: Typically 120s-130s/70s-80s.  No chest pain or shortness of breath.  She is taking losartan.  Chronic left knee pain: Patient know she has been wearing a knee sleeve to help try to support this though it seems to restricting.  She wonders about getting a knee brace.  She plans to follow-up with orthopedics and is planning on having surgery on her hip or her knee sometime this year.  Right elbow pain: Patient notes this has been going on 3 weeks.  She notes she bumped her inner elbow though did not think anything of it and did not feel as though it is injured.  No she has been using a cane and thinks that may be contributing.  Notes ice has been helpful to a certain degree.  Notes most of her pain is along the lateral epicondyles area.  Social History   Tobacco Use  Smoking Status Every Day   Current packs/day: 0.50   Average packs/day: 0.5 packs/day for 54.0 years (27.0 ttl pk-yrs)   Types: Cigarettes   Start date: 1971   Passive exposure: Past  Smokeless Tobacco Never    Current Outpatient Medications on File Prior to Visit  Medication Sig Dispense Refill   acetaminophen (TYLENOL 8 HOUR ARTHRITIS PAIN) 650 MG CR tablet Take 650 mg by mouth every 8 (eight) hours as needed for pain.     apixaban (ELIQUIS) 5 MG TABS tablet Take 1 tablet (5 mg total) by mouth 2 (two) times daily. 180 tablet 3   APPLE CIDER VINEGAR PO Take 1 tablet by mouth daily as needed (with fatty foods).      b complex vitamins tablet Take 1 tablet by mouth 2 (two) times a week.      Calcium-Magnesium-Vitamin D (CALCIUM 1200+D3 PO) Take 1 tablet by mouth daily.     cetirizine (ZYRTEC) 10 MG tablet Take 10 mg by mouth as needed.      fluticasone (FLONASE) 50 MCG/ACT nasal spray Place 1 spray into both nostrils daily as needed for allergies.      Lidocaine 4 % SOLN Apply topically as needed.      losartan (COZAAR) 50 MG tablet Take 1 tablet (50 mg total) by mouth daily. 90 tablet 3   Multiple Vitamin (MULTIVITAMIN WITH MINERALS) TABS tablet Take 1 tablet by mouth daily. Women's Complete Multivitamin 50+     Probiotic Product (PROBIOTIC ADVANCED PO) Take 1 tablet by mouth every other day.     Propylene Glycol (SYSTANE COMPLETE OP) Place 1 drop into both eyes 3 (three) times daily as needed (dry/irritated eyes.).      No current facility-administered medications on file prior to visit.     ROS see history of present illness  Objective  Physical Exam Vitals:   01/24/23 0951 01/24/23 1009  BP: 124/82 121/67  Pulse: 70   Temp: 98.3 F (36.8 C)   SpO2: 98%     BP Readings from Last 3 Encounters:  01/24/23 121/67  01/06/23 135/71  12/09/22 (!) 140/81   Wt Readings from Last 3 Encounters:  01/24/23 168 lb 9.6 oz (76.5 kg)  01/06/23 172 lb 12.8 oz (78.4 kg)  12/09/22 176 lb (79.8 kg)    Physical Exam Constitutional:      General: She is not in acute distress.    Appearance: She is not diaphoretic.  Cardiovascular:     Rate and Rhythm:  Normal rate and regular rhythm.     Heart sounds: Normal heart sounds.  Pulmonary:     Effort: Pulmonary effort is normal.     Breath sounds: Normal breath sounds.  Musculoskeletal:     Comments: Patient has tenderness at the right elbow lateral epicondyle, discomfort in the area on resisted pronation  Skin:    General: Skin is warm and dry.  Neurological:     Mental Status: She is alert.      Assessment/Plan: Please see individual problem list.  Primary hypertension Assessment & Plan: Chronic issue.  Adequately controlled for age.  Patient will continue losartan 50 mg daily.   Chronic pain of left knee Assessment & Plan: Chronic issue.  Prescription given for knee brace to offer additional support.  Encouraged patient to see her orthopedist to consider surgical intervention.   Lateral epicondylitis of right  elbow Assessment & Plan: Exam is consistent with tennis elbow.  Discussed tennis elbow brace and icing her elbow.  Discussed icing 10 minutes 3 times daily.  Advised against icing any longer than that given risk of nerve damage from icing.  If not improving over the next several weeks we can refer to orthopedics.   Other orders -     Rosuvastatin Calcium; Take 1 tablet (40 mg total) by mouth daily.  Dispense: 90 tablet; Refill: 3 -     Knee Brace/Hinged Bars Medium; Use daily while awake for knee support. Dx code Z61.096, G89.29  Dispense: 1 each; Refill: 0     Return in about 3 months (around 04/24/2023) for Transfer of care.   Marikay Alar, MD [facility]

## 2023-01-24 NOTE — Assessment & Plan Note (Signed)
Chronic issue.  Adequately controlled for age.  Patient will continue losartan 50 mg daily.

## 2023-01-24 NOTE — Assessment & Plan Note (Signed)
Chronic issue.  Prescription given for knee brace to offer additional support.  Encouraged patient to see her orthopedist to consider surgical intervention.

## 2023-01-28 ENCOUNTER — Ambulatory Visit: Payer: Medicare PPO | Admitting: Surgical

## 2023-01-28 ENCOUNTER — Encounter: Payer: Self-pay | Admitting: Surgical

## 2023-01-28 ENCOUNTER — Other Ambulatory Visit (INDEPENDENT_AMBULATORY_CARE_PROVIDER_SITE_OTHER): Payer: Self-pay

## 2023-01-28 DIAGNOSIS — M1612 Unilateral primary osteoarthritis, left hip: Secondary | ICD-10-CM

## 2023-01-28 DIAGNOSIS — M25521 Pain in right elbow: Secondary | ICD-10-CM

## 2023-01-28 DIAGNOSIS — M5416 Radiculopathy, lumbar region: Secondary | ICD-10-CM | POA: Diagnosis not present

## 2023-01-28 DIAGNOSIS — M1712 Unilateral primary osteoarthritis, left knee: Secondary | ICD-10-CM

## 2023-01-28 MED ORDER — LIDOCAINE HCL 1 % IJ SOLN
5.0000 mL | INTRAMUSCULAR | Status: AC | PRN
  Administered 2023-01-28: 5 mL

## 2023-01-28 MED ORDER — BUPIVACAINE HCL 0.25 % IJ SOLN
4.0000 mL | INTRAMUSCULAR | Status: AC | PRN
  Administered 2023-01-28: 4 mL via INTRA_ARTICULAR

## 2023-01-28 MED ORDER — METHYLPREDNISOLONE ACETATE 40 MG/ML IJ SUSP
40.0000 mg | INTRAMUSCULAR | Status: AC | PRN
  Administered 2023-01-28: 40 mg via INTRA_ARTICULAR
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
## 2023-02-02 ENCOUNTER — Telehealth: Payer: Self-pay | Admitting: Cardiovascular Disease

## 2023-02-02 NOTE — Telephone Encounter (Signed)
 Please advise holding Eliquis prior to left total hip.   Thank you!  DW

## 2023-02-02 NOTE — Telephone Encounter (Signed)
   Pre-operative Risk Assessment    Patient Name: Donna Rios  DOB: 1950-11-22 MRN: [id number]   Date of last office visit: 07/26/22 Date of next office visit: n/a   Request for Surgical Clearance    Procedure:  Left total hip  Date of Surgery:  Clearance TBD                                Surgeon:  KANDICE Glendia Hutchinson, MD Surgeon's Group or Practice Name:  [facility] number:  [phone] Fax number:  [phone]   Type of Clearance Requested: Pre op instructions for blood thinner     Type of Anesthesia:  Not Indicated   Additional requests/questions:    Signed, Arsenio Celine GAILS   02/02/2023, 8:05 AM

## 2023-02-05 NOTE — Telephone Encounter (Signed)
 Patient with diagnosis of atrial fibrillation on Eliquis  for anticoagulation.    Procedure:  Left total hip   Date of Surgery:  Clearance TBD   CHA2DS2-VASc Score = 3   This indicates a 3.2% annual risk of stroke. The patient's score is based upon: CHF History: 0 HTN History: 1 Diabetes History: 0 Stroke History: 0 Vascular Disease History: 0 Age Score: 1 Gender Score: 1  CrCl 84 Platelet count 182  Per office protocol, patient can hold Eliquis  for 3 days prior to procedure.   Patient will not need bridging with Lovenox (enoxaparin) around procedure.  **This guidance is not considered finalized until pre-operative APP has relayed final recommendations.**

## 2023-02-07 NOTE — Telephone Encounter (Signed)
   Name: Donna Rios  DOB: Nov 02, 1950  MRN: [id number]  Primary Cardiologist: Dr. Perla    Preoperative team, please contact this patient and set up a phone call appointment for further preoperative risk assessment. Please obtain consent and complete medication review. Thank you for your help.  I confirm that guidance regarding antiplatelet and oral anticoagulation therapy has been completed and, if necessary, noted below.  CHA2DS2-VASc Score = 3   This indicates a 3.2% annual risk of stroke. The patient's score is based upon: CHF History: 0 HTN History: 1 Diabetes History: 0 Stroke History: 0 Vascular Disease History: 0 Age Score: 1 Gender Score: 1   CrCl 84 Platelet count 182   Per office protocol, patient can hold Eliquis  for 3 days prior to procedure.   Patient will not need bridging with Lovenox (enoxaparin) around procedure.  I also confirmed the patient resides in the state of [place] . As per [facility] telemedicine laws, the patient must reside in the state in which the provider is licensed.   Lamarr Satterfield, NP 02/07/2023, 8:27 AM [facility]

## 2023-02-08 ENCOUNTER — Telehealth: Payer: Self-pay

## 2023-02-08 NOTE — Telephone Encounter (Signed)
 Called patient no answer left a detailed message to call back to schedule preop clearance telephone visit

## 2023-02-08 NOTE — Telephone Encounter (Signed)
 Patient called back and was scheduled for telephone visit patient voiced understanding

## 2023-02-08 NOTE — Telephone Encounter (Signed)
 Patient has been scheduled for surgical clearance telephone visit and consent is done

## 2023-02-14 ENCOUNTER — Ambulatory Visit: Payer: Medicare PPO | Admitting: Dermatology

## 2023-02-15 HISTORY — PX: TOOTH EXTRACTION: SUR596

## 2023-02-17 ENCOUNTER — Other Ambulatory Visit: Payer: Self-pay

## 2023-02-17 ENCOUNTER — Ambulatory Visit: Payer: Medicare PPO | Admitting: Physical Medicine and Rehabilitation

## 2023-02-17 ENCOUNTER — Ambulatory Visit: Payer: Medicare PPO | Attending: Nurse Practitioner

## 2023-02-17 DIAGNOSIS — Z01810 Encounter for preprocedural cardiovascular examination: Secondary | ICD-10-CM | POA: Diagnosis not present

## 2023-02-17 DIAGNOSIS — M5416 Radiculopathy, lumbar region: Secondary | ICD-10-CM

## 2023-02-17 MED ORDER — METHYLPREDNISOLONE ACETATE 40 MG/ML IJ SUSP
40.0000 mg | Freq: Once | INTRAMUSCULAR | Status: AC
  Administered 2023-02-17: 40 mg

## 2023-02-17 NOTE — Progress Notes (Signed)
Functional Pain Scale - descriptive words and definitions  Distressing (6)    Pain is present/unable to complete most ADLs limited by pain/sleep is difficult and active distraction is only marginal. Moderate range order  Average Pain 6  Having injection today. Back pain is worse on the left.  +Driver, -BT, -Dye Allergies.

## 2023-02-17 NOTE — Patient Instructions (Signed)

## 2023-02-17 NOTE — Progress Notes (Signed)
Virtual Visit via Telephone Note   Because of Donna Rios's co-morbid illnesses, she is at least at moderate risk for complications without adequate follow up.  This format is felt to be most appropriate for this patient at this time.  The patient did not have access to video technology/had technical difficulties with video requiring transitioning to audio format only (telephone).  All issues noted in this document were discussed and addressed.  No physical exam could be performed with this format.  Please refer to the patient's chart for her consent to telehealth for [name].  Evaluation Performed:  Preoperative cardiovascular risk assessment _____________   Date:  02/17/2023   Patient ID:  Donna Rios, DOB January 29, 1950, MRN [id number] Patient Location:  Home Provider location:   Office  Primary Care Provider:  Glori Luis, MD Primary Cardiologist:  None  Chief Complaint / Patient Profile   73 y.o. y/o female with a h/o aortic stenosis s/p TAVR 06/2018, PAF (on Eliquis), HLD, HTN, anxiety, prediabetes tobacco abuse who is pending left total hip arthroplasty and presents today for telephonic preoperative cardiovascular risk assessment.  History of Present Illness    Donna Rios is a 73 y.o. female who presents via audio/video conferencing for a telehealth visit today.  Pt was last seen in cardiology clinic on 06/07/2022 by Dr. Mariah Milling.  At that time Donna Rios was doing well but endorsed chest tightness and indigestion at times.  During her visit patient was doing well with no new cardiac complaints and BP was well-controlled. She was given clearance at that time for cortisone injection in her back. The patient is now pending procedure as outlined above. Since her last visit, she has been doing well with no new cardiac complaints.  She is limited with activity due to hip pain and knee pain but is able to be independent in her task daily.  She denies chest  pain, shortness of breath, lower extremity edema, fatigue, palpitations, melena, hematuria, hemoptysis, diaphoresis, weakness, presyncope, syncope, orthopnea, and PND.    Past Medical History    Past Medical History:  Diagnosis Date   Allergic rhinitis    Arthritis    Fatty liver    Hypertension    Phlebitis    S/P TAVR (transcatheter aortic valve replacement)    Severe aortic stenosis    Stress incontinence    Tobacco abuse    Past Surgical History:  Procedure Laterality Date   ABDOMINAL HYSTERECTOMY     BREAST BIOPSY Left    ducts removed   Cataract surgery     COLONOSCOPY WITH PROPOFOL N/A 09/29/2017   Procedure: COLONOSCOPY WITH PROPOFOL;  Surgeon: Toney Reil, MD;  Location: [facility] ENDOSCOPY;  Service: Gastroenterology;  Laterality: N/A;   EYE SURGERY     FOOT SURGERY     7   INTRAOPERATIVE TRANSTHORACIC ECHOCARDIOGRAM N/A 07/11/2018   Procedure: Intraoperative Transthoracic Echocardiogram;  Surgeon: Tonny Bollman, MD;  Location: [facility] OR;  Service: Open Heart Surgery;  Laterality: N/A;   KNEE ARTHROSCOPY Right    LAPAROSCOPIC HYSTERECTOMY     RIGHT HEART CATH AND CORONARY ANGIOGRAPHY N/A 06/15/2018   Procedure: RIGHT HEART CATH AND CORONARY ANGIOGRAPHY;  Surgeon: Tonny Bollman, MD;  Location: [facility] INVASIVE CV LAB;  Service: Cardiovascular;  Laterality: N/A;   TRANSCATHETER AORTIC VALVE REPLACEMENT, TRANSFEMORAL  07/11/2018   TRANSCATHETER AORTIC VALVE REPLACEMENT, TRANSFEMORAL N/A 07/11/2018   Procedure: TRANSCATHETER AORTIC VALVE REPLACEMENT, TRANSFEMORAL;  Surgeon: Tonny Bollman, MD;  Location: [facility]  OR;  Service: Open Heart Surgery;  Laterality: N/A;   VARICOSE VEIN SURGERY      Allergies  Allergies  Allergen Reactions   Ibuprofen Other (See Comments)    High does causes stomach pains   Asa [Aspirin] Other (See Comments)    UNSPECIFIED REACTION    Iohexol Rash    Pt had reaction after 06/21/18 TAVR CT scans   Prednisone Palpitations    Home Medications     Prior to Admission medications   Medication Sig Start Date End Date Taking? Authorizing Provider  acetaminophen (TYLENOL 8 HOUR ARTHRITIS PAIN) 650 MG CR tablet Take 650 mg by mouth every 8 (eight) hours as needed for pain.    [provider]  apixaban (ELIQUIS) 5 MG TABS tablet Take 1 tablet (5 mg total) by mouth 2 (two) times daily. 06/08/22   Glori Luis, MD  APPLE CIDER VINEGAR PO Take 1 tablet by mouth daily as needed (with fatty foods).     [provider]  b complex vitamins tablet Take 1 tablet by mouth 2 (two) times a week.     [provider]  Calcium-Magnesium-Vitamin D (CALCIUM 1200+D3 PO) Take 1 tablet by mouth daily.    [provider]  cetirizine (ZYRTEC) 10 MG tablet Take 10 mg by mouth as needed.     [provider]  Elastic Bandages & Supports (KNEE BRACE/HINGED BARS MEDIUM) MISC Use daily while awake for knee support. Dx code Z61.096, G89.29 01/24/23   Glori Luis, MD  fluticasone [facility]) 50 MCG/ACT nasal spray Place 1 spray into both nostrils daily as needed for allergies.     [provider]  Lidocaine 4 % SOLN Apply topically as needed.    [provider]  losartan (COZAAR) 50 MG tablet Take 1 tablet (50 mg total) by mouth daily. 05/04/22   Glori Luis, MD  Multiple Vitamin (MULTIVITAMIN WITH MINERALS) TABS tablet Take 1 tablet by mouth daily. Women's Complete Multivitamin 50+    [provider]  Probiotic Product (PROBIOTIC ADVANCED PO) Take 1 tablet by mouth every other day.    [provider]  Propylene Glycol (SYSTANE COMPLETE OP) Place 1 drop into both eyes 3 (three) times daily as needed (dry/irritated eyes.).     [provider]  rosuvastatin (CRESTOR) 40 MG tablet Take 1 tablet (40 mg total) by mouth daily. 01/24/23   Glori Luis, MD    Physical Exam    Vital Signs:  Donna Rios does not have vital signs available for review today.  Given  telephonic nature of communication, physical exam is limited. AAOx3. NAD. Normal affect.  Speech and respirations are unlabored.  Accessory Clinical Findings    None  Assessment & Plan    1.  Preoperative Cardiovascular Risk Assessment: -Patient's RCRI score is 6.6%  The patient affirms she has been doing well without any new cardiac symptoms. They are able to achieve 5 METS without cardiac limitations. Therefore, based on ACC/AHA guidelines, the patient would be at acceptable risk for the planned procedure without further cardiovascular testing. The patient was advised that if she develops new symptoms prior to surgery to contact our office to arrange for a follow-up visit, and she verbalized understanding.    The patient was advised that if she develops new symptoms prior to surgery to contact our office to arrange for a follow-up visit, and she verbalized understanding.  Per office protocol, patient can hold Eliquis for 3 days prior  to procedure.    A copy of this note will be routed to requesting surgeon.  Time:   Today, I have spent 8 minutes with the patient with telehealth technology discussing medical history, symptoms, and management plan.     Napoleon Form, Leodis Rains, NP  02/17/2023, 7:50 AM

## 2023-02-24 NOTE — Procedures (Signed)
Lumbosacral Transforaminal Epidural Steroid Injection - Sub-Pedicular Approach with Fluoroscopic Guidance  Patient: Donna Rios      Date of Birth: 1950-02-17 MRN: [id number] PCP: Glori Luis, MD      Visit Date: 02/17/2023   Universal Protocol:    Date/Time: 02/17/2023  Consent Given By: the patient  Position: PRONE  Additional Comments: Vital signs were monitored before and after the procedure. Patient was prepped and draped in the usual sterile fashion. The correct patient, procedure, and site was verified.   Injection Procedure Details:   Procedure diagnoses: Lumbar radiculopathy [M54.16]    Meds Administered:  Meds ordered this encounter  Medications   methylPREDNISolone acetate (DEPO-MEDROL) injection 40 mg    Laterality: Left  Location/Site: L4  Needle:5.0 in., 22 ga.  Short bevel or Quincke spinal needle  Needle Placement: Transforaminal  Findings:    -Comments: Excellent flow of contrast along the nerve, nerve root and into the epidural space.  Procedure Details: After squaring off the end-plates to get a true AP view, the C-arm was positioned so that an oblique view of the foramen as noted above was visualized. The target area is just inferior to the "nose of the scotty dog" or sub pedicular. The soft tissues overlying this structure were infiltrated with 2-3 ml. of 1% Lidocaine without Epinephrine.  The spinal needle was inserted toward the target using a "trajectory" view along the fluoroscope beam.  Under AP and lateral visualization, the needle was advanced so it did not puncture dura and was located close the 6 O'Clock position of the pedical in AP tracterory. Biplanar projections were used to confirm position. Aspiration was confirmed to be negative for CSF and/or blood. A 1-2 ml. volume of Isovue-250 was injected and flow of contrast was noted at each level. Radiographs were obtained for documentation purposes.   After attaining the desired  flow of contrast documented above, a 0.5 to 1.0 ml test dose of 0.25% Marcaine was injected into each respective transforaminal space.  The patient was observed for 90 seconds post injection.  After no sensory deficits were reported, and normal lower extremity motor function was noted,   the above injectate was administered so that equal amounts of the injectate were placed at each foramen (level) into the transforaminal epidural space.   Additional Comments:  No complications occurred Dressing: 2 x 2 sterile gauze and Band-Aid    Post-procedure details: Patient was observed during the procedure. Post-procedure instructions were reviewed.  Patient left the clinic in stable condition.

## 2023-02-24 NOTE — Progress Notes (Signed)
Donna Rios - 73 y.o. female MRN [id number]  Date of birth: 1950-03-19  Office Visit Note: Visit Date: 02/17/2023 PCP: Glori Luis, MD Referred by: Glori Luis, MD  Subjective: Chief Complaint  Patient presents with   Lower Back - Pain   HPI:  Donna Rios is a 73 y.o. female who comes in today at the request of Karenann Cai, PA-C for planned Left L4-5 Lumbar Transforaminal epidural steroid injection with fluoroscopic guidance.  The patient has failed conservative care including home exercise, medications, time and activity modification.  This injection will be diagnostic and hopefully therapeutic.  Please see requesting physician notes for further details and justification.   ROS Otherwise per HPI.  Assessment & Plan: Visit Diagnoses:    ICD-10-CM   1. Lumbar radiculopathy  M54.16 XR C-ARM NO REPORT    Epidural Steroid injection    methylPREDNISolone acetate (DEPO-MEDROL) injection 40 mg      Plan: No additional findings.   Meds & Orders:  Meds ordered this encounter  Medications   methylPREDNISolone acetate (DEPO-MEDROL) injection 40 mg    Orders Placed This Encounter  Procedures   XR C-ARM NO REPORT   Epidural Steroid injection    Follow-up: Return for visit to requesting provider as needed.   Procedures: No procedures performed  Lumbosacral Transforaminal Epidural Steroid Injection - Sub-Pedicular Approach with Fluoroscopic Guidance  Patient: Donna Rios      Date of Birth: 12/06/1950 MRN: [id number] PCP: Glori Luis, MD      Visit Date: 02/17/2023   Universal Protocol:    Date/Time: 02/17/2023  Consent Given By: the patient  Position: PRONE  Additional Comments: Vital signs were monitored before and after the procedure. Patient was prepped and draped in the usual sterile fashion. The correct patient, procedure, and site was verified.   Injection Procedure Details:   Procedure diagnoses: Lumbar radiculopathy [M54.16]     Meds Administered:  Meds ordered this encounter  Medications   methylPREDNISolone acetate (DEPO-MEDROL) injection 40 mg    Laterality: Left  Location/Site: L4  Needle:5.0 in., 22 ga.  Short bevel or Quincke spinal needle  Needle Placement: Transforaminal  Findings:    -Comments: Excellent flow of contrast along the nerve, nerve root and into the epidural space.  Procedure Details: After squaring off the end-plates to get a true AP view, the C-arm was positioned so that an oblique view of the foramen as noted above was visualized. The target area is just inferior to the "nose of the scotty dog" or sub pedicular. The soft tissues overlying this structure were infiltrated with 2-3 ml. of 1% Lidocaine without Epinephrine.  The spinal needle was inserted toward the target using a "trajectory" view along the fluoroscope beam.  Under AP and lateral visualization, the needle was advanced so it did not puncture dura and was located close the 6 O'Clock position of the pedical in AP tracterory. Biplanar projections were used to confirm position. Aspiration was confirmed to be negative for CSF and/or blood. A 1-2 ml. volume of Isovue-250 was injected and flow of contrast was noted at each level. Radiographs were obtained for documentation purposes.   After attaining the desired flow of contrast documented above, a 0.5 to 1.0 ml test dose of 0.25% Marcaine was injected into each respective transforaminal space.  The patient was observed for 90 seconds post injection.  After no sensory deficits were reported, and normal lower extremity motor function was noted,   the above injectate  was administered so that equal amounts of the injectate were placed at each foramen (level) into the transforaminal epidural space.   Additional Comments:  No complications occurred Dressing: 2 x 2 sterile gauze and Band-Aid    Post-procedure details: Patient was observed during the procedure. Post-procedure  instructions were reviewed.  Patient left the clinic in stable condition.    Clinical History: MRI LUMBAR SPINE WITHOUT CONTRAST   TECHNIQUE: Multiplanar, multisequence MR imaging of the lumbar spine was performed. No intravenous contrast was administered.   COMPARISON:  08/02/2019 lumbar spine radiographs   FINDINGS: Segmentation:  Standard.   Alignment: Mild lumbar levoscoliosis. 3 mm anterolisthesis of L4 on L5.   Vertebrae: No fracture or suspicious osseous lesion. Mild degenerative endplate edema at W09-W1.   Conus medullaris and cauda equina: Conus extends to the L1-2 level. Conus and cauda equina appear normal.   Paraspinal and other soft tissues: Unremarkable.   Disc levels:   Disc desiccation throughout the lumbar and lower thoracic spine. Mild disc space narrowing at T12-L1, L4-5, and L5-S1.   T11-12: Only imaged sagittally. Mild disc bulging and moderate facet arthrosis without evidence of significant stenosis.   T12-L1: Disc bulging mildly eccentric to the left and mild right and moderate left facet arthrosis without stenosis.   L1-2: Minimal disc bulging and mild facet and ligamentum flavum hypertrophy without stenosis.   L2-3: Mild disc bulging and mild facet and ligamentum flavum hypertrophy without stenosis.   L3-4: Mild disc bulging and moderate facet and ligamentum flavum hypertrophy without stenosis.   L4-5: Anterolisthesis with slight bulging of uncovered disc and severe facet and ligamentum flavum hypertrophy result in moderate to severe spinal stenosis without neural foraminal stenosis.   L5-S1: Disc bulging, endplate spurring, disc space height loss, and moderate left greater than right facet hypertrophy result in mild bilateral neural foraminal stenosis without spinal stenosis.   IMPRESSION: 1. Severe L4-5 facet arthrosis with grade 1 anterolisthesis and moderate to severe spinal stenosis. 2. Mild bilateral neural foraminal stenosis  at L5-S1.     Electronically Signed   By: Sebastian Ache M.D.   On: 05/01/2020 10:25     Objective:  VS:  HT:    WT:   BMI:     BP:   HR: bpm  TEMP: ( )  RESP:  Physical Exam Vitals and nursing note reviewed.  Constitutional:      General: She is not in acute distress.    Appearance: Normal appearance. She is not ill-appearing.  HENT:     Head: Normocephalic and atraumatic.     Right Ear: External ear normal.     Left Ear: External ear normal.  Eyes:     Extraocular Movements: Extraocular movements intact.  Cardiovascular:     Rate and Rhythm: Normal rate.     Pulses: Normal pulses.  Pulmonary:     Effort: Pulmonary effort is normal. No respiratory distress.  Abdominal:     General: There is no distension.     Palpations: Abdomen is soft.  Musculoskeletal:        General: Tenderness present.     Cervical back: Neck supple.     Right lower leg: No edema.     Left lower leg: No edema.     Comments: Patient has good distal strength with no pain over the greater trochanters.  No clonus or focal weakness.  Skin:    Findings: No erythema, lesion or rash.  Neurological:     General: No focal  deficit present.     Mental Status: She is alert and oriented to person, place, and time.     Sensory: No sensory deficit.     Motor: No weakness or abnormal muscle tone.     Coordination: Coordination normal.  Psychiatric:        Mood and Affect: Mood normal.        Behavior: Behavior normal.      Imaging: No results found.
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
## 2023-03-08 ENCOUNTER — Other Ambulatory Visit: Payer: Self-pay | Admitting: Family Medicine

## 2023-03-08 ENCOUNTER — Telehealth: Payer: Self-pay

## 2023-03-08 ENCOUNTER — Other Ambulatory Visit: Payer: Self-pay | Admitting: Nurse Practitioner

## 2023-03-08 ENCOUNTER — Telehealth: Payer: Self-pay | Admitting: Family Medicine

## 2023-03-08 DIAGNOSIS — I10 Essential (primary) hypertension: Secondary | ICD-10-CM

## 2023-03-08 MED ORDER — APIXABAN 5 MG PO TABS: 5.0000 mg | ORAL_TABLET | Freq: Two times a day (BID) | ORAL | 0 refills | Status: DC

## 2023-03-08 NOTE — Telephone Encounter (Signed)
Copied from CRM [id number]. Topic: Clinical - Medication Question >> Mar 08, 2023  4:02 PM Almira Coaster wrote: Reason for CRM: Patient is calling to advice that the apixaban (ELIQUIS) 5 MG TABS tablet requires an authorization through [facility], patient states they will be faxing over a form to the office.

## 2023-03-08 NOTE — Telephone Encounter (Signed)
Copied from CRM [id number]. Topic: General - Other >> Mar 08, 2023  9:36 AM Larwance Sachs wrote: Reason for CRM: Patient called in regarding needing surgical clearance signed by Bethanie Dicker for hip surgery with Rise Paganini, MD, please update patient via mychart once completed

## 2023-03-08 NOTE — Telephone Encounter (Addendum)
I have not received any surgical clearance paperwork.  The surgeon should fax this to [place] to review.

## 2023-03-08 NOTE — Telephone Encounter (Signed)
Spoke with patient to follow up on Surgical Clearance request. Patient is scheduled for an appointment with Dr Birdie Sons on Friday at 1000 AM. Office has not received the Surgical clearance forms yet.

## 2023-03-09 ENCOUNTER — Telehealth (HOSPITAL_COMMUNITY): Payer: Self-pay | Admitting: Pharmacy Technician

## 2023-03-09 ENCOUNTER — Other Ambulatory Visit (HOSPITAL_COMMUNITY): Payer: Self-pay

## 2023-03-09 NOTE — Telephone Encounter (Signed)
ERROR

## 2023-03-10 ENCOUNTER — Ambulatory Visit: Payer: Medicare PPO | Admitting: Surgery

## 2023-03-11 ENCOUNTER — Ambulatory Visit: Payer: Medicare PPO | Admitting: Family Medicine

## 2023-03-11 ENCOUNTER — Encounter: Payer: Self-pay | Admitting: Family Medicine

## 2023-03-11 ENCOUNTER — Other Ambulatory Visit: Payer: Self-pay | Admitting: Family Medicine

## 2023-03-11 ENCOUNTER — Telehealth: Payer: Self-pay | Admitting: Family Medicine

## 2023-03-11 VITALS — BP 104/82 | HR 74 | Temp 98.7°F | Wt 168.0 lb

## 2023-03-11 DIAGNOSIS — L989 Disorder of the skin and subcutaneous tissue, unspecified: Secondary | ICD-10-CM | POA: Diagnosis not present

## 2023-03-11 DIAGNOSIS — I480 Paroxysmal atrial fibrillation: Secondary | ICD-10-CM | POA: Diagnosis not present

## 2023-03-11 DIAGNOSIS — R7303 Prediabetes: Secondary | ICD-10-CM

## 2023-03-11 DIAGNOSIS — K5900 Constipation, unspecified: Secondary | ICD-10-CM | POA: Diagnosis not present

## 2023-03-11 DIAGNOSIS — F17200 Nicotine dependence, unspecified, uncomplicated: Secondary | ICD-10-CM | POA: Diagnosis not present

## 2023-03-11 DIAGNOSIS — Z01818 Encounter for other preprocedural examination: Secondary | ICD-10-CM | POA: Insufficient documentation

## 2023-03-11 DIAGNOSIS — E782 Mixed hyperlipidemia: Secondary | ICD-10-CM

## 2023-03-11 DIAGNOSIS — R1032 Left lower quadrant pain: Secondary | ICD-10-CM

## 2023-03-11 LAB — CBC WITH DIFFERENTIAL/PLATELET
Basophils Absolute: 0.1 10*3/uL (ref 0.0–0.1)
Basophils Relative: 0.8 % (ref 0.0–3.0)
Eosinophils Absolute: 0.1 10*3/uL (ref 0.0–0.7)
Eosinophils Relative: 1.3 % (ref 0.0–5.0)
HCT: 37.8 % (ref 36.0–46.0)
Hemoglobin: 12.8 g/dL (ref 12.0–15.0)
Lymphocytes Relative: 28.7 % (ref 12.0–46.0)
Lymphs Abs: 2.2 10*3/uL (ref 0.7–4.0)
MCHC: 34 g/dL (ref 30.0–36.0)
MCV: 94.2 fL (ref 78.0–100.0)
Monocytes Absolute: 0.5 10*3/uL (ref 0.1–1.0)
Monocytes Relative: 6.8 % (ref 3.0–12.0)
Neutro Abs: 4.7 10*3/uL (ref 1.4–7.7)
Neutrophils Relative %: 62.4 % (ref 43.0–77.0)
Platelets: 174 10*3/uL (ref 150.0–400.0)
RBC: 4.01 Mil/uL (ref 3.87–5.11)
RDW: 13.6 % (ref 11.5–15.5)
WBC: 7.5 10*3/uL (ref 4.0–10.5)

## 2023-03-11 LAB — COMPREHENSIVE METABOLIC PANEL
ALT: 16 U/L (ref 0–35)
AST: 21 U/L (ref 0–37)
Albumin: 4.4 g/dL (ref 3.5–5.2)
Alkaline Phosphatase: 80 U/L (ref 39–117)
BUN: 9 mg/dL (ref 6–23)
CO2: 28 meq/L (ref 19–32)
Calcium: 9.4 mg/dL (ref 8.4–10.5)
Chloride: 102 meq/L (ref 96–112)
Creatinine, Ser: 0.69 mg/dL (ref 0.40–1.20)
GFR: 86.63 mL/min (ref >60.00)
Glucose, Bld: 98 mg/dL (ref 70–99)
Potassium: 4.1 meq/L (ref 3.5–5.1)
Sodium: 138 meq/L (ref 135–145)
Total Bilirubin: 0.6 mg/dL (ref 0.2–1.2)
Total Protein: 7 g/dL (ref 6.0–8.3)

## 2023-03-11 LAB — HEMOGLOBIN A1C: Hgb A1c MFr Bld: 6 % (ref 4.6–6.5)

## 2023-03-11 MED ORDER — APIXABAN 5 MG PO TABS: 5.0000 mg | ORAL_TABLET | Freq: Two times a day (BID) | ORAL | Status: DC

## 2023-03-11 NOTE — Assessment & Plan Note (Addendum)
I encouraged the patient to cover this with a Band-Aid so that her brace does not rub against it.  Discussed using Vaseline on it.  Advised she needs to see her dermatologist for further evaluation if it does not heal up.

## 2023-03-11 NOTE — Telephone Encounter (Signed)
Pre Op form is up front in Dr Jearld Lesch color folder. Form has also been sent to [address].

## 2023-03-11 NOTE — Assessment & Plan Note (Signed)
Seen for preoperative visit today.  Patient has had a cardiology visit and they have performed their risk assessment.  Will get lab work today and then determine her medical risk.  She was encouraged to quit smoking well before her surgery.

## 2023-03-11 NOTE — Progress Notes (Addendum)
Donna Alar, MD Phone: [phone]  Donna Rios is a 73 y.o. female who presents today for f/u.  Preoperative exam: Patient presents for preoperative exam for left hip replacement.  She has seen cardiology for preoperative visit.  They advised that she hold her Eliquis 3 days prior to her surgery.  Patient notes she is out of her Eliquis and needs an authorization for this through the pharmacy.  Continues to smoke though is down to 4 cigarettes a day.  She has a plan to quit before she has her surgery.  Skin lesion: Patient notes this is on her left calf underneath the brace.  Notes this difficulty getting to heal related to friction.  She has been putting Neosporin on it.  Constipation: Patient notes she goes every 2 to 4 days.  It has been about 2 days since she had a bowel movement.  She is been taking MiraLAX once daily.  Notes when she gets constipated this causes some urinary incontinence particularly when she has to bear down to have a bowel movement.  Social History   Tobacco Use  Smoking Status Every Day   Current packs/day: 0.50   Average packs/day: 0.5 packs/day for 54.1 years (27.1 ttl pk-yrs)   Types: Cigarettes   Start date: 1971   Passive exposure: Past  Smokeless Tobacco Never    Current Outpatient Medications on File Prior to Visit  Medication Sig Dispense Refill   acetaminophen (TYLENOL 8 HOUR ARTHRITIS PAIN) 650 MG CR tablet Take 650 mg by mouth every 8 (eight) hours as needed for pain.     apixaban (ELIQUIS) 5 MG TABS tablet Take 1 tablet (5 mg total) by mouth 2 (two) times daily. 180 tablet 0   APPLE CIDER VINEGAR PO Take 1 tablet by mouth daily as needed (with fatty foods).      b complex vitamins tablet Take 1 tablet by mouth 2 (two) times a week.      Calcium-Magnesium-Vitamin D (CALCIUM 1200+D3 PO) Take 1 tablet by mouth daily.     cetirizine (ZYRTEC) 10 MG tablet Take 10 mg by mouth as needed.      Elastic Bandages & Supports (KNEE BRACE/HINGED BARS  MEDIUM) MISC Use daily while awake for knee support. Dx code M25.562, G89.29 1 each 0   fluticasone (FLONASE) 50 MCG/ACT nasal spray Place 1 spray into both nostrils daily as needed for allergies.      Lidocaine 4 % SOLN Apply topically as needed.     losartan (COZAAR) 50 MG tablet TAKE 1 TABLET BY MOUTH EVERY DAY 90 tablet 0   Multiple Vitamin (MULTIVITAMIN WITH MINERALS) TABS tablet Take 1 tablet by mouth daily. Women's Complete Multivitamin 50+     Probiotic Product (PROBIOTIC ADVANCED PO) Take 1 tablet by mouth every other day.     Propylene Glycol (SYSTANE COMPLETE OP) Place 1 drop into both eyes 3 (three) times daily as needed (dry/irritated eyes.).      rosuvastatin (CRESTOR) 40 MG tablet Take 1 tablet (40 mg total) by mouth daily. 90 tablet 3   No current facility-administered medications on file prior to visit.     ROS see history of present illness  Objective  Physical Exam Vitals:   03/11/23 0946 03/11/23 1004  BP: 104/82   Pulse: (!) 114 74  Temp: 98.7 F (37.1 C)   SpO2: 100%     BP Readings from Last 3 Encounters:  03/11/23 104/82  01/24/23 121/67  01/06/23 135/71   Wt Readings from  Last 3 Encounters:  03/11/23 168 lb (76.2 kg)  01/24/23 168 lb 9.6 oz (76.5 kg)  01/06/23 172 lb 12.8 oz (78.4 kg)    Physical Exam Constitutional:      General: She is not in acute distress.    Appearance: She is not diaphoretic.  Cardiovascular:     Rate and Rhythm: Normal rate and regular rhythm.     Heart sounds: Normal heart sounds.  Pulmonary:     Effort: Pulmonary effort is normal.     Breath sounds: Normal breath sounds.  Skin:    General: Skin is warm and dry.       Neurological:     Mental Status: She is alert.      Assessment/Plan: Please see individual problem list.  Preop examination Assessment & Plan: Seen for preoperative visit today.  Patient has had a cardiology visit and they have performed their risk assessment.  Will get lab work today and  then determine her medical risk.  She was encouraged to quit smoking well before her surgery.  Orders: -     CBC with Differential/Platelet -     Comprehensive metabolic panel  Prediabetes -     Hemoglobin A1c  Smoker Assessment & Plan: Chronic issue.  Encouraged smoking cessation.  She will continue to reduce her cigarette intake.   Paroxysmal atrial fibrillation (HCC) Assessment & Plan: Chronic issue.  Sinus rhythm today.  We will give her samples of Eliquis 5 mg twice daily.  She will follow cardiology's instructions on stopping her Eliquis 3 days before surgery.  Refill of Eliquis previously sent to pharmacy.   Constipation, unspecified constipation type Assessment & Plan: Chronic issue.  Reassuring abdominal exam today.  She will try taking MiraLAX 1 capful twice daily.  If that is not beneficial she could try adding in Dulcolax over-the-counter.  Discussed getting her constipation under better control would help with the urinary incontinence.   Skin lesion Assessment & Plan: I encouraged the patient to cover this with a Band-Aid so that her brace does not rub against it.  Discussed using Vaseline on it.  Advised she needs to see her dermatologist for further evaluation if it does not heal up.   Other orders -     Apixaban; Take 1 tablet (5 mg total) by mouth 2 (two) times daily.  Dispense: 28 tablet  Encouraged patient to contact her pharmacy to check on that Eliquis refill.  Return for As scheduled.   Donna Alar, MD [facility]

## 2023-03-11 NOTE — Assessment & Plan Note (Signed)
Chronic issue.  Reassuring abdominal exam today.  She will try taking MiraLAX 1 capful twice daily.  If that is not beneficial she could try adding in Dulcolax over-the-counter.  Discussed getting her constipation under better control would help with the urinary incontinence.

## 2023-03-11 NOTE — Telephone Encounter (Signed)
Form printed from [address]

## 2023-03-11 NOTE — Assessment & Plan Note (Signed)
Chronic issue.  Sinus rhythm today.  We will give her samples of Eliquis 5 mg twice daily.  She will follow cardiology's instructions on stopping her Eliquis 3 days before surgery.  Refill of Eliquis previously sent to pharmacy.

## 2023-03-11 NOTE — Assessment & Plan Note (Signed)
Chronic issue.  Encouraged smoking cessation.  She will continue to reduce her cigarette intake.

## 2023-03-14 ENCOUNTER — Other Ambulatory Visit (HOSPITAL_COMMUNITY): Payer: Self-pay

## 2023-03-14 NOTE — Telephone Encounter (Signed)
Preop form completed and placed in signed folder.

## 2023-03-14 NOTE — Telephone Encounter (Signed)
Can we follow-up with the PA team later this week to make sure they got this? Thanks.

## 2023-03-14 NOTE — Telephone Encounter (Signed)
Preop form faxed to [facility] at [phone] and received an okay confirmation.

## 2023-03-16 ENCOUNTER — Telehealth: Payer: Self-pay

## 2023-03-16 ENCOUNTER — Other Ambulatory Visit (HOSPITAL_COMMUNITY): Payer: Self-pay

## 2023-03-16 NOTE — Telephone Encounter (Signed)
Was unable to start new PA in [facility] due to prior PA initiated by patient. Called [employer] to answer clinical questions regarding PA and urged to expedite. PA has been submitted via phone, pt should receive notification from [place], per [place], within 24 hours.     Pharmacy Patient Advocate Encounter   Received notification from Pt Calls Messages that prior authorization for Eliquis 5mg  is required/requested.   Insurance verification completed.   The patient is insured through [employer] .   Per test claim: PA required; PA submitted to above mentioned insurance via Phone Key/confirmation #/EOC Not given Status is pending

## 2023-03-16 NOTE — Telephone Encounter (Signed)
Was unable to start new PA in [facility] due to prior PA initiated by patient. Called [employer] to answer clinical questions regarding PA and urged to expedite. PA has been submitted via phone, pt should receive notification from [place], per [place], within 24 hours.

## 2023-03-16 NOTE — Telephone Encounter (Signed)
 noted

## 2023-03-18 NOTE — Telephone Encounter (Signed)
PA request has been Approved. New Encounter created for follow up. For additional info see Pharmacy Prior Auth telephone encounter from 03/16/2023.

## 2023-03-18 NOTE — Telephone Encounter (Signed)
Pharmacy Patient Advocate Encounter  Received notification from [facility] that Prior Authorization for Eliquis has been APPROVED from 03/16/2023 to 01/25/2024   PA #/Case ID/Reference #: Not given, please see approval letter in pt's media tab

## 2023-03-21 ENCOUNTER — Telehealth: Payer: Self-pay | Admitting: Orthopedic Surgery

## 2023-03-21 NOTE — Telephone Encounter (Signed)
 Patient wants to know if she would qualifies for the medical marijuana for after surgery.

## 2023-03-22 NOTE — Telephone Encounter (Signed)
 I called patient to advise. She is concerned that she does not do well with opioid medication and feels marijuana will better keep her from hurting, being anxious, and having elevated blood pressure. She would like to know if there is someone Dr. August Saucer could reach out to in order to find out if she qualifies.  Also, she states that she has deteriorated and needs to go to skilled nursing post op for rehab.  She wants to be sure that it states rehab as she does not want to be there and forgotten.  I advised this is something that would be ordered from the hospital post op, but that I would pass message to Dr. August Saucer to let him know what she would like to do.  She also wants to know about pre-op appointments and if she will need to come back in to the office.

## 2023-03-22 NOTE — Telephone Encounter (Signed)
 Really have no idea

## 2023-03-23 NOTE — Telephone Encounter (Signed)
 No/snf most likely but it really depends on how she is doing after surgery/she will not need to come back into the office.  She will however need cardiac risk stratification as well as advisement about preoperative blood thinners.  Thanks

## 2023-03-25 NOTE — Pre-Procedure Instructions (Signed)
 Surgical Instructions   Your procedure is scheduled on Monday, March 10th. Report to [facility] Main Entrance "A" at 05:30 A.M., then check in with the Admitting office. Any questions or running late day of surgery: call [phone]  Questions prior to your surgery date: call [phone], Monday-Friday, 8am-4pm. If you experience any cold or flu symptoms such as cough, fever, chills, shortness of breath, etc. between now and your scheduled surgery, please notify us at the above number.     Remember:  Do not eat after midnight the night before your surgery  You may drink clear liquids until 04:30 AM the morning of your surgery.   Clear liquids allowed are: Water, Non-Citrus Juices (without pulp), Carbonated Beverages, Clear Tea (no milk, honey, etc.), Black Coffee Only (NO MILK, CREAM OR POWDERED CREAMER of any kind), and Gatorade.  Patient Instructions  The night before surgery:  No food after midnight. ONLY clear liquids after midnight  The day of surgery (if you do NOT have diabetes):  Drink ONE (1) Pre-Surgery Clear Ensure by 04:30 AM the morning of surgery. Drink in one sitting. Do not sip.  This drink was given to you during your hospital  pre-op appointment visit.  Nothing else to drink after completing the  Pre-Surgery Clear Ensure.          If you have questions, please contact your surgeon's office.    Take these medicines the morning of surgery with A SIP OF WATER  acetaminophen (TYLENOL 8 HOUR ARTHRITIS PAIN)  rosuvastatin (CRESTOR)   May take these medicines IF NEEDED: ALPRAZolam (XANAX PO)  cetirizine (ZYRTEC)  fluticasone (FLONASE)  Propylene Glycol (SYSTANE COMPLETE OP) eye drops   Hold Eliquis 3 days prior to surgery. Last dose 3/6.  One week prior to surgery, STOP taking any Aspirin (unless otherwise instructed by your surgeon) Aleve, Naproxen, Ibuprofen, Motrin, Advil, Goody's, BC's, all herbal medications, fish oil, and non-prescription vitamins.                      Do NOT Smoke (Tobacco/Vaping) for 24 hours prior to your procedure.  If you use a CPAP at night, you may bring your mask/headgear for your overnight stay.   You will be asked to remove any contacts, glasses, piercing's, hearing aid's, dentures/partials prior to surgery. Please bring cases for these items if needed.    Patients discharged the day of surgery will not be allowed to drive home, and someone needs to stay with them for 24 hours.  SURGICAL WAITING ROOM VISITATION Patients may have no more than 2 support people in the waiting area - these visitors may rotate.   Pre-op nurse will coordinate an appropriate time for 1 ADULT support person, who may not rotate, to accompany patient in pre-op.  Children under the age of 74 must have an adult with them who is not the patient and must remain in the main waiting area with an adult.  If the patient needs to stay at the hospital during part of their recovery, the visitor guidelines for inpatient rooms apply.  Please refer to the [facility] website for the visitor guidelines for any additional information.   If you received a COVID test during your pre-op visit  it is requested that you wear a mask when out in public, stay away from anyone that may not be feeling well and notify your surgeon if you develop symptoms. If you have been in contact with anyone that has tested positive in  the last 10 days please notify you surgeon.      Pre-operative 5 CHG Bathing Instructions   You can play a key role in reducing the risk of infection after surgery. Your skin needs to be as free of germs as possible. You can reduce the number of germs on your skin by washing with CHG (chlorhexidine gluconate) soap before surgery. CHG is an antiseptic soap that kills germs and continues to kill germs even after washing.   DO NOT use if you have an allergy to chlorhexidine/CHG or antibacterial soaps. If your skin becomes reddened or irritated,  stop using the CHG and notify one of our RNs at [phone].   Please shower with the CHG soap starting 4 days before surgery using the following schedule:     Please keep in mind the following:  DO NOT shave, including legs and underarms, starting the day of your first shower.   You may shave your face at any point before/day of surgery.  Place clean sheets on your bed the day you start using CHG soap. Use a clean washcloth (not used since being washed) for each shower. DO NOT sleep with pets once you start using the CHG.   CHG Shower Instructions:  Wash your face and private area with normal soap. If you choose to wash your hair, wash first with your normal shampoo.  After you use shampoo/soap, rinse your hair and body thoroughly to remove shampoo/soap residue.  Turn the water OFF and apply about 3 tablespoons (45 ml) of CHG soap to a CLEAN washcloth.  Apply CHG soap ONLY FROM YOUR NECK DOWN TO YOUR TOES (washing for 3-5 minutes)  DO NOT use CHG soap on face, private areas, open wounds, or sores.  Pay special attention to the area where your surgery is being performed.  If you are having back surgery, having someone wash your back for you may be helpful. Wait 2 minutes after CHG soap is applied, then you may rinse off the CHG soap.  Pat dry with a clean towel  Put on clean clothes/pajamas   If you choose to wear lotion, please use ONLY the CHG-compatible lotions that are listed below.  Additional instructions for the day of surgery: DO NOT APPLY any lotions, deodorants, cologne, or perfumes.   Do not bring valuables to the hospital. [facility] is not responsible for any belongings/valuables. Do not wear nail polish, gel polish, artificial nails, or any other type of covering on natural nails (fingers and toes) Do not wear jewelry or makeup Put on clean/comfortable clothes.  Please brush your teeth.  Ask your nurse before applying any prescription medications to the  skin.     CHG Compatible Lotions   Aveeno Moisturizing lotion  Cetaphil Moisturizing Cream  Cetaphil Moisturizing Lotion  Clairol Herbal Essence Moisturizing Lotion, Dry Skin  Clairol Herbal Essence Moisturizing Lotion, Extra Dry Skin  Clairol Herbal Essence Moisturizing Lotion, Normal Skin  Curel Age Defying Therapeutic Moisturizing Lotion with Alpha Hydroxy  Curel Extreme Care Body Lotion  Curel Soothing Hands Moisturizing Hand Lotion  Curel Therapeutic Moisturizing Cream, Fragrance-Free  Curel Therapeutic Moisturizing Lotion, Fragrance-Free  Curel Therapeutic Moisturizing Lotion, Original Formula  Eucerin Daily Replenishing Lotion  Eucerin Dry Skin Therapy Plus Alpha Hydroxy Crme  Eucerin Dry Skin Therapy Plus Alpha Hydroxy Lotion  Eucerin Original Crme  Eucerin Original Lotion  Eucerin Plus Crme Eucerin Plus Lotion  Eucerin TriLipid Replenishing Lotion  Keri Anti-Bacterial Hand Lotion  Keri Deep Conditioning Original Lotion Dry  Skin Formula Softly Scented  Keri Deep Conditioning Original Lotion, Fragrance Free Sensitive Skin Formula  Keri Lotion Fast Absorbing Fragrance Free Sensitive Skin Formula  Keri Lotion Fast Absorbing Softly Scented Dry Skin Formula  Keri Original Lotion  Keri Skin Renewal Lotion Keri Silky Smooth Lotion  Keri Silky Smooth Sensitive Skin Lotion  Nivea Body Creamy Conditioning Oil  Nivea Body Extra Enriched Lotion  Nivea Body Original Lotion  Nivea Body Sheer Moisturizing Lotion Nivea Crme  Nivea Skin Firming Lotion  NutraDerm 30 Skin Lotion  NutraDerm Skin Lotion  NutraDerm Therapeutic Skin Cream  NutraDerm Therapeutic Skin Lotion  ProShield Protective Hand Cream  Provon moisturizing lotion  Please read over the following fact sheets that you were given.

## 2023-03-28 ENCOUNTER — Other Ambulatory Visit: Payer: Self-pay

## 2023-03-28 ENCOUNTER — Telehealth: Payer: Self-pay

## 2023-03-28 ENCOUNTER — Encounter (HOSPITAL_COMMUNITY)
Admission: RE | Admit: 2023-03-28 | Discharge: 2023-03-28 | Disposition: A | Discharge status: Home / Self Care | Payer: Medicare PPO | Source: Ambulatory Visit | Attending: Orthopedic Surgery | Admitting: Orthopedic Surgery

## 2023-03-28 ENCOUNTER — Encounter (HOSPITAL_COMMUNITY): Payer: Self-pay

## 2023-03-28 VITALS — BP 133/78 | HR 70 | Temp 98.3°F | Resp 17 | Ht 69.0 in | Wt 166.4 lb

## 2023-03-28 DIAGNOSIS — Z952 Presence of prosthetic heart valve: Secondary | ICD-10-CM | POA: Insufficient documentation

## 2023-03-28 DIAGNOSIS — Z7901 Long term (current) use of anticoagulants: Secondary | ICD-10-CM | POA: Diagnosis not present

## 2023-03-28 DIAGNOSIS — M1612 Unilateral primary osteoarthritis, left hip: Secondary | ICD-10-CM | POA: Insufficient documentation

## 2023-03-28 DIAGNOSIS — D649 Anemia, unspecified: Secondary | ICD-10-CM | POA: Insufficient documentation

## 2023-03-28 DIAGNOSIS — J449 Chronic obstructive pulmonary disease, unspecified: Secondary | ICD-10-CM | POA: Insufficient documentation

## 2023-03-28 DIAGNOSIS — K760 Fatty (change of) liver, not elsewhere classified: Secondary | ICD-10-CM | POA: Insufficient documentation

## 2023-03-28 DIAGNOSIS — I480 Paroxysmal atrial fibrillation: Secondary | ICD-10-CM | POA: Diagnosis not present

## 2023-03-28 DIAGNOSIS — I10 Essential (primary) hypertension: Secondary | ICD-10-CM | POA: Insufficient documentation

## 2023-03-28 DIAGNOSIS — F419 Anxiety disorder, unspecified: Secondary | ICD-10-CM | POA: Diagnosis not present

## 2023-03-28 DIAGNOSIS — Z01812 Encounter for preprocedural laboratory examination: Secondary | ICD-10-CM | POA: Insufficient documentation

## 2023-03-28 DIAGNOSIS — Z01818 Encounter for other preprocedural examination: Secondary | ICD-10-CM

## 2023-03-28 HISTORY — DX: Anxiety disorder, unspecified: F41.9

## 2023-03-28 HISTORY — DX: Unilateral inguinal hernia, without obstruction or gangrene, not specified as recurrent: K40.90

## 2023-03-28 HISTORY — DX: Chronic obstructive pulmonary disease, unspecified: J44.9

## 2023-03-28 HISTORY — DX: Anemia, unspecified: D64.9

## 2023-03-28 LAB — URINALYSIS, W/ REFLEX TO CULTURE (INFECTION SUSPECTED)
Bilirubin Urine: NEGATIVE
Glucose, UA: NEGATIVE mg/dL
Ketones, ur: NEGATIVE mg/dL
Nitrite: NEGATIVE
Protein, ur: NEGATIVE mg/dL
Specific Gravity, Urine: 1.01 (ref 1.005–1.030)
pH: 6 (ref 5.0–8.0)

## 2023-03-28 LAB — SURGICAL PCR SCREEN
MRSA, PCR: NEGATIVE
Staphylococcus aureus: NEGATIVE

## 2023-03-28 LAB — TYPE AND SCREEN
ABO/RH(D): A POS
Antibody Screen: NEGATIVE

## 2023-03-28 NOTE — Telephone Encounter (Signed)
 Spoke with patient to clarify which approval she was calling in regards to. Patient says she wanted to follow up with Eliquis prescription. Patient was informed that the prior authorization was approval with her [facility] and good through to December of 2025. Patient verbalized understanding and has no further questions at this time.

## 2023-03-28 NOTE — Progress Notes (Signed)
 [name], [place], notified of abnormal UA results. No new orders

## 2023-03-28 NOTE — Progress Notes (Signed)
 PCP - Dr. Marikay Alar Cardiologist - Dr. Mariah Milling  PPM/ICD - denies   Chest x-ray - 07/11/18 EKG - 06/07/22 Stress Test - denies ECHO - 07/26/22 Cardiac Cath - 06/15/18  Sleep Study - denies   DM- prediabetic, per pt, no meds  Last dose of GLP1 agonist-  n/a   Blood Thinner Instructions: Hold Eliquis 3 days. Pt knows to take last dose 3/6 Aspirin Instructions: n/a  ERAS Protcol - yes PRE-SURGERY Ensure given at PAT  COVID TEST- n/a   Anesthesia review: yes, cardiac hx  Patient denies shortness of breath, fever, cough and chest pain at PAT appointment   All instructions explained to the patient, with a verbal understanding of the material. Patient agrees to go over the instructions while at home for a better understanding.  The opportunity to ask questions was provided.

## 2023-03-28 NOTE — Telephone Encounter (Signed)
 Copied from CRM [id number]. Topic: Clinical - Prescription Issue >> Mar 25, 2023  4:56 PM [name] wrote: Reason for CRM: pt called to find out if the provider received the approval from her insurance company. Please call pt back at [phone]

## 2023-03-29 ENCOUNTER — Encounter (HOSPITAL_COMMUNITY): Payer: Self-pay

## 2023-03-29 NOTE — Progress Notes (Signed)
 Anesthesia Chart Review:  Case: [id number] Date/Time: 04/04/23 0715   Procedure: LEFT TOTAL HIP ARTHROPLASTY ANTERIOR APPROACH (Left: Hip)   Anesthesia type: Spinal   Pre-op diagnosis: left hip osteoarthritis   [facility] ROOM 08 / [facility]   Surgeons: Cammy Copa, MD       DISCUSSION: Patient is a 73 year old female scheduled for the above procedure.  History includes smoking, COPD, HTN, severe aortic stenosis (s/p TAVR 07/11/18), dysrhythmia (PAF 07/2018 Zio monitor), HTN, fatty liver, varicose veins (s/p surgery), anemia, anxiety. Normal coronaries in 2020.   Preoperative cardiology telephonic evaluation on on 02/17/23 by Robin Searing, NP : "-Patient's RCRI score is 6.6%   The patient affirms she has been doing well without any new cardiac symptoms. They are able to achieve 5 METS without cardiac limitations. Therefore, based on ACC/AHA guidelines, the patient would be at acceptable risk for the planned procedure without further cardiovascular testing. The patient was advised that if she develops new symptoms prior to surgery to contact our office to arrange for a follow-up visit, and she verbalized understanding... Per office protocol, patient can hold Eliquis for 3 days prior to procedure."  Last Eliquis planned for 03/31/22.   She also had medical input per Dr. Birdie Sons and classified as "low-medical risk" (scanned under Media tab).   She had a normal CBC and CMP as part of 03/11/23 PCP labsAnesthesia team to evaluate on the day of surgery.   VS: BP 133/78   Pulse 70   Temp 36.8 C   Resp 17   Ht 5\' 9"  (1.753 m)   Wt 75.5 kg   SpO2 99%   BMI 24.57 kg/m    PROVIDERS: Glori Luis, MD (Inactive) Julien Nordmann, MD is cardiologist   LABS: She had CBC and CMP as part of 03/11/23 PCP labs. Results include: Lab Results  Component Value Date   WBC 7.5 03/11/2023   HGB 12.8 03/11/2023   HCT 37.8 03/11/2023   PLT 174.0 03/11/2023   GLUCOSE 98 03/11/2023   CHOL  134 10/25/2022   TRIG 80.0 10/25/2022   HDL 66.20 10/25/2022   LDLDIRECT 71.0 09/18/2019   LDLCALC 52 10/25/2022   ALT 16 03/11/2023   AST 21 03/11/2023   NA 138 03/11/2023   K 4.1 03/11/2023   CL 102 03/11/2023   CREATININE 0.69 03/11/2023   BUN 9 03/11/2023   CO2 28 03/11/2023   TSH 0.81 10/16/2018   INR 1.0 07/07/2018   HGBA1C 6.0 03/11/2023   PAT labs are listed, but only abnormal results are displayed). Magnant, Charles, PA_C has marked UA as read.  Labs Reviewed  URINALYSIS, W/ REFLEX TO CULTURE (INFECTION SUSPECTED) - Abnormal; Notable for the following components:      Result Value   Hgb urine dipstick TRACE (*)    Leukocytes,Ua MODERATE (*)    Bacteria, UA RARE (*)    All other components within normal limits  SURGICAL PCR SCREEN  TYPE AND SCREEN     IMAGES: CT Chest LCS 12/07/22: IMPRESSION: 1. Lung-RADS 2, benign appearance or behavior. Continue annual screening with low-dose chest CT without contrast in 12 months. 2. Aortic atherosclerosis. 3. Mild diffuse bronchial wall thickening with mild centrilobular and paraseptal emphysema; imaging findings suggestive of underlying COPD. - Aortic Atherosclerosis (ICD10-I70.0) and Emphysema (ICD10-J43.9).    EKG: 06/07/22: NSR. Minimal voltage criteria for LVH, may be normal variant. Non-specific ST  and T wave abnormality. Baseline noise/artifact.    CV: Echo 07/26/22:  IMPRESSIONS   1. Left ventricular ejection fraction, by estimation, is 55 to 60%. The  left ventricle has normal function. The left ventricle has no regional  wall motion abnormalities. There is mild left ventricular hypertrophy.  Left ventricular diastolic parameters  were normal.   2. Right ventricular systolic function is normal. The right ventricular  size is mildly enlarged.   3. The mitral valve is normal in structure. No evidence of mitral valve  regurgitation.   4. The aortic valve has been repaired/replaced. Aortic valve  regurgitation  is not visualized. There is a 26 mm Sapien prosthetic (TAVR)  valve present in the aortic position. Procedure Date: 07/11/18. Aortic  valve mean gradient measures 10.0 mmHg.    Long term Zio AT monitor 07/29/18 - 08/12/18: - Normal sinus rhythm. Avg HR of 77 bpm.  - 1 run of Supraventricular Tachycardia occurred lasting 8 beats with a max rate of 185 bpm (avg 161 bpm), unable to exclude atrial fibrillation.  - Atrial Fibrillation occurred (<1% burden), ranging from 106-147 bpm (avg of 126 bpm), the longest lasting 38 secs with an avg rate of 126 bpm. Isolated SVEs were rare (<1.0%), SVE Couplets were rare (<1.0%), and SVE Triplets were rare (<1.0%). Isolated VEs were rare (<1.0%), VE Couplets were rare (<1.0%), and no VE Triplets were present. - Patient triggered events were not associated with significant arrhythmia.   CT coronary (Pre-TAVR) 06/21/18: IMPRESSION: 1. Trileaflet aortic valve with severely calcified and thickened leaflets and moderately restricted leaflet openings. There are no calcifications extending into the LVOT. Annular measurements are suitable for delivery of a 26 mm Edwards-SAPIEN 3 valve. 2. Aortic valve calcium score is 1759 consistent with severe aortic stenosis. 3. Sufficient coronary to annulus distance. 4. Optimum Fluoroscopic Angle for Delivery: RAO 0 CRA 0. 5. No thrombus in the left atrial appendage.    Carotid US 06/21/18: - Mild technical difficulty due to high bifurcation and tortuosity Summary: - Right Carotid: Velocities in the right ICA are consistent with a 1-39% stenosis. - Left Carotid: Velocities in the left ICA are consistent with a 1-39% stenosis. - Vertebrals:  Bilateral vertebral arteries demonstrate antegrade flow. - Subclavians: Normal flow hemodynamics were seen in bilateral subclavian arteries.    Cardiac cath (Pre-TAVR) 06/15/18: Conclusion: 1. Known severe aortic stenosis by echo (mean gradient 60 mmHg) with heavily calcified and  restricted aortic valve leaflets by fluoroscopy 2. Widely patent coronary arteries with no obstructive CAD 3. Normal right heart pressures with high cardiac output noted Plan: continue multidisciplinary evaluation for treatment of severe aortic stenosis     Past Medical History:  Diagnosis Date   Allergic rhinitis    Anemia    hx of   Anxiety    Arthritis    COPD (chronic obstructive pulmonary disease) (HCC)    Dysrhythmia    PAF 07/2018 Zio monitor   Fatty liver    Hypertension    Inguinal hernia, left    Phlebitis    S/P TAVR (transcatheter aortic valve replacement)    Severe aortic stenosis    Stress incontinence    Tobacco abuse     Past Surgical History:  Procedure Laterality Date   ABDOMINAL HYSTERECTOMY     BREAST BIOPSY Left    ducts removed   Cataract surgery     COLONOSCOPY WITH PROPOFOL N/A 09/29/2017   Procedure: COLONOSCOPY WITH PROPOFOL;  Surgeon: Toney Reil, MD;  Location: [facility] ENDOSCOPY;  Service: Gastroenterology;  Laterality: N/A;   EYE SURGERY  FOOT SURGERY Left    7   INTRAOPERATIVE TRANSTHORACIC ECHOCARDIOGRAM N/A 07/11/2018   Procedure: Intraoperative Transthoracic Echocardiogram;  Surgeon: Tonny Bollman, MD;  Location: [facility] OR;  Service: Open Heart Surgery;  Laterality: N/A;   KNEE ARTHROSCOPY Right    LAPAROSCOPIC HYSTERECTOMY     RIGHT HEART CATH AND CORONARY ANGIOGRAPHY N/A 06/15/2018   Procedure: RIGHT HEART CATH AND CORONARY ANGIOGRAPHY;  Surgeon: Tonny Bollman, MD;  Location: [facility] INVASIVE CV LAB;  Service: Cardiovascular;  Laterality: N/A;   TOOTH EXTRACTION  02/15/2023   TRANSCATHETER AORTIC VALVE REPLACEMENT, TRANSFEMORAL  07/11/2018   TRANSCATHETER AORTIC VALVE REPLACEMENT, TRANSFEMORAL N/A 07/11/2018   Procedure: TRANSCATHETER AORTIC VALVE REPLACEMENT, TRANSFEMORAL;  Surgeon: Tonny Bollman, MD;  Location: [facility] OR;  Service: Open Heart Surgery;  Laterality: N/A;   VARICOSE VEIN SURGERY Right     MEDICATIONS:   acetaminophen (TYLENOL 8 HOUR ARTHRITIS PAIN) 650 MG CR tablet   ALPRAZolam (XANAX PO)   amoxicillin (AMOXIL) 500 MG capsule   apixaban (ELIQUIS) 5 MG TABS tablet   Apple Cider Vinegar 500 MG TABS   Calcium-Magnesium-Vitamin D (CALCIUM 1200+D3 PO)   cetirizine (ZYRTEC) 10 MG tablet   Elastic Bandages & Supports (KNEE BRACE/HINGED BARS MEDIUM) MISC   ELIQUIS 5 MG TABS tablet   fluticasone (FLONASE) 50 MCG/ACT nasal spray   Lidocaine 4 % SOLN   losartan (COZAAR) 50 MG tablet   Multiple Vitamin (MULTIVITAMIN WITH MINERALS) TABS tablet   polyethylene glycol (MIRALAX / GLYCOLAX) 17 g packet   Probiotic Product (PROBIOTIC ADVANCED PO)   Propylene Glycol (SYSTANE COMPLETE OP)   rosuvastatin (CRESTOR) 40 MG tablet   No current facility-administered medications for this encounter.   She is not currently on amoxicillin.    Shonna Chock, PA-C Surgical Short Stay/Anesthesiology [facility] Phone [phone] [facility] Phone [phone] 03/29/2023 3:17 PM

## 2023-03-29 NOTE — Anesthesia Preprocedure Evaluation (Addendum)
 Anesthesia Evaluation  Patient identified by MRN, date of birth, ID band Patient awake    Reviewed: Allergy & Precautions, NPO status , Patient's Chart, lab work & pertinent test results  Airway Mallampati: II  TM Distance: >3 FB Neck ROM: Full    Dental no notable dental hx.    Pulmonary COPD, Current Smoker and Patient abstained from smoking.   Pulmonary exam normal        Cardiovascular hypertension, Pt. on medications Normal cardiovascular exam  ECHO:  1. Left ventricular ejection fraction, by estimation, is 55 to 60%. The  left ventricle has normal function. The left ventricle has no regional  wall motion abnormalities. There is mild left ventricular hypertrophy.  Left ventricular diastolic parameters  were normal.   2. Right ventricular systolic function is normal. The right ventricular  size is mildly enlarged.   3. The mitral valve is normal in structure. No evidence of mitral valve  regurgitation.   4. The aortic valve has been repaired/replaced. Aortic valve  regurgitation is not visualized. There is a 26 mm Sapien prosthetic (TAVR)  valve present in the aortic position. Procedure Date: 07/11/18. Aortic  valve mean gradient measures 10.0 mmHg.     Neuro/Psych  Headaches PSYCHIATRIC DISORDERS Anxiety Depression       GI/Hepatic negative GI ROS, Neg liver ROS,,,  Endo/Other  negative endocrine ROS    Renal/GU negative Renal ROS     Musculoskeletal  (+) Arthritis ,  Wheelchair prn   Abdominal   Peds  Hematology  (+) Blood dyscrasia (Eliquis)   Anesthesia Other Findings left hip osteoarthritis  Reproductive/Obstetrics                             Anesthesia Physical Anesthesia Plan  ASA: 3  Anesthesia Plan: Spinal   Post-op Pain Management:    Induction: Intravenous  PONV Risk Score and Plan: 1 and Ondansetron, Dexamethasone, Propofol infusion, Midazolam and Treatment may  vary due to age or medical condition  Airway Management Planned: Simple Face Mask  Additional Equipment:   Intra-op Plan:   Post-operative Plan:   Informed Consent: I have reviewed the patients History and Physical, chart, labs and discussed the procedure including the risks, benefits and alternatives for the proposed anesthesia with the patient or authorized representative who has indicated his/her understanding and acceptance.     Dental advisory given  Plan Discussed with: CRNA  Anesthesia Plan Comments: (PAT note written 03/29/2023 by Shonna Chock, PA-C.  )       Anesthesia Quick Evaluation

## 2023-04-03 ENCOUNTER — Telehealth: Payer: Self-pay | Admitting: *Deleted

## 2023-04-03 NOTE — Care Plan (Signed)
 [facility] RNCM call to patient to discuss her upcoming Left total hip arthroplasty with Dr. August Saucer on 04/04/23 at [facility]. She lives with her husband and is agreeable to case management. She mentioned going to SNF for rehab, but we discussed that she may feel after surgery she is doing well enough to go home. Anticipate d/c home after staying in hospital short time and since she does have help at home. She has a home ramp, walker, cane already. Referral made to [facility] for HHPT after short hospital stay. Reviewed post op care instructions. Will continue to follow for needs.

## 2023-04-03 NOTE — Telephone Encounter (Signed)
 [facility] pre-op call completed.

## 2023-04-04 ENCOUNTER — Other Ambulatory Visit: Payer: Self-pay

## 2023-04-04 ENCOUNTER — Observation Stay (HOSPITAL_COMMUNITY)

## 2023-04-04 ENCOUNTER — Observation Stay (HOSPITAL_COMMUNITY)
Admission: RE | Admit: 2023-04-04 | Discharge: 2023-04-08 | Disposition: A | Discharge status: Skilled Nursing Facility | Payer: Medicare PPO | Attending: Orthopedic Surgery | Admitting: Orthopedic Surgery

## 2023-04-04 ENCOUNTER — Encounter (HOSPITAL_COMMUNITY): Payer: Self-pay | Admitting: Orthopedic Surgery

## 2023-04-04 ENCOUNTER — Encounter (HOSPITAL_COMMUNITY)
Admission: RE | Disposition: A | Discharge status: Skilled Nursing Facility | Payer: Self-pay | Source: Home / Self Care | Attending: Orthopedic Surgery

## 2023-04-04 ENCOUNTER — Observation Stay (HOSPITAL_COMMUNITY): Payer: Self-pay | Admitting: Vascular Surgery

## 2023-04-04 DIAGNOSIS — I10 Essential (primary) hypertension: Secondary | ICD-10-CM | POA: Diagnosis not present

## 2023-04-04 DIAGNOSIS — Z85828 Personal history of other malignant neoplasm of skin: Secondary | ICD-10-CM | POA: Diagnosis not present

## 2023-04-04 DIAGNOSIS — M1612 Unilateral primary osteoarthritis, left hip: Secondary | ICD-10-CM

## 2023-04-04 DIAGNOSIS — J449 Chronic obstructive pulmonary disease, unspecified: Secondary | ICD-10-CM | POA: Insufficient documentation

## 2023-04-04 DIAGNOSIS — F17210 Nicotine dependence, cigarettes, uncomplicated: Secondary | ICD-10-CM

## 2023-04-04 DIAGNOSIS — I480 Paroxysmal atrial fibrillation: Secondary | ICD-10-CM | POA: Insufficient documentation

## 2023-04-04 DIAGNOSIS — Z79899 Other long term (current) drug therapy: Secondary | ICD-10-CM | POA: Insufficient documentation

## 2023-04-04 DIAGNOSIS — Z96642 Presence of left artificial hip joint: Secondary | ICD-10-CM

## 2023-04-04 DIAGNOSIS — Z01818 Encounter for other preprocedural examination: Principal | ICD-10-CM

## 2023-04-04 DIAGNOSIS — M169 Osteoarthritis of hip, unspecified: Secondary | ICD-10-CM | POA: Diagnosis present

## 2023-04-04 HISTORY — PX: TOTAL HIP ARTHROPLASTY: SHX124

## 2023-04-04 SURGERY — ARTHROPLASTY, HIP, TOTAL, ANTERIOR APPROACH
Anesthesia: Spinal | Site: Hip | Laterality: Left

## 2023-04-04 MED ORDER — LACTATED RINGERS IV SOLN: INTRAVENOUS | Status: DC

## 2023-04-04 MED ORDER — CHLORHEXIDINE GLUCONATE 0.12 % MT SOLN
15.0000 mL | Freq: Once | OROMUCOSAL | Status: AC
  Administered 2023-04-04: 15 mL via OROMUCOSAL
  Filled 2023-04-04: qty 15

## 2023-04-04 MED ORDER — CEFAZOLIN SODIUM-DEXTROSE 2-4 GM/100ML-% IV SOLN
2.0000 g | INTRAVENOUS | Status: AC
  Administered 2023-04-04: 2 g via INTRAVENOUS
  Filled 2023-04-04: qty 100

## 2023-04-04 MED ORDER — BUPIVACAINE-EPINEPHRINE (PF) 0.25% -1:200000 IJ SOLN
INTRAMUSCULAR | Status: DC | PRN
  Administered 2023-04-04: 20 mL

## 2023-04-04 MED ORDER — VANCOMYCIN HCL 1000 MG IV SOLR
INTRAVENOUS | Status: AC
  Filled 2023-04-04: qty 20

## 2023-04-04 MED ORDER — POVIDONE-IODINE 7.5 % EX SOLN
Freq: Once | CUTANEOUS | Status: AC
  Filled 2023-04-04: qty 118

## 2023-04-04 MED ORDER — DEXAMETHASONE SODIUM PHOSPHATE 10 MG/ML IJ SOLN
INTRAMUSCULAR | Status: DC | PRN
  Administered 2023-04-04: 10 mg via INTRAVENOUS

## 2023-04-04 MED ORDER — ACETAMINOPHEN 325 MG PO TABS
325.0000 mg | ORAL_TABLET | Freq: Four times a day (QID) | ORAL | Status: DC | PRN
  Administered 2023-04-06 – 2023-04-08 (×5): 650 mg via ORAL
  Filled 2023-04-04 (×5): qty 2

## 2023-04-04 MED ORDER — ONDANSETRON HCL 4 MG PO TABS
4.0000 mg | ORAL_TABLET | Freq: Four times a day (QID) | ORAL | Status: DC | PRN
Start: 2023-04-04 — End: 2023-04-08

## 2023-04-04 MED ORDER — VANCOMYCIN HCL 1000 MG IV SOLR
INTRAVENOUS | Status: DC | PRN
  Administered 2023-04-04: 1000 mg

## 2023-04-04 MED ORDER — OXYCODONE HCL 5 MG PO TABS
5.0000 mg | ORAL_TABLET | ORAL | Status: DC | PRN
  Administered 2023-04-04 – 2023-04-05 (×2): 5 mg via ORAL
  Administered 2023-04-05 – 2023-04-06 (×5): 10 mg via ORAL
  Administered 2023-04-06: 5 mg via ORAL
  Administered 2023-04-07 – 2023-04-08 (×4): 10 mg via ORAL
  Filled 2023-04-04 (×2): qty 2
  Filled 2023-04-04: qty 1
  Filled 2023-04-04: qty 2
  Filled 2023-04-04: qty 1
  Filled 2023-04-04 (×9): qty 2

## 2023-04-04 MED ORDER — ACETAMINOPHEN 500 MG PO TABS
1000.0000 mg | ORAL_TABLET | Freq: Once | ORAL | Status: DC
  Filled 2023-04-04: qty 2

## 2023-04-04 MED ORDER — ACETAMINOPHEN 500 MG PO TABS
ORAL_TABLET | ORAL | Status: AC
Start: 2023-04-04 — End: 2023-04-04
  Filled 2023-04-04: qty 2

## 2023-04-04 MED ORDER — ONDANSETRON HCL 4 MG/2ML IJ SOLN: 4.0000 mg | Freq: Once | INTRAMUSCULAR | Status: DC | PRN

## 2023-04-04 MED ORDER — ONDANSETRON HCL 4 MG/2ML IJ SOLN
INTRAMUSCULAR | Status: DC | PRN
  Administered 2023-04-04: 4 mg via INTRAVENOUS

## 2023-04-04 MED ORDER — ORAL CARE MOUTH RINSE: 15.0000 mL | Freq: Once | OROMUCOSAL | Status: DC

## 2023-04-04 MED ORDER — BUPIVACAINE LIPOSOME 1.3 % IJ SUSP
INTRAMUSCULAR | Status: DC | PRN
  Administered 2023-04-04: 20 mL

## 2023-04-04 MED ORDER — AMISULPRIDE (ANTIEMETIC) 5 MG/2ML IV SOLN: 10.0000 mg | Freq: Once | INTRAVENOUS | Status: DC | PRN

## 2023-04-04 MED ORDER — 0.9 % SODIUM CHLORIDE (POUR BTL) OPTIME
TOPICAL | Status: DC | PRN
  Administered 2023-04-04: 2000 mL

## 2023-04-04 MED ORDER — PROPOFOL 10 MG/ML IV BOLUS
INTRAVENOUS | Status: AC
  Filled 2023-04-04: qty 20

## 2023-04-04 MED ORDER — PHENYLEPHRINE 80 MCG/ML (10ML) SYRINGE FOR IV PUSH (FOR BLOOD PRESSURE SUPPORT)
PREFILLED_SYRINGE | INTRAVENOUS | Status: DC | PRN
Start: 2023-04-04 — End: 2023-04-04
  Administered 2023-04-04: 80 ug via INTRAVENOUS
  Administered 2023-04-04: 160 ug via INTRAVENOUS

## 2023-04-04 MED ORDER — CEFAZOLIN SODIUM-DEXTROSE 2-4 GM/100ML-% IV SOLN
2.0000 g | Freq: Three times a day (TID) | INTRAVENOUS | Status: AC
  Administered 2023-04-04 – 2023-04-05 (×2): 2 g via INTRAVENOUS
  Filled 2023-04-04 (×2): qty 100

## 2023-04-04 MED ORDER — DEXAMETHASONE SODIUM PHOSPHATE 10 MG/ML IJ SOLN
INTRAMUSCULAR | Status: AC
  Filled 2023-04-04: qty 1

## 2023-04-04 MED ORDER — MIDAZOLAM HCL 2 MG/2ML IJ SOLN
INTRAMUSCULAR | Status: AC
  Filled 2023-04-04: qty 2

## 2023-04-04 MED ORDER — CLONIDINE HCL (ANALGESIA) 100 MCG/ML EP SOLN
EPIDURAL | Status: AC
  Filled 2023-04-04: qty 10

## 2023-04-04 MED ORDER — POVIDONE-IODINE 10 % EX SWAB: 2.0000 | Freq: Once | CUTANEOUS | Status: DC

## 2023-04-04 MED ORDER — BUPIVACAINE-EPINEPHRINE (PF) 0.25% -1:200000 IJ SOLN
INTRAMUSCULAR | Status: AC
  Filled 2023-04-04: qty 30

## 2023-04-04 MED ORDER — CHLORHEXIDINE GLUCONATE 0.12 % MT SOLN
15.0000 mL | Freq: Once | OROMUCOSAL | Status: DC
Start: 2023-04-04 — End: 2023-04-04

## 2023-04-04 MED ORDER — POLYETHYLENE GLYCOL 3350 17 G PO PACK
17.0000 g | PACK | Freq: Two times a day (BID) | ORAL | Status: DC
  Administered 2023-04-04 – 2023-04-08 (×9): 17 g via ORAL
  Filled 2023-04-04 (×9): qty 1

## 2023-04-04 MED ORDER — SODIUM CHLORIDE 0.9 % IR SOLN
Status: DC | PRN
  Administered 2023-04-04: 1000 mL

## 2023-04-04 MED ORDER — HYDROMORPHONE HCL 1 MG/ML IJ SOLN
0.5000 mg | INTRAMUSCULAR | Status: DC | PRN
  Administered 2023-04-05 – 2023-04-08 (×5): 0.5 mg via INTRAVENOUS
  Filled 2023-04-04 (×5): qty 0.5

## 2023-04-04 MED ORDER — PHENYLEPHRINE HCL-NACL 20-0.9 MG/250ML-% IV SOLN
INTRAVENOUS | Status: DC | PRN
  Administered 2023-04-04: 40 ug/min via INTRAVENOUS

## 2023-04-04 MED ORDER — LORATADINE 10 MG PO TABS
10.0000 mg | ORAL_TABLET | Freq: Every day | ORAL | Status: DC
  Administered 2023-04-08: 10 mg via ORAL
  Filled 2023-04-04 (×3): qty 1

## 2023-04-04 MED ORDER — LACTATED RINGERS IV SOLN
INTRAVENOUS | Status: DC
Start: 2023-04-04 — End: 2023-04-04

## 2023-04-04 MED ORDER — METHOCARBAMOL 500 MG PO TABS
500.0000 mg | ORAL_TABLET | Freq: Four times a day (QID) | ORAL | Status: DC | PRN
  Administered 2023-04-04 – 2023-04-08 (×11): 500 mg via ORAL
  Filled 2023-04-04 (×12): qty 1

## 2023-04-04 MED ORDER — LOSARTAN POTASSIUM 50 MG PO TABS
50.0000 mg | ORAL_TABLET | Freq: Every day | ORAL | Status: DC
  Administered 2023-04-08: 50 mg via ORAL
  Filled 2023-04-04 (×4): qty 1

## 2023-04-04 MED ORDER — METOCLOPRAMIDE HCL 5 MG/ML IJ SOLN: 5.0000 mg | Freq: Three times a day (TID) | INTRAMUSCULAR | Status: DC | PRN

## 2023-04-04 MED ORDER — PROPOFOL 500 MG/50ML IV EMUL
INTRAVENOUS | Status: DC | PRN
  Administered 2023-04-04: 100 ug/kg/min via INTRAVENOUS
  Administered 2023-04-04: 30 mg via INTRAVENOUS
  Administered 2023-04-04: 40 mg via INTRAVENOUS

## 2023-04-04 MED ORDER — MORPHINE SULFATE (PF) 4 MG/ML IV SOLN
INTRAVENOUS | Status: DC | PRN
  Administered 2023-04-04: 13 mL

## 2023-04-04 MED ORDER — ACETAMINOPHEN 500 MG PO TABS
1000.0000 mg | ORAL_TABLET | Freq: Four times a day (QID) | ORAL | Status: AC
  Administered 2023-04-04 – 2023-04-05 (×4): 1000 mg via ORAL
  Filled 2023-04-04 (×3): qty 2

## 2023-04-04 MED ORDER — BUPIVACAINE IN DEXTROSE 0.75-8.25 % IT SOLN
INTRATHECAL | Status: DC | PRN
Start: 2023-04-04 — End: 2023-04-04
  Administered 2023-04-04: 2 mL via INTRATHECAL

## 2023-04-04 MED ORDER — ONDANSETRON HCL 4 MG/2ML IJ SOLN
INTRAMUSCULAR | Status: AC
  Filled 2023-04-04: qty 2

## 2023-04-04 MED ORDER — OXYCODONE HCL 5 MG PO TABS
ORAL_TABLET | ORAL | Status: AC
  Filled 2023-04-04: qty 1

## 2023-04-04 MED ORDER — APIXABAN 5 MG PO TABS
5.0000 mg | ORAL_TABLET | Freq: Two times a day (BID) | ORAL | Status: DC
Start: 2023-04-05 — End: 2023-04-08
  Administered 2023-04-05 – 2023-04-08 (×7): 5 mg via ORAL
  Filled 2023-04-04 (×7): qty 1

## 2023-04-04 MED ORDER — APIXABAN 5 MG PO TABS: 5.0000 mg | ORAL_TABLET | Freq: Two times a day (BID) | ORAL | Status: DC

## 2023-04-04 MED ORDER — METHOCARBAMOL 1000 MG/10ML IJ SOLN: 500.0000 mg | Freq: Four times a day (QID) | INTRAMUSCULAR | Status: DC | PRN

## 2023-04-04 MED ORDER — MORPHINE SULFATE (PF) 4 MG/ML IV SOLN
INTRAVENOUS | Status: AC
  Filled 2023-04-04: qty 2

## 2023-04-04 MED ORDER — IRRISEPT - 450ML BOTTLE WITH 0.05% CHG IN STERILE WATER, USP 99.95% OPTIME
TOPICAL | Status: DC | PRN
  Administered 2023-04-04: 450 mL via TOPICAL

## 2023-04-04 MED ORDER — BUPIVACAINE LIPOSOME 1.3 % IJ SUSP
INTRAMUSCULAR | Status: AC
  Filled 2023-04-04: qty 20

## 2023-04-04 MED ORDER — DOCUSATE SODIUM 100 MG PO CAPS
100.0000 mg | ORAL_CAPSULE | Freq: Two times a day (BID) | ORAL | Status: DC
  Administered 2023-04-04 – 2023-04-08 (×9): 100 mg via ORAL
  Filled 2023-04-04 (×9): qty 1

## 2023-04-04 MED ORDER — LACTATED RINGERS IV BOLUS: 800.0000 mL | Freq: Once | INTRAVENOUS | Status: DC

## 2023-04-04 MED ORDER — FENTANYL CITRATE (PF) 250 MCG/5ML IJ SOLN
INTRAMUSCULAR | Status: AC
  Filled 2023-04-04: qty 5

## 2023-04-04 MED ORDER — METOCLOPRAMIDE HCL 5 MG PO TABS: 5.0000 mg | ORAL_TABLET | Freq: Three times a day (TID) | ORAL | Status: DC | PRN

## 2023-04-04 MED ORDER — FENTANYL CITRATE (PF) 100 MCG/2ML IJ SOLN: 25.0000 ug | INTRAMUSCULAR | Status: DC | PRN

## 2023-04-04 MED ORDER — ORAL CARE MOUTH RINSE: 15.0000 mL | Freq: Once | OROMUCOSAL | Status: AC

## 2023-04-04 SURGICAL SUPPLY — 48 items
BAG DECANTER FOR FLEXI CONT (MISCELLANEOUS) ×1 IMPLANT
BLADE SAW SGTL 18X1.27X75 (BLADE) ×1 IMPLANT
COOLER ICEMAN CLASSIC (MISCELLANEOUS) ×1 IMPLANT
COVER PERINEAL POST (MISCELLANEOUS) ×1 IMPLANT
COVER SURGICAL LIGHT HANDLE (MISCELLANEOUS) ×1 IMPLANT
DRAPE C-ARM 42X72 X-RAY (DRAPES) ×1 IMPLANT
DRAPE STERI IOBAN 125X83 (DRAPES) ×1 IMPLANT
DRAPE U-SHAPE 47X51 STRL (DRAPES) ×3 IMPLANT
DRSG AQUACEL AG ADV 3.5X10 (GAUZE/BANDAGES/DRESSINGS) ×1 IMPLANT
DURAPREP 26ML APPLICATOR (WOUND CARE) ×1 IMPLANT
ELECT BLADE 4.0 EZ CLEAN MEGAD (MISCELLANEOUS) ×1 IMPLANT
ELECT REM PT RETURN 9FT ADLT (ELECTROSURGICAL) ×1 IMPLANT
ELECTRODE BLDE 4.0 EZ CLN MEGD (MISCELLANEOUS) ×1 IMPLANT
ELECTRODE REM PT RTRN 9FT ADLT (ELECTROSURGICAL) ×1 IMPLANT
GLOVE BIOGEL PI IND STRL 8 (GLOVE) ×1 IMPLANT
GLOVE ECLIPSE 8.0 STRL XLNG CF (GLOVE) ×2 IMPLANT
GOWN STRL REUS W/ TWL LRG LVL3 (GOWN DISPOSABLE) ×3 IMPLANT
HEAD CERAMIC 36 PLUS5 (Hips) IMPLANT
HOOD PEEL AWAY T7 (MISCELLANEOUS) ×3 IMPLANT
JET LAVAGE IRRISEPT WOUND (IRRIGATION / IRRIGATOR) ×1 IMPLANT
KIT BASIN OR (CUSTOM PROCEDURE TRAY) ×1 IMPLANT
KIT TURNOVER KIT B (KITS) ×1 IMPLANT
LAVAGE JET IRRISEPT WOUND (IRRIGATION / IRRIGATOR) ×1 IMPLANT
LINER NEUTRAL 52X36MM PLUS 4 (Liner) IMPLANT
NDL HYPO 22X1.5 SAFETY MO (MISCELLANEOUS) ×1 IMPLANT
NEEDLE HYPO 22X1.5 SAFETY MO (MISCELLANEOUS) ×1 IMPLANT
NS IRRIG 1000ML POUR BTL (IV SOLUTION) ×1 IMPLANT
PACK TOTAL JOINT (CUSTOM PROCEDURE TRAY) ×1 IMPLANT
PAD ARMBOARD 7.5X6 YLW CONV (MISCELLANEOUS) ×1 IMPLANT
PAD COLD SHLDR WRAP-ON (PAD) ×1 IMPLANT
PIN SECTOR W/GRIP ACE CUP 52MM (Hips) IMPLANT
SCREW 6.5MMX25MM (Screw) IMPLANT
SET HNDPC FAN SPRY TIP SCT (DISPOSABLE) ×1 IMPLANT
STEM FEMORAL SZ8 STD ACTIS (Stem) IMPLANT
STRIP CLOSURE SKIN 1/2X4 (GAUZE/BANDAGES/DRESSINGS) ×1 IMPLANT
SUT ETHIBOND NAB CT1 #1 30IN (SUTURE) ×2 IMPLANT
SUT ETHILON 3 0 PS 1 (SUTURE) IMPLANT
SUT MNCRL AB 3-0 PS2 18 (SUTURE) ×1 IMPLANT
SUT VIC AB 0 CT1 27XBRD ANBCTR (SUTURE) ×3 IMPLANT
SUT VIC AB 1 CT1 27XBRD ANBCTR (SUTURE) ×3 IMPLANT
SUT VIC AB 1 CT1 36 (SUTURE) IMPLANT
SUT VIC AB 2-0 CT2 27 (SUTURE) ×2 IMPLANT
SYR 30ML LL (SYRINGE) ×1 IMPLANT
TOWEL GREEN STERILE (TOWEL DISPOSABLE) ×1 IMPLANT
TOWEL GREEN STERILE FF (TOWEL DISPOSABLE) ×1 IMPLANT
TRAY FOLEY MTR SLVR 16FR STAT (SET/KITS/TRAYS/PACK) ×1 IMPLANT
TUBE SUCT ARGYLE STRL (TUBING) IMPLANT
WATER STERILE IRR 1000ML POUR (IV SOLUTION) ×1 IMPLANT

## 2023-04-04 NOTE — Anesthesia Procedure Notes (Signed)
 Spinal  Patient location during procedure: OR Start time: 04/04/2023 7:37 AM End time: 04/04/2023 7:42 AM Reason for block: surgical anesthesia Staffing Performed: anesthesiologist  Anesthesiologist: Leonides Grills, MD Performed by: Leonides Grills, MD Authorized by: Leonides Grills, MD   Preanesthetic Checklist Completed: patient identified, IV checked, risks and benefits discussed, surgical consent, monitors and equipment checked, pre-op evaluation and timeout performed Spinal Block Patient position: sitting Prep: DuraPrep Patient monitoring: cardiac monitor, continuous pulse ox and blood pressure Approach: midline Location: L3-4 Injection technique: single-shot Needle Needle type: Pencan  Needle gauge: 24 G Needle length: 9 cm Assessment Sensory level: T10 Events: CSF return Additional Notes Functioning IV was confirmed and monitors were applied. Sterile prep and drape, including hand hygiene and sterile gloves were used. The patient was positioned and the spine was prepped. The skin was anesthetized with lidocaine.  Free flow of clear CSF was obtained prior to injecting local anesthetic into the CSF.  The spinal needle aspirated freely following injection.  The needle was carefully withdrawn.  The patient tolerated the procedure well.
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
## 2023-04-04 NOTE — Brief Op Note (Signed)
   04/04/2023  10:18 AM  PATIENT:  Donna Rios  73 y.o. female  PRE-OPERATIVE DIAGNOSIS:  left hip osteoarthritis  POST-OPERATIVE DIAGNOSIS:  left hip osteoarthritis  PROCEDURE:  Procedure(s): LEFT TOTAL HIP ARTHROPLASTY ANTERIOR APPROACH  SURGEON:  Surgeon(s): Cammy Copa, MD  ASSISTANT: magnant pa  ANESTHESIA:   spinal  EBL: 150 ml    Total I/O In: 200 [I.V.:200] Out: -   BLOOD ADMINISTERED: none  DRAINS: none   LOCAL MEDICATIONS USED: Marcaine morphine clonidine Exparel vancomycin powder  SPECIMEN:  No Specimen  COUNTS:  YES  TOURNIQUET:  * No tourniquets in log *  DICTATION: .Other Dictation: Dictation Number [id number]  PLAN OF CARE: Admit for overnight observation  PATIENT DISPOSITION:  PACU - hemodynamically stable

## 2023-04-04 NOTE — Op Note (Signed)
 NAME: Donna Rios, Donna Rios MEDICAL RECORD NO: [id number] ACCOUNT NO: [id number] DATE OF BIRTH: 23-Sep-1950 FACILITY: [facility] LOCATION: [facility] PHYSICIAN: Graylin Shiver. August Saucer, MD  Operative Report   DATE OF PROCEDURE: 04/04/2023  PREOPERATIVE DIAGNOSIS:  Left hip arthritis.  POSTOPERATIVE DIAGNOSIS:  Left hip arthritis.  PROCEDURE:  Left total hip replacement using DePuy components size 8 ACTIS stem with standard ceramic 36 mm ball with +4 liner and a 52 mm pinnacle Gription cup press fit with one screw.  SURGEON:  Graylin Shiver. August Saucer, MD  ASSISTANT:  Karenann Cai, PA  INDICATIONS:  This is a 73 year old patient with severe left hip arthritis.  She presents for operative management after explanation of risks and benefits.  DESCRIPTION OF PROCEDURE:  The patient was brought to the operating room where a spinal anesthetic was induced.  Preoperative antibiotics administered.  Timeout was called.  The patient was placed on the Hana bed with the right leg well padded.  The left  hip region was prescrubbed with alcohol and Betadine allowed to air dry, prepped with DuraPrep solution and draped in a sterile manner.  [name] was used to cover the operative field.  After calling timeout, incision was made about 2 cm distal and  posterior to the anterior superior iliac crest.  Skin and subcutaneous tissue were sharply divided.  The incision was about 9 cm.  The soft tissue on the fascia lata itself was elevated with the sponge to protect the crossing branches of the lateral  femoral cutaneous nerve.  The fascia lata itself was then split with a #15 blade up to the anterior superior iliac crest.  The plane was then developed between the tensor and the rectus.  Crossing vessels were cauterized using electrocautery.  An  acetabular smooth covers were placed on the superior and inferior aspect of the femoral neck.  At this time, the capsule was incised in a T-shaped manner and tagged with #1 Vicryl sutures.  Osteophytes  were removed from the femoral head in order to  restore normal anatomic landmarks.  The femoral neck was then cut using the oscillating saw after fluoroscopic evaluation.  Head was then removed.  At this time, the neck cut was confirmed to be in the appropriate position under fluoroscopy.  Significant  osteophytes were present around the acetabulum and they were removed.  At this time, under fluoroscopic guidance, the hip was reamed in about 45 degrees of abduction and 15 degrees of anteversion up to a size 51 cup.  Inner table was maintained.  The  irrigation was performed using IrriSept after the incision after the arthrotomy and after the reaming.  Next, the cup was then tapped into position with very good press fit obtained.  One screw was placed.  +4 liner was placed.  Next, attention was  directed towards the femur.  Femoral lift was placed.  Leg was externally rotated about 125 degrees and taken into adduction and extension.  With the Mueller retractor and trochanteric retractor placed, the conjoined tendons were released.  At this time,  broaching was performed up to a size 8.  With the broach in place, hip reduction was performed with the +5 ball, which gave approximately equal leg lengths as well as very good stability with external rotation to about 60 degrees and extension to about  90 degrees.  Trial components were removed on the femoral side.  Thorough irrigation was performed.  IrriSept solution was used inside the femoral canal, which was then removed.  Vancomycin powder placed.  The ACTIS stem was then placed with very good  press fit obtained.  A +5 ball placed with same stability parameters maintained and that was the true implant that was placed 36 mm +5.  Thorough irrigation was performed with 3 liters of pulsatile irrigation.  Vanc powder placed on the implant.  Capsule  was closed using #1 Vicryl suture followed by interrupted inverted #1 Vicryl suture to close the dead space.  #1  Vicryl suture was then used to close the fascia lata.  Skin was closed using interrupted inverted 0 Vicryl suture, 2-0 Vicryl suture, and  3-0 Monocryl.  It should be noted that we anesthetized some of the muscle and skin using a combination of the Marcaine, saline, and Exparel along with Marcaine, morphine, and clonidine.  Impervious Aquacel dressing placed.  The patient was transferred to  the recovery room in stable condition.  Leg lengths approximately equal at the conclusion of the case.  [name]'s assistance was required at all times for retraction, opening, closing and mobilization of tissue.  His assistance was of medical necessity.   PUS D: 04/04/2023 10:25:09 am T: 04/04/2023 11:50:00 am  JOB: [id number]/ [id number]

## 2023-04-04 NOTE — Transfer of Care (Signed)
 Immediate Anesthesia Transfer of Care Note  Patient: Donna Rios  Procedure(s) Performed: LEFT TOTAL HIP ARTHROPLASTY ANTERIOR APPROACH (Left: Hip)  Patient Location: PACU  Anesthesia Type:MAC and Spinal  Level of Consciousness: awake, alert , and oriented  Airway & Oxygen Therapy: Patient Spontanous Breathing  Post-op Assessment: Report given to RN and Post -op Vital signs reviewed and stable  Post vital signs: Reviewed and stable  Last Vitals:  Vitals Value Taken Time  BP 82/57 04/04/23 1030  Temp 36.4 C 04/04/23 1027  Pulse 70 04/04/23 1035  Resp 9 04/04/23 1035  SpO2 94 % 04/04/23 1035  Vitals shown include unfiled device data.  Last Pain:  Vitals:   04/04/23 1027  TempSrc:   PainSc: 0-No pain         Complications: No notable events documented.

## 2023-04-04 NOTE — Evaluation (Signed)
 Physical Therapy Evaluation Patient Details Name: Donna Rios MRN: [id number] DOB: February 04, 1950 Today's Date: 04/04/2023  History of Present Illness  Pt is 73 year old presented to [facility] on  04/04/23 for lt THR. PMH - arthritis, TAVR, HTN, copd  Clinical Impression  Pt admitted with above diagnosis and presents to PT with functional limitations due to deficits listed below (See PT problem list). Pt needs skilled PT to maximize independence and safety. Pt doing well post op day #0 and expect she will progress well. Has supportive husband at home and plans to return home with him. Will continue to progress mobility through pt's likely short stay.           If plan is discharge home, recommend the following: A little help with walking and/or transfers;A little help with bathing/dressing/bathroom;Assistance with cooking/housework;Assist for transportation;Help with stairs or ramp for entrance   Can travel by private vehicle        Equipment Recommendations BSC/3in1  Recommendations for Other Services       Functional Status Assessment Patient has had a recent decline in their functional status and demonstrates the ability to make significant improvements in function in a reasonable and predictable amount of time.     Precautions / Restrictions Precautions Precautions: None Restrictions Weight Bearing Restrictions Per Provider Order: Yes LLE Weight Bearing Per Provider Order: Weight bearing as tolerated      Mobility  Bed Mobility Overal bed mobility: Needs Assistance Bed Mobility: Supine to Sit     Supine to sit: Min assist     General bed mobility comments: Assist to bring left leg off of bed    Transfers Overall transfer level: Needs assistance Equipment used: Rolling walker (2 wheels) Transfers: Sit to/from Stand Sit to Stand: Contact guard assist           General transfer comment: Verbal cues for hand placement    Ambulation/Gait Ambulation/Gait assistance:  Contact guard assist Gait Distance (Feet): 30 Feet Assistive device: Rolling walker (2 wheels) Gait Pattern/deviations: Step-through pattern, Decreased stride length, Decreased stance time - left Gait velocity: decr Gait velocity interpretation: <1.31 ft/sec, indicative of household ambulator   General Gait Details: Assist for safety. Verbal cues for initial sequencing  Stairs            Wheelchair Mobility     Tilt Bed    Modified Rankin (Stroke Patients Only)       Balance Overall balance assessment: Mild deficits observed, not formally tested                                           Pertinent Vitals/Pain Pain Assessment Pain Assessment: Faces Faces Pain Scale: Hurts little more Pain Location: lt hip Pain Descriptors / Indicators: Grimacing, Sore Pain Intervention(s): Limited activity within patient's tolerance, Monitored during session    Home Living Family/patient expects to be discharged to:: Private residence Living Arrangements: Spouse/significant other Available Help at Discharge: Family;Available 24 hours/day Type of Home: Mobile home Home Access: Ramped entrance       Home Layout: One level Home Equipment: Agricultural consultant (2 wheels);Cane - single point      Prior Function Prior Level of Function : Independent/Modified Independent                     Extremity/Trunk Assessment   Upper Extremity Assessment Upper Extremity Assessment: Overall [facility]  for tasks assessed    Lower Extremity Assessment Lower Extremity Assessment: LLE deficits/detail LLE Deficits / Details: Limited by post op soreness       Communication   Communication Communication: No apparent difficulties    Cognition Arousal: Alert Behavior During Therapy: WFL for tasks assessed/performed   PT - Cognitive impairments: No apparent impairments                         Following commands: Intact       Cueing Cueing Techniques: Verbal  cues     General Comments      Exercises Total Joint Exercises Ankle Circles/Pumps: AROM, Both, 10 reps, Seated Quad Sets: AROM, Both, 5 reps, Supine Heel Slides: AAROM, Left, 5 reps, Supine   Assessment/Plan    PT Assessment Patient needs continued PT services  PT Problem List Decreased strength;Decreased mobility;Decreased knowledge of use of DME;Pain       PT Treatment Interventions DME instruction;Gait training;Stair training;Functional mobility training;Therapeutic activities;Therapeutic exercise;Patient/family education    PT Goals (Current goals can be found in the Care Plan section)  Acute Rehab PT Goals Patient Stated Goal: return home PT Goal Formulation: With patient Time For Goal Achievement: 04/11/23 Potential to Achieve Goals: Good    Frequency 7X/week     Co-evaluation               AM-PAC PT "6 Clicks" Mobility  Outcome Measure Help needed turning from your back to your side while in a flat bed without using bedrails?: A Little Help needed moving from lying on your back to sitting on the side of a flat bed without using bedrails?: A Little Help needed moving to and from a bed to a chair (including a wheelchair)?: A Little Help needed standing up from a chair using your arms (e.g., wheelchair or bedside chair)?: A Little Help needed to walk in hospital room?: A Little Help needed climbing 3-5 steps with a railing? : A Lot 6 Click Score: 17    End of Session Equipment Utilized During Treatment: Gait belt Activity Tolerance: Patient tolerated treatment well Patient left: in chair;with call bell/phone within reach;with chair alarm set Nurse Communication: Mobility status PT Visit Diagnosis: Other abnormalities of gait and mobility (R26.89);Difficulty in walking, not elsewhere classified (R26.2);Pain Pain - Right/Left: Left Pain - part of body: Hip    Time: 1610-9604 PT Time Calculation (min) (ACUTE ONLY): 41 min   Charges:   PT Evaluation $PT  Eval Low Complexity: 1 Low PT Treatments $Gait Training: 8-22 mins $Therapeutic Exercise: 8-22 mins PT General Charges $$ ACUTE PT VISIT: 1 Visit         [facility] PT Acute Rehabilitation Services Office [phone]   Angelina Ok [facility] 04/04/2023, 6:46 PM

## 2023-04-05 ENCOUNTER — Encounter (HOSPITAL_COMMUNITY): Payer: Self-pay | Admitting: Orthopedic Surgery

## 2023-04-05 DIAGNOSIS — M1612 Unilateral primary osteoarthritis, left hip: Secondary | ICD-10-CM | POA: Diagnosis not present

## 2023-04-05 LAB — CBC WITH DIFFERENTIAL/PLATELET
Abs Immature Granulocytes: 0.07 10*3/uL (ref 0.00–0.07)
Basophils Absolute: 0 10*3/uL (ref 0.0–0.1)
Basophils Relative: 0 %
Eosinophils Absolute: 0.1 10*3/uL (ref 0.0–0.5)
Eosinophils Relative: 0 %
HCT: 25.2 % — ABNORMAL LOW (ref 36.0–46.0)
Hemoglobin: 8.7 g/dL — ABNORMAL LOW (ref 12.0–15.0)
Immature Granulocytes: 1 %
Lymphocytes Relative: 19 %
Lymphs Abs: 2.7 10*3/uL (ref 0.7–4.0)
MCH: 32.2 pg (ref 26.0–34.0)
MCHC: 34.5 g/dL (ref 30.0–36.0)
MCV: 93.3 fL (ref 80.0–100.0)
Monocytes Absolute: 1.4 10*3/uL — ABNORMAL HIGH (ref 0.1–1.0)
Monocytes Relative: 10 %
Neutro Abs: 9.7 10*3/uL — ABNORMAL HIGH (ref 1.7–7.7)
Neutrophils Relative %: 70 %
Platelets: 127 10*3/uL — ABNORMAL LOW (ref 150–400)
RBC: 2.7 MIL/uL — ABNORMAL LOW (ref 3.87–5.11)
RDW: 12.9 % (ref 11.5–15.5)
WBC: 13.9 10*3/uL — ABNORMAL HIGH (ref 4.0–10.5)
nRBC: 0 % (ref 0.0–0.2)

## 2023-04-05 MED ORDER — ENSURE ENLIVE PO LIQD
237.0000 mL | Freq: Two times a day (BID) | ORAL | Status: DC
  Administered 2023-04-05 – 2023-04-08 (×7): 237 mL via ORAL

## 2023-04-05 NOTE — Progress Notes (Signed)
 Bedside commode delivered to room and pt husband took home today.

## 2023-04-05 NOTE — Progress Notes (Signed)
  Subjective: Donna Rios is a 73 y.o. female s/p left THA.  They are POD 1.  Pt's pain is controlled.  Pt has ambulated with some difficulty.  Was able to walk about 30 feet with physical therapy.  She mostly complains of soreness around the lateral hip.  No chest pain, palpitations, shortness of breath.  She has been able to ambulate without any dizziness or lightheadedness.  She did have an episode of what seems to be vasovagal syncope when she was on the toilet.  She was helped off the toilet by RN and PT and vital signs were stable immediately after this episode with patient noting no significant confusion or urination and no significant complaints aside from hip soreness while she is in the chair 5 minutes after this episode.  Hemoglobin checked and down to 8.7 postoperatively.  Objective: Vital signs in last 24 hours: Temp:  [97.7 F (36.5 C)-98.3 F (36.8 C)] 98.1 F (36.7 C) (03/11 1432) Pulse Rate:  [68-86] 79 (03/11 1432) Resp:  [16-20] 16 (03/11 1143) BP: (86-138)/(51-73) 107/51 (03/11 1432) SpO2:  [96 %-100 %] 100 % (03/11 1432)  Intake/Output from previous day: 03/10 0701 - 03/11 0700 In: 300 [I.V.:200; IV Piggyback:100] Out: 1750 [Urine:1750] Intake/Output this shift: No intake/output data recorded.  Exam:  No gross blood or drainage overlying the dressing 2+ DP pulse Sensation intact distally in the left foot Able to dorsiflex and plantarflex the left foot Leg lengths are roughly equal with knees flexed to equal range of motion   Labs: Recent Labs    04/05/23 1226  HGB 8.7*   Recent Labs    04/05/23 1226  WBC 13.9*  RBC 2.70*  HCT 25.2*  PLT 127*   No results for input(s): "NA", "K", "CL", "CO2", "BUN", "CREATININE", "GLUCOSE", "CALCIUM" in the last 72 hours. No results for input(s): "LABPT", "INR" in the last 72 hours.  Assessment/Plan: Pt is POD 1 s/p left THA.    -Plan to discharge to home with home health physical therapy likely tomorrow pending  patient's pain and PT eval  -WBAT with a walker  -Follow-up for clinical recheck in 2 weeks in the clinic with myself or Dr. Jonathon Jordan Mikael Skoda 04/05/2023, 4:06 PM

## 2023-04-05 NOTE — Progress Notes (Signed)
 Physical Therapy Treatment Patient Details Name: Donna Rios MRN: [id number] DOB: 1950/07/12 Today's Date: 04/05/2023   History of Present Illness Pt is 73 year old presented to [facility] on 04/04/23 for L THR. 3/11 pre-syncopal episode on toilet after AM PT session. PMH - arthritis, TAVR, HTN, COPD.    PT Comments  Pt received in supine, c/o severe pain "12/10" per pt (RN notified and gave PO meds during session), pt with decreased memory of afternoon events, stating she was able to walk to bathroom, however per RN she just pivoted to [facility]. Pt needing assist for rolling multiple times and frequent repositioning in supine, and greatly increased time needed to don bil thigh-high TED hose. Once TED hose donned and after supine LLE exercises, pt more drowsy and BP remains soft in supine posture (BP 97/56) so defer OOB, per RN pt also c/o dizziness getting up to [facility] ~1 hour prior to PM session. Pt currently still needing +2 assist for safety with OOB activity due to symptomatic low BP with any activity and severe c/o pain/guarding and decreased memory causing increased risk of falls. Pt continues to benefit from PT services to progress toward functional mobility goals.     If plan is discharge home, recommend the following: A little help with bathing/dressing/bathroom;Assistance with cooking/housework;Assist for transportation;Help with stairs or ramp for entrance;A lot of help with walking and/or transfers   Can travel by private vehicle        Equipment Recommendations  BSC/3in1 (pt reports she has RW and rollator; BSC already delivered to her room)    Recommendations for Other Services OT consult (spouse can assist but may need OT consult if cognition still altered in AM)     Precautions / Restrictions Precautions Precautions: Fall Recall of Precautions/Restrictions: Intact Precaution/Restrictions Comments: soft BP with positional changes Required Braces or Orthoses: Other Brace Other Brace:  personal L hinged knee brace when OOB (for comfort) Restrictions Weight Bearing Restrictions Per Provider Order: Yes LLE Weight Bearing Per Provider Order: Weight bearing as tolerated     Mobility  Bed Mobility Overal bed mobility: Needs Assistance Bed Mobility: Rolling Rolling: Min assist, Contact guard assist, Used rails   Supine to sit: Min assist, HOB elevated, Used rails     General bed mobility comments: Pt rolling toward her R side ~6-7 reps during repositioning of pillows/blankets to achieve optimal posture for comfort, pt reports pain is very severe in supine and when HOB flat due to hx of lumbar pain, needing LLE elevated and ultimately positioned in semi-sidelying position toward her R side.    Transfers Overall transfer level: Needs assistance Equipment used: Rolling walker (2 wheels) Transfers: Sit to/from Stand, Bed to chair/wheelchair/BSC Sit to Stand: From elevated surface, Mod assist, +2 physical assistance   Step pivot transfers: Max assist, +2 physical assistance, From elevated surface       General transfer comment: defer, pt pain too severe and her BP remains soft in PM session    Ambulation/Gait Ambulation/Gait assistance:  (defer) Gait Distance (Feet): 30 Feet Assistive device: Rolling walker (2 wheels) Gait Pattern/deviations: Step-through pattern, Decreased stride length, Decreased stance time - left, Step-to pattern, Trunk flexed Gait velocity: decr     General Gait Details: defer, pt unsafe to ambulate due to severe pain, low BP and somewhat increased confusion.   Stairs Stairs:  (pt able to step up ~2" threshold into bathroom (pt stepping over small ramp threshold) but plan to bring curb height step to simulate home entry  in PM session)           Wheelchair Mobility     Tilt Bed    Modified Rankin (Stroke Patients Only)       Balance Overall balance assessment: Needs assistance Sitting-balance support: Bilateral upper extremity  supported, Feet supported Sitting balance-Leahy Scale: Fair Sitting balance - Comments: Defer in PM session - pain severe, BP soft   Standing balance support: Bilateral upper extremity supported, During functional activity, Reliant on assistive device for balance Standing balance-Leahy Scale: Poor Standing balance comment: defer in PM                            Communication Communication Communication: No apparent difficulties  Cognition Arousal: Lethargic Behavior During Therapy: Flat affect   PT - Cognitive impairments: Attention, Initiation, Sequencing, Problem solving, Safety/Judgement                       PT - Cognition Comments: Pt more drowsy and poor recall of afternoon, pt states she got up into bathroom with RN assist, but per RN she just pivoted OOB to [facility] Following commands: Intact      Cueing Cueing Techniques: Verbal cues  Exercises Total Joint Exercises Ankle Circles/Pumps: AROM, Both, 10 reps, Supine Quad Sets: AROM, Both, 5 reps, Supine (>5 reps) Short Arc Quad: AAROM, Left, 5 reps, Supine Heel Slides: Supine, AROM, Right, 10 reps Hip ABduction/ADduction: AAROM, Left, Supine (a few reps, pain limiting tolerance) Straight Leg Raises: AAROM, Left, Supine (a few reps while repositioning, pt unable to lift whole limb without AA) Long Arc Quad: AROM, Left, 5 reps, Seated Knee Flexion:  (appears WFL, pt requested PTA assist her to don her L hinged knee brace for comfort in supine prior to getting up.) Other Exercises Other Exercises: IS x 10 reps with teachback, pt achieves [phone] mL    General Comments General comments (skin integrity, edema, etc.): BP 97/56 (69) HR 87 bpm supine with HOB 36 degrees; pt reporting pain slightly improved after RN gave her pain meds and pt repositioned to semi-sidelying toward her R side. Thigh-high TED hose placed during session prior to BP check.      Pertinent Vitals/Pain Pain Assessment Pain Assessment:  0-10 Pain Score: 10-Worst pain ever Faces Pain Scale: Hurts whole lot Pain Location: L hip with ROM and repositioning Pain Descriptors / Indicators: Grimacing, Sore, Operative site guarding, Discomfort, Sharp Pain Intervention(s): Limited activity within patient's tolerance, Monitored during session, Repositioned, Patient requesting pain meds-RN notified, RN gave pain meds during session, Ice applied, Utilized relaxation techniques    Home Living                          Prior Function            PT Goals (current goals can now be found in the care plan section) Acute Rehab PT Goals Patient Stated Goal: return home once I am feeling better and VSS PT Goal Formulation: With patient Time For Goal Achievement: 04/11/23 Progress towards PT goals: Progressing toward goals (slowly)    Frequency    7X/week      PT Plan      Co-evaluation              AM-PAC PT "6 Clicks" Mobility   Outcome Measure  Help needed turning from your back to your side while in a flat bed without using  bedrails?: A Little Help needed moving from lying on your back to sitting on the side of a flat bed without using bedrails?: A Lot Help needed moving to and from a bed to a chair (including a wheelchair)?: A Lot Help needed standing up from a chair using your arms (e.g., wheelchair or bedside chair)?: A Lot (from lower surface) Help needed to walk in hospital room?: A Little Help needed climbing 3-5 steps with a railing? : Total 6 Click Score: 13    End of Session Equipment Utilized During Treatment: Other (comment) (pt defer OOB -pain) Activity Tolerance: Patient limited by fatigue;Patient limited by pain Patient left: with call bell/phone within reach;in bed;with bed alarm set (semi-sidelying toward her R, TED hose donned, iceman over L hip, LLE elevated for comfort in sidelying, HOB 36 deg as she recently drank water) Nurse Communication: Mobility status;Other (comment);Patient  requests pain meds (assist level needed, use BSC due to soft BP/presyncopal episode in AM; pt c/o feeling feverish) PT Visit Diagnosis: Other abnormalities of gait and mobility (R26.89);Difficulty in walking, not elsewhere classified (R26.2);Pain Pain - Right/Left: Left Pain - part of body: Hip     Time: 1478-2956 PT Time Calculation (min) (ACUTE ONLY): 49 min  Charges:    $Therapeutic Exercise: 8-22 mins $Therapeutic Activity: 23-37 mins PT General Charges $$ ACUTE PT VISIT: 1 Visit                     Raeli Wiens P., PTA Acute Rehabilitation Services Secure Chat Preferred 9a-5:30pm Office: [phone]    Dorathy Kinsman [facility] 04/05/2023, 7:02 PM

## 2023-04-05 NOTE — Care Management Obs Status (Signed)
 MEDICARE OBSERVATION STATUS NOTIFICATION   Patient Details  Name: DALILAH CURLIN MRN: [id number] Date of Birth: [birth year]   Medicare Observation Status Notification Given:  Yes    Lorri Frederick, LCSW 04/05/2023, 9:54 AM

## 2023-04-05 NOTE — Plan of Care (Signed)

## 2023-04-05 NOTE — Progress Notes (Signed)
 Physical Therapy Treatment Patient Details Name: Donna Rios MRN: [id number] DOB: 04-May-1950 Today's Date: 04/05/2023   History of Present Illness Pt is 73 year old presented to [facility] on 04/04/23 for L THR. PMH - arthritis, TAVR, HTN, COPD.    PT Comments  Pt received in supine after premedication, pt agreeable to therapy session and with good participation in supine and seated LLE exercises with mod cues needed as pt c/o "feeling a little slow/drunk" in supine due to pain meds. Pt needing up to minA with heavy cues and use of hospital bed features to perform bed mobility and pt unable to stand from lower bed height with light lift assist, so bed elevated 4-6" higher before pt able to stand to RW with minA. Pt reports her bed at home is very high and she also has option of elevating lift chair she can use PRN at home. Pt performs household distance gait trial ~84ft with RW and minA progressing to CGA, pt denies lightheadedness or nausea with activity, HR to ~95 bpm with exertion, ~70's bpm resting. Pt reports pain is better standing/ambulating than when in supine performing exercises. Pt spouse present and able to demo back guarding positions and assisting with CGA via gait belt to mobilize into bathroom with Supervision/guidance cueing by PTA on improved body mechanics/technique. Pt up on elevated BSC over toilet at end of session, pt requesting >10 mins due to bowel/bladder urgency and spouse present, agreeable to assist her with notifying staff (pt agrees to pull wall call bell next to toilet) prior to getting up. RN/NT also notified. Pt continues to benefit from PT services to progress toward functional mobility goals.    If plan is discharge home, recommend the following: A little help with walking and/or transfers;A little help with bathing/dressing/bathroom;Assistance with cooking/housework;Assist for transportation;Help with stairs or ramp for entrance   Can travel by private vehicle         Equipment Recommendations  BSC/3in1    Recommendations for Other Services       Precautions / Restrictions Precautions Precautions: Fall Recall of Precautions/Restrictions: Intact Precaution/Restrictions Comments: Pt agrees to press call bell prior to getting up unassisted; spouse present and also agreeable to help her call prior to her getting up. Required Braces or Orthoses: Other Brace Other Brace: personal L hinged knee brace -pt request for comfort Restrictions Weight Bearing Restrictions Per Provider Order: Yes LLE Weight Bearing Per Provider Order: Weight bearing as tolerated     Mobility  Bed Mobility Overal bed mobility: Needs Assistance Bed Mobility: Supine to Sit     Supine to sit: Min assist, HOB elevated, Used rails     General bed mobility comments: Assist to bring left leg off of bed and cues for sequencing/using "good" RLE to bridge her hips toward EOB . Pt also shown how to use gait belt as a leg lifter PRN.    Transfers Overall transfer level: Needs assistance Equipment used: Rolling walker (2 wheels) Transfers: Sit to/from Stand Sit to Stand: Min assist, From elevated surface           General transfer comment: Pt attempted to stand from lowest bed height and unable to lift hips off bed with minA, pt then states her bed is higher surface and she has a lift chair as well, so bed height elevated a few inches and pt able to stand with minA and cues for sequencing    Ambulation/Gait Ambulation/Gait assistance: Contact guard assist, Min assist Gait Distance (Feet): 30  Feet Assistive device: Rolling walker (2 wheels) Gait Pattern/deviations: Step-through pattern, Decreased stride length, Decreased stance time - left, Step-to pattern, Trunk flexed Gait velocity: decr     General Gait Details: Assist for safety. Verbal cues for initial sequencing. Occasional minA when turning and initially for RW management as pt tends to lean forward over front of AD  causing increased fall risk; pt denies nausea, dizziness or shortness of breath during gait trial, but c/o urinary urgency and c/o "feeling drunk" from pain meds prior to sitting on toilet.   Stairs Stairs:  (pt able to step up ~2" threshold into bathroom (pt stepping over small ramp threshold) but plan to bring curb height step to simulate home entry in PM session)           Wheelchair Mobility     Tilt Bed    Modified Rankin (Stroke Patients Only)       Balance Overall balance assessment: Mild deficits observed, not formally tested (Poor- standing with RW for some dynamic tasks, Good sitting EOB unsupported initially)                                          Communication Communication Communication: No apparent difficulties  Cognition Arousal: Alert Behavior During Therapy: WFL for tasks assessed/performed   PT - Cognitive impairments: No apparent impairments                       PT - Cognition Comments: Occasional tangential questions/statements and pt states "I'm a little drunk" but she denies nausea/dizziness and able to follow most 1 and 2-step commands well. Following commands: Intact      Cueing Cueing Techniques: Verbal cues  Exercises Total Joint Exercises Ankle Circles/Pumps: AROM, Both, 10 reps, Seated Quad Sets: AROM, Both, 5 reps, Supine (>5 reps) Heel Slides: AAROM, Left, 5 reps, Supine (~7 reps) Hip ABduction/ADduction: AAROM, Left, Seated (a few reps for teachback with leg in reclined posture) Straight Leg Raises: AAROM, Left, Supine (a few reps while repositioning, pt unable to lift whole limb without AA) Long Arc Quad: AROM, Left, 5 reps, Seated Knee Flexion:  (appears WFL, pt requested PTA assist her to don her L hinged knee brace for comfort in supine prior to getting up.)    General Comments General comments (skin integrity, edema, etc.): Pt reports baseline urinary incontinence, poise pad/hospital mesh briefs placed  prior to standing/gait; pt drinking water multiple times during session and requesting to also drink her miralax so she can have BM prior to DC (cups already in room on her tray table). Pt reports iceman melts too fast so she didn't have it on prior to session.      Pertinent Vitals/Pain Pain Assessment Pain Assessment: Faces Pain Score: 10-Worst pain ever Faces Pain Scale: Hurts little more Pain Location: L hip with activity; pt states 10/10 prior to moving her LLE then after transfer to EOB and standing, pt states pain decreased. Pain Descriptors / Indicators: Grimacing, Sore, Operative site guarding, Discomfort, Sharp Pain Intervention(s): Limited activity within patient's tolerance, Monitored during session, Premedicated before session, Repositioned, Ice applied    Home Living                          Prior Function            PT Goals (current goals  can now be found in the care plan section) Acute Rehab PT Goals Patient Stated Goal: return home PT Goal Formulation: With patient Time For Goal Achievement: 04/11/23 Progress towards PT goals: Progressing toward goals    Frequency    7X/week      PT Plan      Co-evaluation              AM-PAC PT "6 Clicks" Mobility   Outcome Measure  Help needed turning from your back to your side while in a flat bed without using bedrails?: A Little Help needed moving from lying on your back to sitting on the side of a flat bed without using bedrails?: A Little Help needed moving to and from a bed to a chair (including a wheelchair)?: A Little Help needed standing up from a chair using your arms (e.g., wheelchair or bedside chair)?: A Lot (from lower surface) Help needed to walk in hospital room?: A Little Help needed climbing 3-5 steps with a railing? : A Lot 6 Click Score: 16    End of Session Equipment Utilized During Treatment: Gait belt;Other (comment) (pt personal L hinged knee brace (for comfort per her  request)) Activity Tolerance: Patient tolerated treatment well Patient left: in chair;with call bell/phone within reach;with family/visitor present (pt on BSC over toilet in bathroom, spouse present, pt requesting her crossword puzzle and anticipates >10 mins needed for toileting, RN/NT notified) Nurse Communication: Mobility status;Other (comment) (assist level needed, pt may need help leaving bathroom if it takes >10 mins, spouse in bathroom with her and was able to provide physical assist as well after instruction on guarding with gait belt) PT Visit Diagnosis: Other abnormalities of gait and mobility (R26.89);Difficulty in walking, not elsewhere classified (R26.2);Pain Pain - Right/Left: Left Pain - part of body: Hip     Time: 1029-1119 PT Time Calculation (min) (ACUTE ONLY): 50 min  Charges:    $Gait Training: 8-22 mins $Therapeutic Exercise: 8-22 mins $Therapeutic Activity: 8-22 mins PT General Charges $$ ACUTE PT VISIT: 1 Visit                     Phil Corti P., PTA Acute Rehabilitation Services Secure Chat Preferred 9a-5:30pm Office: [phone]    Dorathy Kinsman [facility] 04/05/2023, 12:05 PM

## 2023-04-05 NOTE — Progress Notes (Signed)
 Physical Therapy Treatment Patient Details Name: Donna Rios MRN: [id number] DOB: [birth year] Today's Date: 04/05/2023   History of Present Illness Pt is 73 year old presented to [facility] on 04/04/23 for L THR. 3/11 pre-syncopal episode on toilet after AM PT session. PMH - arthritis, TAVR, HTN, COPD.    PT Comments  Pt received in bathroom, spouse pulled call bell and RN/NT not arriving within a minute or two so PTA returned to her room to assist pt, when PTA arrived pt noted to be lethargic, c/o nausea and had trash can pulled up in front of her. Pt also with pale pallor and appears diaphoretic. RN/NT called to room and PTA brought her chair next to [facility] and NT arrived to provide +2 assist, pt needing up to maxA +2 to step pivot from [facility] to recliner due to presyncopal symptoms, pt more confused and mumbling, needing multimodal cues. Once in recliner, BP checked and noted to be 86/58 HR 68 bpm, RN arriving and notified, MD or PA also arriving at end of session. Pt chair reclined further and BP reading 91/59 (69), an improvement and SpO2 WFL on RA throughout. Spouse and medical team remain in room with her at end of session, PTA mentions pre-existing order for TED hose and pt would benefit from BLE compression to see if this elps improve her hemodynamic stability. Pt continues to benefit from PT services to progress toward functional mobility goals, plan to see again in PM if symptoms improve if schedule allows. Anticipate pt will need at least 1-2 more sessions to ensure her VS remain stable prior to DC home.     If plan is discharge home, recommend the following: A little help with walking and/or transfers;A little help with bathing/dressing/bathroom;Assistance with cooking/housework;Assist for transportation;Help with stairs or ramp for entrance   Can travel by private vehicle        Equipment Recommendations  BSC/3in1    Recommendations for Other Services       Precautions / Restrictions  Precautions Precautions: Fall Recall of Precautions/Restrictions: Intact Precaution/Restrictions Comments: Pt agrees to press call bell prior to getting up unassisted; spouse present and also agreeable Required Braces or Orthoses: Other Brace Other Brace: personal L hinged knee brace -pt request for comfort Restrictions Weight Bearing Restrictions Per Provider Order: Yes LLE Weight Bearing Per Provider Order: Weight bearing as tolerated     Mobility  Bed Mobility Overal bed mobility: Needs Assistance Bed Mobility: Supine to Sit     Supine to sit: Min assist, HOB elevated, Used rails     General bed mobility comments: pt up on recliner chair at end of session, reclined due to soft BP    Transfers Overall transfer level: Needs assistance Equipment used: Rolling walker (2 wheels) Transfers: Sit to/from Stand, Bed to chair/wheelchair/BSC Sit to Stand: From elevated surface, Mod assist, +2 physical assistance   Step pivot transfers: Max assist, +2 physical assistance, From elevated surface       General transfer comment: Pt received on toilet with pale pallor, gripping small trash can, spouse present and had pulled wall call bell for her, pt diaphoretic and lethargic but mumbling responses to PTA questions. PTA brought pt's recliner on wheels to her and called NT to room to assist, pt needing modA for sit>stand to RW and maxA for assist to weight shift and step toward locked chair on her L side due to pre-syncopal symptoms/lethargy and increased confusion. Pt reclined in chair and transported to bedside, RN also called  to room and arriving once pt chair parked at bedside.    Ambulation/Gait Ambulation/Gait assistance:  (defer) Gait Distance (Feet): 30 Feet Assistive device: Rolling walker (2 wheels) Gait Pattern/deviations: Step-through pattern, Decreased stride length, Decreased stance time - left, Step-to pattern, Trunk flexed Gait velocity: decr     General Gait Details:  defer, pt unsafe to ambulate due to suspected (and confirmed) low BP after using toilet.   Stairs Stairs:  (pt able to step up ~2" threshold into bathroom (pt stepping over small ramp threshold) but plan to bring curb height step to simulate home entry in PM session)           Wheelchair Mobility     Tilt Bed    Modified Rankin (Stroke Patients Only)       Balance Overall balance assessment: Needs assistance Sitting-balance support: Bilateral upper extremity supported, Feet supported Sitting balance-Leahy Scale: Fair Sitting balance - Comments: pt using BUE support when PTA arrived due to increased lethargy after presyncopal episode on toilet   Standing balance support: Bilateral upper extremity supported, During functional activity, Reliant on assistive device for balance Standing balance-Leahy Scale: Poor Standing balance comment: +2 to pivot to chair after presyncopal episode when PTA arrived to help her off toilet                            Communication Communication Communication: No apparent difficulties  Cognition Arousal: Lethargic Behavior During Therapy: Flat affect   PT - Cognitive impairments: Attention, Initiation, Sequencing, Problem solving, Safety/Judgement                       PT - Cognition Comments: Pt more lethargic when PTA returned to her room to assist (spouse had pulled bathroom call bell but NT/RN had not arrived to her room yet to assist, secretary notified again that she will need extra help due to symptoms), and with poor awareness/carryover of cues needing multimodal cues and heavy +2 assist to move from toilet to recliner chair. Cognition improving as pt in recliner but still appears lethargic/groggy; MD or PA arriving at end of session to speak with pt/spouse. Following commands: Intact      Cueing Cueing Techniques: Verbal cues  Exercises Total Joint Exercises Ankle Circles/Pumps: AROM, Both, 10 reps, Seated Quad  Sets: AROM, Both, 5 reps, Supine (>5 reps) Heel Slides: AAROM, Left, 5 reps, Supine (~7 reps) Hip ABduction/ADduction: AAROM, Left, Seated (a few reps for teachback with leg in reclined posture) Straight Leg Raises: AAROM, Left, Supine (a few reps while repositioning, pt unable to lift whole limb without AA) Long Arc Quad: AROM, Left, 5 reps, Seated Knee Flexion:  (appears WFL, pt requested PTA assist her to don her L hinged knee brace for comfort in supine prior to getting up.)    General Comments General comments (skin integrity, edema, etc.): BP checked 86/58 in recliner with ~35* head angle in semi-reclined posture with legs up HR 68 bpm SpO2 97-98% on RA; BP checked again with head fully reclined in chair a few mins later and BP 91/59 (69) HR 70 bpm, RN and member of surgery team also present assisting her as PTA left room. PTA recommends RN obtain bil TED hose for her as this was in pt's orders previous date and hopefully will help with her hemodynamic stability. Pt encouraged to rest prior to getting up with staff assist given presyncopal episode, pale pallor when PTA arrived  and clammy/diaphoretic appearance prior to transfer to recliner. Pallor improving while pt in recliner and resting, and pt more alert at end of session.      Pertinent Vitals/Pain Pain Assessment Pain Assessment: Faces Pain Score: 10-Worst pain ever Faces Pain Scale: Hurts even more Pain Location: L hip with activity Pain Descriptors / Indicators: Grimacing, Sore, Operative site guarding, Discomfort, Sharp Pain Intervention(s): Limited activity within patient's tolerance, Monitored during session, Premedicated before session, Repositioned, Ice applied    Home Living   Living Arrangements: Spouse/significant other                      Prior Function            PT Goals (current goals can now be found in the care plan section) Acute Rehab PT Goals Patient Stated Goal: return home once I am feeling  better and VSS PT Goal Formulation: With patient Time For Goal Achievement: 04/11/23 Progress towards PT goals: Progressing toward goals (slow progress due to BP drop limiting activity)    Frequency    7X/week      PT Plan      Co-evaluation              AM-PAC PT "6 Clicks" Mobility   Outcome Measure  Help needed turning from your back to your side while in a flat bed without using bedrails?: A Little Help needed moving from lying on your back to sitting on the side of a flat bed without using bedrails?: A Little Help needed moving to and from a bed to a chair (including a wheelchair)?: A Little Help needed standing up from a chair using your arms (e.g., wheelchair or bedside chair)?: A Lot (from lower surface) Help needed to walk in hospital room?: A Little Help needed climbing 3-5 steps with a railing? : A Lot 6 Click Score: 16    End of Session Equipment Utilized During Treatment: Gait belt;Other (comment) (pt personal L hinged knee brace (for comfort per her request)) Activity Tolerance: Patient tolerated treatment well Patient left: in chair;with call bell/phone within reach;with family/visitor present (pt on BSC over toilet in bathroom, spouse present, pt requesting her crossword puzzle and anticipates >10 mins needed for toileting, RN/NT notified) Nurse Communication: Mobility status;Other (comment) (assist level needed, pt may need help leaving bathroom if it takes >10 mins, spouse in bathroom with her and was able to provide physical assist as well after instruction on guarding with gait belt) PT Visit Diagnosis: Other abnormalities of gait and mobility (R26.89);Difficulty in walking, not elsewhere classified (R26.2);Pain Pain - Right/Left: Left Pain - part of body: Hip     Time: 4098-1191 PT Time Calculation (min) (ACUTE ONLY): 14 min  Charges:    $Therapeutic Activity: 8-22 mins PT General Charges $$ ACUTE PT VISIT: 1 Visit                     Kymberly Blomberg  P., PTA Acute Rehabilitation Services Secure Chat Preferred 9a-5:30pm Office: [phone]    Dorathy Kinsman [facility] 04/05/2023, 12:27 PM

## 2023-04-05 NOTE — Anesthesia Postprocedure Evaluation (Signed)
 Anesthesia Post Note  Patient: DOMENICA WEIGHTMAN  Procedure(s) Performed: LEFT TOTAL HIP ARTHROPLASTY ANTERIOR APPROACH (Left: Hip)     Patient location during evaluation: PACU Anesthesia Type: Spinal Level of consciousness: awake Pain management: pain level controlled Vital Signs Assessment: post-procedure vital signs reviewed and stable Respiratory status: spontaneous breathing, nonlabored ventilation and respiratory function stable Cardiovascular status: blood pressure returned to baseline and stable Postop Assessment: no apparent nausea or vomiting Anesthetic complications: no   No notable events documented.  Last Vitals:  Vitals:   04/05/23 1143 04/05/23 1432  BP: (!) 91/59 (!) 107/51  Pulse: 70 79  Resp: 16   Temp:  36.7 C  SpO2: 96% 100%    Last Pain:  Vitals:   04/05/23 1806  TempSrc:   PainSc: 10-Worst pain ever   Pain Goal: Patients Stated Pain Goal: 3 (04/05/23 0003)                 Catheryn Bacon [place]

## 2023-04-05 NOTE — Progress Notes (Signed)
-  s/p Left total hip arthroplasty, 3/10   04/05/23 1023  TOC Brief Assessment  Insurance and Status Reviewed  Patient has primary care physician Yes  Home environment has been reviewed From home with husband.  Prior level of function: PTA independent with ADL's.  Prior/Current Home Services No current home services  Social Drivers of Health Review SDOH reviewed no interventions necessary  Readmission risk has been reviewed No  Transition of care needs transition of care needs identified, TOC will continue to follow      Home health services will be provided by [facility]  prearranged by provider's office if needed. Pt without DME needs, has RW, CANE and ramp @ home.  Referral made with [facility] / [employer] for [facility]. Equipment will be delivered to bedside prior to d/c. Pt without RX med concerns or transportation issues. TOC team following and will assist with needs... Gae Gallop RN,BSN,CM [phone]

## 2023-04-06 DIAGNOSIS — M1612 Unilateral primary osteoarthritis, left hip: Secondary | ICD-10-CM | POA: Diagnosis not present

## 2023-04-06 NOTE — Progress Notes (Addendum)
 Physical Therapy Treatment Patient Details Name: Donna Rios MRN: [id number] DOB: 06-15-1950 Today's Date: 04/06/2023   History of Present Illness Pt is 73 year old presented to [facility] on 04/04/23 for L THR. PMH - arthritis, TAVR, HTN, COPD.    PT Comments  Pt received in chair, agreeable to therapy session, pt states she deferred PO pain meds prior to session as she was concerned they were making her foggy; pt appears much more alert in PM session. Pt still with very slow processing, at times needing 2-3 whole minutes standing upright before taking a step, and states "I just don't want to be rushed", but reports her pain is a little better after mobilizing in room. PTA had planned to have her walk typical household distance and perform curb step to simulate home entry however pt c/o fatigue/woozy sensation after ~53ft so pt sat down in chair and wheeled back to bedside. Pt needed ~40 mins to walk 43ft, including donning LLE brace and briefs and BP checks x3. BP WFL and SpO2/HR WFL on RA. Pt needing dense sequencing cues for RW management and consistent minA +2 for safety (and chair follow). Pt spouse states he isn't comfortable with her going home yet at her current level, asking what their options are post-acute, RN notified. Pt continues to benefit from PT services to progress toward functional mobility goals.    If plan is discharge home, recommend the following: A little help with bathing/dressing/bathroom;Assistance with cooking/housework;Assist for transportation;Help with stairs or ramp for entrance;A lot of help with walking and/or transfers   Can travel by private vehicle        Equipment Recommendations  BSC/3in1;Other (comment) (pt reports she has RW and rollator; BSC already delivered to her room)    Recommendations for Other Services OT consult (further cognitive work-up, still foggy, not sure if due to pain meds or other issue)     Precautions / Restrictions  Precautions Precautions: Fall Recall of Precautions/Restrictions: Intact Precaution/Restrictions Comments: Pt agrees to press call bell prior to getting up unassisted; spouse present and also agreeable to help her call prior to her getting up. Required Braces or Orthoses: Other Brace Other Brace: pt has L hinged knee brace for comfort; TED hose already in place prior to getting up Restrictions Weight Bearing Restrictions Per Provider Order: Yes LLE Weight Bearing Per Provider Order: Weight bearing as tolerated     Mobility  Bed Mobility Overal bed mobility: Needs Assistance Bed Mobility: Rolling, Sidelying to Sit Rolling: Min assist Sidelying to sit: Mod assist       General bed mobility comments: pt received up in recliner, remains up in chair at end of session.    Transfers Overall transfer level: Needs assistance Equipment used: Rolling walker (2 wheels) Transfers: Sit to/from Stand Sit to Stand: Min assist           General transfer comment: From recliner<>RW, chair follow during gait trial and pt sat down when fatigued and wheeled back into her room.    Ambulation/Gait Ambulation/Gait assistance: Min assist, +2 safety/equipment Gait Distance (Feet): 25 Feet Assistive device: Rolling walker (2 wheels) Gait Pattern/deviations: Decreased stance time - left, Trunk flexed, Step-to pattern, Step-through pattern, Decreased stride length, Decreased dorsiflexion - left, Knee flexed in stance - left Gait velocity: decr     General Gait Details: Verbal cues for initial sequencing/step pattern and pt needing consistent minA again in afternoon to manage RW for forward and pivotal stepping, with cues for upright posture and not  to lean forward over RW. Spouse present and assisting via gait belt, PTA giving visual/verbal cues and assisting at times with RW placement/safety cues and chair follow. Chair pulled up when pt c/o "woozy" feeling although BP appears stable. Pt c/o L elbow  pain when using RW heavily, PTA discussed it may be related to recent heavy use of RW with UE.   Stairs Stairs:  (pt defers until following date)           Wheelchair Mobility     Tilt Bed    Modified Rankin (Stroke Patients Only)       Balance Overall balance assessment: Mild deficits observed, not formally tested Sitting-balance support: Feet supported, Single extremity supported Sitting balance-Leahy Scale: Fair     Standing balance support: Bilateral upper extremity supported, During functional activity, Reliant on assistive device for balance Standing balance-Leahy Scale: Poor Standing balance comment: Heavy reliance on RW, needs external support for safety.                            Communication Communication Communication: No apparent difficulties  Cognition Arousal: Alert Behavior During Therapy: Flat affect   PT - Cognitive impairments: Sequencing, Problem solving, Safety/Judgement                       PT - Cognition Comments: Pt more alert in PM but still slow to initiate steps during gait trial but with improved attention today during transfers; pt deferred opioid pain meds (conversation w/RN) prior to getting up due to lethargy in AM and RN arriving to discuss pain medication with her at end of session. Very slow pace throughout. Following commands: Intact      Cueing Cueing Techniques: Verbal cues, Gestural cues  Exercises Total Joint Exercises Ankle Circles/Pumps: AROM, Both, 10 reps, Supine Long Arc Quad: Left, Seated, AAROM (a few reps seated, pt needs AAROM to perform.) Other Exercises Other Exercises: reviewed IS frequency with pt/spouse, she agrees to try 5-10 reps per hour while awake.    General Comments General comments (skin integrity, edema, etc.): BP 119/68 (83) standing; BP 124/67 (80) sitting in recliner just post-exertion; mild c/o wooziness at end of standing trial but BP was stable      Pertinent  Vitals/Pain Pain Assessment Pain Assessment: Faces Pain Score: 7  Faces Pain Scale: Hurts even more Pain Location: L hip and thigh and L elbow with activity Pain Descriptors / Indicators: Grimacing, Sore, Operative site guarding, Discomfort, Sharp Pain Intervention(s): Limited activity within patient's tolerance, Monitored during session, Repositioned, Patient requesting pain meds-RN notified, Ice applied (pt states LLE pain a little better after ambulation but L elbow pain increased (elbow pain likely due to increased RW use this week))    Home Living                          Prior Function            PT Goals (current goals can now be found in the care plan section) Acute Rehab PT Goals Patient Stated Goal: return home PT Goal Formulation: With patient Time For Goal Achievement: 04/11/23 Progress towards PT goals: Progressing toward goals    Frequency    7X/week      PT Plan      Co-evaluation              AM-PAC PT "6 Clicks" Mobility  Outcome Measure  Help needed turning from your back to your side while in a flat bed without using bedrails?: A Little Help needed moving from lying on your back to sitting on the side of a flat bed without using bedrails?: A Lot (no rails) Help needed moving to and from a bed to a chair (including a wheelchair)?: A Little Help needed standing up from a chair using your arms (e.g., wheelchair or bedside chair)?: A Little Help needed to walk in hospital room?: A Little Help needed climbing 3-5 steps with a railing? : Total 6 Click Score: 15    End of Session Equipment Utilized During Treatment: Gait belt;Other (comment) (bil thigh high TED hose, L hinged knee brace for comfort) Activity Tolerance: Patient limited by fatigue;Patient limited by pain Patient left: in chair;with call bell/phone within reach;with family/visitor present;with chair alarm set (iceman to L hip (ice refilled)) Nurse Communication: Mobility  status;Other (comment) (still with slow processing, not at [facility]; may be related to pain meds? pt more alert in PM when she did not take PO meds prior to mobilizing but still moving very very slowly) PT Visit Diagnosis: Other abnormalities of gait and mobility (R26.89);Difficulty in walking, not elsewhere classified (R26.2);Pain Pain - Right/Left: Left Pain - part of body: Hip     Time: 1308-6578 PT Time Calculation (min) (ACUTE ONLY): 39 min  Charges:    $Gait Training: 23-37 mins $Therapeutic Activity: 8-22 mins PT General Charges $$ ACUTE PT VISIT: 1 Visit                     Kenetra Hildenbrand P., PTA Acute Rehabilitation Services Secure Chat Preferred 9a-5:30pm Office: [phone]    Dorathy Kinsman [facility] 04/06/2023, 4:20 PM

## 2023-04-06 NOTE — Progress Notes (Signed)
 Physical Therapy Treatment Patient Details Name: Donna Rios MRN: [id number] DOB: Mar 28, 1950 Today's Date: 04/06/2023   History of Present Illness Pt is 73 year old presented to [facility] on 04/04/23 for L THR. PMH - arthritis, TAVR, HTN, COPD.    PT Comments  Pt received in supine, c/o fatigue and severe pain prior to OOB but agreeable to therapy session and her spouse present and able to assist PRN. Pt needing up to modA for bed mobility from flat bed with no rails per home set-up and increased time/effort. Pt performs transfers from elevated bed and BSC with increased time and up to minA lift/steadying assist, and needing consistent minA for safety/steadying and RW management this date as pt remains slightly lethargic and with evolving symptoms of orthostatic hypotension (pale pallor, c/o subjective fatigue and LE, lower back weakness. Pt color improves once reclined in chair, BP soft, RN notified. Bil TED hose remain in place during session. Pt continues to benefit from PT services to progress toward functional mobility goals, pt not yet safe to DC home, will need to practice stairs and tolerate household distance ambulation tasks without orthostatic hypotension signs/symptoms prior to DC home with spouse assist.     If plan is discharge home, recommend the following: A little help with bathing/dressing/bathroom;Assistance with cooking/housework;Assist for transportation;Help with stairs or ramp for entrance;A lot of help with walking and/or transfers   Can travel by private vehicle        Equipment Recommendations  BSC/3in1;Other (comment) (pt reports she has RW and rollator; BSC already delivered to her room)    Recommendations for Other Services OT consult (further cognitive work-up, still foggy, not sure if due to pain meds or other issue)     Precautions / Restrictions Precautions Precautions: Fall Recall of Precautions/Restrictions: Intact Precaution/Restrictions Comments: Pt agrees  to press call bell prior to getting up unassisted; spouse present and also agreeable to help her call prior to her getting up. Required Braces or Orthoses: Other Brace Other Brace: pt has L hinged knee brace for comfort but did not ask to don it this session; thigh-high TED hose already in place prior to OOB Restrictions Weight Bearing Restrictions Per Provider Order: Yes LLE Weight Bearing Per Provider Order: Weight bearing as tolerated     Mobility  Bed Mobility Overal bed mobility: Needs Assistance Bed Mobility: Rolling, Sidelying to Sit Rolling: Min assist Sidelying to sit: Mod assist       General bed mobility comments: Pt already shifted to the R side of the bed, so pt cued for partial log roll toward her R side to sit up when not using bed rails and HOB nearly flat (per home set-up), pt having increased difficulty today with this technique and needs increased time/effort to perform.    Transfers Overall transfer level: Needs assistance Equipment used: Rolling walker (2 wheels) Transfers: Sit to/from Stand Sit to Stand: Min assist, From elevated surface           General transfer comment: From slightly elevated EOB> RW and RW>recliner, mod sequencing/safety cues and hand over hand assist at times to ensure safe UE placement with pt needing increased time to switch hands between postures.    Ambulation/Gait Ambulation/Gait assistance: Min assist, +2 safety/equipment Gait Distance (Feet): 15 Feet Assistive device: Rolling walker (2 wheels) Gait Pattern/deviations: Decreased stance time - left, Trunk flexed, Step-to pattern Gait velocity: decr     General Gait Details: Assist for safety. Verbal cues for initial sequencing and pt needing  consistent minA today to manage RW for forward and pivotal stepping, with cues for upright posture and not to lean forward over RW. Spouse present and providing CGA for safety at times and pt stayed close to chair surfaces due to c/o  fatigue, pt with notably pale pallor and c/o BLE and lower back sensation of weakness prior to taking seated break. Pt c/o significant fatigue once in chair and BP soft, see comments.   Stairs Stairs:  (defer, pt with symptoms of low BP with initial gait trial so defer for her safety.)           Wheelchair Mobility     Tilt Bed    Modified Rankin (Stroke Patients Only)       Balance Overall balance assessment: Mild deficits observed, not formally tested Sitting-balance support: Feet supported, Single extremity supported Sitting balance-Leahy Scale: Fair     Standing balance support: Bilateral upper extremity supported, During functional activity, Reliant on assistive device for balance Standing balance-Leahy Scale: Poor Standing balance comment: heavy reliance on RW, needs external support for safety, esp when performing peri-care after toileting.                            Communication Communication Communication: No apparent difficulties  Cognition Arousal: Lethargic Behavior During Therapy: Flat affect   PT - Cognitive impairments: Memory, Attention, Initiation, Sequencing, Problem solving, Safety/Judgement                       PT - Cognition Comments: Pt slow to initiate and to switch attention from one task to another. Pt with slower processing noted as standing time increased and becoming pale, c/o fatigue and mild lightheadedness, BP softer sitting in chair post-exertion than before gait trial so may have been related. Pt states her memory of previous day's events is not quite the same as what staff tell her, and spouse is very attentive and able to provide a lot of cues/physical assist as needed. RN notified of pt slower processing/possible orthostatic symptoms with short gait trial so they can use step pivot for toileting PRN. Following commands: Intact      Cueing Cueing Techniques: Verbal cues, Tactile cues, Gestural cues  Exercises  Total Joint Exercises Ankle Circles/Pumps: AROM, Both, 10 reps, Supine Long Arc Quad: AROM, Left, 5 reps, Seated (cues to perform prior to standing up) Other Exercises Other Exercises: reviewed IS frequency with pt/spouse, she agrees to try 5-10 reps per hour while awake.    General Comments General comments (skin integrity, edema, etc.): see BP in comments      Pertinent Vitals/Pain Pain Assessment Pain Assessment: Faces Faces Pain Scale: Hurts even more Pain Location: L hip with activity Pain Descriptors / Indicators: Grimacing, Sore, Operative site guarding, Discomfort, Sharp Pain Intervention(s): Limited activity within patient's tolerance, Monitored during session, Premedicated before session, Repositioned, Ice applied    Home Living                          Prior Function            PT Goals (current goals can now be found in the care plan section) Acute Rehab PT Goals Patient Stated Goal: return home PT Goal Formulation: With patient Time For Goal Achievement: 04/11/23 Progress towards PT goals: Progressing toward goals    Frequency    7X/week      PT Plan  Co-evaluation              AM-PAC PT "6 Clicks" Mobility   Outcome Measure  Help needed turning from your back to your side while in a flat bed without using bedrails?: A Little Help needed moving from lying on your back to sitting on the side of a flat bed without using bedrails?: A Lot (no rails) Help needed moving to and from a bed to a chair (including a wheelchair)?: A Little Help needed standing up from a chair using your arms (e.g., wheelchair or bedside chair)?: A Little Help needed to walk in hospital room?: A Little Help needed climbing 3-5 steps with a railing? : Total 6 Click Score: 15    End of Session Equipment Utilized During Treatment: Gait belt;Other (comment) (bil thigh high TED hose) Activity Tolerance: Patient limited by fatigue;Patient limited by pain Patient  left: in chair;with call bell/phone within reach;with family/visitor present;with chair alarm set (iceman to L hip (ice refilled)) Nurse Communication: Mobility status;Other (comment) (still with orthostatic hypotension symptoms today) PT Visit Diagnosis: Other abnormalities of gait and mobility (R26.89);Difficulty in walking, not elsewhere classified (R26.2);Pain Pain - Right/Left: Left Pain - part of body: Hip     Time: 1610-9604 PT Time Calculation (min) (ACUTE ONLY): 41 min  Charges:    $Gait Training: 8-22 mins $Therapeutic Activity: 23-37 mins PT General Charges $$ ACUTE PT VISIT: 1 Visit                     Dearion Huot P., PTA Acute Rehabilitation Services Secure Chat Preferred 9a-5:30pm Office: [phone]    Dorathy Kinsman [facility] 04/06/2023, 2:13 PM

## 2023-04-06 NOTE — Progress Notes (Signed)
  Subjective: Donna Rios is a 73 y.o. female s/p left THA.  They are POD 2.  Pt's pain is moderate but controlled and somewhat improved compared with yesterday.  Pt has ambulated with moderate difficulty.  Has not ambulated yet today.  No persistent headache, chest pain, shortness of breath, palpitations.  She does feel some fatigue.  Hemoglobin was 8.7 yesterday.   Objective: Vital signs in last 24 hours: Temp:  [98.1 F (36.7 C)-98.7 F (37.1 C)] 98.4 F (36.9 C) (03/12 0725) Pulse Rate:  [68-104] 104 (03/12 0725) Resp:  [16-20] 17 (03/12 0431) BP: (86-120)/(51-68) 120/62 (03/12 0725) SpO2:  [90 %-100 %] 96 % (03/12 0725)  Intake/Output from previous day: 03/11 0701 - 03/12 0700 In: 480 [P.O.:480] Out: 400 [Urine:400] Intake/Output this shift: No intake/output data recorded.  Exam:  No gross blood or drainage overlying the dressing 2+ DP pulse Sensation intact distally in the left foot Able to dorsiflex and plantarflex the left foot   Labs: Recent Labs    04/05/23 1226  HGB 8.7*   Recent Labs    04/05/23 1226  WBC 13.9*  RBC 2.70*  HCT 25.2*  PLT 127*   No results for input(s): "NA", "K", "CL", "CO2", "BUN", "CREATININE", "GLUCOSE", "CALCIUM" in the last 72 hours. No results for input(s): "LABPT", "INR" in the last 72 hours.  Assessment/Plan: Pt is POD 2 s/p left THA.    -Plan to discharge to home today or tomorrow pending patient's pain and PT eval.  We will see how physical therapy goes today and I will give her a call later today to see how she feels about discharging home.  If she still struggles, may have to hold until tomorrow  -WBAT with a walker  -Follow-up 2 weeks postoperatively with Dr. August Saucer or myself   Karenann Cai 04/06/2023, 8:44 AM

## 2023-04-06 NOTE — Plan of Care (Signed)

## 2023-04-07 DIAGNOSIS — M1612 Unilateral primary osteoarthritis, left hip: Secondary | ICD-10-CM | POA: Diagnosis not present

## 2023-04-07 NOTE — TOC Progression Note (Addendum)
 Transition of Care [facility]) - Progression Note    Patient Details  Name: Donna Rios MRN: [id number] Date of Birth: [birth year]  Transition of Care [facility]) CM/SW Contact  Dellie Burns [place], [place] Phone Number: 04/07/2023, 11:05 AM  Clinical Narrative: Spoke with pt's spouse who is now requesting SNF placement due to inadequate caregiver support at home. Reviewed SNF placement process and answered questions. Will f/u with offers as available.   UPDATE 1520: Provided current SNF offers to pt and pt's spouse at bedside. Will f/u on facility choice and start auth.  Dellie Burns, MSW, LCSW [phone] (coverage)             Expected Discharge Plan and Services   Discharge Planning Services: CM Consult                     DME Arranged: Bedside commode DME Agency: [employer], [employer] Date DME Agency Contacted: 04/05/23 Time DME Agency Contacted: 1040 Representative spoke with at DME Agency: Vaughan Basta             Social Determinants of Health (SDOH) Interventions SDOH Screenings   Food Insecurity: No Food Insecurity (04/05/2023)  Housing: Low Risk  (04/05/2023)  Transportation Needs: No Transportation Needs (04/05/2023)  Utilities: Not At Risk (04/05/2023)  Alcohol Screen: Low Risk  (09/16/2022)  Depression (PHQ2-9): Low Risk  (11/26/2022)  Financial Resource Strain: Low Risk  (09/16/2022)  Physical Activity: Inactive (09/16/2022)  Social Connections: Moderately Isolated (04/05/2023)  Stress: No Stress Concern Present (09/16/2022)  Tobacco Use: High Risk (04/04/2023)  Health Literacy: Adequate Health Literacy (09/16/2022)    Readmission Risk Interventions     No data to display

## 2023-04-07 NOTE — Progress Notes (Signed)
 Assumed care @ 0000. Pt c/o increasing pain overnight with movement. Pt required 2 x doses IV dilaudid as oxycodone was ineffective at controlling pain. Pt feels the pain medication makes her 'woozy' but required increased pain medication for uncontrolled pain. Pt requires a long amount of time to mobilize and requires frequent cues on proper movements to sit up, walk with walker, turn and sit on BSC or bed. Pt does not feel her husband will be able to take care of her at this point. Pt states she has significant pain in knees (baseline) that inhibit movement. Pt unable to extend BLE and continues to keep legs flexed in bed as it is more comfortable. Iceman remained on L hip overnight.
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
## 2023-04-07 NOTE — Progress Notes (Signed)
 Physical Therapy Treatment Patient Details Name: Donna Rios MRN: [id number] DOB: 1950-12-31 Today's Date: 04/07/2023   History of Present Illness Pt is 73 year old presented to [facility] on 04/04/23 for L THR. PMH - arthritis, TAVR, HTN, COPD.    PT Comments  Pt received in recliner after transferring to/from BSC>chair with mobility specialist. Pt c/o fatigue but agreeable to seated/reclined LLE exercise instruction with spouse also present for teachback on safe technique. Pt with improved ROM using plastic bag under her LLE to perform hip abduction and heel slide exercises but still needing greatly increased time to initiate and perform, cues for pursed-lip breathing and AA for improved ROM on most exercises. Pt defers standing/gait due to fatigue after exercises performed, iceman refilled for pt pain/edema relief while up in chair. Pt continues to benefit from PT services to progress toward functional mobility goals, she is not able to perform functional mobility in a safe amount of time and spouse agrees he is not yet able to manage her care needs at home, pt motivated to progress and remains a good candidate for short term lower intensity post-acute rehab prior to DC home if surgeon in agreement.     If plan is discharge home, recommend the following: A little help with bathing/dressing/bathroom;Assistance with cooking/housework;Assist for transportation;Help with stairs or ramp for entrance;A lot of help with walking and/or transfers   Can travel by private vehicle        Equipment Recommendations  BSC/3in1;Other (comment) (pt reports she has RW and rollator; BSC already delivered to her room)    Recommendations for Other Services OT consult (further cognitive work-up, still foggy, not sure if due to pain meds or other issue)     Precautions / Restrictions Precautions Precautions: Fall Recall of Precautions/Restrictions: Intact Precaution/Restrictions Comments: Pt agrees to press call  bell prior to getting up unassisted; spouse present and also agreeable to help her call prior to her getting up. Required Braces or Orthoses: Other Brace Other Brace: pt has L hinged knee brace for comfort, not needing it in the chair; TED hose already in place prior to getting up Restrictions Weight Bearing Restrictions Per Provider Order: Yes LLE Weight Bearing Per Provider Order: Weight bearing as tolerated     Mobility  Bed Mobility Overal bed mobility: Needs Assistance             General bed mobility comments: pt received up in recliner, remains up in chair at end of session.    Transfers Overall transfer level: Needs assistance                 General transfer comment: Pt defers, she just got back to chair from getting up to [facility] and c/o fatigue and mild "woozy" sensation; pt reports she did not have pain meds recently since she had IV pain meds early in AM.    Ambulation/Gait               General Gait Details: pt received in chair, she defers; fatigue. Instead emphasis on supine/seated LE HEP   Stairs             Wheelchair Mobility     Tilt Bed    Modified Rankin (Stroke Patients Only)       Balance Overall balance assessment: Mild deficits observed, not formally tested Sitting-balance support: Feet supported, Single extremity supported Sitting balance-Leahy Scale: Fair Sitting balance - Comments: sitting forward in chair       Standing balance comment:  pt defers -fatigue                            Communication Communication Communication: No apparent difficulties  Cognition Arousal: Alert Behavior During Therapy: Flat affect   PT - Cognitive impairments: Sequencing, Problem solving, Safety/Judgement                       PT - Cognition Comments: Pt alert initially, having just transferred to chair from getting up to [facility], pt c/o fatigue but agreeable to work on supine/seated LE exercises. Very slow to  perform and decreased carryover of cues from previous session HEP instruction. Spouse present and also receptive to instruction. Pt closing her eyes more toward end of session but still hearing and responding to cues/conversation. Following commands: Intact      Cueing Cueing Techniques: Verbal cues, Gestural cues, Tactile cues  Exercises Total Joint Exercises Ankle Circles/Pumps: AROM, Both, 10 reps, Supine Quad Sets: AROM, Both, 10 reps, Supine Towel Squeeze: AROM, 5 reps, Supine Short Arc Quad: AROM, AAROM, Left, 10 reps, Supine Heel Slides: Supine, 10 reps, Left, AAROM, AROM (plastic bag under her LLE, some assist for increased ROM) Hip ABduction/ADduction: AAROM, Left, Supine, 10 reps (AA for increased ROM, pt able to perform part of motion with plastic bag under her L heel/ankle) Long Arc Quad: Left, Seated, AAROM, AROM, 10 reps (AA to reach TKE, pt able to perform 50-70% of range with AROM) Knee Flexion: AROM, Left, Seated, 10 reps Marching in Standing:  (pt defer, fatigued) Other Exercises Other Exercises: reviewed IS frequency with pt/spouse, she agrees to try 5-10 reps per hour while awake.    General Comments        Pertinent Vitals/Pain Pain Assessment Pain Assessment: 0-10 Faces Pain Scale: Hurts even more Pain Location: L hip and thigh with supine/seated LLE exercises Pain Descriptors / Indicators: Grimacing, Sore, Operative site guarding, Discomfort, Sharp Pain Intervention(s): Limited activity within patient's tolerance, Monitored during session, Repositioned, Ice applied    Home Living                          Prior Function            PT Goals (current goals can now be found in the care plan section) Acute Rehab PT Goals Patient Stated Goal: return home PT Goal Formulation: With patient Time For Goal Achievement: 04/11/23 Progress towards PT goals: Progressing toward goals    Frequency    7X/week      PT Plan      Co-evaluation               AM-PAC PT "6 Clicks" Mobility   Outcome Measure  Help needed turning from your back to your side while in a flat bed without using bedrails?: A Little Help needed moving from lying on your back to sitting on the side of a flat bed without using bedrails?: A Lot (no rails) Help needed moving to and from a bed to a chair (including a wheelchair)?: A Little Help needed standing up from a chair using your arms (e.g., wheelchair or bedside chair)?: A Little Help needed to walk in hospital room?: A Lot (chair follow-safety) Help needed climbing 3-5 steps with a railing? : Total 6 Click Score: 14    End of Session Equipment Utilized During Treatment: Gait belt;Other (comment) (bil thigh high TED hose, gait belt PRN  as leg lifter) Activity Tolerance: Patient limited by fatigue;Patient limited by pain Patient left: in chair;with call bell/phone within reach;with family/visitor present;with chair alarm set (iceman to L hip (ice refilled)) Nurse Communication: Mobility status;Other (comment) (still with slow processing, not at [facility]) PT Visit Diagnosis: Other abnormalities of gait and mobility (R26.89);Difficulty in walking, not elsewhere classified (R26.2);Pain Pain - Right/Left: Left Pain - part of body: Hip     Time: 1478-2956 PT Time Calculation (min) (ACUTE ONLY): 42 min  Charges:    $Therapeutic Exercise: 23-37 mins $Therapeutic Activity: 8-22 mins PT General Charges $$ ACUTE PT VISIT: 1 Visit                     Special Ranes P., PTA Acute Rehabilitation Services Secure Chat Preferred 9a-5:30pm Office: [phone]    Dorathy Kinsman [facility] 04/07/2023, 1:04 PM

## 2023-04-07 NOTE — Progress Notes (Signed)
  Subjective: Patient stable.  Reporting pain with ambulation.  Not sure her husband can take care of her at home.   Objective: Vital signs in last 24 hours: Temp:  [97.7 F (36.5 C)-98.8 F (37.1 C)] 98.8 F (37.1 C) (03/13 0425) Pulse Rate:  [82-93] 93 (03/13 0425) Resp:  [18] 18 (03/13 0425) BP: (102-120)/(54-78) 120/56 (03/13 0425) SpO2:  [96 %-99 %] 96 % (03/13 0425)  Intake/Output from previous day: 03/12 0701 - 03/13 0700 In: 480 [P.O.:480] Out: 800 [Urine:800] Intake/Output this shift: No intake/output data recorded.  Exam:  Sensation intact distally Intact pulses distally Dorsiflexion/Plantar flexion intact  Labs: Recent Labs    04/05/23 1226  HGB 8.7*   Recent Labs    04/05/23 1226  WBC 13.9*  RBC 2.70*  HCT 25.2*  PLT 127*   No results for input(s): "NA", "K", "CL", "CO2", "BUN", "CREATININE", "GLUCOSE", "CALCIUM" in the last 72 hours. No results for input(s): "LABPT", "INR" in the last 72 hours.  Assessment/Plan: Plan at this time is social work consult for skilled nursing placement.  Essentially she is medically stable.  Needs to either go home with her husband and continue mobilization with home health care or go to skilled nursing.  We discussed at length this morning.   Donna Rios 04/07/2023, 7:53 AM

## 2023-04-07 NOTE — Progress Notes (Signed)
 Mobility Specialist Progress Note:    04/07/23 1100  Mobility  Activity Transferred to/from [facility];Transferred from bed to chair  Level of Assistance Minimal assist, patient does 75% or more  Assistive Device Front wheel walker  Distance Ambulated (ft) 10 ft (4+6)  LLE Weight Bearing Per Provider Order WBAT  Activity Response Tolerated well  Mobility Referral Yes  Mobility visit 1 Mobility  Mobility Specialist Start Time (ACUTE ONLY) 1032  Mobility Specialist Stop Time (ACUTE ONLY) 1056  Mobility Specialist Time Calculation (min) (ACUTE ONLY) 24 min   Pt received in bed and agreeable. Requested assistance to [facility]. Void successful. Performed peri care independently. Able to stand and ambulate to chair w/ cues and minA. Pt left in chair w/ family and PTA present.  D'Vante Earlene Plater Mobility Specialist Please contact via Special educational needs teacher or Rehab office at [phone]

## 2023-04-07 NOTE — Plan of Care (Signed)
  Problem: Clinical Measurements: Goal: Ability to maintain clinical measurements within normal limits will improve Outcome: Progressing   Problem: Activity: Goal: Risk for activity intolerance will decrease Outcome: Progressing   Problem: Nutrition: Goal: Adequate nutrition will be maintained Outcome: Progressing   Problem: Coping: Goal: Level of anxiety will decrease Outcome: Progressing   Problem: Pain Managment: Goal: General experience of comfort will improve and/or be controlled Outcome: Progressing   Problem: Clinical Measurements: Goal: Ability to maintain clinical measurements within normal limits will improve Outcome: Progressing   Problem: Activity: Goal: Risk for activity intolerance will decrease Outcome: Progressing   Problem: Nutrition: Goal: Adequate nutrition will be maintained Outcome: Progressing   Problem: Coping: Goal: Level of anxiety will decrease Outcome: Progressing   Problem: Pain Managment: Goal: General experience of comfort will improve and/or be controlled Outcome: Progressing   Problem: Elimination: Goal: Will not experience complications related to bowel motility 04/07/2023 1847 by Gracelyn Nurse, RN Outcome: Not Progressing 04/07/2023 1846 by Gracelyn Nurse, RN Outcome: Not Progressing  Patient unable to have BM tried multiple times throughout day Dr. August Saucer notified pt on colace and miralax scheduled.

## 2023-04-08 ENCOUNTER — Telehealth: Payer: Self-pay

## 2023-04-08 DIAGNOSIS — M1612 Unilateral primary osteoarthritis, left hip: Secondary | ICD-10-CM | POA: Diagnosis not present

## 2023-04-08 MED ORDER — MAGNESIUM CITRATE PO SOLN
1.0000 | Freq: Once | ORAL | Status: DC
  Filled 2023-04-08: qty 296

## 2023-04-08 MED ORDER — DOCUSATE SODIUM 100 MG PO CAPS: 100.0000 mg | ORAL_CAPSULE | Freq: Two times a day (BID) | ORAL | 0 refills | Status: AC

## 2023-04-08 MED ORDER — OXYCODONE HCL 5 MG PO TABS: 5.0000 mg | ORAL_TABLET | ORAL | 0 refills | Status: DC | PRN

## 2023-04-08 MED ORDER — METHOCARBAMOL 500 MG PO TABS: 500.0000 mg | ORAL_TABLET | Freq: Three times a day (TID) | ORAL | 0 refills | Status: DC | PRN

## 2023-04-08 NOTE — Progress Notes (Signed)
 Physical Therapy Treatment Patient Details Name: Donna Rios MRN: [id number] DOB: [birth year] Today's Date: 04/08/2023     History of Present Illness Pt is 73 year old presented to [facility] on 04/04/23 for L THR. PMH - arthritis, TAVR, HTN, COPD.      PTA Comments   Pt received in supine, c/o fatigue after recent return to supine from chair. Pt spouse present and had questions about likely progression of therapies, PTA provided information about likely progression of therapies, < 3 hours per day likely at least 5 days per week in post-acute skilled nursing facility setting. Discussed benefits of frequent mobility, pressure relief frequency, use of ice PRN for pain mgmt. Spouse receptive, pt distracted and asking a lot of questions about pain medications, PTA defers these questions to her provider who was present during session. Pt agreeable to mobilize again later in the day after rest break. When PTA reattempts later in the day, pt preparing to DC to post-acute setting. Pt continues to benefit from PT services to progress toward functional mobility goals.        Subjective Data  Patient Stated Goal return home  Precautions  Precautions Fall  Recall of Precautions/Restrictions Intact  Precaution/Restrictions Comments Pt agrees to press call bell prior to getting up unassisted; spouse present and also agreeable to help her call prior to her getting up.  Required Braces or Orthoses Other Brace  Other Brace pt has L hinged knee brace for comfort, not needing it in the chair; TED hose already in place prior to getting up  Restrictions  Weight Bearing Restrictions Per Provider Order Yes  LLE Weight Bearing Per Provider Order WBAT  Pain Assessment  Pain Assessment Faces  Faces Pain Scale 8  Pain Location L hip and thigh at this time; just got back to bed  Pain Descriptors / Indicators Grimacing;Sore;Operative site guarding;Discomfort;Sharp  Pain Intervention(s) Limited activity within patient's  tolerance;Monitored during session;Premedicated before session;Repositioned;Ice applied  Cognition  Arousal Alert  Behavior During Therapy Flat affect  PT - Cognitive impairments Sequencing;Problem solving;Safety/Judgement  PT - Cognition Comments Pt internally distracted and requesting to speak with provider (provider also arrived at beginning of session) so most of session spent with discussion with spouse on likely progression of care from acute care to post-acute PT, brief discussion on post-acute short term rehab likely progression of care prior to home. Pt spouse receptive to information. PTA refilled iceman as pt not yet ready to get back OOB, and discussion on benefits of continued mobility. Pt asking a lot of questions about lidocaine to PTA, with PTA redirecting her questions to provider as this is outside therapist scope of practice.  Following Commands  Following commands Intact  Cueing  Cueing Techniques Verbal cues;Gestural cues;Tactile cues  Communication  Communication No apparent difficulties  Bed Mobility  Overal bed mobility Needs Assistance  Bed Mobility Rolling  Rolling Min assist  General bed mobility comments pt defers OOB due to just return to supine from using bathroom  Transfers  Overall transfer level Needs assistance  General transfer comment Pt defers, she just got back to bed from toileting  Ambulation/Gait  General Gait Details pt refusing at this time, then preparing for DC later in the day when reattempted  Balance  Overall balance assessment Mild deficits observed, not formally tested  Sitting balance - Comments pt defers at time of session  Standing balance comment pt defers -fatigue  General Comments  General comments (skin integrity, edema, etc.) Reviewed pressure relief Q2H  in bed/Q30 mins to 1 hour in chair.  Exercises  Exercises Total Joint  Total Joint Exercises  Ankle Circles/Pumps AROM;Both;10 reps;Supine  Quad Sets AROM;Both;5  reps;Supine (encouraged frequent performance in supine)  Other Exercises  Other Exercises reviewed IS frequency with pt/spouse, she agrees to try 5-10 reps per hour while awake. Iceman refilled, discussed ice frequency/placement with pt and spouse  PT - End of Session  Equipment Utilized During Treatment Gait belt;Other (comment) (Pt refusing TED hose at time of session, SCDs on but not plugged in as pt just got back in bed; had planned to get OOB but then pt defer due to wanting to speak with provider so did not yet replace them.)  Activity Tolerance Patient limited by fatigue;Patient limited by pain  Patient left with call bell/phone within reach;with family/visitor present;in bed;with bed alarm set (iceman to L hip (ice refilled); spouse and provider in the room)  Nurse Communication Mobility status;Other (comment) (still with slow processing, not at PLOF)   PT - Assessment/Plan  PT Visit Diagnosis Other abnormalities of gait and mobility (R26.89);Difficulty in walking, not elsewhere classified (R26.2);Pain  Pain - Right/Left Left  Pain - part of body Hip  PT Frequency (ACUTE ONLY) 7X/week  Recommendations for Other Services OT consult (further cognitive work-up, still foggy, not sure if due to pain meds or other issue)  Follow Up Recommendations Follow physician's recommendations for discharge plan and follow up therapies (pt and spouse asking about post-acute rehab options; PTA defer to surgeon recommendation)  Patient can return home with the following A little help with bathing/dressing/bathroom;Assistance with cooking/housework;Assist for transportation;Help with stairs or ramp for entrance;A lot of help with walking and/or transfers  PT equipment BSC/3in1;Other (comment) (pt reports she has RW and rollator; BSC already delivered to her room)  AM-PAC PT "6 Clicks" Mobility Outcome Measure (Version 2)  Help needed turning from your back to your side while in a flat bed without using  bedrails? 3  Help needed moving from lying on your back to sitting on the side of a flat bed without using bedrails? 2 (no rails)  Help needed moving to and from a bed to a chair (including a wheelchair)? 3  Help needed standing up from a chair using your arms (e.g., wheelchair or bedside chair)? 3  Help needed to walk in hospital room? 2 (chair follow-safety)  Help needed climbing 3-5 steps with a railing?  1  6 Click Score 14  Consider Recommendation of Discharge To: CIR/SNF/LTACH  Progressive Mobility  What is the highest level of mobility based on the progressive mobility assessment? Level 3 (Stands with assist) - Balance while standing  and cannot march in place  Mobility Referral Yes  Activity Turned to right side;Turned to back - supine  PT Goal Progression  Progress towards PT goals Progressing toward goals (slowly)  Acute Rehab PT Goals  PT Goal Formulation With patient  Time For Goal Achievement 04/11/23  PT Time Calculation  PT Start Time (ACUTE ONLY) 1235  PT Stop Time (ACUTE ONLY) 1245  PT Time Calculation (min) (ACUTE ONLY) 10 min  PT General Charges  $$ ACUTE PT VISIT 1 Visit  PT Treatments  $Therapeutic Activity 8-22 mins   Nashae Maudlin P., PTA Acute Rehabilitation Services Secure Chat Preferred 9a-5:30pm Office: [phone]

## 2023-04-08 NOTE — Telephone Encounter (Signed)
Addressed this

## 2023-04-08 NOTE — Discharge Summary (Addendum)
 Physician Discharge Summary      Patient ID: Donna Rios MRN: [id number] DOB/AGE: [birth year] 73 y.o.  Admit date: 04/04/2023 Discharge date: 04/09/2023  Admission Diagnoses:  Principal Problem:   OA (osteoarthritis) of hip Active Problems:   S/P hip replacement, left   Discharge Diagnoses:  Same  Surgeries: Procedure(s): LEFT TOTAL HIP ARTHROPLASTY ANTERIOR APPROACH on 04/04/2023   Consultants:   Discharged Condition: Stable  Hospital Course: Donna Rios is an 73 y.o. female who was admitted 04/04/2023 with a chief complaint of left hip pain, and found to have a diagnosis of left hip osteoarthritis.  They were brought to the operating room on 04/04/2023 and underwent the above named procedures.  Pt awoke from anesthesia without complication and was transferred to the floor. On POD1, patient's pain was overall controlled but she did struggle with ambulation.  She had a vasovagal syncopal episode while on the toilet with stable vital signs afterward.  She had no red flag signs or symptoms aside from the syncopal event throughout her stay.  She had hemoglobin dropped to about 8.7.  Due to her husband not feeling like he can care for her adequately at home and her difficulty with ambulation that persists for several days following surgery, plan for discharge to skilled nursing facility.  Discharge to SNF and patient will follow-up with Dr. August Saucer or myself 2 weeks postoperatively.   Antibiotics given:  Anti-infectives (From admission, onward)    Start     Dose/Rate Route Frequency Ordered Stop   04/04/23 1700  ceFAZolin (ANCEF) IVPB 2g/100 mL premix        2 g 200 mL/hr over 30 Minutes Intravenous Every 8 hours 04/04/23 1245 04/05/23 1013   04/04/23 0915  vancomycin (VANCOCIN) powder  Status:  Discontinued          As needed 04/04/23 0916 04/04/23 1024   04/04/23 0600  ceFAZolin (ANCEF) IVPB 2g/100 mL premix        2 g 200 mL/hr over 30 Minutes Intravenous On call to O.R. 04/04/23  4098 04/04/23 0751     .  Recent vital signs:  Vitals:   04/08/23 0253 04/08/23 0830  BP: (!) 104/55 120/65  Pulse: 93 86  Resp: 20 19  Temp: 98.5 F (36.9 C) 98 F (36.7 C)  SpO2: 96% 100%    Recent laboratory studies:  Results for orders placed or performed during the hospital encounter of 04/04/23  CBC with Differential/Platelet   Collection Time: 04/05/23 12:26 PM  Result Value Ref Range   WBC 13.9 (H) 4.0 - 10.5 K/uL   RBC 2.70 (L) 3.87 - 5.11 MIL/uL   Hemoglobin 8.7 (L) 12.0 - 15.0 g/dL   HCT 11.9 (L) 14.7 - 82.9 %   MCV 93.3 80.0 - 100.0 fL   MCH 32.2 26.0 - 34.0 pg   MCHC 34.5 30.0 - 36.0 g/dL   RDW 56.2 13.0 - 86.5 %   Platelets 127 (L) 150 - 400 K/uL   nRBC 0.0 0.0 - 0.2 %   Neutrophils Relative % 70 %   Neutro Abs 9.7 (H) 1.7 - 7.7 K/uL   Lymphocytes Relative 19 %   Lymphs Abs 2.7 0.7 - 4.0 K/uL   Monocytes Relative 10 %   Monocytes Absolute 1.4 (H) 0.1 - 1.0 K/uL   Eosinophils Relative 0 %   Eosinophils Absolute 0.1 0.0 - 0.5 K/uL   Basophils Relative 0 %   Basophils Absolute 0.0 0.0 - 0.1 K/uL   Immature  Granulocytes 1 %   Abs Immature Granulocytes 0.07 0.00 - 0.07 K/uL    Discharge Medications:   Allergies as of 04/08/2023       Reactions   Ibuprofen Other (See Comments)   High does causes stomach pains   Asa [aspirin] Other (See Comments)   UNSPECIFIED REACTION    Iohexol Rash   Pt had reaction after 06/21/18 TAVR CT scans   Prednisone Palpitations   Oral        Medication List     STOP taking these medications    XANAX PO       TAKE these medications    amoxicillin 500 MG capsule Commonly known as: AMOXIL Take 2,000 mg by mouth once.   apixaban 5 MG Tabs tablet Commonly known as: Eliquis Take 1 tablet (5 mg total) by mouth 2 (two) times daily.   Eliquis 5 MG Tabs tablet Generic drug: apixaban TAKE 1 TABLET BY MOUTH TWICE A DAY   Apple Cider Vinegar 500 MG Tabs Take 500 mg by mouth daily as needed (with fatty  foods).   CALCIUM 1200+D3 PO Take 1 tablet by mouth daily.   cetirizine 10 MG tablet Commonly known as: ZYRTEC Take 10 mg by mouth daily as needed for allergies.   docusate sodium 100 MG capsule Commonly known as: COLACE Take 1 capsule (100 mg total) by mouth 2 (two) times daily.   fluticasone 50 MCG/ACT nasal spray Commonly known as: FLONASE Place 1 spray into both nostrils daily as needed for allergies.   Knee Brace/Hinged Bars Medium Misc Use daily while awake for knee support. Dx code Z61.096, G89.29   Lidocaine 4 % Soln Apply 1 application  topically daily as needed (Knee pain/Back Pain).   losartan 50 MG tablet Commonly known as: COZAAR TAKE 1 TABLET BY MOUTH EVERY DAY   methocarbamol 500 MG tablet Commonly known as: ROBAXIN Take 1 tablet (500 mg total) by mouth every 8 (eight) hours as needed for muscle spasms.   multivitamin with minerals Tabs tablet Take 1 tablet by mouth every other day. Woman Centrum 50+   oxyCODONE 5 MG immediate release tablet Commonly known as: Oxy IR/ROXICODONE Take 1 tablet (5 mg total) by mouth every 4 (four) hours as needed for moderate pain (pain score 4-6).   polyethylene glycol 17 g packet Commonly known as: MIRALAX / GLYCOLAX Take 17 g by mouth 2 (two) times daily.   PROBIOTIC ADVANCED PO Take 1 tablet by mouth every other day.   rosuvastatin 40 MG tablet Commonly known as: CRESTOR Take 1 tablet (40 mg total) by mouth daily.   SYSTANE COMPLETE OP Place 1 drop into both eyes 3 (three) times daily as needed (dry/irritated eyes.).   Tylenol 8 Hour Arthritis Pain 650 MG CR tablet Generic drug: acetaminophen Take 650 mg by mouth 2 (two) times daily.               Durable Medical Equipment  (From admission, onward)           Start     Ordered   04/05/23 1035  For home use only DME Bedside commode  Once       Comments: Confine to one room.  Question:  Patient needs a bedside commode to treat with the following  condition  Answer:  S/P total hip arthroplasty   04/05/23 1035            Diagnostic Studies: DG Pelvis Portable Result Date: 04/04/2023 CLINICAL DATA:  Status post  left hip replacement. EXAM: PORTABLE PELVIS 1-2 VIEWS COMPARISON:  None Available. FINDINGS: Left hip arthroplasty in expected alignment. No periprosthetic lucency or fracture. Recent postsurgical change includes air and edema in the soft tissues. IMPRESSION: Left hip arthroplasty without immediate postoperative complication. Electronically Signed   By: Narda Rutherford M.D.   On: 04/04/2023 14:40   DG HIP UNILAT WITH PELVIS 1V LEFT Result Date: 04/04/2023 CLINICAL DATA:  Elective surgery. EXAM: DG HIP (WITH OR WITHOUT PELVIS) 1V*L* COMPARISON:  None Available. FINDINGS: Four fluoroscopic spot views of the pelvis and left hip obtained in the operating room. Images during hip arthroplasty. Fluoroscopy time 31 seconds. Dose 2.99 mGy. IMPRESSION: Intraoperative fluoroscopy during left hip arthroplasty. Electronically Signed   By: Narda Rutherford M.D.   On: 04/04/2023 10:06   DG C-Arm 1-60 Min-No Report Result Date: 04/04/2023 Fluoroscopy was utilized by the requesting physician.  No radiographic interpretation.   DG C-Arm 1-60 Min-No Report Result Date: 04/04/2023 Fluoroscopy was utilized by the requesting physician.  No radiographic interpretation.    Disposition:      Contact information for follow-up providers     Glori Luis, MD Follow up.   Specialty: Family Medicine Contact information: [address] [place] [address] [phone]         [facility] Follow up.   Why: Home health services will be provided by [facility], start of care within 48 hours post discharge.  Please call [phone] with questions.             Contact information for after-discharge care     Destination     [facility] [place] Preferred SNF .   Service: Skilled  Nursing Contact information: [address] [place] [address] [phone]                      Signed: Karenann Cai 04/08/2023, 11:11 AM

## 2023-04-08 NOTE — Plan of Care (Signed)
 Pt is noncompliant with movement regarding left knee and leg. Refusing to take narcotics. Pt encouraged to take medication to control pain so she is able to move easier in bed. Multiple incontinent episodes during overnight. Ted hose removed and scd applied during overnight due to indention in upper thigh noted from location of ted hose.  Problem: Activity: Goal: Risk for activity intolerance will decrease Outcome: Not Progressing   Problem: Education: Goal: Knowledge of General Education information will improve Description: Including pain rating scale, medication(s)/side effects and non-pharmacologic comfort measures Outcome: Progressing   Problem: Health Behavior/Discharge Planning: Goal: Ability to manage health-related needs will improve Outcome: Progressing   Problem: Clinical Measurements: Goal: Ability to maintain clinical measurements within normal limits will improve Outcome: Progressing Goal: Will remain free from infection Outcome: Progressing Goal: Diagnostic test results will improve Outcome: Progressing Goal: Respiratory complications will improve Outcome: Progressing Goal: Cardiovascular complication will be avoided Outcome: Progressing   Problem: Nutrition: Goal: Adequate nutrition will be maintained Outcome: Progressing   Problem: Coping: Goal: Level of anxiety will decrease Outcome: Progressing   Problem: Elimination: Goal: Will not experience complications related to bowel motility Outcome: Progressing Goal: Will not experience complications related to urinary retention Outcome: Progressing   Problem: Pain Managment: Goal: General experience of comfort will improve and/or be controlled Outcome: Progressing   Problem: Safety: Goal: Ability to remain free from injury will improve Outcome: Progressing   Problem: Skin Integrity: Goal: Risk for impaired skin integrity will decrease Outcome: Progressing

## 2023-04-08 NOTE — TOC Transition Note (Signed)
 Transition of Care [facility]) - Discharge Note   Patient Details  Name: Donna Rios MRN: [id number] Date of Birth: [birth year]  Transition of [facility]) CM/SW Contact:  Deatra Robinson, [place] Phone Number: 04/08/2023, 3:33 PM   Clinical Narrative:  Pt for dc to [facility] today. Spoke to [place] in admissions who confirmed they are prepared to admit pt to room 106. Pt and pt's spouse aware of dc and report agreeable. RN provided with number for report and PTAR arranged for transport. SW signing off at dc.   Dellie Burns, MSW, LCSW [phone] (coverage)       Final next level of care: Skilled Nursing Facility Barriers to Discharge: Barriers Resolved   Patient Goals and CMS Choice   CMS Medicare.gov Compare Post Acute Care list provided to:: Patient Choice offered to / list presented to : Patient [place] ownership interest in [facility]provided to:: Patient    Discharge Placement              Patient chooses bed at: [facility] Patient to be transferred to facility by: [facility] Name of family member notified: Wendell/spouse at bedside Patient and family notified of of transfer: 04/08/23  Discharge Plan and Services Additional resources added to the After Visit Summary for     Discharge Planning Services: CM Consult            DME Arranged: Bedside commode DME Agency: [employer], [employer] Date DME Agency Contacted: 04/05/23 Time DME Agency Contacted: 1040 Representative spoke with at DME Agency: Vaughan Basta            Social Drivers of Health (SDOH) Interventions SDOH Screenings   Food Insecurity: No Food Insecurity (04/05/2023)  Housing: Low Risk  (04/05/2023)  Transportation Needs: No Transportation Needs (04/05/2023)  Utilities: Not At Risk (04/05/2023)  Alcohol Screen: Low Risk  (09/16/2022)  Depression (PHQ2-9): Low Risk  (11/26/2022)  Financial Resource Strain: Low Risk  (09/16/2022)  Physical Activity:  Inactive (09/16/2022)  Social Connections: Moderately Isolated (04/05/2023)  Stress: No Stress Concern Present (09/16/2022)  Tobacco Use: High Risk (04/04/2023)  Health Literacy: Adequate Health Literacy (09/16/2022)     Readmission Risk Interventions     No data to display

## 2023-04-08 NOTE — TOC Progression Note (Addendum)
 Transition of Care [facility]) - Progression Note    Patient Details  Name: Donna Rios MRN: [id number] Date of Birth: 01-30-1950  Transition [facility]) CM/SW Contact  Dellie Burns [place], [place] Phone Number: 04/08/2023, 10:14 AM  Clinical Narrative:  Met with pt and pt's spouse re SNF choice and they have accepted [facility]. Confirmed bed with French Ana at [facility]. [facility] auth request submitted. [facility] is able to accept over weekend if auth received. If weekend dc is appropriate, TOC will reach out to April at [facility] [phone]. [facility] requests dc summary by 12pm for weekend admission. MD updated.  UPDATE 1330: auth received for [facility] (reference [id number], valid 3/14-3/18) and confirmed they are prepared to admit today. Reached out to MD and APP re paper script for narcotic.   Dellie Burns, MSW, LCSW [phone] (coverage)           Expected Discharge Plan and Services   Discharge Planning Services: CM Consult                     DME Arranged: Bedside commode DME Agency: [employer], [employer] Date DME Agency Contacted: 04/05/23 Time DME Agency Contacted: 1040 Representative spoke with at DME Agency: Vaughan Basta             Social Determinants of Health (SDOH) Interventions SDOH Screenings   Food Insecurity: No Food Insecurity (04/05/2023)  Housing: Low Risk  (04/05/2023)  Transportation Needs: No Transportation Needs (04/05/2023)  Utilities: Not At Risk (04/05/2023)  Alcohol Screen: Low Risk  (09/16/2022)  Depression (PHQ2-9): Low Risk  (11/26/2022)  Financial Resource Strain: Low Risk  (09/16/2022)  Physical Activity: Inactive (09/16/2022)  Social Connections: Moderately Isolated (04/05/2023)  Stress: No Stress Concern Present (09/16/2022)  Tobacco Use: High Risk (04/04/2023)  Health Literacy: Adequate Health Literacy (09/16/2022)    Readmission Risk Interventions     No data to display

## 2023-04-08 NOTE — Progress Notes (Signed)
 Report called to [facility], LPN at [facility].

## 2023-04-08 NOTE — Telephone Encounter (Signed)
 Josie with [facility] called stating that she is needing a signed Rx for Xanax for patient.  CB# [phone].  Please advise.  Thank you.

## 2023-04-17 DIAGNOSIS — M1612 Unilateral primary osteoarthritis, left hip: Secondary | ICD-10-CM

## 2023-04-18 ENCOUNTER — Other Ambulatory Visit (INDEPENDENT_AMBULATORY_CARE_PROVIDER_SITE_OTHER): Payer: Self-pay

## 2023-04-18 ENCOUNTER — Ambulatory Visit (INDEPENDENT_AMBULATORY_CARE_PROVIDER_SITE_OTHER): Payer: Medicare PPO | Admitting: Surgical

## 2023-04-18 DIAGNOSIS — Z96642 Presence of left artificial hip joint: Secondary | ICD-10-CM

## 2023-04-20 ENCOUNTER — Encounter: Payer: Self-pay | Admitting: Surgical
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
## 2023-04-21 ENCOUNTER — Telehealth: Payer: Self-pay | Admitting: Orthopedic Surgery

## 2023-04-21 ENCOUNTER — Telehealth: Payer: Self-pay | Admitting: Radiology

## 2023-04-21 NOTE — Progress Notes (Signed)
 Left voicemail for Tresa Endo at [facility] requesting return call.

## 2023-04-21 NOTE — Telephone Encounter (Signed)
 I left voicemail for [employer], Associate Professor, at [facility] requesting return call. [name] requested dopplers to rule out DVT at her last appointment. We need to know if study was done and get the results.

## 2023-04-21 NOTE — Telephone Encounter (Signed)
 Spoke with [employer]. Message sent to [facility] on chart note.

## 2023-04-21 NOTE — Telephone Encounter (Signed)
 Tresa Endo from [facility] returning a phone call for pt. Tresa Endo also stated pt ultrasound was negative. Best call back number [phone]

## 2023-04-25 ENCOUNTER — Telehealth: Payer: Self-pay

## 2023-04-25 NOTE — Transitions of Care (Post Inpatient/ED Visit) (Signed)
 04/25/2023  Name: Donna Rios MRN: [id number] DOB: 1950-10-11  Today's TOC FU Call Status: Today's TOC FU Call Status:: Successful TOC FU Call Completed TOC FU Call Complete Date: 04/25/23 Patient's Name and Date of Birth confirmed.  Transition Care Management Follow-up Telephone Call Date of Discharge: 04/23/23 Discharge Facility: Other ([facility]) Name of Other ([facility]) Discharge Facility: [facility] Type of Discharge: Inpatient Admission Primary Inpatient Discharge Diagnosis:: left shoulder pain How have you been since you were released from the hospital?: Better Any questions or concerns?: No  Items Reviewed: Did you receive and understand the discharge instructions provided?: Yes Medications obtained,verified, and reconciled?: Yes (Medications Reviewed) Any new allergies since your discharge?: No Dietary orders reviewed?: Yes Do you have support at home?: Yes People in Home: spouse  Medications Reviewed Today: Medications Reviewed Today     Reviewed by Karena Addison, LPN (Licensed Practical Nurse) on 04/25/23 at 1026  Med List Status: <None>   Medication Order Taking? Sig Documenting Provider Last Dose Status Informant  acetaminophen (TYLENOL 8 HOUR ARTHRITIS PAIN) 650 MG CR tablet [id number] No Take 650 mg by mouth 2 (two) times daily. [provider] 04/04/2023  1:00 AM Active Self  amoxicillin (AMOXIL) 500 MG capsule [id number] No Take 2,000 mg by mouth once. [provider] More than a month Active Self           Med Note Lenoria Farrier   Thu Mar 24, 2023 12:02 PM) Dental procedure  apixaban (ELIQUIS) 5 MG TABS tablet [id number] No Take 1 tablet (5 mg total) by mouth 2 (two) times daily. Glori Luis, MD 03/31/2023 Active Self  Apple Cider Vinegar 500 MG TABS [id number] No Take 500 mg by mouth daily as needed (with fatty foods). [provider] Past Week Active Self  Calcium-Magnesium-Vitamin D (CALCIUM 1200+D3 PO) [id number]  No Take 1 tablet by mouth daily. [provider] Past Week Active Self  cetirizine (ZYRTEC) 10 MG tablet [id number] No Take 10 mg by mouth daily as needed for allergies. [provider] Past Week Active Self  docusate sodium (COLACE) 100 MG capsule [id number]  Take 1 capsule (100 mg total) by mouth 2 (two) times daily. Magnant, Joycie Peek, PA-C  Active   Elastic Bandages & Supports (KNEE BRACE/HINGED BARS MEDIUM) MISC [id number] No Use daily while awake for knee support. 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  [provider] 04/03/2023 Active Self  Lidocaine 4 % SOLN [id number] No Apply 1 application  topically daily as needed (Knee pain/Back Pain). [provider] Past Week Active Self  losartan (COZAAR) 50 MG tablet [id number] No TAKE 1 TABLET BY MOUTH EVERY DAY Glori Luis, MD 04/03/2023 Active Self  methocarbamol (ROBAXIN) 500 MG tablet [id number]  Take 1 tablet (500 mg total) by mouth every 8 (eight) hours as needed for muscle spasms. Magnant, Joycie Peek, PA-C  Active   Multiple Vitamin (MULTIVITAMIN WITH MINERALS) TABS tablet [id number] No Take 1 tablet by mouth every other day. Woman Centrum 50+ [provider] Past Week Active Self  oxyCODONE (OXY IR/ROXICODONE) 5 MG immediate release tablet [id number]  Take 1 tablet (5 mg total) by mouth every 4 (four) hours as needed for moderate pain (pain score 4-6).  Magnant, Joycie Peek, PA-C  Active   polyethylene glycol (MIRALAX / GLYCOLAX) 17 g packet [id number] No Take 17 g by mouth 2 (two) times daily. [provider] 04/03/2023 Active Self  Probiotic Product (PROBIOTIC ADVANCED PO) [id number] No Take 1 tablet by mouth every other day. [provider] Past Week Active Self  Propylene Glycol (SYSTANE COMPLETE OP) [id number] No Place 1 drop into both eyes 3 (three) times daily as needed (dry/irritated eyes.).  [provider] Past Week Active Self  rosuvastatin (CRESTOR) 40 MG tablet [id number] No Take 1 tablet (40 mg total) by mouth daily. Glori Luis, MD 04/04/2023  4:15 AM Active Self            Home Care and Equipment/Supplies: Were Home Health Services Ordered?: NA Any new equipment or medical supplies ordered?: NA  Functional Questionnaire: Do you need assistance with bathing/showering or dressing?: No Do you need assistance with meal preparation?: No Do you need assistance with eating?: No Do you have difficulty maintaining continence: No Do you need assistance with getting out of bed/getting out of a chair/moving?: No Do you have difficulty managing or taking your medications?: No  Follow up appointments reviewed: PCP Follow-up appointment confirmed?: Yes Date of PCP follow-up appointment?: 04/27/23 Follow-up Provider: [facility] Follow-up appointment confirmed?: No Reason Specialist Follow-Up Not Confirmed: Patient has Specialist Provider Number and will Call for Appointment Do you need transportation to your follow-up appointment?: No Do you understand care options if your condition(s) worsen?: Yes-patient verbalized understanding    SIGNATURE  Karena Addison, LPN [facility] Nurse Health Advisor Direct Dial [phone]

## 2023-04-26 ENCOUNTER — Ambulatory Visit: Admitting: Rehabilitative and Restorative Service Providers"

## 2023-04-27 ENCOUNTER — Ambulatory Visit: Admitting: Nurse Practitioner

## 2023-04-27 VITALS — BP 130/76 | HR 71 | Temp 97.6°F | Ht 69.0 in | Wt 178.6 lb

## 2023-04-27 DIAGNOSIS — Z96642 Presence of left artificial hip joint: Secondary | ICD-10-CM

## 2023-04-27 DIAGNOSIS — K5900 Constipation, unspecified: Secondary | ICD-10-CM | POA: Diagnosis not present

## 2023-04-27 NOTE — Progress Notes (Unsigned)
 Bethanie Dicker, NP-C Phone: [phone]  Donna Rios is a 73 y.o. female who presents today for follow up after nursing home discharge.  Discussed the use of AI scribe software for clinical note transcription with the patient, who gave verbal consent to proceed.  History of Present Illness   Donna Rios "[name]" is a 73 year old female who presents for follow-up after left total hip replacement.  She underwent a left total hip replacement on April 04, 2023, and was admitted to a nursing facility named [facility] for rehabilitation. She was discharged on April 23, 2023, and has been at home since. She has an upcoming follow-up appointment with orthopedic rehabilitation scheduled for Friday.  She experiences swelling in her legs, which was evaluated with an ultrasound that returned negative results. She has difficulty wearing compression garments due to the swelling and a persistent spot on her leg. Pain is managed with oxycodone taken at 6 AM, 12 PM, 6 PM, and 12 AM. She also takes a muscle relaxer once a day, although it causes drowsiness.  She engages in home exercises such as leg kicks, knee presses, and ankle twists. Since returning home, she has been more active, performing household tasks like laundry and cooking, and can take showers independently. She uses a walker for mobility and avoids walking on uneven surfaces like the yard.  She has a history of constipation, which she manages with a fiber diet and prunes. No recent hospital visits for shoulder pain, although she recalls an adjustment to her walker height due to a past arm fracture.  She is taking calcium and vitamin D supplements, although she is unsure if they match the dosage provided at the nursing facility. She has been using a recliner at home to help with swelling and reports increased urination. No recent coughing spells, shortness of breath, or chest pain. She uses a spirometer occasionally at home.      Social History    Tobacco Use  Smoking Status Every Day   Current packs/day: 0.25   Average packs/day: 0.5 packs/day for 54.3 years (27.1 ttl pk-yrs)   Types: Cigarettes   Start date: 1971   Passive exposure: Past  Smokeless Tobacco Never    Current Outpatient Medications on File Prior to Visit  Medication Sig Dispense Refill   acetaminophen (TYLENOL 8 HOUR ARTHRITIS PAIN) 650 MG CR tablet Take 650 mg by mouth 2 (two) times daily.     amoxicillin (AMOXIL) 500 MG capsule Take 2,000 mg by mouth once.     apixaban (ELIQUIS) 5 MG TABS tablet Take 1 tablet (5 mg total) by mouth 2 (two) times daily. 28 tablet    Apple Cider Vinegar 500 MG TABS Take 500 mg by mouth daily as needed (with fatty foods).     Calcium-Magnesium-Vitamin D (CALCIUM 1200+D3 PO) Take 1 tablet by mouth daily.     cetirizine (ZYRTEC) 10 MG tablet Take 10 mg by mouth daily as needed for allergies.     docusate sodium (COLACE) 100 MG capsule Take 1 capsule (100 mg total) by mouth 2 (two) times daily. 10 capsule 0   Elastic Bandages & Supports (KNEE BRACE/HINGED BARS MEDIUM) MISC Use daily while awake for knee support. Dx code M25.562, G89.29 1 each 0   ELIQUIS 5 MG TABS tablet TAKE 1 TABLET BY MOUTH TWICE A DAY 180 tablet 0   fluticasone (FLONASE) 50 MCG/ACT nasal spray Place 1 spray into both nostrils daily as needed for allergies.  Lidocaine 4 % SOLN Apply 1 application  topically daily as needed (Knee pain/Back Pain).     losartan (COZAAR) 50 MG tablet TAKE 1 TABLET BY MOUTH EVERY DAY 90 tablet 0   methocarbamol (ROBAXIN) 500 MG tablet Take 1 tablet (500 mg total) by mouth every 8 (eight) hours as needed for muscle spasms. 30 tablet 0   Multiple Vitamin (MULTIVITAMIN WITH MINERALS) TABS tablet Take 1 tablet by mouth every other day. Woman Centrum 50+     oxyCODONE (OXY IR/ROXICODONE) 5 MG immediate release tablet Take 1 tablet (5 mg total) by mouth every 4 (four) hours as needed for moderate pain (pain score 4-6). 30 tablet 0    polyethylene glycol (MIRALAX / GLYCOLAX) 17 g packet Take 17 g by mouth 2 (two) times daily.     Probiotic Product (PROBIOTIC ADVANCED PO) Take 1 tablet by mouth every other day.     Propylene Glycol (SYSTANE COMPLETE OP) Place 1 drop into both eyes 3 (three) times daily as needed (dry/irritated eyes.).      rosuvastatin (CRESTOR) 40 MG tablet Take 1 tablet (40 mg total) by mouth daily. 90 tablet 3   No current facility-administered medications on file prior to visit.    ROS see history of present illness  Objective  Physical Exam Vitals:   04/27/23 1054  BP: 130/76  Pulse: 71  Temp: 97.6 F (36.4 C)  SpO2: 99%    BP Readings from Last 3 Encounters:  04/27/23 130/76  04/08/23 (!) 133/57  03/28/23 133/78   Wt Readings from Last 3 Encounters:  04/27/23 178 lb 9.6 oz (81 kg)  04/04/23 166 lb (75.3 kg)  03/28/23 166 lb 6.4 oz (75.5 kg)    Physical Exam Constitutional:      General: She is not in acute distress.    Appearance: Normal appearance.  HENT:     Head: Normocephalic.  Cardiovascular:     Rate and Rhythm: Normal rate and regular rhythm.     Heart sounds: Normal heart sounds.  Pulmonary:     Effort: Pulmonary effort is normal.     Breath sounds: Normal breath sounds.  Musculoskeletal:     Left hip: Tenderness present. Decreased range of motion. Decreased strength.     Left lower leg: Swelling present.     Comments: Well healing scar present on left hip.  Skin:    General: Skin is warm and dry.  Neurological:     General: No focal deficit present.     Mental Status: She is alert.  Psychiatric:        Mood and Affect: Mood normal.        Behavior: Behavior normal.     Assessment/Plan: Please see individual problem list.  S/P hip replacement, left Assessment & Plan: She is recovering well from left total hip arthroplasty with leg swelling, negative for DVT, and experiencing difficulty with compression garments. Continue oxycodone and reassess regimen  after starting physical therapy. Encourage home exercises. Follow up with orthopedic rehabilitation on April 29, 2023, and with the orthopedic surgeon on May 16, 2023.   Constipation, unspecified constipation type Assessment & Plan: Chronic issue. Constipation is exacerbated by oxycodone use and managed with a fiber-rich diet and prunes. Continue. She can try Miralax and stool softeners as needed. Follow up with GI if needed.     Return in 1 month (on 05/27/2023) for TOC as scheduled.   Bethanie Dicker, NP-C [facility]

## 2023-04-28 ENCOUNTER — Encounter: Payer: Self-pay | Admitting: Nurse Practitioner

## 2023-04-28 NOTE — Assessment & Plan Note (Signed)
 She is recovering well from left total hip arthroplasty with leg swelling, negative for DVT, and experiencing difficulty with compression garments. Continue oxycodone and reassess regimen after starting physical therapy. Encourage home exercises. Follow up with orthopedic rehabilitation on April 29, 2023, and with the orthopedic surgeon on May 16, 2023.

## 2023-04-28 NOTE — Assessment & Plan Note (Signed)
 Chronic issue. Constipation is exacerbated by oxycodone use and managed with a fiber-rich diet and prunes. Continue. She can try Miralax and stool softeners as needed. Follow up with GI if needed.
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
## 2023-04-29 ENCOUNTER — Encounter: Payer: Self-pay | Admitting: Rehabilitative and Restorative Service Providers"

## 2023-04-29 ENCOUNTER — Ambulatory Visit: Admitting: Rehabilitative and Restorative Service Providers"

## 2023-04-29 DIAGNOSIS — M5459 Other low back pain: Secondary | ICD-10-CM | POA: Diagnosis not present

## 2023-04-29 DIAGNOSIS — M6281 Muscle weakness (generalized): Secondary | ICD-10-CM | POA: Diagnosis not present

## 2023-04-29 DIAGNOSIS — R262 Difficulty in walking, not elsewhere classified: Secondary | ICD-10-CM | POA: Diagnosis not present

## 2023-04-29 DIAGNOSIS — M25552 Pain in left hip: Secondary | ICD-10-CM

## 2023-04-29 DIAGNOSIS — R600 Localized edema: Secondary | ICD-10-CM

## 2023-05-02 ENCOUNTER — Ambulatory Visit: Admitting: Physical Therapy

## 2023-05-02 ENCOUNTER — Encounter: Payer: Self-pay | Admitting: Physical Therapy

## 2023-05-02 DIAGNOSIS — R600 Localized edema: Secondary | ICD-10-CM

## 2023-05-02 DIAGNOSIS — M5459 Other low back pain: Secondary | ICD-10-CM

## 2023-05-02 DIAGNOSIS — M25552 Pain in left hip: Secondary | ICD-10-CM

## 2023-05-02 DIAGNOSIS — M6281 Muscle weakness (generalized): Secondary | ICD-10-CM | POA: Diagnosis not present

## 2023-05-02 DIAGNOSIS — R262 Difficulty in walking, not elsewhere classified: Secondary | ICD-10-CM
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
## 2023-05-06 ENCOUNTER — Encounter: Payer: Self-pay | Admitting: Physical Therapy

## 2023-05-06 ENCOUNTER — Ambulatory Visit: Admitting: Physical Therapy

## 2023-05-06 DIAGNOSIS — M6281 Muscle weakness (generalized): Secondary | ICD-10-CM | POA: Diagnosis not present

## 2023-05-06 DIAGNOSIS — M25552 Pain in left hip: Secondary | ICD-10-CM

## 2023-05-06 DIAGNOSIS — R262 Difficulty in walking, not elsewhere classified: Secondary | ICD-10-CM | POA: Diagnosis not present

## 2023-05-06 DIAGNOSIS — M5459 Other low back pain: Secondary | ICD-10-CM

## 2023-05-06 DIAGNOSIS — R600 Localized edema: Secondary | ICD-10-CM
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
## 2023-05-09 ENCOUNTER — Telehealth: Payer: Self-pay | Admitting: Surgical

## 2023-05-09 NOTE — Telephone Encounter (Signed)
 Patient called and said that she had a suture that was left in. She thought they all was out but when she started moving its pulling. OV#[phone]

## 2023-05-10 ENCOUNTER — Encounter: Payer: Self-pay | Admitting: Physical Therapy

## 2023-05-10 ENCOUNTER — Ambulatory Visit: Admitting: Physical Therapy

## 2023-05-10 DIAGNOSIS — M25552 Pain in left hip: Secondary | ICD-10-CM | POA: Diagnosis not present

## 2023-05-10 DIAGNOSIS — R600 Localized edema: Secondary | ICD-10-CM

## 2023-05-10 DIAGNOSIS — M5459 Other low back pain: Secondary | ICD-10-CM | POA: Diagnosis not present

## 2023-05-10 DIAGNOSIS — M6281 Muscle weakness (generalized): Secondary | ICD-10-CM

## 2023-05-10 DIAGNOSIS — R262 Difficulty in walking, not elsewhere classified: Secondary | ICD-10-CM
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
## 2023-05-11 ENCOUNTER — Ambulatory Visit: Admitting: Surgical

## 2023-05-11 ENCOUNTER — Encounter: Payer: Self-pay | Admitting: Surgical

## 2023-05-11 ENCOUNTER — Ambulatory Visit: Admitting: Physical Therapy

## 2023-05-11 ENCOUNTER — Encounter: Payer: Self-pay | Admitting: Physical Therapy

## 2023-05-11 DIAGNOSIS — R262 Difficulty in walking, not elsewhere classified: Secondary | ICD-10-CM

## 2023-05-11 DIAGNOSIS — M25552 Pain in left hip: Secondary | ICD-10-CM | POA: Diagnosis not present

## 2023-05-11 DIAGNOSIS — M5459 Other low back pain: Secondary | ICD-10-CM

## 2023-05-11 DIAGNOSIS — I878 Other specified disorders of veins: Secondary | ICD-10-CM

## 2023-05-11 DIAGNOSIS — M6281 Muscle weakness (generalized): Secondary | ICD-10-CM

## 2023-05-11 DIAGNOSIS — R600 Localized edema: Secondary | ICD-10-CM

## 2023-05-11 DIAGNOSIS — Z96642 Presence of left artificial hip joint: Secondary | ICD-10-CM

## 2023-05-11 MED ORDER — DOXYCYCLINE HYCLATE 100 MG PO TABS: 100.0000 mg | ORAL_TABLET | Freq: Two times a day (BID) | ORAL | 0 refills | Status: DC

## 2023-05-11 MED ORDER — METHOCARBAMOL 500 MG PO TABS: 500.0000 mg | ORAL_TABLET | Freq: Three times a day (TID) | ORAL | 0 refills | Status: DC | PRN
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
## 2023-05-11 NOTE — Addendum Note (Signed)
 Addended by: Acey Ace on: 05/11/2023 01:19 PM   Modules accepted: Orders
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
## 2023-05-16 ENCOUNTER — Encounter: Admitting: Surgical

## 2023-05-18 ENCOUNTER — Ambulatory Visit: Admitting: Physical Therapy

## 2023-05-18 ENCOUNTER — Encounter: Payer: Self-pay | Admitting: Physical Therapy

## 2023-05-18 DIAGNOSIS — M6281 Muscle weakness (generalized): Secondary | ICD-10-CM

## 2023-05-18 DIAGNOSIS — M25552 Pain in left hip: Secondary | ICD-10-CM

## 2023-05-18 DIAGNOSIS — R262 Difficulty in walking, not elsewhere classified: Secondary | ICD-10-CM | POA: Diagnosis not present

## 2023-05-18 DIAGNOSIS — M5459 Other low back pain: Secondary | ICD-10-CM | POA: Diagnosis not present

## 2023-05-18 DIAGNOSIS — R600 Localized edema: Secondary | ICD-10-CM
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
## 2023-05-20 ENCOUNTER — Ambulatory Visit: Admitting: Rehabilitative and Restorative Service Providers"

## 2023-05-20 ENCOUNTER — Encounter: Payer: Self-pay | Admitting: Rehabilitative and Restorative Service Providers"

## 2023-05-20 DIAGNOSIS — M25552 Pain in left hip: Secondary | ICD-10-CM | POA: Diagnosis not present

## 2023-05-20 DIAGNOSIS — M6281 Muscle weakness (generalized): Secondary | ICD-10-CM

## 2023-05-20 DIAGNOSIS — R262 Difficulty in walking, not elsewhere classified: Secondary | ICD-10-CM

## 2023-05-20 DIAGNOSIS — M5459 Other low back pain: Secondary | ICD-10-CM

## 2023-05-20 DIAGNOSIS — R600 Localized edema: Secondary | ICD-10-CM
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
## 2023-05-24 ENCOUNTER — Encounter: Payer: Self-pay | Admitting: Rehabilitative and Restorative Service Providers"

## 2023-05-24 ENCOUNTER — Ambulatory Visit: Admitting: Rehabilitative and Restorative Service Providers"

## 2023-05-24 DIAGNOSIS — M5459 Other low back pain: Secondary | ICD-10-CM | POA: Diagnosis not present

## 2023-05-24 DIAGNOSIS — M6281 Muscle weakness (generalized): Secondary | ICD-10-CM

## 2023-05-24 DIAGNOSIS — M25552 Pain in left hip: Secondary | ICD-10-CM

## 2023-05-24 DIAGNOSIS — R600 Localized edema: Secondary | ICD-10-CM

## 2023-05-24 DIAGNOSIS — R262 Difficulty in walking, not elsewhere classified: Secondary | ICD-10-CM | POA: Diagnosis not present
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
## 2023-05-26 ENCOUNTER — Ambulatory Visit: Admitting: Physical Therapy

## 2023-05-26 ENCOUNTER — Encounter: Payer: Self-pay | Admitting: Physical Therapy

## 2023-05-26 DIAGNOSIS — M6281 Muscle weakness (generalized): Secondary | ICD-10-CM | POA: Diagnosis not present

## 2023-05-26 DIAGNOSIS — R262 Difficulty in walking, not elsewhere classified: Secondary | ICD-10-CM | POA: Diagnosis not present

## 2023-05-26 DIAGNOSIS — M5459 Other low back pain: Secondary | ICD-10-CM

## 2023-05-26 DIAGNOSIS — R600 Localized edema: Secondary | ICD-10-CM

## 2023-05-26 DIAGNOSIS — M25552 Pain in left hip: Secondary | ICD-10-CM | POA: Diagnosis not present
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
## 2023-05-27 ENCOUNTER — Encounter: Payer: Medicare PPO | Admitting: Nurse Practitioner

## 2023-05-31 ENCOUNTER — Ambulatory Visit: Admitting: Family

## 2023-05-31 ENCOUNTER — Encounter: Admitting: Rehabilitative and Restorative Service Providers"

## 2023-05-31 DIAGNOSIS — R600 Localized edema: Secondary | ICD-10-CM | POA: Diagnosis not present

## 2023-05-31 DIAGNOSIS — I872 Venous insufficiency (chronic) (peripheral): Secondary | ICD-10-CM

## 2023-05-31 DIAGNOSIS — I878 Other specified disorders of veins: Secondary | ICD-10-CM | POA: Diagnosis not present

## 2023-06-01 ENCOUNTER — Encounter: Payer: Self-pay | Admitting: Family
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
## 2023-06-02 ENCOUNTER — Encounter: Payer: Self-pay | Admitting: Rehabilitative and Restorative Service Providers"

## 2023-06-02 ENCOUNTER — Ambulatory Visit: Admitting: Rehabilitative and Restorative Service Providers"

## 2023-06-02 DIAGNOSIS — R262 Difficulty in walking, not elsewhere classified: Secondary | ICD-10-CM

## 2023-06-02 DIAGNOSIS — M6281 Muscle weakness (generalized): Secondary | ICD-10-CM | POA: Diagnosis not present

## 2023-06-02 DIAGNOSIS — R600 Localized edema: Secondary | ICD-10-CM

## 2023-06-02 DIAGNOSIS — M25552 Pain in left hip: Secondary | ICD-10-CM

## 2023-06-02 DIAGNOSIS — M5459 Other low back pain: Secondary | ICD-10-CM
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
## 2023-06-06 ENCOUNTER — Telehealth: Payer: Self-pay | Admitting: Surgical

## 2023-06-06 NOTE — Telephone Encounter (Signed)
 Patient called and ask if you could send a referral to [facility] for her lower back pain. ZH#[phone]

## 2023-06-06 NOTE — Telephone Encounter (Signed)
 Patient called and said she needs a refill on methocarbamol . [phone]

## 2023-06-07 ENCOUNTER — Ambulatory Visit: Admitting: Rehabilitative and Restorative Service Providers"

## 2023-06-07 ENCOUNTER — Telehealth: Payer: Self-pay | Admitting: Orthopedic Surgery

## 2023-06-07 ENCOUNTER — Other Ambulatory Visit: Payer: Self-pay | Admitting: Surgical

## 2023-06-07 ENCOUNTER — Telehealth: Payer: Self-pay | Admitting: Surgical

## 2023-06-07 ENCOUNTER — Encounter: Payer: Self-pay | Admitting: Rehabilitative and Restorative Service Providers"

## 2023-06-07 DIAGNOSIS — M5459 Other low back pain: Secondary | ICD-10-CM

## 2023-06-07 DIAGNOSIS — R262 Difficulty in walking, not elsewhere classified: Secondary | ICD-10-CM | POA: Diagnosis not present

## 2023-06-07 DIAGNOSIS — M6281 Muscle weakness (generalized): Secondary | ICD-10-CM | POA: Diagnosis not present

## 2023-06-07 DIAGNOSIS — M25552 Pain in left hip: Secondary | ICD-10-CM | POA: Diagnosis not present

## 2023-06-07 DIAGNOSIS — R600 Localized edema: Secondary | ICD-10-CM

## 2023-06-07 MED ORDER — METHOCARBAMOL 500 MG PO TABS
500.0000 mg | ORAL_TABLET | Freq: Three times a day (TID) | ORAL | 2 refills | Status: AC | PRN
Start: 2023-06-07 — End: ?

## 2023-06-07 NOTE — Telephone Encounter (Signed)
 Holding on referral. Patient is being seen in office tomorrow afternoon. Will discuss then.

## 2023-06-07 NOTE — Telephone Encounter (Signed)
 Pt had severe issue while at PT today, Athena Bland cancelled the PT Friday and told patient to see Dr Rozelle Corning or [place] Gelinas to discuss the matter urgently.

## 2023-06-07 NOTE — Telephone Encounter (Signed)
 Sure how about afternoon thx

## 2023-06-07 NOTE — Telephone Encounter (Signed)
 Work in tomorrow to see you?

## 2023-06-07 NOTE — Telephone Encounter (Signed)
 Ok for referral to repeat L-spine ESI she had earlier this year or if she has new low back pain/complaint she can go see Jacqlyn Matas to discuss
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
## 2023-06-07 NOTE — Telephone Encounter (Signed)
 Patient was here. Would like a refill on Robaxin 

## 2023-06-07 NOTE — Telephone Encounter (Signed)
 Sent in via her refill request on mychart

## 2023-06-07 NOTE — Telephone Encounter (Signed)
 Spoke with patient and advised

## 2023-06-08 ENCOUNTER — Ambulatory Visit: Admitting: Surgical

## 2023-06-08 DIAGNOSIS — M48062 Spinal stenosis, lumbar region with neurogenic claudication: Secondary | ICD-10-CM

## 2023-06-08 NOTE — Telephone Encounter (Signed)
 Looks like she is seeing Donna Rios at 2:!5

## 2023-06-08 NOTE — Telephone Encounter (Signed)
 I worked patient in to see Van Gelinas this afternoon according to message yesterday.

## 2023-06-09 ENCOUNTER — Encounter: Admitting: Physical Therapy

## 2023-06-10 ENCOUNTER — Encounter: Payer: Self-pay | Admitting: Surgical
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
## 2023-06-14 ENCOUNTER — Ambulatory Visit: Admitting: Rehabilitative and Restorative Service Providers"

## 2023-06-14 ENCOUNTER — Encounter: Payer: Self-pay | Admitting: Rehabilitative and Restorative Service Providers"

## 2023-06-14 DIAGNOSIS — R262 Difficulty in walking, not elsewhere classified: Secondary | ICD-10-CM | POA: Diagnosis not present

## 2023-06-14 DIAGNOSIS — R600 Localized edema: Secondary | ICD-10-CM

## 2023-06-14 DIAGNOSIS — M6281 Muscle weakness (generalized): Secondary | ICD-10-CM

## 2023-06-14 DIAGNOSIS — M5459 Other low back pain: Secondary | ICD-10-CM

## 2023-06-14 DIAGNOSIS — M25552 Pain in left hip: Secondary | ICD-10-CM
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
## 2023-06-15 ENCOUNTER — Other Ambulatory Visit: Payer: Self-pay | Admitting: *Deleted

## 2023-06-15 DIAGNOSIS — I872 Venous insufficiency (chronic) (peripheral): Secondary | ICD-10-CM

## 2023-06-16 ENCOUNTER — Ambulatory Visit: Admitting: Rehabilitative and Restorative Service Providers"

## 2023-06-16 ENCOUNTER — Encounter: Payer: Self-pay | Admitting: Rehabilitative and Restorative Service Providers"

## 2023-06-16 DIAGNOSIS — M25551 Pain in right hip: Secondary | ICD-10-CM

## 2023-06-16 DIAGNOSIS — M25552 Pain in left hip: Secondary | ICD-10-CM

## 2023-06-16 DIAGNOSIS — M6281 Muscle weakness (generalized): Secondary | ICD-10-CM | POA: Diagnosis not present

## 2023-06-16 DIAGNOSIS — M5459 Other low back pain: Secondary | ICD-10-CM | POA: Diagnosis not present

## 2023-06-16 DIAGNOSIS — R262 Difficulty in walking, not elsewhere classified: Secondary | ICD-10-CM

## 2023-06-16 DIAGNOSIS — R600 Localized edema: Secondary | ICD-10-CM
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
## 2023-06-21 ENCOUNTER — Encounter: Admitting: Physical Therapy

## 2023-06-22 ENCOUNTER — Ambulatory Visit (INDEPENDENT_AMBULATORY_CARE_PROVIDER_SITE_OTHER): Admitting: Orthopedic Surgery

## 2023-06-22 DIAGNOSIS — M48062 Spinal stenosis, lumbar region with neurogenic claudication: Secondary | ICD-10-CM

## 2023-06-22 DIAGNOSIS — Z96642 Presence of left artificial hip joint: Secondary | ICD-10-CM

## 2023-06-23 ENCOUNTER — Telehealth: Payer: Self-pay

## 2023-06-23 ENCOUNTER — Encounter: Admitting: Physical Therapy

## 2023-06-23 ENCOUNTER — Ambulatory Visit: Admitting: Physical Medicine and Rehabilitation

## 2023-06-23 ENCOUNTER — Other Ambulatory Visit: Payer: Self-pay

## 2023-06-23 VITALS — BP 161/82 | HR 71

## 2023-06-23 DIAGNOSIS — M5416 Radiculopathy, lumbar region: Secondary | ICD-10-CM | POA: Diagnosis not present

## 2023-06-23 MED ORDER — METHYLPREDNISOLONE ACETATE 40 MG/ML IJ SUSP
40.0000 mg | Freq: Once | INTRAMUSCULAR | Status: AC
  Administered 2023-06-23: 40 mg

## 2023-06-23 NOTE — Procedures (Signed)
 Lumbosacral Transforaminal Epidural Steroid Injection - Sub-Pedicular Approach with Fluoroscopic Guidance  Patient: Donna Rios      Date of Birth: Jul 14, 1950 MRN: [id number] PCP: No primary care provider on file.      Visit Date: 06/23/2023   Universal Protocol:    Date/Time: 06/23/2023  Consent Given By: the patient  Position: PRONE  Additional Comments: Vital signs were monitored before and after the procedure. Patient was prepped and draped in the usual sterile fashion. The correct patient, procedure, and site was verified.   Injection Procedure Details:   Procedure diagnoses: Lumbar radiculopathy [M54.16]    Meds Administered:  Meds ordered this encounter  Medications   methylPREDNISolone  acetate (DEPO-MEDROL ) injection 40 mg    Laterality: Left  Location/Site: L4  Needle:5.0 in., 22 ga.  Short bevel or Quincke spinal needle  Needle Placement: Transforaminal  Findings:    -Comments: Excellent flow of contrast along the nerve, nerve root and into the epidural space.  Procedure Details: After squaring off the end-plates to get a true AP view, the C-arm was positioned so that an oblique view of the foramen as noted above was visualized. The target area is just inferior to the "nose of the scotty dog" or sub pedicular. The soft tissues overlying this structure were infiltrated with 2-3 ml. of 1% Lidocaine  without Epinephrine .  The spinal needle was inserted toward the target using a "trajectory" view along the fluoroscope beam.  Under AP and lateral visualization, the needle was advanced so it did not puncture dura and was located close the 6 O'Clock position of the pedical in AP tracterory. Biplanar projections were used to confirm position. Aspiration was confirmed to be negative for CSF and/or blood. A 1-2 ml. volume of Isovue -250 was injected and flow of contrast was noted at each level. Radiographs were obtained for documentation purposes.   After attaining the  desired flow of contrast documented above, a 0.5 to 1.0 ml test dose of 0.25% Marcaine  was injected into each respective transforaminal space.  The patient was observed for 90 seconds post injection.  After no sensory deficits were reported, and normal lower extremity motor function was noted,   the above injectate was administered so that equal amounts of the injectate were placed at each foramen (level) into the transforaminal epidural space.   Additional Comments:  The patient tolerated the procedure well Dressing: 2 x 2 sterile gauze and Band-Aid    Post-procedure details: Patient was observed during the procedure. Post-procedure instructions were reviewed.  Patient left the clinic in stable condition.

## 2023-06-23 NOTE — Patient Instructions (Signed)

## 2023-06-23 NOTE — Progress Notes (Signed)
 Donna Rios - 73 y.o. female MRN [id number]  Date of birth: 1950/12/31  Office Visit Note: Visit Date: 06/23/2023 PCP: No primary care provider on file. Referred by: Casilda Clayman, PA-C  Subjective: Chief Complaint  Patient presents with   Lower Back - Pain   HPI:  Donna Rios is a 73 y.o. female who comes in today at the request of Prentis Brock, PA-C for planned Left L4-5 Lumbar Transforaminal epidural steroid injection with fluoroscopic guidance.  The patient has failed conservative care including home exercise, medications, time and activity modification.  This injection will be diagnostic and hopefully therapeutic.  Please see requesting physician notes for further details and justification.   ROS Otherwise per HPI.  Assessment & Plan: Visit Diagnoses:    ICD-10-CM   1. Lumbar radiculopathy  M54.16 XR C-ARM NO REPORT    Epidural Steroid injection    methylPREDNISolone  acetate (DEPO-MEDROL ) injection 40 mg      Plan: No additional findings.   Meds & Orders:  Meds ordered this encounter  Medications   methylPREDNISolone  acetate (DEPO-MEDROL ) injection 40 mg    Orders Placed This Encounter  Procedures   XR C-ARM NO REPORT   Epidural Steroid injection    Follow-up: Return for visit to requesting provider as needed.   Procedures: No procedures performed  Lumbosacral Transforaminal Epidural Steroid Injection - Sub-Pedicular Approach with Fluoroscopic Guidance  Patient: Donna Rios      Date of Birth: [birth year] MRN: [id number] PCP: No primary care provider on file.      Visit Date: 06/23/2023   Universal Protocol:    Date/Time: 06/23/2023  Consent Given By: the patient  Position: PRONE  Additional Comments: Vital signs were monitored before and after the procedure. Patient was prepped and draped in the usual sterile fashion. The correct patient, procedure, and site was verified.   Injection Procedure Details:   Procedure diagnoses: Lumbar  radiculopathy [M54.16]    Meds Administered:  Meds ordered this encounter  Medications   methylPREDNISolone  acetate (DEPO-MEDROL ) injection 40 mg    Laterality: Left  Location/Site: L4  Needle:5.0 in., 22 ga.  Short bevel or Quincke spinal needle  Needle Placement: Transforaminal  Findings:    -Comments: Excellent flow of contrast along the nerve, nerve root and into the epidural space.  Procedure Details: After squaring off the end-plates to get a true AP view, the C-arm was positioned so that an oblique view of the foramen as noted above was visualized. The target area is just inferior to the "nose of the scotty dog" or sub pedicular. The soft tissues overlying this structure were infiltrated with 2-3 ml. of 1% Lidocaine  without Epinephrine .  The spinal needle was inserted toward the target using a "trajectory" view along the fluoroscope beam.  Under AP and lateral visualization, the needle was advanced so it did not puncture dura and was located close the 6 O'Clock position of the pedical in AP tracterory. Biplanar projections were used to confirm position. Aspiration was confirmed to be negative for CSF and/or blood. A 1-2 ml. volume of Isovue -250 was injected and flow of contrast was noted at each level. Radiographs were obtained for documentation purposes.   After attaining the desired flow of contrast documented above, a 0.5 to 1.0 ml test dose of 0.25% Marcaine  was injected into each respective transforaminal space.  The patient was observed for 90 seconds post injection.  After no sensory deficits were reported, and normal lower extremity motor function was noted,  the above injectate was administered so that equal amounts of the injectate were placed at each foramen (level) into the transforaminal epidural space.   Additional Comments:  The patient tolerated the procedure well Dressing: 2 x 2 sterile gauze and Band-Aid    Post-procedure details: Patient was observed  during the procedure. Post-procedure instructions were reviewed.  Patient left the clinic in stable condition.    Clinical History: MRI LUMBAR SPINE WITHOUT CONTRAST   TECHNIQUE: Multiplanar, multisequence MR imaging of the lumbar spine was performed. No intravenous contrast was administered.   COMPARISON:  08/02/2019 lumbar spine radiographs   FINDINGS: Segmentation:  Standard.   Alignment: Mild lumbar levoscoliosis. 3 mm anterolisthesis of L4 on L5.   Vertebrae: No fracture or suspicious osseous lesion. Mild degenerative endplate edema at Z61-W9.   Conus medullaris and cauda equina: Conus extends to the L1-2 level. Conus and cauda equina appear normal.   Paraspinal and other soft tissues: Unremarkable.   Disc levels:   Disc desiccation throughout the lumbar and lower thoracic spine. Mild disc space narrowing at T12-L1, L4-5, and L5-S1.   T11-12: Only imaged sagittally. Mild disc bulging and moderate facet arthrosis without evidence of significant stenosis.   T12-L1: Disc bulging mildly eccentric to the left and mild right and moderate left facet arthrosis without stenosis.   L1-2: Minimal disc bulging and mild facet and ligamentum flavum hypertrophy without stenosis.   L2-3: Mild disc bulging and mild facet and ligamentum flavum hypertrophy without stenosis.   L3-4: Mild disc bulging and moderate facet and ligamentum flavum hypertrophy without stenosis.   L4-5: Anterolisthesis with slight bulging of uncovered disc and severe facet and ligamentum flavum hypertrophy result in moderate to severe spinal stenosis without neural foraminal stenosis.   L5-S1: Disc bulging, endplate spurring, disc space height loss, and moderate left greater than right facet hypertrophy result in mild bilateral neural foraminal stenosis without spinal stenosis.   IMPRESSION: 1. Severe L4-5 facet arthrosis with grade 1 anterolisthesis and moderate to severe spinal stenosis. 2. Mild  bilateral neural foraminal stenosis at L5-S1.     Electronically Signed   By: Aundra Lee M.D.   On: 05/01/2020 10:25     Objective:  VS:  HT:    WT:   BMI:     BP:(!) 161/82  HR:71bpm  TEMP: ( )  RESP:  Physical Exam Vitals and nursing note reviewed.  Constitutional:      General: She is not in acute distress.    Appearance: Normal appearance. She is not ill-appearing.  HENT:     Head: Normocephalic and atraumatic.     Right Ear: External ear normal.     Left Ear: External ear normal.  Eyes:     Extraocular Movements: Extraocular movements intact.  Cardiovascular:     Rate and Rhythm: Normal rate.     Pulses: Normal pulses.  Pulmonary:     Effort: Pulmonary effort is normal. No respiratory distress.  Abdominal:     General: There is no distension.     Palpations: Abdomen is soft.  Musculoskeletal:        General: Tenderness present.     Cervical back: Neck supple.     Right lower leg: No edema.     Left lower leg: No edema.     Comments: Patient has good distal strength with no pain over the greater trochanters.  No clonus or focal weakness.  Skin:    Findings: No erythema, lesion or rash.  Neurological:  General: No focal deficit present.     Mental Status: She is alert and oriented to person, place, and time.     Sensory: No sensory deficit.     Motor: No weakness or abnormal muscle tone.     Coordination: Coordination normal.  Psychiatric:        Mood and Affect: Mood normal.        Behavior: Behavior normal.      Imaging: No results found.

## 2023-06-23 NOTE — Telephone Encounter (Signed)
 Sure hold PT for 2 weeks then start back thanks

## 2023-06-23 NOTE — Telephone Encounter (Signed)
-----   Message from Marueno S sent at 06/22/2023 10:42 AM EDT ----- Do we need to cxl the upcoming PT for 2 weeks?

## 2023-06-23 NOTE — Progress Notes (Signed)
 Pain Scale   Average Pain 10 Patient  advising she has chronic lower back pain radiating bilateral to both hips.        +Driver, -BT, -Dye Allergies.

## 2023-06-23 NOTE — Telephone Encounter (Signed)
 Can you please advise if you wanted her to hold on PT for 2 weeks and then resume?

## 2023-06-26 ENCOUNTER — Encounter: Payer: Self-pay | Admitting: Orthopedic Surgery
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
## 2023-06-27 ENCOUNTER — Ambulatory Visit (HOSPITAL_COMMUNITY)
Admission: RE | Admit: 2023-06-27 | Discharge: 2023-06-27 | Disposition: A | Discharge status: Home / Self Care | Source: Ambulatory Visit | Attending: Surgery | Admitting: Surgery

## 2023-06-27 ENCOUNTER — Encounter: Admitting: Rehabilitative and Restorative Service Providers"

## 2023-06-27 DIAGNOSIS — I872 Venous insufficiency (chronic) (peripheral): Secondary | ICD-10-CM

## 2023-06-29 ENCOUNTER — Encounter: Admitting: Rehabilitative and Restorative Service Providers"

## 2023-07-01 NOTE — Telephone Encounter (Signed)
 I called patient to schedule rov in 8 weeks with [facility] like you asked.   She wanted you to know that she has no refills left on her robaxin  and she uses that for pain relief.  She feels that the shot in her back helped with some of the pain, but she continues to have pain in her leg.  She said that her hip is great, however, she does still have some soreness.

## 2023-07-01 NOTE — Progress Notes (Signed)
 Appointment made

## 2023-07-05 ENCOUNTER — Encounter: Payer: Self-pay | Admitting: Rehabilitative and Restorative Service Providers"

## 2023-07-05 ENCOUNTER — Telehealth: Payer: Self-pay | Admitting: Physical Medicine and Rehabilitation

## 2023-07-05 ENCOUNTER — Ambulatory Visit: Admitting: Rehabilitative and Restorative Service Providers"

## 2023-07-05 DIAGNOSIS — M6281 Muscle weakness (generalized): Secondary | ICD-10-CM | POA: Diagnosis not present

## 2023-07-05 DIAGNOSIS — M25551 Pain in right hip: Secondary | ICD-10-CM

## 2023-07-05 DIAGNOSIS — M5459 Other low back pain: Secondary | ICD-10-CM

## 2023-07-05 DIAGNOSIS — M25552 Pain in left hip: Secondary | ICD-10-CM | POA: Diagnosis not present

## 2023-07-05 DIAGNOSIS — R600 Localized edema: Secondary | ICD-10-CM

## 2023-07-05 DIAGNOSIS — R262 Difficulty in walking, not elsewhere classified: Secondary | ICD-10-CM

## 2023-07-05 NOTE — Telephone Encounter (Signed)
 Patient was here. Says she is leaning a lot to her left side. Cause pain in center of her back. fyi
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
## 2023-07-07 ENCOUNTER — Encounter: Payer: Self-pay | Admitting: Rehabilitative and Restorative Service Providers"

## 2023-07-07 ENCOUNTER — Ambulatory Visit: Admitting: Rehabilitative and Restorative Service Providers"

## 2023-07-07 DIAGNOSIS — M6281 Muscle weakness (generalized): Secondary | ICD-10-CM | POA: Diagnosis not present

## 2023-07-07 DIAGNOSIS — M5459 Other low back pain: Secondary | ICD-10-CM | POA: Diagnosis not present

## 2023-07-07 DIAGNOSIS — M25552 Pain in left hip: Secondary | ICD-10-CM

## 2023-07-07 DIAGNOSIS — M25551 Pain in right hip: Secondary | ICD-10-CM | POA: Diagnosis not present

## 2023-07-07 DIAGNOSIS — R262 Difficulty in walking, not elsewhere classified: Secondary | ICD-10-CM

## 2023-07-07 DIAGNOSIS — R600 Localized edema: Secondary | ICD-10-CM
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
## 2023-07-13 ENCOUNTER — Encounter: Payer: Self-pay | Admitting: Rehabilitative and Restorative Service Providers"

## 2023-07-13 ENCOUNTER — Ambulatory Visit: Admitting: Rehabilitative and Restorative Service Providers"

## 2023-07-13 DIAGNOSIS — M6281 Muscle weakness (generalized): Secondary | ICD-10-CM | POA: Diagnosis not present

## 2023-07-13 DIAGNOSIS — R262 Difficulty in walking, not elsewhere classified: Secondary | ICD-10-CM

## 2023-07-13 DIAGNOSIS — M5459 Other low back pain: Secondary | ICD-10-CM | POA: Diagnosis not present

## 2023-07-13 DIAGNOSIS — M25552 Pain in left hip: Secondary | ICD-10-CM | POA: Diagnosis not present

## 2023-07-13 DIAGNOSIS — M25551 Pain in right hip: Secondary | ICD-10-CM | POA: Diagnosis not present

## 2023-07-13 DIAGNOSIS — R600 Localized edema: Secondary | ICD-10-CM
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
## 2023-07-14 ENCOUNTER — Ambulatory Visit: Payer: Self-pay

## 2023-07-14 NOTE — Telephone Encounter (Signed)
 Called pt and gave her the information. I did advise her that is she see's any signs of infection to go to [facility] as we do not have any appointment in office until next week

## 2023-07-14 NOTE — Telephone Encounter (Signed)
 FYI Only or Action Required?: Action required by provider: requesting PCP advice on if she needs to come in for tetanus booster - asks for return call.  Patient was last seen in primary care on 04/27/2023 by Bluford Burkitt, NP. Called Nurse Triage reporting Cat Scratch. Symptoms began several days ago. Interventions attempted: OTC medications: peroxide, triple antibiotic ointment. Symptoms are: unchanged.  Triage Disposition: Home Care  Patient/caregiver understands and will follow disposition?: Yes       Copied from CRM [id number]. Topic: Clinical - Red Word Triage >> Jul 14, 2023  8:24 AM Alyse July wrote: Red Word that prompted transfer to Nurse Triage: Cat Scratch(Bleeding) Reason for Disposition  Minor cut or scratch  Answer Assessment - Initial Assessment Questions Patient educated to call back if she has any redness at all, any mild fever, oozing from scratch. Patient is sure that cat doesn't have rabies. Last tetanus in 2018.  *Patient wants know if she should come in for an updated tetanus booster since her last tetanus was 2018. Please call her at [phone].   1. APPEARANCE of INJURY: What does the injury look like?      Cat scratch while she was trying to keep cats ear 2. SIZE: How large is the cut?      About half size of dime 3. BLEEDING: Is it bleeding now? If Yes, ask: Is it difficult to stop?      The bleeding has stopped and it has closed  4. LOCATION: Where is the injury located?      On right hand 5. ONSET: How long ago did the injury occur?      Tuesday 6. MECHANISM: Tell me how it happened.      Trying to clean the cat's ears but lives outside and not vaccinated 7. TETANUS: When was the last tetanus booster? 2018      Denies: swelling of hand, fever, redness.  Protocols used: Cuts and Lacerations-A-AH

## 2023-07-15 ENCOUNTER — Ambulatory Visit: Admitting: Rehabilitative and Restorative Service Providers"

## 2023-07-15 ENCOUNTER — Encounter: Payer: Self-pay | Admitting: Rehabilitative and Restorative Service Providers"

## 2023-07-15 DIAGNOSIS — R262 Difficulty in walking, not elsewhere classified: Secondary | ICD-10-CM

## 2023-07-15 DIAGNOSIS — M25552 Pain in left hip: Secondary | ICD-10-CM

## 2023-07-15 DIAGNOSIS — M5459 Other low back pain: Secondary | ICD-10-CM | POA: Diagnosis not present

## 2023-07-15 DIAGNOSIS — M25551 Pain in right hip: Secondary | ICD-10-CM

## 2023-07-15 DIAGNOSIS — R600 Localized edema: Secondary | ICD-10-CM

## 2023-07-15 DIAGNOSIS — M6281 Muscle weakness (generalized): Secondary | ICD-10-CM
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
## 2023-07-18 ENCOUNTER — Telehealth: Payer: Self-pay | Admitting: Cardiovascular Disease

## 2023-07-18 ENCOUNTER — Other Ambulatory Visit: Payer: Self-pay | Admitting: *Deleted

## 2023-07-18 ENCOUNTER — Other Ambulatory Visit: Payer: Self-pay | Admitting: Nurse Practitioner

## 2023-07-18 ENCOUNTER — Telehealth: Payer: Self-pay

## 2023-07-18 DIAGNOSIS — Z952 Presence of prosthetic heart valve: Secondary | ICD-10-CM

## 2023-07-18 NOTE — Telephone Encounter (Unsigned)
 Copied from CRM [id number]. Topic: Clinical - Medication Refill >> Jul 18, 2023 11:08 AM Chiquita SQUIBB wrote: Medication: apixaban  (ELIQUIS ) 5 MG TABS tablet [[id number]]  Patient is requesting 90 days  Has the patient contacted their pharmacy? Yes- stated they needed to contact the doctors due to the doctor not being there anymore. (Agent: If no, request that the patient contact the pharmacy for the refill. If patient does not wish to contact the pharmacy document the reason why and proceed with request.) (Agent: If yes, when and what did the pharmacy advise?)  This is the patient's preferred pharmacy:  [facility] [address] [place] - [address] [place] [address] Phone: [phone] Fax: [phone]  Is this the correct pharmacy for this prescription? Yes If no, delete pharmacy and type the correct one.   Has the prescription been filled recently? No  Is the patient out of the medication? Yes- Patient has a few left for this week  Has the patient been seen for an appointment in the last year OR does the patient have an upcoming appointment? Yes  Can we respond through MyChart? Yes  Agent: Please be advised that Rx refills may take up to 3 business days. We ask that you follow-up with your pharmacy.

## 2023-07-18 NOTE — Telephone Encounter (Signed)
 Copied from CRM [id number]. Topic: Clinical - Medical Advice >> Jul 18, 2023 11:12 AM Donna Rios wrote: Reason for CRM: Patient is asking if the cat scratch fever can cause an infection with her new heart valve, patient stated it is looking better and has not gotten any redder.

## 2023-07-18 NOTE — Telephone Encounter (Signed)
 Spoke with pt today.  States that she made an appointment to see Dr. Gollan on July 7.  She was inquiring if an US  appointment had been made because she normally has one prior to seeing Dr. Gollan.  She did not want to make 2 trips.  I checked appointment and explained to her that no US  had been ordered. She also had concern regarding her Eliquis  prescription. States that [facility] states she needs to be seen, but states that her primary care office is working on refilling it.  I told her to call back if there were any problems receiving her medications.  Pt verbalized understanding and thanked me for calling back

## 2023-07-18 NOTE — Telephone Encounter (Signed)
 Patient calling to see if she is suppose to have an ultrasound done. Please advise

## 2023-07-19 NOTE — Addendum Note (Signed)
 Addended by: CRISTOPHER OLIVIA PARAS on: 07/19/2023 08:44 AM   Modules accepted: Orders

## 2023-07-19 NOTE — Telephone Encounter (Signed)
 Called patient and notified her of the following from Dr. Gollan.  She has TAVR valve, we can order an echo for 2025 prior to office visit if she would like. It is possible if clinical exam, valve sounds good she may not need an echo this year may be able to skip a year.  I suspect we will end up getting to every other year on the valve echo Thx TGollan   Patient verbalizes understanding. Patient states that she would prefer to have an echocardiogram. Order placed.

## 2023-07-20 ENCOUNTER — Telehealth: Payer: Self-pay | Admitting: Cardiovascular Disease

## 2023-07-20 NOTE — Telephone Encounter (Signed)
 Pt c/o medication issue:  1. Name of Medication:   apixaban  (ELIQUIS ) 5 MG TABS tablet    2. How are you currently taking this medication (dosage and times per day)? As written  3. Are you having a reaction (difficulty breathing--STAT)? no  4. What is your medication issue? Pt wants Dr Gollan to start refilling this again instead of PCP. Also [employer] is giving her a hard time about taking medication because something is wrong with on of her valves and say she shouldn't be taking this medication.  If we refill [facility] to : [id number] [place], [place] - [address] Phone: [phone]  Fax: [phone]

## 2023-07-21 ENCOUNTER — Encounter: Payer: Self-pay | Admitting: Nurse Practitioner

## 2023-07-21 ENCOUNTER — Ambulatory Visit: Admitting: Nurse Practitioner

## 2023-07-21 VITALS — BP 124/82 | HR 74 | Temp 98.0°F | Ht 69.0 in | Wt 160.0 lb

## 2023-07-21 DIAGNOSIS — I10 Essential (primary) hypertension: Secondary | ICD-10-CM

## 2023-07-21 DIAGNOSIS — L089 Local infection of the skin and subcutaneous tissue, unspecified: Secondary | ICD-10-CM

## 2023-07-21 MED ORDER — AMOXICILLIN-POT CLAVULANATE 875-125 MG PO TABS: 1.0000 | ORAL_TABLET | Freq: Two times a day (BID) | ORAL | 0 refills | Status: DC

## 2023-07-21 MED ORDER — ROSUVASTATIN CALCIUM 40 MG PO TABS: 40.0000 mg | ORAL_TABLET | Freq: Every day | ORAL | 0 refills | Status: DC

## 2023-07-21 MED ORDER — LOSARTAN POTASSIUM 50 MG PO TABS
50.0000 mg | ORAL_TABLET | Freq: Every day | ORAL | 0 refills | Status: DC
Start: 2023-07-21 — End: 2023-09-12

## 2023-07-21 MED ORDER — AMOXICILLIN 500 MG PO CAPS: 2000.0000 mg | ORAL_CAPSULE | Freq: Once | ORAL | 0 refills | Status: AC

## 2023-07-21 NOTE — Progress Notes (Signed)
 Established Patient Office Visit  Subjective:  Patient ID: Donna Rios, female    DOB: 05/27/1950  Age: 73 y.o. MRN: [id number]  CC:  Chief Complaint  Patient presents with   Acute Visit    Cat scratch x 10 days   Discussed the use of a AI scribe software for clinical note transcription with the patient, who gave verbal consent to proceed.  HPI Donna Rios is a 73 year old female on blood thinners who presents with a cat scratch on her right hand.  She sustained the scratch while caring for a feral cat with an ear hematoma. Initial bleeding was controlled with hydrogen peroxide and gauze. She applies triple antibiotic ointment and covers the wound with bandages. The wound is more red than a week ago but has not spread. There is no pain at the site, and bleeding has stopped. She is cautious about using her right hand and keeps the wound covered outside to prevent infection.  She needs refill for losartan , crestor  and  amoxicillin  (dental treatment).  HPI   Past Medical History:  Diagnosis Date   Allergic rhinitis    Anemia    hx of   Anxiety    Arthritis    COPD (chronic obstructive pulmonary disease) (HCC)    Dysrhythmia    PAF 07/2018 Zio monitor   Fatty liver    Hypertension    Inguinal hernia, left    Phlebitis    S/P TAVR (transcatheter aortic valve replacement)    Severe aortic stenosis    Stress incontinence    Tobacco abuse     Past Surgical History:  Procedure Laterality Date   ABDOMINAL HYSTERECTOMY     BREAST BIOPSY Left    ducts removed   Cataract surgery     COLONOSCOPY WITH PROPOFOL  N/A 09/29/2017   Procedure: COLONOSCOPY WITH PROPOFOL ;  Surgeon: Unk Corinn Skiff, MD;  Location: [facility] ENDOSCOPY;  Service: Gastroenterology;  Laterality: N/A;   EYE SURGERY     FOOT SURGERY Left    7   INTRAOPERATIVE TRANSTHORACIC ECHOCARDIOGRAM N/A 07/11/2018   Procedure: Intraoperative Transthoracic Echocardiogram;  Surgeon: Wonda Sharper, MD;   Location: [facility] OR;  Service: Open Heart Surgery;  Laterality: N/A;   KNEE ARTHROSCOPY Right    LAPAROSCOPIC HYSTERECTOMY     RIGHT HEART CATH AND CORONARY ANGIOGRAPHY N/A 06/15/2018   Procedure: RIGHT HEART CATH AND CORONARY ANGIOGRAPHY;  Surgeon: Wonda Sharper, MD;  Location: [facility] INVASIVE CV LAB;  Service: Cardiovascular;  Laterality: N/A;   TOOTH EXTRACTION  02/15/2023   TOTAL HIP ARTHROPLASTY Left 04/04/2023   Procedure: LEFT TOTAL HIP ARTHROPLASTY ANTERIOR APPROACH;  Surgeon: Addie Cordella Hamilton, MD;  Location: [facility] OR;  Service: Orthopedics;  Laterality: Left;   TRANSCATHETER AORTIC VALVE REPLACEMENT, TRANSFEMORAL  07/11/2018   TRANSCATHETER AORTIC VALVE REPLACEMENT, TRANSFEMORAL N/A 07/11/2018   Procedure: TRANSCATHETER AORTIC VALVE REPLACEMENT, TRANSFEMORAL;  Surgeon: Wonda Sharper, MD;  Location: [facility] OR;  Service: Open Heart Surgery;  Laterality: N/A;   VARICOSE VEIN SURGERY Right     Family History  Problem Relation Age of Onset   Alcoholism Other    Arthritis Other    Lung cancer Other    Heart disease Other    Stroke Other    Hypertension Other    Diabetes Other    Heart failure Mother    Hypertension Mother    Diabetes Mother    Heart failure Father    Hypotension Father    Breast cancer Neg Hx  Social History   Socioeconomic History   Marital status: Married    Spouse name: Silver   Number of children: 0   Years of education: Not on file   Highest education level: Not on file  Occupational History   Occupation: retired Producer, television/film/video  Tobacco Use   Smoking status: Every Day    Current packs/day: 0.25    Average packs/day: 0.5 packs/day for 54.5 years (27.2 ttl pk-yrs)    Types: Cigarettes    Start date: 1971    Passive exposure: Past   Smokeless tobacco: Never  Vaping Use   Vaping status: Never Used  Substance and Sexual Activity   Alcohol use: Yes    Comment: 1 beer monthly or less   Drug use: No   Sexual activity: Not on file  Other Topics  Concern   Not on file  Social History Narrative   Not on file   Social Drivers of Health   Financial Resource Strain: Low Risk  (09/16/2022)   Overall Financial Resource Strain (CARDIA)    Difficulty of Paying Living Expenses: Not hard at all  Food Insecurity: No Food Insecurity (04/05/2023)   Hunger Vital Sign    Worried About Running Out of Food in the Last Year: Never true    Ran Out of Food in the Last Year: Never true  Transportation Needs: No Transportation Needs (04/05/2023)   PRAPARE - Administrator, Civil Service (Medical): No    Lack of Transportation (Non-Medical): No  Physical Activity: Inactive (09/16/2022)   Exercise Vital Sign    Days of Exercise per Week: 0 days    Minutes of Exercise per Session: 0 min  Stress: No Stress Concern Present (09/16/2022)   [employer] of Occupational Health - Occupational Stress Questionnaire    Feeling of Stress : Only a little  Social Connections: Moderately Isolated (04/05/2023)   Social Connection and Isolation Panel    Frequency of Communication with Friends and Family: More than three times a week    Frequency of Social Gatherings with Friends and Family: Once a week    Attends Religious Services: Never    Database administrator or Organizations: No    Attends Banker Meetings: Never    Marital Status: Married  Catering manager Violence: Not At Risk (04/05/2023)   Humiliation, Afraid, Rape, and Kick questionnaire    Fear of Current or Ex-Partner: No    Emotionally Abused: No    Physically Abused: No    Sexually Abused: No     Outpatient Medications Prior to Visit  Medication Sig Dispense Refill   acetaminophen  (TYLENOL  8 HOUR ARTHRITIS PAIN) 650 MG CR tablet Take 650 mg by mouth 2 (two) times daily.     Apple Cider Vinegar 500 MG TABS Take 500 mg by mouth daily as needed (with fatty foods).     Calcium -Magnesium -Vitamin D (CALCIUM  1200+D3 PO) Take 1 tablet by mouth daily.     cetirizine (ZYRTEC)  10 MG tablet Take 10 mg by mouth daily as needed for allergies.     docusate sodium  (COLACE) 100 MG capsule Take 1 capsule (100 mg total) by mouth 2 (two) times daily. 10 capsule 0   doxycycline  (VIBRA -TABS) 100 MG tablet Take 1 tablet (100 mg total) by mouth 2 (two) times daily. (Patient not taking: Reported on 08/01/2023) 14 tablet 0   Elastic Bandages & Supports (KNEE BRACE/HINGED BARS MEDIUM) MISC Use daily while awake for knee support. Dx code  M25.562, G89.29 1 each 0   fluticasone (FLONASE) 50 MCG/ACT nasal spray Place 1 spray into both nostrils daily as needed for allergies.      Lidocaine  4 % SOLN Apply 1 application  topically daily as needed (Knee pain/Back Pain).     methocarbamol  (ROBAXIN ) 500 MG tablet Take 1 tablet (500 mg total) by mouth every 8 (eight) hours as needed for muscle spasms. 30 tablet 2   Multiple Vitamin (MULTIVITAMIN WITH MINERALS) TABS tablet Take 1 tablet by mouth every other day. Woman Centrum 50+     polyethylene glycol (MIRALAX  / GLYCOLAX ) 17 g packet Take 17 g by mouth 2 (two) times daily.     Probiotic Product (PROBIOTIC ADVANCED PO) Take 1 tablet by mouth every other day.     Propylene Glycol (SYSTANE COMPLETE OP) Place 1 drop into both eyes 3 (three) times daily as needed (dry/irritated eyes.).      amoxicillin  (AMOXIL ) 500 MG capsule Take 2,000 mg by mouth once.     apixaban  (ELIQUIS ) 5 MG TABS tablet Take 1 tablet (5 mg total) by mouth 2 (two) times daily. 28 tablet    ELIQUIS  5 MG TABS tablet TAKE 1 TABLET BY MOUTH TWICE A DAY 180 tablet 0   losartan  (COZAAR ) 50 MG tablet TAKE 1 TABLET BY MOUTH EVERY DAY 90 tablet 0   rosuvastatin  (CRESTOR ) 40 MG tablet Take 1 tablet (40 mg total) by mouth daily. 90 tablet 3   oxyCODONE  (OXY IR/ROXICODONE ) 5 MG immediate release tablet Take 1 tablet (5 mg total) by mouth every 4 (four) hours as needed for moderate pain (pain score 4-6). 30 tablet 0   No facility-administered medications prior to visit.    Allergies   Allergen Reactions   Ibuprofen Other (See Comments)    High does causes stomach pains   Dorethia Silk ] Other (See Comments)    UNSPECIFIED REACTION    Iohexol  Rash    Pt had reaction after 06/21/18 TAVR CT scans   Prednisone  Palpitations    Oral    ROS Review of Systems Negative unless indicated in HPI.    Objective:    Physical Exam Constitutional:      Appearance: Normal appearance.  Cardiovascular:     Pulses: Normal pulses.     Heart sounds: Normal heart sounds.  Pulmonary:     Effort: Pulmonary effort is normal.     Breath sounds: Normal breath sounds.  Skin:    Findings: Erythema (Right hand) present.  Neurological:     General: No focal deficit present.     Mental Status: She is alert and oriented to person, place, and time.  Psychiatric:        Mood and Affect: Mood normal.        Behavior: Behavior normal.     BP 124/82   Pulse 74   Temp 98 F (36.7 C)   Ht 5' 9 (1.753 m)   Wt 160 lb (72.6 kg)   SpO2 99%   BMI 23.63 kg/m  Wt Readings from Last 3 Encounters:  08/01/23 161 lb 8 oz (73.3 kg)  07/25/23 161 lb 6.4 oz (73.2 kg)  07/21/23 160 lb (72.6 kg)     Health Maintenance  Topic Date Due   MAMMOGRAM  05/26/2023   Medicare Annual Wellness (AWV)  09/16/2023   COVID-19 Vaccine (8 - Moderna risk 2024-25 season) 08/06/2023 (Originally 04/26/2023)   INFLUENZA VACCINE  08/26/2023   Lung Cancer Screening  12/07/2023   DEXA SCAN  12/13/2024   DTaP/Tdap/Td (3 - Td or Tdap) 05/07/2026   Colonoscopy  09/30/2027   Pneumococcal Vaccine: 50+ Years  Completed   Hepatitis C Screening  Completed   Zoster Vaccines- Shingrix  Completed   Hepatitis B Vaccines  Aged Out   HPV VACCINES  Aged Out   Meningococcal B Vaccine  Aged Out   Fecal DNA (Cologuard)  Discontinued    There are no preventive care reminders to display for this patient.  Lab Results  Component Value Date   TSH 0.81 10/16/2018   Lab Results  Component Value Date   WBC 7.8 08/01/2023    HGB 12.3 08/01/2023   HCT 37.7 08/01/2023   MCV 94 08/01/2023   PLT 196 08/01/2023   Lab Results  Component Value Date   NA 138 03/11/2023   K 4.1 03/11/2023   CO2 28 03/11/2023   GLUCOSE 98 03/11/2023   BUN 9 03/11/2023   CREATININE 0.69 03/11/2023   BILITOT 0.6 03/11/2023   ALKPHOS 80 03/11/2023   AST 21 03/11/2023   ALT 16 03/11/2023   PROT 7.0 03/11/2023   ALBUMIN 4.4 03/11/2023   CALCIUM  9.4 03/11/2023   ANIONGAP 8 07/25/2018   GFR 86.63 03/11/2023   Lab Results  Component Value Date   CHOL 134 10/25/2022   Lab Results  Component Value Date   HDL 66.20 10/25/2022   Lab Results  Component Value Date   LDLCALC 52 10/25/2022   Lab Results  Component Value Date   TRIG 80.0 10/25/2022   Lab Results  Component Value Date   CHOLHDL 2 10/25/2022   Lab Results  Component Value Date   HGBA1C 6.0 03/11/2023      Assessment & Plan:  Skin infection Assessment & Plan: Sustained a cat scratch on the right hand. Initial bleeding improved. Slight swelling and redness likely due to healing and adhesive reaction. No pain or active bleeding. Wound sealed. Augmentin  prescribed to prevent infection. - Discontinue peroxide; clean with soap and water . - Prescribe Augmentin  for 7 days, take with food and probiotics. - Keep wound open to air unless outside, then cover.   Essential hypertension -     Losartan  Potassium; Take 1 tablet (50 mg total) by mouth daily.  Dispense: 90 tablet; Refill: 0  Other orders -     Rosuvastatin  Calcium ; Take 1 tablet (40 mg total) by mouth daily.  Dispense: 90 tablet; Refill: 0 -     Amoxicillin ; Take 4 capsules (2,000 mg total) by mouth once for 1 dose.  Dispense: 4 capsule; Refill: 0 -     Amoxicillin -Pot Clavulanate; Take 1 tablet by mouth 2 (two) times daily. (Patient not taking: Reported on 08/01/2023)  Dispense: 14 tablet; Refill: 0    Follow-up: No follow-ups on file.   Shareece Bultman, NP

## 2023-07-21 NOTE — Patient Instructions (Addendum)
 CAT SCRATCH WOUND: You have a cat scratch on your right hand that is slightly swollen and red but not painful or actively bleeding. -Stop using hydrogen peroxide; clean the wound with soap and water  instead. -Take Augmentin  for 7 days to prevent infection. Make sure to take it with food and probiotics. -Keep the wound open to air unless you are outside, then cover it with a bandage.  Refilled amoxicillin  for dental procedure, losartan  and crestor 

## 2023-07-24 ENCOUNTER — Other Ambulatory Visit: Payer: Self-pay | Admitting: Cardiovascular Disease

## 2023-07-24 MED ORDER — APIXABAN 5 MG PO TABS: 5.0000 mg | ORAL_TABLET | Freq: Two times a day (BID) | ORAL | 1 refills | Status: DC

## 2023-07-25 ENCOUNTER — Ambulatory Visit: Attending: Surgery | Admitting: Physician Assistant

## 2023-07-25 ENCOUNTER — Telehealth: Payer: Self-pay

## 2023-07-25 ENCOUNTER — Other Ambulatory Visit (HOSPITAL_COMMUNITY): Payer: Self-pay

## 2023-07-25 VITALS — BP 143/85 | HR 73 | Temp 98.3°F | Ht 69.0 in | Wt 161.4 lb

## 2023-07-25 DIAGNOSIS — M7989 Other specified soft tissue disorders: Secondary | ICD-10-CM

## 2023-07-25 DIAGNOSIS — F17200 Nicotine dependence, unspecified, uncomplicated: Secondary | ICD-10-CM | POA: Diagnosis not present

## 2023-07-25 DIAGNOSIS — I872 Venous insufficiency (chronic) (peripheral): Secondary | ICD-10-CM | POA: Diagnosis not present

## 2023-07-25 NOTE — Telephone Encounter (Signed)
 Pharmacy Patient Advocate Encounter  Insurance verification completed.   The patient is insured through [employer]   Ran test claim for ELIQUIS . Currently a quantity of 60 is a 30 day supply and the co-pay is $40 . The current 30 day co-pay is, $40.  No PA needed at this time.  This test claim was processed through [facility]- copay amounts may vary at other pharmacies due to pharmacy/plan contracts, or as the patient moves through the different stages of their insurance plan.

## 2023-07-25 NOTE — Telephone Encounter (Signed)
 Pharmacy Patient Advocate Encounter   Insurance verification completed.   The patient is insured through [employer]    Ran test claim for ELIQUIS . Currently a quantity of 60 is a 30 day supply and the co-pay is $40 . The current 30 day co-pay is, $40.  No PA needed at this time.   This test claim was processed through [facility]- copay amounts may vary at other pharmacies due to pharmacy/plan contracts, or as the patient moves through the different stages of their insurance plan.

## 2023-07-25 NOTE — Progress Notes (Signed)
 Office Note     CC:  follow up Requesting Provider:  Valdemar Rocky SAUNDERS, NP  HPI: Donna Rios is a 74 y.o. ([birth year]) female who presents for evaluation of left lower extremity edema with pain.  She had a left hip replacement in March of this year.  She noticed significant edema postoperatively more so than preoperatively.  Fortunately the edema seems to be improving as she rehabs from her surgery.  For years she has had discoloration of her ankles and feet with superficial veins.  She does not wear compression as she is unable to put these on due to her back pain.  She elevates her legs periodically during the day.  She is ambulatory with a walker.  She is an everyday smoker with no interest in quitting.  Years ago she had stripping of a vein in her right leg at [facility].   Past Medical History:  Diagnosis Date   Allergic rhinitis    Anemia    hx of   Anxiety    Arthritis    COPD (chronic obstructive pulmonary disease) (HCC)    Dysrhythmia    PAF 07/2018 Zio monitor   Fatty liver    Hypertension    Inguinal hernia, left    Phlebitis    S/P TAVR (transcatheter aortic valve replacement)    Severe aortic stenosis    Stress incontinence    Tobacco abuse     Past Surgical History:  Procedure Laterality Date   ABDOMINAL HYSTERECTOMY     BREAST BIOPSY Left    ducts removed   Cataract surgery     COLONOSCOPY WITH PROPOFOL  N/A 09/29/2017   Procedure: COLONOSCOPY WITH PROPOFOL ;  Surgeon: Unk Corinn Skiff, MD;  Location: [facility] ENDOSCOPY;  Service: Gastroenterology;  Laterality: N/A;   EYE SURGERY     FOOT SURGERY Left    7   INTRAOPERATIVE TRANSTHORACIC ECHOCARDIOGRAM N/A 07/11/2018   Procedure: Intraoperative Transthoracic Echocardiogram;  Surgeon: Wonda Sharper, MD;  Location: [facility] OR;  Service: Open Heart Surgery;  Laterality: N/A;   KNEE ARTHROSCOPY Right    LAPAROSCOPIC HYSTERECTOMY     RIGHT HEART CATH AND CORONARY ANGIOGRAPHY N/A 06/15/2018   Procedure: RIGHT HEART  CATH AND CORONARY ANGIOGRAPHY;  Surgeon: Wonda Sharper, MD;  Location: [facility] INVASIVE CV LAB;  Service: Cardiovascular;  Laterality: N/A;   TOOTH EXTRACTION  02/15/2023   TOTAL HIP ARTHROPLASTY Left 04/04/2023   Procedure: LEFT TOTAL HIP ARTHROPLASTY ANTERIOR APPROACH;  Surgeon: Addie Cordella Hamilton, MD;  Location: [facility] OR;  Service: Orthopedics;  Laterality: Left;   TRANSCATHETER AORTIC VALVE REPLACEMENT, TRANSFEMORAL  07/11/2018   TRANSCATHETER AORTIC VALVE REPLACEMENT, TRANSFEMORAL N/A 07/11/2018   Procedure: TRANSCATHETER AORTIC VALVE REPLACEMENT, TRANSFEMORAL;  Surgeon: Wonda Sharper, MD;  Location: [facility] OR;  Service: Open Heart Surgery;  Laterality: N/A;   VARICOSE VEIN SURGERY Right     Social History   Socioeconomic History   Marital status: Married    Spouse name: Silver   Number of children: 0   Years of education: Not on file   Highest education level: Not on file  Occupational History   Occupation: retired Producer, television/film/video  Tobacco Use   Smoking status: Every Day    Current packs/day: 0.25    Average packs/day: 0.5 packs/day for 54.5 years (27.2 ttl pk-yrs)    Types: Cigarettes    Start date: 1971    Passive exposure: Past   Smokeless tobacco: Never  Vaping Use   Vaping status: Never Used  Substance  and Sexual Activity   Alcohol use: Yes    Comment: 1 beer monthly or less   Drug use: No   Sexual activity: Not on file  Other Topics Concern   Not on file  Social History Narrative   Not on file   Social Drivers of Health   Financial Resource Strain: Low Risk  (09/16/2022)   Overall Financial Resource Strain (CARDIA)    Difficulty of Paying Living Expenses: Not hard at all  Food Insecurity: No Food Insecurity (04/05/2023)   Hunger Vital Sign    Worried About Running Out of Food in the Last Year: Never true    Ran Out of Food in the Last Year: Never true  Transportation Needs: No Transportation Needs (04/05/2023)   PRAPARE - Scientist, research (physical sciences) (Medical): No    Lack of Transportation (Non-Medical): No  Physical Activity: Inactive (09/16/2022)   Exercise Vital Sign    Days of Exercise per Week: 0 days    Minutes of Exercise per Session: 0 min  Stress: No Stress Concern Present (09/16/2022)   [employer] of Occupational Health - Occupational Stress Questionnaire    Feeling of Stress : Only a little  Social Connections: Moderately Isolated (04/05/2023)   Social Connection and Isolation Panel    Frequency of Communication with Friends and Family: More than three times a week    Frequency of Social Gatherings with Friends and Family: Once a week    Attends Religious Services: Never    Database administrator or Organizations: No    Attends Banker Meetings: Never    Marital Status: Married  Catering manager Violence: Not At Risk (04/05/2023)   Humiliation, Afraid, Rape, and Kick questionnaire    Fear of Current or Ex-Partner: No    Emotionally Abused: No    Physically Abused: No    Sexually Abused: No    Family History  Problem Relation Age of Onset   Alcoholism Other    Arthritis Other    Lung cancer Other    Heart disease Other    Stroke Other    Hypertension Other    Diabetes Other    Heart failure Mother    Hypertension Mother    Diabetes Mother    Heart failure Father    Hypotension Father    Breast cancer Neg Hx     Current Outpatient Medications  Medication Sig Dispense Refill   acetaminophen  (TYLENOL  8 HOUR ARTHRITIS PAIN) 650 MG CR tablet Take 650 mg by mouth 2 (two) times daily.     amoxicillin -clavulanate (AUGMENTIN ) 875-125 MG tablet Take 1 tablet by mouth 2 (two) times daily. 14 tablet 0   apixaban  (ELIQUIS ) 5 MG TABS tablet Take 1 tablet (5 mg total) by mouth 2 (two) times daily. 180 tablet 1   Apple Cider Vinegar 500 MG TABS Take 500 mg by mouth daily as needed (with fatty foods).     Calcium -Magnesium -Vitamin D (CALCIUM  1200+D3 PO) Take 1 tablet by mouth daily.      cetirizine (ZYRTEC) 10 MG tablet Take 10 mg by mouth daily as needed for allergies.     docusate sodium  (COLACE) 100 MG capsule Take 1 capsule (100 mg total) by mouth 2 (two) times daily. 10 capsule 0   doxycycline  (VIBRA -TABS) 100 MG tablet Take 1 tablet (100 mg total) by mouth 2 (two) times daily. 14 tablet 0   Elastic Bandages & Supports (KNEE BRACE/HINGED BARS MEDIUM) MISC Use daily while awake  for knee support. Dx code M25.562, G89.29 1 each 0   fluticasone (FLONASE) 50 MCG/ACT nasal spray Place 1 spray into both nostrils daily as needed for allergies.      Lidocaine  4 % SOLN Apply 1 application  topically daily as needed (Knee pain/Back Pain).     losartan  (COZAAR ) 50 MG tablet Take 1 tablet (50 mg total) by mouth daily. 90 tablet 0   methocarbamol  (ROBAXIN ) 500 MG tablet Take 1 tablet (500 mg total) by mouth every 8 (eight) hours as needed for muscle spasms. 30 tablet 2   Multiple Vitamin (MULTIVITAMIN WITH MINERALS) TABS tablet Take 1 tablet by mouth every other day. Woman Centrum 50+     oxyCODONE  (OXY IR/ROXICODONE ) 5 MG immediate release tablet Take 1 tablet (5 mg total) by mouth every 4 (four) hours as needed for moderate pain (pain score 4-6). 30 tablet 0   polyethylene glycol (MIRALAX  / GLYCOLAX ) 17 g packet Take 17 g by mouth 2 (two) times daily.     Probiotic Product (PROBIOTIC ADVANCED PO) Take 1 tablet by mouth every other day.     Propylene Glycol (SYSTANE COMPLETE OP) Place 1 drop into both eyes 3 (three) times daily as needed (dry/irritated eyes.).      rosuvastatin  (CRESTOR ) 40 MG tablet Take 1 tablet (40 mg total) by mouth daily. 90 tablet 0   No current facility-administered medications for this visit.    Allergies  Allergen Reactions   Ibuprofen Other (See Comments)    High does causes stomach pains   Dorethia Chamber ] Other (See Comments)    UNSPECIFIED REACTION    Iohexol  Rash    Pt had reaction after 06/21/18 TAVR CT scans   Prednisone  Palpitations    Oral      REVIEW OF SYSTEMS:   [X]  denotes positive finding, [ ]  denotes negative finding Cardiac  Comments:  Chest pain or chest pressure:    Shortness of breath upon exertion:    Short of breath when lying flat:    Irregular heart rhythm:        Vascular    Pain in calf, thigh, or hip brought on by ambulation:    Pain in feet at night that wakes you up from your sleep:     Blood clot in your veins:    Leg swelling:         Pulmonary    Oxygen at home:    Productive cough:     Wheezing:         Neurologic    Sudden weakness in arms or legs:     Sudden numbness in arms or legs:     Sudden onset of difficulty speaking or slurred speech:    Temporary loss of vision in one eye:     Problems with dizziness:         Gastrointestinal    Blood in stool:     Vomited blood:         Genitourinary    Burning when urinating:     Blood in urine:        Psychiatric    Major depression:         Hematologic    Bleeding problems:    Problems with blood clotting too easily:        Skin    Rashes or ulcers:        Constitutional    Fever or chills:      PHYSICAL EXAMINATION:  Vitals:  07/25/23 0824  BP: (!) 143/85  Pulse: 73  Temp: 98.3 F (36.8 C)  TempSrc: Temporal  SpO2: 98%  Weight: 161 lb 6.4 oz (73.2 kg)  Height: 5' 9 (1.753 m)    General:  WDWN in NAD; vital signs documented above Gait: Not observed HENT: WNL, normocephalic Pulmonary: normal non-labored breathing Cardiac: regular HR Abdomen: soft, NT, no masses Skin: without rashes Vascular Exam/Pulses: feet warm with good cap refill; brisk DP and PT by doppler Extremities: without ischemic changes, without Gangrene , without cellulitis; without open wounds; stasis pigmentation changes both lower legs especially left medial ankle Musculoskeletal: no muscle wasting or atrophy  Neurologic: A&O X 3 Psychiatric:  The pt has Normal affect.   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 female here for evaluation of left lower extremity pain and edema  Ms. Riecke is a 73 year old female who was referred to the clinic for evaluation of left lower extremity pain and edema.  At the time of office visit she believes the edema in her left leg has improved.  She underwent left hip replacement in March of this year and has noticed edema improving as she rehabs from hip surgery.  She does not wear compression and is unable to put them on due to low back pain.  She elevates her legs during the day.  Left lower extremity venous reflux study was negative for DVT.  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  If edema or skin changes worsen in the future she can be reconsidered for left greater saphenous vein ablation.  She will continue leg elevation during the day.  She will continue to increase her mobility.  She will notify the office if symptoms worsen in the future.  For now she will follow-up on an as-needed basis.   Donnice Sender, PA-C [facility] [phone]  Clinic MD:   [place]

## 2023-07-25 NOTE — Telephone Encounter (Signed)
 Pt called in stating Eliquis  needs prior auth

## 2023-07-27 ENCOUNTER — Other Ambulatory Visit: Payer: Self-pay

## 2023-07-27 ENCOUNTER — Encounter: Payer: Self-pay | Admitting: Rehabilitative and Restorative Service Providers"

## 2023-07-27 ENCOUNTER — Ambulatory Visit: Admitting: Rehabilitative and Restorative Service Providers"

## 2023-07-27 ENCOUNTER — Telehealth: Payer: Self-pay | Admitting: Cardiovascular Disease

## 2023-07-27 DIAGNOSIS — M5459 Other low back pain: Secondary | ICD-10-CM | POA: Diagnosis not present

## 2023-07-27 DIAGNOSIS — M25552 Pain in left hip: Secondary | ICD-10-CM | POA: Diagnosis not present

## 2023-07-27 DIAGNOSIS — M25551 Pain in right hip: Secondary | ICD-10-CM | POA: Diagnosis not present

## 2023-07-27 DIAGNOSIS — R600 Localized edema: Secondary | ICD-10-CM

## 2023-07-27 DIAGNOSIS — M6281 Muscle weakness (generalized): Secondary | ICD-10-CM | POA: Diagnosis not present

## 2023-07-27 DIAGNOSIS — R262 Difficulty in walking, not elsewhere classified: Secondary | ICD-10-CM

## 2023-07-27 MED ORDER — APIXABAN 5 MG PO TABS: 5.0000 mg | ORAL_TABLET | Freq: Two times a day (BID) | ORAL | 1 refills | Status: DC

## 2023-07-27 NOTE — Telephone Encounter (Signed)
*  STAT* If patient is at the pharmacy, call can be transferred to refill team.   1. Which medications need to be refilled? (please list name of each medication and dose if known) apixaban (ELIQUIS) 5 MG TABS tablet  2. Which pharmacy/location (including street and city if local pharmacy) is medication to be sent to? [id number] - [place],  - [address]  3. Do they need a 30 day or 90 day supply? 90  

## 2023-07-27 NOTE — Telephone Encounter (Signed)
 Prescription refill request for Eliquis  received. Indication:afib Last office visit:upcoming Scr:0.69  2/25 Age: 73 Weight:73.2  kg  Prescription refilled
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
## 2023-07-28 ENCOUNTER — Ambulatory Visit: Attending: Cardiovascular Disease

## 2023-07-28 DIAGNOSIS — Z952 Presence of prosthetic heart valve: Secondary | ICD-10-CM

## 2023-07-28 LAB — ECHOCARDIOGRAM COMPLETE
AR max vel: 1.6 cm2
AV Area VTI: 1.68 cm2
AV Area mean vel: 1.56 cm2
AV Mean grad: 9.5 mmHg
AV Peak grad: 18 mmHg
Ao pk vel: 2.12 m/s
Area-P 1/2: 2.8 cm2
Calc EF: 55 %
S' Lateral: 3.4 cm
Single Plane A2C EF: 64.1 %
Single Plane A4C EF: 52 %

## 2023-07-31 ENCOUNTER — Ambulatory Visit: Payer: Self-pay | Admitting: Cardiovascular Disease

## 2023-08-01 ENCOUNTER — Encounter: Payer: Self-pay | Admitting: Cardiovascular Disease

## 2023-08-01 ENCOUNTER — Telehealth: Payer: Self-pay | Admitting: Rehabilitative and Restorative Service Providers"

## 2023-08-01 ENCOUNTER — Ambulatory Visit: Attending: Cardiovascular Disease | Admitting: Cardiovascular Disease

## 2023-08-01 VITALS — BP 140/80 | HR 70 | Ht 69.0 in | Wt 161.5 lb

## 2023-08-01 DIAGNOSIS — Z952 Presence of prosthetic heart valve: Secondary | ICD-10-CM | POA: Diagnosis not present

## 2023-08-01 DIAGNOSIS — I480 Paroxysmal atrial fibrillation: Secondary | ICD-10-CM

## 2023-08-01 DIAGNOSIS — I10 Essential (primary) hypertension: Secondary | ICD-10-CM | POA: Diagnosis not present

## 2023-08-01 DIAGNOSIS — I700 Atherosclerosis of aorta: Secondary | ICD-10-CM

## 2023-08-01 DIAGNOSIS — I350 Nonrheumatic aortic (valve) stenosis: Secondary | ICD-10-CM | POA: Diagnosis not present

## 2023-08-01 DIAGNOSIS — I2510 Atherosclerotic heart disease of native coronary artery without angina pectoris: Secondary | ICD-10-CM

## 2023-08-01 DIAGNOSIS — Z79899 Other long term (current) drug therapy: Secondary | ICD-10-CM

## 2023-08-01 NOTE — Patient Instructions (Signed)
 Medication Instructions:  No changes  If you need a refill on your cardiac medications before your next appointment, please call your pharmacy.   Lab work: CBC today  Testing/Procedures: No new testing needed  Follow-Up: At [facility], you and your health needs are our priority.  As part of our continuing mission to provide you with exceptional heart care, we have created designated Provider Care Teams.  These Care Teams include your primary Cardiologist (physician) and Advanced Practice Providers (APPs -  Physician Assistants and Nurse Practitioners) who all work together to provide you with the care you need, when you need it.  You will need a follow up appointment in 12 months  Providers on your designated Care Team:   Lonni Meager, NP Bernardino Bring, PA-C Cadence [place], [place]  COVID-19 Vaccine Information can be found at: [email or web address] For questions related to vaccine distribution or appointments, please email [email or web address] or call [phone].

## 2023-08-01 NOTE — Telephone Encounter (Signed)
 Offered appointment for today but patient indicated unable to make it   Ozell Silvan, PT, DPT, OCS, ATC 08/01/23  8:42 AM

## 2023-08-01 NOTE — Progress Notes (Signed)
 Cardiology Office Note  Date:  08/01/2023   ID:  JESSIA KIEF, DOB [birth year], MRN [id number]  PCP:  Gretel App, NP   Chief Complaint  Patient presents with   12 month follow up     Discuss Echo results. Doing well.      HPI:  Ms. Donna Rios is a 73 y.o. woman with past medical history of Hyperlipidemia Smoking,  Severe aortic valve stenosis, TAVR 2020 Venous insufficiency  Cath in 05/2018:no significant CAD Who presents to the office today for follow-up of her bicuspid AoV with severe AS s/p TAVR (07/11/18), PAF on eliquis   LOV May 2024 In follow-up today reports that she is slowly recovering from hip surgery April 04, 2023 on the left - Therapy has been slow, flared up her back pain Husband at times frustrated by her slow recovery  Chronic leg swelling left >right Unable to put compression hose on, husband unable to help  Echo 7/25 reviewed on today's visit Normal left and right ventricular size and function TAVR valve functioning well  Labs reviewed HGB in 3/25: was 8.7 Down from 12.8  EKG personally reviewed by myself on todays visit EKG Interpretation Date/Time:  Monday August 01 2023 13:33:31 EDT Ventricular Rate:  70 PR Interval:  144 QRS Duration:  100 QT Interval:  390 QTC Calculation: 421 R Axis:   3  Text Interpretation: Normal sinus rhythm Normal ECG When compared with ECG of 29-Oct-2019 09:51, No significant change was found Confirmed by Perla Lye [phone]) on 08/01/2023 2:09:05 PM   Other past medical history reviewed Runs of atrial fib 4/2/202, was taking prednisone  for back pain Rate 130 to 140 Tolerating her Eliquis  twice daily  Echo 06/2019: EF normal, stable TAVR  echo  07/11/18. TAVR with a 26 mm Edwards Sapien 3 THV via the TF approach on She had a brief run of Afib and was cardioverted back to sinus rhythm  zio did show atrial fib, paroxysmal  CHADS VASC probably 4   Treadmill at home, but does not use it   echocardiogram  04/10/2018.   ejection fraction is normal (60 - 65%). The right side was also normal. Her aortic valve appears to have two leaflets rather than three. It had a valve area of 0.79 cm.  Markedly elevated mean and peak systolic pressure   PMH:   has a past medical history of Allergic rhinitis, Anemia, Anxiety, Arthritis, COPD (chronic obstructive pulmonary disease) (HCC), Dysrhythmia, Fatty liver, Hypertension, Inguinal hernia, left, Phlebitis, S/P TAVR (transcatheter aortic valve replacement), Severe aortic stenosis, Stress incontinence, and Tobacco abuse.  Aortic valve stenosis Smoker  PSH:    Past Surgical History:  Procedure Laterality Date   ABDOMINAL HYSTERECTOMY     BREAST BIOPSY Left    ducts removed   Cataract surgery     COLONOSCOPY WITH PROPOFOL  N/A 09/29/2017   Procedure: COLONOSCOPY WITH PROPOFOL ;  Surgeon: Unk Corinn Skiff, MD;  Location: [facility] ENDOSCOPY;  Service: Gastroenterology;  Laterality: N/A;   EYE SURGERY     FOOT SURGERY Left    7   INTRAOPERATIVE TRANSTHORACIC ECHOCARDIOGRAM N/A 07/11/2018   Procedure: Intraoperative Transthoracic Echocardiogram;  Surgeon: Wonda Sharper, MD;  Location: [facility] OR;  Service: Open Heart Surgery;  Laterality: N/A;   KNEE ARTHROSCOPY Right    LAPAROSCOPIC HYSTERECTOMY     RIGHT HEART CATH AND CORONARY ANGIOGRAPHY N/A 06/15/2018   Procedure: RIGHT HEART CATH AND CORONARY ANGIOGRAPHY;  Surgeon: Wonda Sharper, MD;  Location: [facility] INVASIVE CV LAB;  Service: Cardiovascular;  Laterality: N/A;   TOOTH EXTRACTION  02/15/2023   TOTAL HIP ARTHROPLASTY Left 04/04/2023   Procedure: LEFT TOTAL HIP ARTHROPLASTY ANTERIOR APPROACH;  Surgeon: Addie Cordella Hamilton, MD;  Location: [facility] OR;  Service: Orthopedics;  Laterality: Left;   TRANSCATHETER AORTIC VALVE REPLACEMENT, TRANSFEMORAL  07/11/2018   TRANSCATHETER AORTIC VALVE REPLACEMENT, TRANSFEMORAL N/A 07/11/2018   Procedure: TRANSCATHETER AORTIC VALVE REPLACEMENT, TRANSFEMORAL;  Surgeon: Wonda Sharper,  MD;  Location: [facility] OR;  Service: Open Heart Surgery;  Laterality: N/A;   VARICOSE VEIN SURGERY Right     Current Outpatient Medications  Medication Sig Dispense Refill   acetaminophen  (TYLENOL  8 HOUR ARTHRITIS PAIN) 650 MG CR tablet Take 650 mg by mouth 2 (two) times daily.     apixaban  (ELIQUIS ) 5 MG TABS tablet Take 1 tablet (5 mg total) by mouth 2 (two) times daily. 180 tablet 1   Apple Cider Vinegar 500 MG TABS Take 500 mg by mouth daily as needed (with fatty foods).     Calcium -Magnesium -Vitamin D (CALCIUM  1200+D3 PO) Take 1 tablet by mouth daily.     cetirizine (ZYRTEC) 10 MG tablet Take 10 mg by mouth daily as needed for allergies.     docusate sodium  (COLACE) 100 MG capsule Take 1 capsule (100 mg total) by mouth 2 (two) times daily. 10 capsule 0   Elastic Bandages & Supports (KNEE BRACE/HINGED BARS MEDIUM) MISC Use daily while awake for knee support. Dx code M25.562, G89.29 1 each 0   fluticasone (FLONASE) 50 MCG/ACT nasal spray Place 1 spray into both nostrils daily as needed for allergies.      Lidocaine  4 % SOLN Apply 1 application  topically daily as needed (Knee pain/Back Pain).     losartan  (COZAAR ) 50 MG tablet Take 1 tablet (50 mg total) by mouth daily. 90 tablet 0   methocarbamol  (ROBAXIN ) 500 MG tablet Take 1 tablet (500 mg total) by mouth every 8 (eight) hours as needed for muscle spasms. 30 tablet 2   Multiple Vitamin (MULTIVITAMIN WITH MINERALS) TABS tablet Take 1 tablet by mouth every other day. Woman Centrum 50+     oxyCODONE  (OXY IR/ROXICODONE ) 5 MG immediate release tablet Take 1 tablet (5 mg total) by mouth every 4 (four) hours as needed for moderate pain (pain score 4-6). 30 tablet 0   polyethylene glycol (MIRALAX  / GLYCOLAX ) 17 g packet Take 17 g by mouth 2 (two) times daily.     Probiotic Product (PROBIOTIC ADVANCED PO) Take 1 tablet by mouth every other day.     Propylene Glycol (SYSTANE COMPLETE OP) Place 1 drop into both eyes 3 (three) times daily as needed  (dry/irritated eyes.).      rosuvastatin  (CRESTOR ) 40 MG tablet Take 1 tablet (40 mg total) by mouth daily. 90 tablet 0   amoxicillin -clavulanate (AUGMENTIN ) 875-125 MG tablet Take 1 tablet by mouth 2 (two) times daily. (Patient not taking: Reported on 08/01/2023) 14 tablet 0   doxycycline  (VIBRA -TABS) 100 MG tablet Take 1 tablet (100 mg total) by mouth 2 (two) times daily. (Patient not taking: Reported on 08/01/2023) 14 tablet 0   No current facility-administered medications for this visit.   Allergies:   Ibuprofen, Asa [aspirin ], Iohexol , and Prednisone    Social History:  The patient  reports that she has been smoking cigarettes. She started smoking about 54 years ago. She has a 27.2 pack-year smoking history. She has been exposed to tobacco smoke. She has never used smokeless tobacco. She reports current alcohol use. She reports that she does  not use drugs.   Family History:   family history includes Alcoholism in an other family member; Arthritis in an other family member; Diabetes in her mother and another family member; Heart disease in an other family member; Heart failure in her father and mother; Hypertension in her mother and another family member; Hypotension in her father; Lung cancer in an other family member; Stroke in an other family member.   Review of Systems: Review of Systems  Constitutional: Negative.   HENT: Negative.    Eyes: Negative.   Respiratory: Negative.    Cardiovascular:  Positive for leg swelling.  Gastrointestinal: Negative.   Genitourinary: Negative.   Musculoskeletal: Negative.   Neurological: Negative.   Psychiatric/Behavioral: Negative.    All other systems reviewed and are negative.  PHYSICAL EXAM: VS:  BP (!) 140/80 (BP Location: Left Arm, Patient Position: Sitting, Cuff Size: Normal)   Pulse 70   Ht 5' 9 (1.753 m)   Wt 161 lb 8 oz (73.3 kg)   SpO2 98%   BMI 23.85 kg/m  , BMI Body mass index is 23.85 kg/m. Constitutional:  oriented to person,  place, and time. No distress.  HENT:  Head: Grossly normal Eyes:  no discharge. No scleral icterus.  Neck: No JVD, no carotid bruits  Cardiovascular: Regular rate and rhythm, no murmurs appreciated Pulmonary/Chest: Clear to auscultation bilaterally, no wheezes or rails Abdominal: Soft.  no distension.  no tenderness.  Musculoskeletal: Normal range of motion Neurological:  normal muscle tone. Coordination normal. No atrophy Skin: Skin warm and dry Psychiatric: normal affect, pleasant  Recent Labs: 03/11/2023: ALT 16; BUN 9; Creatinine, Ser 0.69; Potassium 4.1; Sodium 138 04/05/2023: Hemoglobin 8.7; Platelets 127    Lipid Panel Lab Results  Component Value Date   CHOL 134 10/25/2022   HDL 66.20 10/25/2022   LDLCALC 52 10/25/2022   TRIG 80.0 10/25/2022      Wt Readings from Last 3 Encounters:  08/01/23 161 lb 8 oz (73.3 kg)  07/25/23 161 lb 6.4 oz (73.2 kg)  07/21/23 160 lb (72.6 kg)    ASSESSMENT AND PLAN:  Severe aortic valve stenosis S/p TAVR Echo 04/2021, stable valve Follow-up echo July 2025 stable valve, low gradient  Aortic atherosclerosis mild disease noted on evaluation of her CT,  On Crestor  40 daily  Paroxysmal atrial fibrillation Seen on event monitor,  eliquis  5 BID,  Maintaining normal sinus rhythm Denies any symptoms For recurrent symptoms may need rate controlling agents  Tobacco abuse We have encouraged her to continue to work on weaning her cigarettes and smoking cessation. She will continue to work on this and does not want any assistance with chantix.    Prediabetes A1C 6.0 weight stable Activity limited secondary to chronic hip and back pain, calorie restriction recommended  Mixed hyperlipidemia Continue Crestor  40 daily  Essential HTN Blood pressure is well controlled on today's visit. No changes made to the medications.  Anxiety: Not on medication    Orders Placed This Encounter  Procedures   EKG 12-Lead     Signed, Velinda Lunger, M.D., Ph.D. 08/01/2023  [facility] [place], [place] [phone]

## 2023-08-02 LAB — CBC
Hematocrit: 37.7 % (ref 34.0–46.6)
Hemoglobin: 12.3 g/dL (ref 11.1–15.9)
MCH: 30.7 pg (ref 26.6–33.0)
MCHC: 32.6 g/dL (ref 31.5–35.7)
MCV: 94 fL (ref 79–97)
Platelets: 196 x10E3/uL (ref 150–450)
RBC: 4.01 x10E6/uL (ref 3.77–5.28)
RDW: 14.4 % (ref 11.7–15.4)
WBC: 7.8 x10E3/uL (ref 3.4–10.8)

## 2023-08-03 ENCOUNTER — Ambulatory Visit: Admitting: Rehabilitative and Restorative Service Providers"

## 2023-08-03 DIAGNOSIS — M25551 Pain in right hip: Secondary | ICD-10-CM

## 2023-08-03 DIAGNOSIS — R600 Localized edema: Secondary | ICD-10-CM

## 2023-08-03 DIAGNOSIS — R262 Difficulty in walking, not elsewhere classified: Secondary | ICD-10-CM

## 2023-08-03 DIAGNOSIS — M25552 Pain in left hip: Secondary | ICD-10-CM

## 2023-08-03 DIAGNOSIS — M5459 Other low back pain: Secondary | ICD-10-CM

## 2023-08-03 DIAGNOSIS — M6281 Muscle weakness (generalized): Secondary | ICD-10-CM | POA: Diagnosis not present
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
## 2023-08-04 DIAGNOSIS — L089 Local infection of the skin and subcutaneous tissue, unspecified: Secondary | ICD-10-CM | POA: Insufficient documentation

## 2023-08-04 NOTE — Assessment & Plan Note (Signed)
 Sustained a cat scratch on the right hand. Initial bleeding improved. Slight swelling and redness likely due to healing and adhesive reaction. No pain or active bleeding. Wound sealed. Augmentin  prescribed to prevent infection. - Discontinue peroxide; clean with soap and water . - Prescribe Augmentin  for 7 days, take with food and probiotics. - Keep wound open to air unless outside, then cover.

## 2023-08-05 ENCOUNTER — Ambulatory Visit: Admitting: Physical Therapy

## 2023-08-05 ENCOUNTER — Encounter: Payer: Self-pay | Admitting: Physical Therapy

## 2023-08-05 DIAGNOSIS — R600 Localized edema: Secondary | ICD-10-CM

## 2023-08-05 DIAGNOSIS — M6281 Muscle weakness (generalized): Secondary | ICD-10-CM

## 2023-08-05 DIAGNOSIS — M25551 Pain in right hip: Secondary | ICD-10-CM | POA: Diagnosis not present

## 2023-08-05 DIAGNOSIS — M25552 Pain in left hip: Secondary | ICD-10-CM

## 2023-08-05 DIAGNOSIS — M5459 Other low back pain: Secondary | ICD-10-CM

## 2023-08-05 DIAGNOSIS — R262 Difficulty in walking, not elsewhere classified: Secondary | ICD-10-CM
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
## 2023-08-07 ENCOUNTER — Ambulatory Visit: Payer: Self-pay | Admitting: Cardiovascular Disease

## 2023-08-15 ENCOUNTER — Ambulatory Visit: Admitting: Surgical

## 2023-08-15 DIAGNOSIS — M5416 Radiculopathy, lumbar region: Secondary | ICD-10-CM

## 2023-08-15 DIAGNOSIS — M48062 Spinal stenosis, lumbar region with neurogenic claudication: Secondary | ICD-10-CM | POA: Diagnosis not present

## 2023-08-16 ENCOUNTER — Encounter: Payer: Self-pay | Admitting: Physical Therapy

## 2023-08-16 ENCOUNTER — Encounter: Payer: Self-pay | Admitting: Surgical

## 2023-08-16 ENCOUNTER — Ambulatory Visit: Admitting: Physical Therapy

## 2023-08-16 DIAGNOSIS — R262 Difficulty in walking, not elsewhere classified: Secondary | ICD-10-CM

## 2023-08-16 DIAGNOSIS — M25551 Pain in right hip: Secondary | ICD-10-CM | POA: Diagnosis not present

## 2023-08-16 DIAGNOSIS — R600 Localized edema: Secondary | ICD-10-CM

## 2023-08-16 DIAGNOSIS — M5459 Other low back pain: Secondary | ICD-10-CM

## 2023-08-16 DIAGNOSIS — M25552 Pain in left hip: Secondary | ICD-10-CM

## 2023-08-16 DIAGNOSIS — M6281 Muscle weakness (generalized): Secondary | ICD-10-CM
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
## 2023-08-18 ENCOUNTER — Encounter: Payer: Self-pay | Admitting: Rehabilitative and Restorative Service Providers"

## 2023-08-18 ENCOUNTER — Ambulatory Visit: Admitting: Rehabilitative and Restorative Service Providers"

## 2023-08-18 DIAGNOSIS — M6281 Muscle weakness (generalized): Secondary | ICD-10-CM | POA: Diagnosis not present

## 2023-08-18 DIAGNOSIS — M25551 Pain in right hip: Secondary | ICD-10-CM | POA: Diagnosis not present

## 2023-08-18 DIAGNOSIS — R262 Difficulty in walking, not elsewhere classified: Secondary | ICD-10-CM | POA: Diagnosis not present

## 2023-08-18 DIAGNOSIS — M25552 Pain in left hip: Secondary | ICD-10-CM | POA: Diagnosis not present

## 2023-08-18 DIAGNOSIS — M5459 Other low back pain: Secondary | ICD-10-CM

## 2023-08-18 DIAGNOSIS — R600 Localized edema: Secondary | ICD-10-CM
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
## 2023-08-19 ENCOUNTER — Other Ambulatory Visit: Payer: Self-pay | Admitting: Physical Medicine and Rehabilitation

## 2023-08-19 MED ORDER — DIAZEPAM 5 MG PO TABS: ORAL_TABLET | ORAL | 0 refills | Status: DC

## 2023-08-24 ENCOUNTER — Encounter: Payer: Self-pay | Admitting: Rehabilitative and Restorative Service Providers"

## 2023-08-24 ENCOUNTER — Ambulatory Visit: Admitting: Rehabilitative and Restorative Service Providers"

## 2023-08-24 DIAGNOSIS — M6281 Muscle weakness (generalized): Secondary | ICD-10-CM | POA: Diagnosis not present

## 2023-08-24 DIAGNOSIS — M25552 Pain in left hip: Secondary | ICD-10-CM

## 2023-08-24 DIAGNOSIS — R600 Localized edema: Secondary | ICD-10-CM

## 2023-08-24 DIAGNOSIS — R262 Difficulty in walking, not elsewhere classified: Secondary | ICD-10-CM

## 2023-08-24 DIAGNOSIS — M25551 Pain in right hip: Secondary | ICD-10-CM | POA: Diagnosis not present
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
## 2023-08-26 ENCOUNTER — Ambulatory Visit: Admitting: Rehabilitative and Restorative Service Providers"

## 2023-08-26 ENCOUNTER — Encounter: Payer: Self-pay | Admitting: Rehabilitative and Restorative Service Providers"

## 2023-08-26 DIAGNOSIS — R262 Difficulty in walking, not elsewhere classified: Secondary | ICD-10-CM

## 2023-08-26 DIAGNOSIS — M5459 Other low back pain: Secondary | ICD-10-CM | POA: Diagnosis not present

## 2023-08-26 DIAGNOSIS — M6281 Muscle weakness (generalized): Secondary | ICD-10-CM | POA: Diagnosis not present

## 2023-08-26 DIAGNOSIS — R600 Localized edema: Secondary | ICD-10-CM

## 2023-08-26 DIAGNOSIS — M25552 Pain in left hip: Secondary | ICD-10-CM
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
## 2023-09-02 ENCOUNTER — Encounter: Admitting: Nurse Practitioner

## 2023-09-12 ENCOUNTER — Ambulatory Visit: Admitting: Nurse Practitioner

## 2023-09-12 VITALS — BP 146/80 | HR 69 | Temp 98.1°F | Ht 69.0 in | Wt 162.8 lb

## 2023-09-12 DIAGNOSIS — Z1329 Encounter for screening for other suspected endocrine disorder: Secondary | ICD-10-CM | POA: Diagnosis not present

## 2023-09-12 DIAGNOSIS — I10 Essential (primary) hypertension: Secondary | ICD-10-CM

## 2023-09-12 DIAGNOSIS — G8929 Other chronic pain: Secondary | ICD-10-CM

## 2023-09-12 DIAGNOSIS — I480 Paroxysmal atrial fibrillation: Secondary | ICD-10-CM

## 2023-09-12 DIAGNOSIS — M5441 Lumbago with sciatica, right side: Secondary | ICD-10-CM | POA: Diagnosis not present

## 2023-09-12 DIAGNOSIS — Z96642 Presence of left artificial hip joint: Secondary | ICD-10-CM | POA: Diagnosis not present

## 2023-09-12 DIAGNOSIS — R7303 Prediabetes: Secondary | ICD-10-CM | POA: Diagnosis not present

## 2023-09-12 DIAGNOSIS — E782 Mixed hyperlipidemia: Secondary | ICD-10-CM | POA: Diagnosis not present

## 2023-09-12 DIAGNOSIS — M8580 Other specified disorders of bone density and structure, unspecified site: Secondary | ICD-10-CM | POA: Diagnosis not present

## 2023-09-12 DIAGNOSIS — F17200 Nicotine dependence, unspecified, uncomplicated: Secondary | ICD-10-CM

## 2023-09-12 MED ORDER — LOSARTAN POTASSIUM 50 MG PO TABS: 50.0000 mg | ORAL_TABLET | Freq: Every day | ORAL | 3 refills | Status: AC

## 2023-09-12 MED ORDER — ROSUVASTATIN CALCIUM 40 MG PO TABS: 40.0000 mg | ORAL_TABLET | Freq: Every day | ORAL | 3 refills | Status: AC

## 2023-09-12 NOTE — Progress Notes (Signed)
 Leron Glance, NP-C Phone: [phone]  Donna Rios is a 73 y.o. female who presents today for transfer of care.   Discussed the use of AI scribe software for clinical note transcription with the patient, who gave verbal consent to proceed.  History of Present Illness   Donna Rios is a 73 year old female who presents for transfer of care.  She monitors her blood pressure at home, with readings generally in the 120s to 130s over 70s to 80s. She is currently on 50 mg of losartan  daily, having previously reduced the dose to 25 mg because it was making her blood pressure drop too much. No chest pain, shortness of breath, or dizziness.  She underwent hip replacement surgery in March and is focused on increasing her mobility. She is attempting to walk without a cane and has been walking to her mailbox and back. She uses a recumbent bike for exercise, aiming to tone her legs. She has not attended physical therapy in over a week due to insurance issues and is scheduled for a back injection soon.  She takes Crestor  for cholesterol management and reports no abdominal pain but experiences irritation if she does not eat with her medications.   She is on Eliquis  and has a history of bovine aortic valve replacement. She mentions a past episode of atrial fibrillation triggered by high-dose prednisone , which resolved with rest. She has experienced falls in the past, typically landing on her buttocks, and is cautious due to being on Eliquis . She uses a cane for stability, especially when walking on uneven surfaces.  She smokes but has reduced her consumption compared to the past. She plans to cut back further once her mobility improves.  She experiences incontinence and constipation, which she believes will improve with increased walking and fluid intake. She is trying to drink more water  but finds it challenging at times.      Social History   Tobacco Use  Smoking Status Every Day    Current packs/day: 0.25   Average packs/day: 0.5 packs/day for 54.7 years (27.2 ttl pk-yrs)   Types: Cigarettes   Start date: 1971   Passive exposure: Past  Smokeless Tobacco Never    Current Outpatient Medications on File Prior to Visit  Medication Sig Dispense Refill   acetaminophen  (TYLENOL  8 HOUR ARTHRITIS PAIN) 650 MG CR tablet Take 650 mg by mouth 2 (two) times daily.     apixaban  (ELIQUIS ) 5 MG TABS tablet Take 1 tablet (5 mg total) by mouth 2 (two) times daily. 180 tablet 1   Apple Cider Vinegar 500 MG TABS Take 500 mg by mouth daily as needed (with fatty foods).     Calcium -Magnesium -Vitamin D  (CALCIUM  1200+D3 PO) Take 1 tablet by mouth daily.     cetirizine (ZYRTEC) 10 MG tablet Take 10 mg by mouth daily as needed for allergies.     docusate sodium  (COLACE) 100 MG capsule Take 1 capsule (100 mg total) by mouth 2 (two) times daily. 10 capsule 0   Elastic Bandages & Supports (KNEE BRACE/HINGED BARS MEDIUM) MISC Use daily while awake for knee support. Dx code M25.562, G89.29 1 each 0   fluticasone (FLONASE) 50 MCG/ACT nasal spray Place 1 spray into both nostrils daily as needed for allergies.      Lidocaine  4 % SOLN Apply 1 application  topically daily as needed (Knee pain/Back Pain).     methocarbamol  (ROBAXIN ) 500 MG tablet Take 1 tablet (500 mg total) by mouth every 8 (  eight) hours as needed for muscle spasms. 30 tablet 2   Multiple Vitamin (MULTIVITAMIN WITH MINERALS) TABS tablet Take 1 tablet by mouth every other day. Woman Centrum 50+     polyethylene glycol (MIRALAX  / GLYCOLAX ) 17 g packet Take 17 g by mouth 2 (two) times daily.     Probiotic Product (PROBIOTIC ADVANCED PO) Take 1 tablet by mouth every other day.     Propylene Glycol (SYSTANE COMPLETE OP) Place 1 drop into both eyes 3 (three) times daily as needed (dry/irritated eyes.).      No current facility-administered medications on file prior to visit.     ROS see history of present illness  Objective  Physical  Exam Vitals:   09/12/23 1311 09/12/23 1336  BP: (!) 143/83 (!) 146/80  Pulse: 69   Temp: 98.1 F (36.7 C)   SpO2: 99%     BP Readings from Last 3 Encounters:  09/12/23 (!) 146/80  08/01/23 (!) 140/80  07/25/23 (!) 143/85   Wt Readings from Last 3 Encounters:  09/12/23 162 lb 12.8 oz (73.8 kg)  08/01/23 161 lb 8 oz (73.3 kg)  07/25/23 161 lb 6.4 oz (73.2 kg)    Physical Exam Constitutional:      General: She is not in acute distress.    Appearance: Normal appearance.  HENT:     Head: Normocephalic.  Cardiovascular:     Rate and Rhythm: Normal rate and regular rhythm.     Heart sounds: Normal heart sounds.  Pulmonary:     Effort: Pulmonary effort is normal.     Breath sounds: Normal breath sounds.  Skin:    General: Skin is warm and dry.  Neurological:     General: No focal deficit present.     Mental Status: She is alert.  Psychiatric:        Mood and Affect: Mood normal.        Behavior: Behavior normal.      Assessment/Plan: Please see individual problem list.  Primary hypertension Assessment & Plan: Blood pressure readings at home are satisfactory, though the home cuff may read higher than the clinic's. Elevated today in office. Recheck blood pressure before leaving the clinic. Continue losartan  50 mg daily. Check CMP. We will continue to monitor.   Orders: -     Losartan  Potassium; Take 1 tablet (50 mg total) by mouth daily.  Dispense: 90 tablet; Refill: 3 -     Comprehensive metabolic panel with GFR  Paroxysmal atrial fibrillation (HCC) Assessment & Plan: She is on Eliquis  for anticoagulation with no current atrial fibrillation. Continue Eliquis  and follow up with cardiology.   S/P hip replacement, left Assessment & Plan: Recovering post-surgery with nerve-related sensations. Continue physical therapy as tolerated and attend the orthopedic appointment for a back injection.   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 Attend the scheduled back injection appointment and continue modified physical therapy exercises.   Smoker Assessment & Plan: Plans to reduce smoking as mobility improves. Encourage reduction in smoking and discuss potential smoking cessation strategies.    Mixed hyperlipidemia Assessment & Plan: Managed with Crestor  40 mg daily. Continue Crestor . Check lipid panel.   Orders: -     Rosuvastatin  Calcium ; Take 1 tablet (40 mg total) by mouth daily.  Dispense: 90 tablet; Refill: 3 -     Lipid panel  Prediabetes Assessment & Plan: Check A1c. Encourage dietary modifications.   Orders: -  Hemoglobin A1c  Osteopenia, unspecified location -     VITAMIN D  25 Hydroxy (Vit-D Deficiency, Fractures)  Thyroid  disorder screen -     TSH     Return in about 3 months (around 12/13/2023) for Follow up.   Leron Glance, NP-C [facility]

## 2023-09-13 ENCOUNTER — Ambulatory Visit: Admitting: Physical Medicine and Rehabilitation

## 2023-09-13 ENCOUNTER — Other Ambulatory Visit: Payer: Self-pay

## 2023-09-13 DIAGNOSIS — M5416 Radiculopathy, lumbar region: Secondary | ICD-10-CM

## 2023-09-13 LAB — VITAMIN D 25 HYDROXY (VIT D DEFICIENCY, FRACTURES): VITD: 31.78 ng/mL (ref 30.00–100.00)

## 2023-09-13 LAB — LIPID PANEL
Cholesterol: 146 mg/dL (ref 0–200)
HDL: 64.4 mg/dL (ref >39.00)
LDL Cholesterol: 52 mg/dL (ref 0–99)
NonHDL: 81.33
Total CHOL/HDL Ratio: 2
Triglycerides: 147 mg/dL (ref 0.0–149.0)
VLDL: 29.4 mg/dL (ref 0.0–40.0)

## 2023-09-13 LAB — COMPREHENSIVE METABOLIC PANEL WITH GFR
ALT: 16 U/L (ref 0–35)
AST: 21 U/L (ref 0–37)
Albumin: 4.3 g/dL (ref 3.5–5.2)
Alkaline Phosphatase: 91 U/L (ref 39–117)
BUN: 12 mg/dL (ref 6–23)
CO2: 29 meq/L (ref 19–32)
Calcium: 9.3 mg/dL (ref 8.4–10.5)
Chloride: 103 meq/L (ref 96–112)
Creatinine, Ser: 0.63 mg/dL (ref 0.40–1.20)
GFR: 88.23 mL/min (ref >60.00)
Glucose, Bld: 92 mg/dL (ref 70–99)
Potassium: 4.1 meq/L (ref 3.5–5.1)
Sodium: 139 meq/L (ref 135–145)
Total Bilirubin: 0.5 mg/dL (ref 0.2–1.2)
Total Protein: 6.3 g/dL (ref 6.0–8.3)

## 2023-09-13 LAB — TSH: TSH: 0.57 u[IU]/mL (ref 0.35–5.50)

## 2023-09-13 LAB — HEMOGLOBIN A1C: Hgb A1c MFr Bld: 6.2 % (ref 4.6–6.5)

## 2023-09-13 MED ORDER — METHYLPREDNISOLONE ACETATE 40 MG/ML IJ SUSP
40.0000 mg | Freq: Once | INTRAMUSCULAR | Status: AC
  Administered 2023-09-13: 40 mg

## 2023-09-13 NOTE — Progress Notes (Signed)
 Pain Scale   Average Pain 5    166/81 145/78 74 bpm   +Driver, +BT, +Dye Allergies.  On eliquis  Allergic to Iohexol 

## 2023-09-13 NOTE — Procedures (Signed)
 Lumbosacral Transforaminal Epidural Steroid Injection - Sub-Pedicular Approach with Fluoroscopic Guidance  Patient: Donna Rios      Date of Birth: [birth year] MRN: [id number] PCP: Gretel App, NP      Visit Date: 09/13/2023   Universal Protocol:    Date/Time: 09/13/2023  Consent Given By: the patient  Position: PRONE  Additional Comments: Vital signs were monitored before and after the procedure. Patient was prepped and draped in the usual sterile fashion. The correct patient, procedure, and site was verified.   Injection Procedure Details:   Procedure diagnoses: Lumbar radiculopathy [M54.16]    Meds Administered:  Meds ordered this encounter  Medications   methylPREDNISolone  acetate (DEPO-MEDROL ) injection 40 mg    Laterality: Left  Location/Site: L4  Needle:5.0 in., 22 ga.  Short bevel or Quincke spinal needle  Needle Placement: Transforaminal  Findings:    -Comments: Excellent flow of contrast along the nerve, nerve root and into the epidural space.  Procedure Details: After squaring off the end-plates to get a true AP view, the C-arm was positioned so that an oblique view of the foramen as noted above was visualized. The target area is just inferior to the nose of the scotty dog or sub pedicular. The soft tissues overlying this structure were infiltrated with 2-3 ml. of 1% Lidocaine  without Epinephrine .  The spinal needle was inserted toward the target using a trajectory view along the fluoroscope beam.  Under AP and lateral visualization, the needle was advanced so it did not puncture dura and was located close the 6 O'Clock position of the pedical in AP tracterory. Biplanar projections were used to confirm position. Aspiration was confirmed to be negative for CSF and/or blood. A 1-2 ml. volume of Isovue -250 was injected and flow of contrast was noted at each level. Radiographs were obtained for documentation purposes.   After attaining the desired flow of  contrast documented above, a 0.5 to 1.0 ml test dose of 0.25% Marcaine  was injected into each respective transforaminal space.  The patient was observed for 90 seconds post injection.  After no sensory deficits were reported, and normal lower extremity motor function was noted,   the above injectate was administered so that equal amounts of the injectate were placed at each foramen (level) into the transforaminal epidural space.   Additional Comments:  The patient tolerated the procedure well Dressing: 2 x 2 sterile gauze and Band-Aid    Post-procedure details: Patient was observed during the procedure. Post-procedure instructions were reviewed.  Patient left the clinic in stable condition.

## 2023-09-13 NOTE — Progress Notes (Signed)
 Donna Rios - 73 y.o. female MRN [id number]  Date of birth: December 05, 1950  Office Visit Note: Visit Date: 09/13/2023 PCP: Gretel App, NP Referred by: Gretel App, NP  Subjective: No chief complaint on file.  HPI:  Donna Rios is a 73 y.o. female who comes in today at the request of Herlene Calix, PA-C for planned Left L4-5 Lumbar Transforaminal epidural steroid injection with fluoroscopic guidance.  The patient has failed conservative care including home exercise, medications, time and activity modification.  This injection will be diagnostic and hopefully therapeutic.  Please see requesting physician notes for further details and justification.  She reports several falls over the last several months.  She did appear on fluoroscopy today to have a very small left superior endplate fracture of probably 10% at L4.  Reviewing fluoroscopic imaging the last injection in May it did show that as well.  In January of this year though on that fluoroscopic images it was not present so the fracture did occur sometime between January and May of this year.   ROS Otherwise per HPI.  Assessment & Plan: Visit Diagnoses:    ICD-10-CM   1. Lumbar radiculopathy  M54.16 XR C-ARM NO REPORT    Epidural Steroid injection    methylPREDNISolone  acetate (DEPO-MEDROL ) injection 40 mg      Plan: No additional findings.   Meds & Orders:  Meds ordered this encounter  Medications   methylPREDNISolone  acetate (DEPO-MEDROL ) injection 40 mg    Orders Placed This Encounter  Procedures   XR C-ARM NO REPORT   Epidural Steroid injection    Follow-up: Return for visit to requesting provider as needed.   Procedures: No procedures performed  Lumbosacral Transforaminal Epidural Steroid Injection - Sub-Pedicular Approach with Fluoroscopic Guidance  Patient: Donna Rios      Date of Birth: 1950-02-06 MRN: [id number] PCP: Gretel App, NP      Visit Date: 09/13/2023   Universal Protocol:    Date/Time:  09/13/2023  Consent Given By: the patient  Position: PRONE  Additional Comments: Vital signs were monitored before and after the procedure. Patient was prepped and draped in the usual sterile fashion. The correct patient, procedure, and site was verified.   Injection Procedure Details:   Procedure diagnoses: Lumbar radiculopathy [M54.16]    Meds Administered:  Meds ordered this encounter  Medications   methylPREDNISolone  acetate (DEPO-MEDROL ) injection 40 mg    Laterality: Left  Location/Site: L4  Needle:5.0 in., 22 ga.  Short bevel or Quincke spinal needle  Needle Placement: Transforaminal  Findings:    -Comments: Excellent flow of contrast along the nerve, nerve root and into the epidural space.  Procedure Details: After squaring off the end-plates to get a true AP view, the C-arm was positioned so that an oblique view of the foramen as noted above was visualized. The target area is just inferior to the nose of the scotty dog or sub pedicular. The soft tissues overlying this structure were infiltrated with 2-3 ml. of 1% Lidocaine  without Epinephrine .  The spinal needle was inserted toward the target using a trajectory view along the fluoroscope beam.  Under AP and lateral visualization, the needle was advanced so it did not puncture dura and was located close the 6 O'Clock position of the pedical in AP tracterory. Biplanar projections were used to confirm position. Aspiration was confirmed to be negative for CSF and/or blood. A 1-2 ml. volume of Isovue -250 was injected and flow of contrast was noted at each level. Radiographs  were obtained for documentation purposes.   After attaining the desired flow of contrast documented above, a 0.5 to 1.0 ml test dose of 0.25% Marcaine  was injected into each respective transforaminal space.  The patient was observed for 90 seconds post injection.  After no sensory deficits were reported, and normal lower extremity motor function was  noted,   the above injectate was administered so that equal amounts of the injectate were placed at each foramen (level) into the transforaminal epidural space.   Additional Comments:  The patient tolerated the procedure well Dressing: 2 x 2 sterile gauze and Band-Aid    Post-procedure details: Patient was observed during the procedure. Post-procedure instructions were reviewed.  Patient left the clinic in stable condition.    Clinical History: MRI LUMBAR SPINE WITHOUT CONTRAST   TECHNIQUE: Multiplanar, multisequence MR imaging of the lumbar spine was performed. No intravenous contrast was administered.   COMPARISON:  08/02/2019 lumbar spine radiographs   FINDINGS: Segmentation:  Standard.   Alignment: Mild lumbar levoscoliosis. 3 mm anterolisthesis of L4 on L5.   Vertebrae: No fracture or suspicious osseous lesion. Mild degenerative endplate edema at U87-O8.   Conus medullaris and cauda equina: Conus extends to the L1-2 level. Conus and cauda equina appear normal.   Paraspinal and other soft tissues: Unremarkable.   Disc levels:   Disc desiccation throughout the lumbar and lower thoracic spine. Mild disc space narrowing at T12-L1, L4-5, and L5-S1.   T11-12: Only imaged sagittally. Mild disc bulging and moderate facet arthrosis without evidence of significant stenosis.   T12-L1: Disc bulging mildly eccentric to the left and mild right and moderate left facet arthrosis without stenosis.   L1-2: Minimal disc bulging and mild facet and ligamentum flavum hypertrophy without stenosis.   L2-3: Mild disc bulging and mild facet and ligamentum flavum hypertrophy without stenosis.   L3-4: Mild disc bulging and moderate facet and ligamentum flavum hypertrophy without stenosis.   L4-5: Anterolisthesis with slight bulging of uncovered disc and severe facet and ligamentum flavum hypertrophy result in moderate to severe spinal stenosis without neural foraminal stenosis.    L5-S1: Disc bulging, endplate spurring, disc space height loss, and moderate left greater than right facet hypertrophy result in mild bilateral neural foraminal stenosis without spinal stenosis.   IMPRESSION: 1. Severe L4-5 facet arthrosis with grade 1 anterolisthesis and moderate to severe spinal stenosis. 2. Mild bilateral neural foraminal stenosis at L5-S1.     Electronically Signed   By: Dasie Hamburg M.D.   On: 05/01/2020 10:25     Objective:  VS:  HT:    WT:   BMI:     BP:   HR: bpm  TEMP: ( )  RESP:  Physical Exam Vitals and nursing note reviewed.  Constitutional:      General: She is not in acute distress.    Appearance: Normal appearance. She is not ill-appearing.  HENT:     Head: Normocephalic and atraumatic.     Right Ear: External ear normal.     Left Ear: External ear normal.  Eyes:     Extraocular Movements: Extraocular movements intact.  Cardiovascular:     Rate and Rhythm: Normal rate.     Pulses: Normal pulses.  Pulmonary:     Effort: Pulmonary effort is normal. No respiratory distress.  Abdominal:     General: There is no distension.     Palpations: Abdomen is soft.  Musculoskeletal:        General: Tenderness present.  Cervical back: Neck supple.     Right lower leg: No edema.     Left lower leg: No edema.     Comments: Patient has good distal strength with no pain over the greater trochanters.  No clonus or focal weakness.  Skin:    Findings: No erythema, lesion or rash.  Neurological:     General: No focal deficit present.     Mental Status: She is alert and oriented to person, place, and time.     Cranial Nerves: No cranial nerve deficit.     Sensory: No sensory deficit.     Motor: No weakness or abnormal muscle tone.     Coordination: Coordination normal.     Gait: Gait abnormal.  Psychiatric:        Mood and Affect: Mood normal.        Behavior: Behavior normal.      Imaging: No results found.

## 2023-09-13 NOTE — Patient Instructions (Addendum)

## 2023-09-14 ENCOUNTER — Ambulatory Visit: Payer: Self-pay | Admitting: Nurse Practitioner

## 2023-09-22 ENCOUNTER — Encounter: Payer: Self-pay | Admitting: Nurse Practitioner

## 2023-09-22 NOTE — Assessment & Plan Note (Signed)
 Check A1c. Encourage dietary modifications.

## 2023-09-22 NOTE — Assessment & Plan Note (Signed)
 Blood pressure readings at home are satisfactory, though the home cuff may read higher than the clinic's. Elevated today in office. Recheck blood pressure before leaving the clinic. Continue losartan  50 mg daily. Check CMP. We will continue to monitor.

## 2023-09-22 NOTE — Assessment & Plan Note (Signed)
 Recovering post-surgery with nerve-related sensations. Continue physical therapy as tolerated and attend the orthopedic appointment for a back injection.

## 2023-09-22 NOTE — Assessment & Plan Note (Signed)
 Plans to reduce smoking as mobility improves. Encourage reduction in smoking and discuss potential smoking cessation strategies.

## 2023-09-22 NOTE — Assessment & Plan Note (Signed)
 Managed with physical therapy, with an injection scheduled. Attend the scheduled back injection appointment and continue modified physical therapy exercises.

## 2023-09-22 NOTE — Assessment & Plan Note (Addendum)
 She is on Eliquis  for anticoagulation with no current atrial fibrillation. Continue Eliquis  and follow up with cardiology.

## 2023-09-22 NOTE — Assessment & Plan Note (Signed)
 Managed with Crestor  40 mg daily. Continue Crestor . Check lipid panel.

## 2023-10-11 ENCOUNTER — Ambulatory Visit

## 2023-11-28 ENCOUNTER — Encounter: Payer: Self-pay | Admitting: Radiology

## 2023-12-08 ENCOUNTER — Ambulatory Visit
Admission: RE | Admit: 2023-12-08 | Discharge: 2023-12-08 | Disposition: A | Discharge status: Home / Self Care | Source: Ambulatory Visit | Attending: Acute Care | Admitting: Acute Care

## 2023-12-08 DIAGNOSIS — Z87891 Personal history of nicotine dependence: Secondary | ICD-10-CM | POA: Insufficient documentation

## 2023-12-08 DIAGNOSIS — F17210 Nicotine dependence, cigarettes, uncomplicated: Secondary | ICD-10-CM | POA: Insufficient documentation

## 2023-12-08 DIAGNOSIS — Z122 Encounter for screening for malignant neoplasm of respiratory organs: Secondary | ICD-10-CM | POA: Diagnosis not present

## 2023-12-13 ENCOUNTER — Ambulatory Visit: Admitting: Nurse Practitioner

## 2023-12-14 ENCOUNTER — Other Ambulatory Visit: Payer: Self-pay | Admitting: Physical Medicine and Rehabilitation

## 2023-12-14 ENCOUNTER — Telehealth: Payer: Self-pay | Admitting: Physical Medicine and Rehabilitation

## 2023-12-14 DIAGNOSIS — M48062 Spinal stenosis, lumbar region with neurogenic claudication: Secondary | ICD-10-CM

## 2023-12-14 DIAGNOSIS — M5416 Radiculopathy, lumbar region: Secondary | ICD-10-CM

## 2023-12-14 MED ORDER — DIAZEPAM 5 MG PO TABS: ORAL_TABLET | ORAL | 0 refills | Status: AC

## 2023-12-14 NOTE — Telephone Encounter (Signed)
 Pt called wanting to get an appt for an injection in her back again. Call back number is [phone].

## 2023-12-16 ENCOUNTER — Other Ambulatory Visit: Payer: Self-pay

## 2023-12-16 DIAGNOSIS — Z87891 Personal history of nicotine dependence: Secondary | ICD-10-CM

## 2023-12-16 DIAGNOSIS — Z122 Encounter for screening for malignant neoplasm of respiratory organs: Secondary | ICD-10-CM

## 2023-12-16 DIAGNOSIS — F17210 Nicotine dependence, cigarettes, uncomplicated: Secondary | ICD-10-CM

## 2024-01-11 ENCOUNTER — Ambulatory Visit: Admitting: Physical Medicine and Rehabilitation

## 2024-01-11 ENCOUNTER — Other Ambulatory Visit: Payer: Self-pay

## 2024-01-11 VITALS — BP 131/84 | HR 71

## 2024-01-11 DIAGNOSIS — M5416 Radiculopathy, lumbar region: Secondary | ICD-10-CM | POA: Diagnosis not present

## 2024-01-11 MED ORDER — METHYLPREDNISOLONE ACETATE 40 MG/ML IJ SUSP
40.0000 mg | Freq: Once | INTRAMUSCULAR | Status: AC
  Administered 2024-01-11: 40 mg

## 2024-01-11 NOTE — Progress Notes (Unsigned)
 Pain Scale   Average Pain 7 Patient advising she has chronic lower back pain that increases when sitting and decreases when walking        +Driver, -BT, -Dye Allergies.

## 2024-01-16 NOTE — Progress Notes (Signed)
 "  Donna Rios - 73 y.o. female MRN [id number]  Date of birth: December 05, 1950  Office Visit Note: Visit Date: 01/11/2024 PCP: Gretel App, NP Referred by: Gretel App, NP  Subjective: Chief Complaint  Patient presents with   Lower Back - Pain   HPI:  Donna Rios is a 73 y.o. female who comes in today at the request of Duwaine Pouch, FNP for planned Left L4-5 Lumbar Transforaminal epidural steroid injection with fluoroscopic guidance.  The patient has failed conservative care including home exercise, medications, time and activity modification.  This injection will be diagnostic and hopefully therapeutic.  Please see requesting physician notes for further details and justification.   ROS Otherwise per HPI.  Assessment & Plan: Visit Diagnoses:    ICD-10-CM   1. Lumbar radiculopathy  M54.16 XR C-ARM NO REPORT    Epidural Steroid injection    methylPREDNISolone  acetate (DEPO-MEDROL ) injection 40 mg      Plan: No additional findings.   Meds & Orders:  Meds ordered this encounter  Medications   methylPREDNISolone  acetate (DEPO-MEDROL ) injection 40 mg    Orders Placed This Encounter  Procedures   XR C-ARM NO REPORT   Epidural Steroid injection    Follow-up: Return for visit to requesting provider as needed.   Procedures: No procedures performed  Lumbosacral Transforaminal Epidural Steroid Injection - Sub-Pedicular Approach with Fluoroscopic Guidance  Patient: Donna Rios      Date of Birth: July 26, 1950 MRN: [id number] PCP: Gretel App, NP      Visit Date: 01/11/2024   Universal Protocol:    Date/Time: 01/11/2024  Consent Given By: the patient  Position: PRONE  Additional Comments: Vital signs were monitored before and after the procedure. Patient was prepped and draped in the usual sterile fashion. The correct patient, procedure, and site was verified.   Injection Procedure Details:   Procedure diagnoses: Lumbar radiculopathy [M54.16]    Meds  Administered:  Meds ordered this encounter  Medications   methylPREDNISolone  acetate (DEPO-MEDROL ) injection 40 mg    Laterality: Left  Location/Site: L4  Needle:5.0 in., 22 ga.  Short bevel or Quincke spinal needle  Needle Placement: Transforaminal  Findings:    -Comments: Excellent flow of contrast along the nerve, nerve root and into the epidural space.  Procedure Details: After squaring off the end-plates to get a true AP view, the C-arm was positioned so that an oblique view of the foramen as noted above was visualized. The target area is just inferior to the nose of the scotty dog or sub pedicular. The soft tissues overlying this structure were infiltrated with 2-3 ml. of 1% Lidocaine  without Epinephrine .  The spinal needle was inserted toward the target using a trajectory view along the fluoroscope beam.  Under AP and lateral visualization, the needle was advanced so it did not puncture dura and was located close the 6 O'Clock position of the pedical in AP tracterory. Biplanar projections were used to confirm position. Aspiration was confirmed to be negative for CSF and/or blood. A 1-2 ml. volume of Isovue -250 was injected and flow of contrast was noted at each level. Radiographs were obtained for documentation purposes.   After attaining the desired flow of contrast documented above, a 0.5 to 1.0 ml test dose of 0.25% Marcaine  was injected into each respective transforaminal space.  The patient was observed for 90 seconds post injection.  After no sensory deficits were reported, and normal lower extremity motor function was noted,   the above injectate was administered  so that equal amounts of the injectate were placed at each foramen (level) into the transforaminal epidural space.   Additional Comments:  The patient tolerated the procedure well Dressing: 2 x 2 sterile gauze and Band-Aid    Post-procedure details: Patient was observed during the procedure. Post-procedure  instructions were reviewed.  Patient left the clinic in stable condition.    Clinical History: MRI LUMBAR SPINE WITHOUT CONTRAST   TECHNIQUE: Multiplanar, multisequence MR imaging of the lumbar spine was performed. No intravenous contrast was administered.   COMPARISON:  08/02/2019 lumbar spine radiographs   FINDINGS: Segmentation:  Standard.   Alignment: Mild lumbar levoscoliosis. 3 mm anterolisthesis of L4 on L5.   Vertebrae: No fracture or suspicious osseous lesion. Mild degenerative endplate edema at U87-O8.   Conus medullaris and cauda equina: Conus extends to the L1-2 level. Conus and cauda equina appear normal.   Paraspinal and other soft tissues: Unremarkable.   Disc levels:   Disc desiccation throughout the lumbar and lower thoracic spine. Mild disc space narrowing at T12-L1, L4-5, and L5-S1.   T11-12: Only imaged sagittally. Mild disc bulging and moderate facet arthrosis without evidence of significant stenosis.   T12-L1: Disc bulging mildly eccentric to the left and mild right and moderate left facet arthrosis without stenosis.   L1-2: Minimal disc bulging and mild facet and ligamentum flavum hypertrophy without stenosis.   L2-3: Mild disc bulging and mild facet and ligamentum flavum hypertrophy without stenosis.   L3-4: Mild disc bulging and moderate facet and ligamentum flavum hypertrophy without stenosis.   L4-5: Anterolisthesis with slight bulging of uncovered disc and severe facet and ligamentum flavum hypertrophy result in moderate to severe spinal stenosis without neural foraminal stenosis.   L5-S1: Disc bulging, endplate spurring, disc space height loss, and moderate left greater than right facet hypertrophy result in mild bilateral neural foraminal stenosis without spinal stenosis.   IMPRESSION: 1. Severe L4-5 facet arthrosis with grade 1 anterolisthesis and moderate to severe spinal stenosis. 2. Mild bilateral neural foraminal stenosis  at L5-S1.     Electronically Signed   By: Dasie Hamburg M.D.   On: 05/01/2020 10:25     Objective:  VS:  HT:    WT:   BMI:     BP:131/84  HR:71bpm  TEMP: ( )  RESP:  Physical Exam Vitals and nursing note reviewed.  Constitutional:      General: She is not in acute distress.    Appearance: Normal appearance. She is not ill-appearing.  HENT:     Head: Normocephalic and atraumatic.     Right Ear: External ear normal.     Left Ear: External ear normal.  Eyes:     Extraocular Movements: Extraocular movements intact.  Cardiovascular:     Rate and Rhythm: Normal rate.     Pulses: Normal pulses.  Pulmonary:     Effort: Pulmonary effort is normal. No respiratory distress.  Abdominal:     General: There is no distension.     Palpations: Abdomen is soft.  Musculoskeletal:        General: Tenderness present.     Cervical back: Neck supple.     Right lower leg: No edema.     Left lower leg: No edema.     Comments: Patient has good distal strength with no pain over the greater trochanters.  No clonus or focal weakness.  Skin:    Findings: No erythema, lesion or rash.  Neurological:     General: No focal deficit  present.     Mental Status: She is alert and oriented to person, place, and time.     Sensory: No sensory deficit.     Motor: No weakness or abnormal muscle tone.     Coordination: Coordination normal.  Psychiatric:        Mood and Affect: Mood normal.        Behavior: Behavior normal.      Imaging: No results found. "

## 2024-01-16 NOTE — Procedures (Signed)
 Lumbosacral Transforaminal Epidural Steroid Injection - Sub-Pedicular Approach with Fluoroscopic Guidance  Patient: Donna Rios      Date of Birth: 1950-11-25 MRN: [id number] PCP: Gretel App, NP      Visit Date: 01/11/2024   Universal Protocol:    Date/Time: 01/11/2024  Consent Given By: the patient  Position: PRONE  Additional Comments: Vital signs were monitored before and after the procedure. Patient was prepped and draped in the usual sterile fashion. The correct patient, procedure, and site was verified.   Injection Procedure Details:   Procedure diagnoses: Lumbar radiculopathy [M54.16]    Meds Administered:  Meds ordered this encounter  Medications   methylPREDNISolone  acetate (DEPO-MEDROL ) injection 40 mg    Laterality: Left  Location/Site: L4  Needle:5.0 in., 22 ga.  Short bevel or Quincke spinal needle  Needle Placement: Transforaminal  Findings:    -Comments: Excellent flow of contrast along the nerve, nerve root and into the epidural space.  Procedure Details: After squaring off the end-plates to get a true AP view, the C-arm was positioned so that an oblique view of the foramen as noted above was visualized. The target area is just inferior to the nose of the scotty dog or sub pedicular. The soft tissues overlying this structure were infiltrated with 2-3 ml. of 1% Lidocaine  without Epinephrine .  The spinal needle was inserted toward the target using a trajectory view along the fluoroscope beam.  Under AP and lateral visualization, the needle was advanced so it did not puncture dura and was located close the 6 O'Clock position of the pedical in AP tracterory. Biplanar projections were used to confirm position. Aspiration was confirmed to be negative for CSF and/or blood. A 1-2 ml. volume of Isovue -250 was injected and flow of contrast was noted at each level. Radiographs were obtained for documentation purposes.   After attaining the desired flow of  contrast documented above, a 0.5 to 1.0 ml test dose of 0.25% Marcaine  was injected into each respective transforaminal space.  The patient was observed for 90 seconds post injection.  After no sensory deficits were reported, and normal lower extremity motor function was noted,   the above injectate was administered so that equal amounts of the injectate were placed at each foramen (level) into the transforaminal epidural space.   Additional Comments:  The patient tolerated the procedure well Dressing: 2 x 2 sterile gauze and Band-Aid    Post-procedure details: Patient was observed during the procedure. Post-procedure instructions were reviewed.  Patient left the clinic in stable condition.

## 2024-01-18 ENCOUNTER — Encounter: Payer: Self-pay | Admitting: Nurse Practitioner

## 2024-01-18 ENCOUNTER — Ambulatory Visit: Admitting: Nurse Practitioner

## 2024-01-18 ENCOUNTER — Other Ambulatory Visit: Payer: Self-pay | Admitting: Cardiovascular Disease

## 2024-01-18 VITALS — BP 136/82 | HR 78 | Temp 98.2°F | Ht 69.0 in | Wt 159.0 lb

## 2024-01-18 DIAGNOSIS — E782 Mixed hyperlipidemia: Secondary | ICD-10-CM

## 2024-01-18 DIAGNOSIS — I10 Essential (primary) hypertension: Secondary | ICD-10-CM

## 2024-01-18 DIAGNOSIS — M5416 Radiculopathy, lumbar region: Secondary | ICD-10-CM | POA: Insufficient documentation

## 2024-01-18 DIAGNOSIS — M48062 Spinal stenosis, lumbar region with neurogenic claudication: Secondary | ICD-10-CM | POA: Insufficient documentation

## 2024-01-18 DIAGNOSIS — Z952 Presence of prosthetic heart valve: Secondary | ICD-10-CM | POA: Diagnosis not present

## 2024-01-18 NOTE — Assessment & Plan Note (Signed)
 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 She will continue Crestor  and is encouraged to make dietary modifications to manage cholesterol.

## 2024-01-18 NOTE — Progress Notes (Signed)
 " Leron Glance, NP-C Phone: [phone]  Donna Rios is a 73 y.o. female who presents today for follow up.   Discussed the use of AI scribe software for clinical note transcription with the patient, who gave verbal consent to proceed.  History of Present Illness   Donna Rios is a 73 year old female who presents for follow up.  She is running low on her Eliquis  prescription, which she takes due to a valve replacement, and has enough to last until next Tuesday morning.  She experiences low back pain and reports pain radiating into her legs. She has received four injections this year. Increased activity helps reduce swelling in her legs and ankles. Follow ing by Orthopedics for lumbar radiculopathy and spinal stenosis.  She takes losartan  for hypertension and monitors her blood pressure daily. Her readings are generally within the range of 120-130/70s, with one instance of 151, which she attributes to stress or activity. She requests more papers to record her blood pressure readings.  She uses a cane for stability, especially on concrete, and is considering getting insoles for her shoes. She has been wearing size 12 shoes due to swelling but is looking for slip-on shoes that are easier to manage.  She experiences occasional dizziness when standing up too quickly and uses a cane for support. She also uses Tiger Balm for back pain relief, which she finds effective.  She takes Crestor  for cholesterol management and is trying to watch her diet. She has been discharged from physical therapy and is doing exercises at home.  She reports occasional indigestion but no chest pain or shortness of breath. She has no family history of breast cancer and is considering delaying her next mammogram.      Tobacco Use History[1]  Medications Ordered Prior to Encounter[2]   ROS see history of present illness  Objective  Physical Exam Vitals:   01/18/24 0926  BP: 136/82  Pulse: 78   Temp: 98.2 F (36.8 C)  SpO2: 99%    BP Readings from Last 3 Encounters:  01/18/24 136/82  01/11/24 131/84  09/12/23 (!) 146/80   Wt Readings from Last 3 Encounters:  01/18/24 159 lb (72.1 kg)  09/12/23 162 lb 12.8 oz (73.8 kg)  08/01/23 161 lb 8 oz (73.3 kg)    Physical Exam Constitutional:      General: She is not in acute distress.    Appearance: Normal appearance.  HENT:     Head: Normocephalic.  Cardiovascular:     Rate and Rhythm: Normal rate and regular rhythm.     Heart sounds: Normal heart sounds.  Pulmonary:     Effort: Pulmonary effort is normal.     Breath sounds: Normal breath sounds.  Skin:    General: Skin is warm and dry.  Neurological:     General: No focal deficit present.     Mental Status: She is alert.  Psychiatric:        Mood and Affect: Mood normal.        Behavior: Behavior normal.      Assessment/Plan: Please see individual problem list.  Primary hypertension Assessment & Plan: Her blood pressure is well-controlled with losartan , with regular home monitoring showing consistent readings in the 120s-130s/70s range. She will continue losartan  for blood pressure management and has been provided with additional blood pressure monitoring sheets.   S/P TAVR (transcatheter aortic valve replacement) Assessment & Plan: She is on chronic anticoagulation with Eliquis  for her mechanical heart valve  replacement and PAF, and has only a week's supply remaining. On chart review, Cardiology refilled medication this morning for her. Continue Eliquis  as prescribed. Follow up with Cardiology as scheduled.    Mixed hyperlipidemia Assessment & Plan: Her mixed hyperlipidemia is managed with Crestor , and she is actively monitoring her diet to manage cholesterol levels. She will continue Crestor  and is encouraged to make dietary modifications to manage cholesterol.   Spinal stenosis of lumbar region with neurogenic claudication Assessment & Plan: She has  chronic lumbar spinal stenosis with radiculopathy and prefers to avoid surgery. Recent lumbar injection with Ortho. Continue current management, follow up as scheduled.       Return in about 6 months (around 07/18/2024) for Follow up.   Leron Glance, NP-C [facility] [place]     [1]  Social History Tobacco Use  Smoking Status Every Day   Current packs/day: 0.25   Average packs/day: 0.5 packs/day for 55.0 years (27.3 ttl pk-yrs)   Types: Cigarettes   Start date: 1971   Passive exposure: Past  Smokeless Tobacco Never  [2]  Current Outpatient Medications on File Prior to Visit  Medication Sig Dispense Refill   acetaminophen  (TYLENOL  8 HOUR ARTHRITIS PAIN) 650 MG CR tablet Take 650 mg by mouth 2 (two) times daily.     Apple Cider Vinegar 500 MG TABS Take 500 mg by mouth daily as needed (with fatty foods).     Calcium -Magnesium -Vitamin D  (CALCIUM  1200+D3 PO) Take 1 tablet by mouth daily.     cetirizine (ZYRTEC) 10 MG tablet Take 10 mg by mouth daily as needed for allergies.     diazepam  (VALIUM ) 5 MG tablet Take one tablet by mouth with light food one hour prior to procedure. 1 tablet 0   docusate sodium  (COLACE) 100 MG capsule Take 1 capsule (100 mg total) by mouth 2 (two) times daily. 10 capsule 0   fluticasone (FLONASE) 50 MCG/ACT nasal spray Place 1 spray into both nostrils daily as needed for allergies.      Lidocaine  4 % SOLN Apply 1 application  topically daily as needed (Knee pain/Back Pain).     losartan  (COZAAR ) 50 MG tablet Take 1 tablet (50 mg total) by mouth daily. 90 tablet 3   methocarbamol  (ROBAXIN ) 500 MG tablet Take 1 tablet (500 mg total) by mouth every 8 (eight) hours as needed for muscle spasms. 30 tablet 2   Multiple Vitamin (MULTIVITAMIN WITH MINERALS) TABS tablet Take 1 tablet by mouth every other day. Woman Centrum 50+     polyethylene glycol (MIRALAX  / GLYCOLAX ) 17 g packet Take 17 g by mouth 2 (two) times daily.     Probiotic Product  (PROBIOTIC ADVANCED PO) Take 1 tablet by mouth every other day.     Propylene Glycol (SYSTANE COMPLETE OP) Place 1 drop into both eyes 3 (three) times daily as needed (dry/irritated eyes.).      rosuvastatin  (CRESTOR ) 40 MG tablet Take 1 tablet (40 mg total) by mouth daily. 90 tablet 3   No current facility-administered medications on file prior to visit.   "

## 2024-01-18 NOTE — Assessment & Plan Note (Signed)
 She is on chronic anticoagulation with Eliquis  for her mechanical heart valve replacement and PAF, and has only a week's supply remaining. On chart review, Cardiology refilled medication this morning for her. Continue Eliquis  as prescribed. Follow up with Cardiology as scheduled.

## 2024-01-18 NOTE — Assessment & Plan Note (Signed)
 She has chronic lumbar spinal stenosis with radiculopathy and prefers to avoid surgery. Recent lumbar injection with Ortho. Continue current management, follow up as scheduled.

## 2024-01-18 NOTE — Telephone Encounter (Signed)
 Prescription refill request for Eliquis  received. Indication:afib Last office visit:7/25 Scr: 0.63  8/25 Age:73 Weight:73.8  kg  Prescription refilled

## 2024-01-18 NOTE — Assessment & Plan Note (Signed)
 Her blood pressure is well-controlled with losartan , with regular home monitoring showing consistent readings in the 120s-130s/70s range. She will continue losartan  for blood pressure management and has been provided with additional blood pressure monitoring sheets.

## 2024-02-14 ENCOUNTER — Ambulatory Visit: Admitting: Nurse Practitioner

## 2024-07-18 ENCOUNTER — Ambulatory Visit: Admitting: Nurse Practitioner
